# Patient Record
Sex: Male | Born: 1970 | Race: White | Hispanic: No | Smoking: Former smoker
Health system: Southern US, Community
[De-identification: ages and names within clinical notes are randomized; demographics above are authoritative.]

## PROBLEM LIST (undated history)

## (undated) DIAGNOSIS — J45909 Unspecified asthma, uncomplicated: Secondary | ICD-10-CM

## (undated) DIAGNOSIS — F32A Depression, unspecified: Secondary | ICD-10-CM

## (undated) DIAGNOSIS — K219 Gastro-esophageal reflux disease without esophagitis: Secondary | ICD-10-CM

## (undated) DIAGNOSIS — M1990 Unspecified osteoarthritis, unspecified site: Secondary | ICD-10-CM

## (undated) DIAGNOSIS — F419 Anxiety disorder, unspecified: Secondary | ICD-10-CM

## (undated) DIAGNOSIS — G4733 Obstructive sleep apnea (adult) (pediatric): Secondary | ICD-10-CM

## (undated) DIAGNOSIS — F99 Mental disorder, not otherwise specified: Secondary | ICD-10-CM

## (undated) DIAGNOSIS — K649 Unspecified hemorrhoids: Secondary | ICD-10-CM

## (undated) DIAGNOSIS — G4700 Insomnia, unspecified: Secondary | ICD-10-CM

## (undated) DIAGNOSIS — M549 Dorsalgia, unspecified: Secondary | ICD-10-CM

## (undated) DIAGNOSIS — R011 Cardiac murmur, unspecified: Secondary | ICD-10-CM

## (undated) DIAGNOSIS — G709 Myoneural disorder, unspecified: Secondary | ICD-10-CM

## (undated) DIAGNOSIS — F329 Major depressive disorder, single episode, unspecified: Secondary | ICD-10-CM

## (undated) DIAGNOSIS — I10 Essential (primary) hypertension: Secondary | ICD-10-CM

## (undated) HISTORY — PX: SPINE SURGERY: SHX786

## (undated) HISTORY — DX: Cardiac murmur, unspecified: R01.1

## (undated) HISTORY — PX: HEMORRHOID SURGERY: SHX153

## (undated) HISTORY — PX: BACK SURGERY: SHX140

## (undated) HISTORY — DX: Unspecified asthma, uncomplicated: J45.909

## (undated) HISTORY — PX: TONSILLECTOMY: SUR1361

---

## 2004-10-18 ENCOUNTER — Encounter: Admission: RE | Admit: 2004-10-18 | Discharge: 2004-10-18 | Payer: Self-pay | Admitting: Neurosurgery

## 2006-09-19 ENCOUNTER — Emergency Department (HOSPITAL_COMMUNITY): Admission: EM | Admit: 2006-09-19 | Discharge: 2006-09-19 | Payer: Self-pay | Admitting: Emergency Medicine

## 2006-09-20 ENCOUNTER — Other Ambulatory Visit (HOSPITAL_COMMUNITY): Admission: RE | Admit: 2006-09-20 | Discharge: 2006-12-19 | Payer: Self-pay | Admitting: Psychiatry

## 2006-09-26 ENCOUNTER — Ambulatory Visit: Payer: Self-pay | Admitting: Psychiatry

## 2006-10-12 ENCOUNTER — Encounter: Admission: RE | Admit: 2006-10-12 | Discharge: 2006-10-12 | Payer: Self-pay | Admitting: Neurosurgery

## 2006-10-28 ENCOUNTER — Emergency Department: Payer: Self-pay | Admitting: Emergency Medicine

## 2006-12-28 ENCOUNTER — Ambulatory Visit: Admission: RE | Admit: 2006-12-28 | Discharge: 2006-12-28 | Payer: Self-pay | Admitting: Neurosurgery

## 2007-01-07 ENCOUNTER — Ambulatory Visit: Payer: Self-pay | Admitting: Cardiovascular Disease

## 2007-01-07 ENCOUNTER — Ambulatory Visit: Payer: Self-pay | Admitting: Internal Medicine

## 2007-02-05 ENCOUNTER — Inpatient Hospital Stay (HOSPITAL_COMMUNITY): Admission: RE | Admit: 2007-02-05 | Discharge: 2007-02-08 | Payer: Self-pay | Admitting: Neurosurgery

## 2007-08-11 ENCOUNTER — Emergency Department: Payer: Self-pay | Admitting: Emergency Medicine

## 2007-08-12 ENCOUNTER — Other Ambulatory Visit: Payer: Self-pay

## 2008-06-12 ENCOUNTER — Encounter: Admission: RE | Admit: 2008-06-12 | Discharge: 2008-06-12 | Payer: Self-pay | Admitting: Neurosurgery

## 2008-06-14 ENCOUNTER — Encounter: Admission: RE | Admit: 2008-06-14 | Discharge: 2008-06-14 | Payer: Self-pay | Admitting: Neurosurgery

## 2009-03-31 ENCOUNTER — Emergency Department (HOSPITAL_COMMUNITY): Admission: EM | Admit: 2009-03-31 | Discharge: 2009-04-01 | Payer: Self-pay | Admitting: Emergency Medicine

## 2009-04-01 ENCOUNTER — Ambulatory Visit: Payer: Self-pay | Admitting: Psychiatry

## 2009-04-01 ENCOUNTER — Inpatient Hospital Stay (HOSPITAL_COMMUNITY): Admission: RE | Admit: 2009-04-01 | Discharge: 2009-04-05 | Payer: Self-pay | Admitting: Psychiatry

## 2010-04-27 ENCOUNTER — Emergency Department (HOSPITAL_COMMUNITY): Admission: EM | Admit: 2010-04-27 | Discharge: 2010-04-30 | Payer: Self-pay | Admitting: Emergency Medicine

## 2010-10-30 ENCOUNTER — Encounter: Payer: Self-pay | Admitting: Neurosurgery

## 2010-10-31 ENCOUNTER — Encounter: Payer: Self-pay | Admitting: Neurosurgery

## 2010-12-24 LAB — CBC
Hemoglobin: 13.3 g/dL (ref 13.0–17.0)
MCH: 30.4 pg (ref 26.0–34.0)
MCHC: 34 g/dL (ref 30.0–36.0)
Platelets: 266 10*3/uL (ref 150–400)

## 2010-12-24 LAB — POCT CARDIAC MARKERS
CKMB, poc: 2 ng/mL (ref 1.0–8.0)
Myoglobin, poc: 66.7 ng/mL (ref 12–200)

## 2010-12-24 LAB — COMPREHENSIVE METABOLIC PANEL
ALT: 25 U/L (ref 0–53)
AST: 23 U/L (ref 0–37)
Albumin: 4.1 g/dL (ref 3.5–5.2)
CO2: 24 mEq/L (ref 19–32)
Calcium: 9.2 mg/dL (ref 8.4–10.5)
Creatinine, Ser: 1.16 mg/dL (ref 0.4–1.5)
GFR calc Af Amer: >60 mL/min (ref >60)
Sodium: 137 mEq/L (ref 135–145)
Total Protein: 7 g/dL (ref 6.0–8.3)

## 2010-12-24 LAB — RAPID URINE DRUG SCREEN, HOSP PERFORMED
Amphetamines: NOT DETECTED
Barbiturates: NOT DETECTED
Benzodiazepines: NOT DETECTED
Tetrahydrocannabinol: POSITIVE — AB

## 2010-12-24 LAB — DIFFERENTIAL
Eosinophils Absolute: 0.4 10*3/uL (ref 0.0–0.7)
Eosinophils Relative: 4 % (ref 0–5)
Lymphocytes Relative: 27 % (ref 12–46)
Lymphs Abs: 2.2 10*3/uL (ref 0.7–4.0)
Monocytes Absolute: 0.7 10*3/uL (ref 0.1–1.0)
Monocytes Relative: 9 % (ref 3–12)

## 2010-12-24 LAB — ETHANOL: Alcohol, Ethyl (B): 5 mg/dL (ref 0–10)

## 2011-01-16 LAB — DIFFERENTIAL
Lymphocytes Relative: 26 % (ref 12–46)
Monocytes Absolute: 0.7 10*3/uL (ref 0.1–1.0)
Monocytes Relative: 8 % (ref 3–12)
Neutro Abs: 5.4 10*3/uL (ref 1.7–7.7)

## 2011-01-16 LAB — RAPID URINE DRUG SCREEN, HOSP PERFORMED
Opiates: NOT DETECTED
Tetrahydrocannabinol: NOT DETECTED

## 2011-01-16 LAB — HEPATIC FUNCTION PANEL
ALT: 40 U/L (ref 0–53)
Alkaline Phosphatase: 48 U/L (ref 39–117)
Bilirubin, Direct: <0.1 mg/dL (ref 0.0–0.3)
Total Bilirubin: 0.2 mg/dL — ABNORMAL LOW (ref 0.3–1.2)

## 2011-01-16 LAB — URINE MICROSCOPIC-ADD ON

## 2011-01-16 LAB — POCT I-STAT, CHEM 8
BUN: 21 mg/dL (ref 6–23)
Calcium, Ion: 0.94 mmol/L — ABNORMAL LOW (ref 1.12–1.32)
Chloride: 104 mEq/L (ref 96–112)
Creatinine, Ser: 1.4 mg/dL (ref 0.4–1.5)
Glucose, Bld: 116 mg/dL — ABNORMAL HIGH (ref 70–99)

## 2011-01-16 LAB — URINALYSIS, ROUTINE W REFLEX MICROSCOPIC
Glucose, UA: NEGATIVE mg/dL
Hgb urine dipstick: NEGATIVE
Protein, ur: 100 mg/dL — AB

## 2011-01-16 LAB — CBC
HCT: 43.7 % (ref 39.0–52.0)
Hemoglobin: 15.2 g/dL (ref 13.0–17.0)
RBC: 4.93 MIL/uL (ref 4.22–5.81)

## 2011-01-16 LAB — ETHANOL: Alcohol, Ethyl (B): 75 mg/dL — ABNORMAL HIGH (ref 0–10)

## 2011-02-21 NOTE — H&P (Signed)
NAMEHUIE, GHUMAN              ACCOUNT NO.:  [id number]   MEDICAL RECORD NO.:  [id number]          PATIENT TYPE:  IPS   LOCATION:  0503                          FACILITY:  [facility]   PHYSICIAN:  Geoffery Lyons, M.D.      DATE OF BIRTH:  [birth year]   DATE OF ADMISSION:  04/01/2009  DATE OF DISCHARGE:                       PSYCHIATRIC ADMISSION ASSESSMENT   A 40 year old male voluntarily admitted on March 31, 2009.   HISTORY OF PRESENT ILLNESS:  The patient reports wanting to get help  from his alcohol and cocaine use.  He had some vague and passive  suicidal thoughts.  He is unable to recall his longest history of  sobriety.  He states he has tried to detox on his own, but it was  miserable.   PAST PSYCHIATRIC HISTORY:  First admission to [facility].  No  prior detox.  The patient is hoping to go to a long-term program.   SOCIAL HISTORY:  This is a 40 year old married male.  He is unemployed.  Apparently wife had asked him to leave due to his substance use and some  apparent financial problems.   FAMILY HISTORY:  Unknown at this time.   ALCOHOL/DRUG HISTORY:  Again, the patient has been drinking.  Drinking  approximately a pint to some liters of wine and using cocaine.   PRIMARY CARE Manda Holstad:  Dr. Anna Genre.   MEDICAL PROBLEMS:  1. Hypertension.  2. Chronic pain.   MEDICATIONS:  1. Lisinopril.  2. Prozac 20 mg daily.  3. Percocet for pain.   DRUG ALLERGIES:  NO KNOWN ALLERGIES.   PHYSICAL EXAMINATION:  GENERAL:  This is a middle-aged male fully  assessed at [facility] Emergency Department.  ER records state that  patient was agitated.  He did receive some Ativan.  Also noted that the  patient apparently was threatening and violent.  A friend had called the  police.  Was taken to the [facility] and then transported to the  emergency room.  VITAL SIGNS:  98.4, 109 heart rate, blood pressure 142/91.   LABORATORY DATA:  Alcohol level of 75.  CBC within normal  limits.  Urine  drug screen is positive for cocaine.  Sodium of 133, potassium of 3.2.   MENTAL STATUS EXAM:  The patient is in bed.  Poor eye contact.  He is  dressed in scrubs, irritable.  The patient is trying to sleep.  Speech  is clear.  The patient's mood is tired.  As stated, the patient is  irritable, wanting to sleep, but nonthreatening or hostile.  Thought  processes are coherent and goal directed.  No delusional statements.  Cognitive function intact.  Memory appears intact.  Judgment and insight  are fair.   AXIS I:  Polysubstance abuse, mood disorder not otherwise specified.  AXIS II:  Deferred.  AXIS III:  Hypertension, chronic pain.  AXIS IV:  Medical problems, economic issues, possible occupation  problems and  problems with primary support.  AXIS V:  Current is 45.   PLAN:  Our plan is to detox the patient with the Librium protocol.  Work  on  relapse prevention. Case manager will assess long-term rehab which  the patient is interested in.  We will discontinue his Percocet at this  time and offer Motrin for pain.  The patient will be in the red group.  Tentative length of stay at this time is 3-4 days.      Landry Corporal, N.P.      Geoffery Lyons, M.D.  Electronically Signed    JO/MEDQ  D:  04/01/2009  T:  04/01/2009  Job:  [id number]

## 2011-02-24 NOTE — Discharge Summary (Signed)
Bryan Huffman, Bryan Huffman              ACCOUNT NO.:  [id number]   MEDICAL RECORD NO.:  [id number]          PATIENT TYPE:  IPS   LOCATION:  0503                          FACILITY:  [facility]   PHYSICIAN:  Geoffery Lyons, M.D.      DATE OF BIRTH:  1971-05-05   DATE OF ADMISSION:  04/01/2009  DATE OF DISCHARGE:  04/05/2009                               DISCHARGE SUMMARY   CHIEF COMPLAINT/HISTORY OF PRESENT ILLNESS:  This was the first  admission to [facility] for this 40 year old male  voluntarily admitted.  He reported wanting to get help for his alcohol  and cocaine use.  He had some vague and passive thoughts of suicide,  unable to recall his longest history of sobriety.  He tried to detox  from home but he said he was miserable.   PAST PSYCHIATRIC HISTORY:  History of first time at [facility].  No prior detox.  Hoping to go to a long-term program.  He endorsed he  has been drinking a pint to some liters of wine and using cocaine.   PAST MEDICAL HISTORY:  Hypertension.   MEDICATIONS:  1. Chronic pain medication.  2. Lisinopril.  3. Prozac 20 mg per day.  4. Percocet for pain.   Physical exam failed to show any acute findings.   LABORATORY WORKUP:  SGOT 23, SGPT 40, total bilirubin 0.2.  Alcohol  level 75.  CBC within normal limits.  UDS positive for cocaine.  Sodium  133, potassium 3.2.   PHYSICAL EXAMINATION:  GENERAL:  Reveals alert, cooperative male, poor  eye contact, somewhat irritable, trying to sleep.  Speech is clear,  normal rate, tempo and production.  Mood tired.  Affect irritable,  wanting to sleep.  Thought processes logical, coherent and relevant.  __________  spontaneous.  Continued to endorse he wanted help.  No  suicidal or homicidal ideas, no delusions.  No hallucinations.  Cognition well-preserved.   DIAGNOSES:  AXIS I:  1. Alcohol dependence.  2. Cocaine dependence.  3. Mood disorder, not otherwise specified.Marland Kitchen  AXIS II: No diagnosis.  AXIS III:  1. Hypertension.  2. Chronic pain.  AXIS IV: Moderate.  AXIS V:  On admission 35, highest GAF in the last year 60.   He again was on Zestril 10 mg per day, Prozac 20 mg per day, and  Percocet 1-2 325 every hour as needed for pain.  He claimed he went out  on the beach with alcohol as well as crack cocaine.  During an argument  with his wife police were called.   PAST SURGICAL HISTORY:  Had two back surgeries.   SOCIAL HISTORY:  He is a Archivist.  He used to work in the [employer] and transferred to another division where he had to use his  back that has made things worse for him.  He endorsed that the wife was  leaving  as a result of his alcohol and cocaine use.  He wanted to go to  residential treatment facility.  On June 25 he was having a very hard  time with his  mood, with dealing with a lot of anxiety and depression  and getting __________with the shame and guilt for what he has done to  his family.  That is the lying, the deceiving, the manipulation.  He  endorsed that he does not know what happened to him.  He used to be so  happy and that he is reduced to this.  We used Seroquel and Neurontin  with the anxiety and the agitation.  Sleep was an issue as well as mild  hand tremor.  We continued to work on Pharmacologist, relapse prevention.  Reasons were made for him to be admitted to [facility] of [place],  [place].  He was motivated to this.  His mood improved.  His affect was  brighter.  He was committed to abstinence so on June 28 he was  discharged to go to [place], [place] to further work on his recovery.   DISCHARGE DIAGNOSES:  AXIS I:  1. Alcohol and cocaine dependence.  2. Mood disorder, not otherwise specified.  AXIS II: No diagnosis.  AXIS III:  1. Severe hypertension.  2. Chronic pain.  AXIS IV: Moderate.  AXIS V:  Upon discharge 50-55.   MEDICATIONS:  1. Discharged on Prozac 20 mg per day.  2. Seroquel 50 mg three times a day and 100  at bedtime.  3. Neurontin 300 mg four times a day.  4. Zestril 20 mg per day.   FOLLOWUP:  Follow up through [facility] in [place], M.D.  Electronically Signed     IL/MEDQ  D:  05/05/2009  T:  05/05/2009  Job:  [id number]

## 2011-02-24 NOTE — Letter (Signed)
January 07, 2007    Payton Doughty, M.D.  [address], [address]  [place], [place] [address]   RE:  Bryan Huffman, Bryan Huffman  MRN:  [id number]  /  DOB:  1971/06/18   Dear Loraine Leriche:   Today, I saw Mr. 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  The patient  denies any history of palpitations or heart racing and has never had  syncope.  He does have chronic lower back pain and note your plans for  pending surgery.   I have discussed the issues of back surgery with the patient in detail.  Because he is asymptomatic with regard to his accessory pathway and WPW  syndrome, I have not recommended anything more than beta-blocker  therapy.  He will be given a prescription today for Lopressor 25 mg  twice daily which he is instructed to take for approximately 1 week from  today before undergoing back surgery.  Once this has been carried out  and, if need be, 5 days alone would be enough, then it would be okay to  proceed with lower back surgery with the reinitiation of his beta-  blocker therapy once he is able to take p.o.  There is a small chance  that he might develop atrial fibrillation and a rapid ventricular  response from his WPW syndrome and  if he did, cardiology consultation, specifically electrophysiology  consultation would be warranted.  My expectation, however, is that he  will do well particularly provided that he is maintained on his beta-  blocker therapy.  Do not hesitate to contact me should you have any  additional questions regarding Mr. Angerer perioperative evaluation or  treatment with this WPW syndrome.    Sincerely,      Doylene Canning. Ladona Ridgel, MD  Electronically Signed    GWT/MedQ  DD: 01/07/2007  DT:  01/08/2007  Job #: [id number]

## 2011-02-24 NOTE — H&P (Signed)
Bryan Huffman, Bryan Huffman              ACCOUNT NO.:  [id number]   MEDICAL RECORD NO.:  [id number]          PATIENT TYPE:  INP   LOCATION:  2899                         FACILITY:  [facility]   PHYSICIAN:  Payton Doughty, M.D.      DATE OF BIRTH:  08/21/1971   DATE OF ADMISSION:  02/05/2007  DATE OF DISCHARGE:                              HISTORY & PHYSICAL   ADMITTING DIAGNOSIS:  Lumbar spondylosis L3-4.   HISTORY OF PRESENT ILLNESS:  A 40 year old right-handed white gentleman  who in 1998 underwent L4-5 fusion by Dr. Renae Fickle for spondylosis with a  slip.  He did well since then.  Starting in September, experiencing low  back pain radiating into his right hip and buttock, worse on the right,  getting into the left.  Bowel and bladder are not noted to be a  difficulty.  He has been dealing with this for a couple of years.  Repeat MR shows worsening stenosis at L3-4, the level above his fusion.  He is now admitted here for extension of his fusion up from 4-5 to 3-4.   PAST MEDICAL HISTORY:  Remarkable for hypertension.   MEDICATIONS:  He takes Vicodin on a p.r.n. basis, as well as lisinopril.   ALLERGIES:  BEE STINGS.   PAST SURGICAL HISTORY:  His only operations are in his back.   SOCIAL HISTORY:  He smokes a pack of cigarettes per day.  Drinks alcohol  on a limited social basis.  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   FAMILY HISTORY:  Mom and dad are both in their mid 65s in good health.   REVIEW OF SYSTEMS:  Remarkable for nasal congestion, hypertension, back  pain, and leg pain.  HEENT EXAM:  Within normal limits.  He has  reasonable range of motion in his neck.  CHEST:  Clear.  CARDIAC EXAM:  Regular rate and rhythm.  ABDOMEN:  Nontender.  No hepatosplenomegaly.  EXTREMITIES:  Without clubbing or cyanosis.  GU EXAM:  Deferred.  Peripheral pulses are good.  NEUROLOGICALLY:  He is awake, alert, and  oriented.  His cranial nerves are intact.  Motor exam shows 5/5 strength  throughout to upper and lower extremities.  No current sensory deficit.  Sensory described as an S1 distribution with mild L5 component.  Reflexes are absent at the knees, one at the left ankle, absent at the  right ankle.  Straight-leg raise is negative.  CT demonstrates good  fusion in 4-5.  On his MR, is shows tight stenosis at 3-4 with  significant degenerative disease at facets.   CLINICAL IMPRESSION:  Significant lumbar spondylosis and transitional  syndrome at L3-4.   PLAN:  Plan is for extension of fusion up to L3-4.  Will place cages at  that level as well.  The risks and benefits of this approach have been  discussed with him and he wishes to proceed.           ______________________________  Payton Doughty, M.D.     MWR/MEDQ  D:  02/05/2007  T:  02/05/2007  Job:  [id number]

## 2011-02-24 NOTE — Discharge Summary (Signed)
NAMEMAXAMUS, COLAO              ACCOUNT NO.:  [id number]   MEDICAL RECORD NO.:  [id number]          PATIENT TYPE:  INP   LOCATION:  3023                         FACILITY:  [facility]   PHYSICIAN:  Payton Doughty, M.D.      DATE OF BIRTH:  November 01, 1970   DATE OF ADMISSION:  02/05/2007  DATE OF DISCHARGE:  02/08/2007                               DISCHARGE SUMMARY   ADMISSION DIAGNOSIS:  Spondylosis L3-4.   DISCHARGE DIAGNOSIS:  Spondylosis L3-4.   PROCEDURE:  L3-4, L4-5 extension fusion from L4-5 up to L3-4.   COMPLICATIONS:  None.   HISTORY OF PRESENT ILLNESS:  A 40 year old gentleman whose history and  physical was reviewed in the chart.  He has had prior fusion 4-5, has  progressive disease at 3-4.  Medical history is otherwise benign.   PHYSICAL EXAMINATION:  GENERAL:  Intact.  NEUROLOGIC:  Intact with L4 radiculopathy.   HOSPITAL COURSE:  He was admitted after ascertaining abnormal laboratory  values and underwent a fusion at 3-4 and extension of his fusion up from  4-5.  Postoperatively he has done well.  Foley was removed the first  day.  PCA was stopped the second day.  It is now the third postoperative  day.  He is being discharged home in the care of his family with valium  for spasm, Percocet for pain, and 5 days of ciprofloxacin.   FOLLOWUP:  His followup will be with [facility] in  [facility] office in 1 week.           ______________________________  Payton Doughty, M.D.     MWR/MEDQ  D:  02/08/2007  T:  02/09/2007  Job:  [id number]

## 2011-02-24 NOTE — Letter (Signed)
January 02, 2007    Payton Doughty, M.D.  [address]  [address]  [place], [place] [address]   RE:  MACKLIN, JACQUIN  MRN:  [id number]  /  DOB:  15-Mar-1971   Dear Dr. Channing Mutters:   It was my pleasure to see Nivan Melendrez at the [facility]  [facility] on January 02, 2007.  He is a 40 year old male who was referred for  evaluation of an abnormal EKG.   Mr. Dilauro has no history of cardiac problems.  He was undergoing  routine preoperative evaluation for upcoming lumbar back surgery, and  was found to have an EKG pattern suggestive of Wolff-Parkinson-White  syndrome.  He does not recall ever having an EKG performed in the past.  He has never been told of this diagnosis.  He has never been told of any  heart problems.  He has been active gentleman until his back problems,  and has never had any chest pain, dyspnea, lightheadedness, syncope,  palpitations, orthopnea, PND, edema, or other cardiac problems.  Other  than back pain with some radiation to the legs, he has no symptomatic  complaints at present.   PAST MEDICAL HISTORY:  Pertinent for the following:  1. Essential hypertension.  2. Chronic low back pain.  3. Lumbar fusion in 1998.   No other chronic medical problems.   CURRENT MEDICATIONS:  Include:  1. Lisinopril 10 mg daily.  2. Percocet 10/650 mg as needed.  3. Over-the-counter sleep medication as needed.  4. Multivitamin daily.   ALLERGIES:  NO KNOWN DRUG ALLERGIES.   SOCIAL HISTORY:  The patient is married.  He has 1 child.  He works as a  Investment banker, corporate.  He smokes 1 pack of cigarettes over the last 10  years.  He has used illicit drugs in the past, but not presently.  He  drinks a few alcoholic drinks every week.  He does not exercise  regularly.   FAMILY HISTORY:  There is no family history of coronary artery disease  or sudden death in the family.   REVIEW OF SYSTEMS:  Complete 12-point review of systems was performed.  All systems were negative except as  detailed above.   PHYSICAL EXAMINATION:  The patient is alert and oriented.  He is in no  acute distress.  Weight is 155 pounds.  Blood pressure is 130/79 in the right arm, 144/85  in the left arm.  Heart rate is 73.  Respiratory rate 16.  HEENT:  Normal.  NECK:  Normal carotid upstrokes without bruits.  Jugular venous pressure  is normal.  There is no thyromegaly or thyroid nodules.  LUNGS:  Clear to auscultation bilaterally.  HEART:  The apex is discrete and nondisplaced.  Heart is regular rate  and rhythm without murmurs or gallops.  ABDOMEN:  Soft, non-tender.  No organomegaly.  No abdominal bruits.  BACK:  There is no flank tenderness.  EXTREMITIES:  No clubbing, cyanosis, or edema.  Peripheral pulses are 2+  and equal throughout.  SKIN:  Warm and dry without rash.  NEUROLOGIC:  Cranial nerves 2 through 12 are intact.  Strength is 5/5  and equal in the arms and legs bilaterally.   EKG dated December 28, 2006 demonstrates normal sinus rhythm with typical  features of ventricular preexcitation with a delta wave and short PR  interval.  EKG performed in our office shows normal sinus rhythm with  classic Wolff-Parkinson-White pattern.  There is an R wave in V1  along  with delta waves and the short PR interval.   ASSESSMENT:  Mr. Jacob is a 40 year old gentleman for upcoming  repeat lumbar fusion surgery for chronic back problems.  He clearly  meets criteria for Wolff-Parkinson-White syndrome based on his classic  ECG.  He has never had any symptomatic arrhythmias or cardiac problems  for that matter.  I think his risk of undergoing general anesthesia for  his upcoming surgery is very low.  A question of whether he should have  a formal electrophysiology evaluation since ablation procedures can  provide definitive cure of WPW as well as reduce the risk of future  arrhythmias.  I will review this with either Dr. Ladona Ridgel or Dr. Graciela Husbands  prior his final clearance for surgery.  I will be  in contact with you as  soon as I am able to do this, but should be able to clear him within the  week.   Dr. Channing Mutters, thanks again for allowing me to see Mr. Roskos.  I will be  in touch with you in the next few days to give you any final  recommendations.  Mr. Mangrum' Lisinopril was refilled, as he has lost  his primary care physician.  We will give him a primary care referral as  well.    Sincerely,      Veverly Fells. Excell Seltzer, MD  Electronically Signed    MDC/MedQ  DD: 01/02/2007  DT: 01/02/2007  Job #: [id number]

## 2011-02-24 NOTE — Assessment & Plan Note (Signed)
[facility]                         ELECTROPHYSIOLOGY OFFICE NOTE   JAYSHAWN, COLSTON                     MRN:          [id number]  DATE:01/07/2007                            DOB:          [birth year]    Mr. Byers was referred today by Dr. Trey Sailors secondary to WPW  syndrome.   HISTORY OF PRESENT ILLNESS:  Patient is a 40 year old man who has had  fairly good health from a cardiac perspective.  He smokes cigarettes and  drinks alcoholic beverages, although not in excess.  The patient has no  significant palpitations and denies any history of rapid heart racing.  He does have problems with his lumbar and sacral spine and was scheduled  for an operation last week when he was subsequently found to have an  abnormal EKG with clear cut WPW syndrome.  As noted before, however, the  patient has had no symptoms denying syncope or other problems.  He  subsequently was initially seen by Dr. Calton Dach.  He is now referred  for additional evaluation.   His additional past medical history is notable for prior lumbar fusion  in 1998.   SOCIAL HISTORY:  The patient smokes one pack of cigarettes per day.  He  drinks 1-2 alcoholic beverages per day.  Works as a Archivist.   FAMILY HISTORY:  Notable for both parents being alive and well.  His  father does have some coronary disease.   Medications include Percocet, lisinopril, and over-the-counter sleep  aids.   Review of systems is notable for problems with chronic back pain for  many years.  He also complains of depression and anxiety and has  problems with gastroesophageal reflux disease and seasonal allergic  rhinitis.  He denies cough or hemoptysis.  The rest of his review of  systems was negative, except as noted above.   PHYSICAL EXAMINATION:  GENERAL:  He is a pleasant, well-appearing 11-  year-old man in no acute distress.  VITAL SIGNS:  Blood pressure today was 140/80.  Pulse was 73  and  regular.  Respirations were 18.  The weight was 155 pounds.  HEENT:  Normocephalic and atraumatic.  Pupils are equal and round.  Oropharynx was moist.  Sclerae are anicteric.  NECK:  No jugular venous distention.  There was no thyromegaly.  Trachea  is midline.  The carotids are 2+ and symmetric.  LUNGS:  Clear bilaterally to auscultation.  There are no wheezes, rales  or rhonchi.  CARDIOVASCULAR:  Regular rate and rhythm with a normal S1 and S2.  EXTREMITIES:  No edema.  ABDOMEN:  Soft, nontender, nondistended.  There is no organomegaly.  JOINTS:  Normal.  NEUROLOGIC:  Alert and oriented x3 with cranial nerves intact.  The  strength is 5/5 and symmetric.   EKG demonstrates sinus rhythm with left lateral accessory pathway and  WPW syndrome.   IMPRESSION:  1. Asymptomatic Wolff-Parkinson-White syndrome.  2. Borderline hypertension.   DISCUSSION:  The risk of the surgery is low for Mr. Mickeal Needy.  Because  he is asymptomatic from his WPW syndrome, I have recommended a period of  watchful waiting.  If he were to develop supraventricular tachycardia or  atrial fibrillation, then he would be a very good candidate for catheter  ablation therapy.     Doylene Canning. Ladona Ridgel, MD  Electronically Signed    GWT/MedQ  DD: 01/07/2007  DT: 01/08/2007  Job #: [id number]   cc:   Payton Doughty, M.D.

## 2011-02-24 NOTE — Op Note (Signed)
[name], Bryan Huffman              ACCOUNT NO.:  [id number]   MEDICAL RECORD NO.:  [id number]          PATIENT TYPE:  INP   LOCATION:  3023                         FACILITY:  [facility]   PHYSICIAN:  Payton Doughty, M.D.      DATE OF BIRTH:  [birth year]   DATE OF PROCEDURE:  02/05/2007  DATE OF DISCHARGE:                               OPERATIVE REPORT   PREOPERATIVE DIAGNOSIS:  Spondylosis L3-4.   POSTOPERATIVE DIAGNOSIS:  Spondylosis L3-4.   OPERATIVE PROCEDURE:  L3-4 laminectomy, diskectomy, posterior lumbar  antibody fusion with Ray threaded fusion cage, segmental pedicle screw  fixation from L3-L5 and posterolateral arthrodesis L3-4.   ANESTHESIA:  General endotracheal.   PREP:  Sterile Betadine prep and scrub with alcohol wipe.   COMPLICATIONS:  None.   NURSE ASSISTANT:  [place].   DOCTOR ASSISTANT:  Hewitt Shorts, M.D.   BODY OF TEXT:  This is a 40 year old gentleman with severe lumbar  spondylosis L3-4 which is a level above an old fusion at L4-5.  Taken  operating room, smoothly anesthetized, intubated, placed prone on the  operating table following shave, prep, draped in usual sterile fashion.  The skin was incised from mid L2 to the bottom of L4.  The lamina and  transverse process of L3 was exposed as well as the old hardware at L4-  5.  It was uncovered and removed.  It was replaced with 90 D pedicle  screws.  The lamina, pars interarticularis and inferior facet of L3 and  superior facet of L4 removed using a high-speed drill and L3 roots  decompressed.  The right-sided root was quite large and was not feasible  to be mobilized.  The left-sided root was more with normal.  On the left  side, a Ray threaded fusion cage was placed 12 x 21 mm packed with bone  graft harvested from the facet joint.  The L3 pedicle was then tapped  and pedicle screws placed.  The intraoperative x-ray showed good  placement screws.  It was connected to the rods.  The transverse  processes were decorticated with a high-speed drill and packed with bone  graft harvested from facet joints as well as BMP in the extender matrix.  Intraoperative film showed good placement of all hardware.  Successive  layers of 0-0 Vicryl, 2-0 Vicryl and 3-0 nylon were used to close.  Betadine Telfa dressing was applied and made occlusive with OpSite.  The  patient returned to recovery room in good condition.           ______________________________  Payton Doughty, M.D.     MWR/MEDQ  D:  02/05/2007  T:  02/06/2007  Job:  [id number]

## 2012-10-10 ENCOUNTER — Ambulatory Visit: Payer: Self-pay | Admitting: Nurse Practitioner

## 2013-05-18 ENCOUNTER — Ambulatory Visit: Payer: Self-pay | Admitting: Nurse Practitioner

## 2013-06-26 ENCOUNTER — Ambulatory Visit: Payer: Self-pay | Admitting: Nurse Practitioner

## 2013-07-25 ENCOUNTER — Encounter (HOSPITAL_COMMUNITY): Payer: Self-pay | Admitting: Emergency Medicine

## 2013-07-25 ENCOUNTER — Encounter (HOSPITAL_COMMUNITY): Payer: Self-pay | Admitting: *Deleted

## 2013-07-25 ENCOUNTER — Emergency Department (HOSPITAL_COMMUNITY)
Admission: EM | Admit: 2013-07-25 | Discharge: 2013-07-25 | Disposition: A | Discharge status: Other Acute Inpatient Hospital | Payer: Medicaid Other | Attending: Emergency Medicine | Admitting: Emergency Medicine

## 2013-07-25 ENCOUNTER — Inpatient Hospital Stay (HOSPITAL_COMMUNITY)
Admission: AD | Admit: 2013-07-25 | Discharge: 2013-07-30 | DRG: 897 | Disposition: A | Discharge status: Home / Self Care | Payer: Medicaid Other | Source: Intra-hospital | Attending: Psychiatry | Admitting: Psychiatry

## 2013-07-25 DIAGNOSIS — T43502A Poisoning by unspecified antipsychotics and neuroleptics, intentional self-harm, initial encounter: Secondary | ICD-10-CM | POA: Insufficient documentation

## 2013-07-25 DIAGNOSIS — I959 Hypotension, unspecified: Secondary | ICD-10-CM

## 2013-07-25 DIAGNOSIS — T50902A Poisoning by unspecified drugs, medicaments and biological substances, intentional self-harm, initial encounter: Secondary | ICD-10-CM

## 2013-07-25 DIAGNOSIS — Z79899 Other long term (current) drug therapy: Secondary | ICD-10-CM

## 2013-07-25 DIAGNOSIS — I10 Essential (primary) hypertension: Secondary | ICD-10-CM | POA: Diagnosis present

## 2013-07-25 DIAGNOSIS — R45851 Suicidal ideations: Secondary | ICD-10-CM | POA: Insufficient documentation

## 2013-07-25 DIAGNOSIS — F1020 Alcohol dependence, uncomplicated: Principal | ICD-10-CM | POA: Diagnosis present

## 2013-07-25 DIAGNOSIS — IMO0002 Reserved for concepts with insufficient information to code with codable children: Secondary | ICD-10-CM | POA: Insufficient documentation

## 2013-07-25 DIAGNOSIS — M549 Dorsalgia, unspecified: Secondary | ICD-10-CM | POA: Diagnosis present

## 2013-07-25 DIAGNOSIS — F329 Major depressive disorder, single episode, unspecified: Secondary | ICD-10-CM

## 2013-07-25 DIAGNOSIS — F17200 Nicotine dependence, unspecified, uncomplicated: Secondary | ICD-10-CM | POA: Insufficient documentation

## 2013-07-25 DIAGNOSIS — R5381 Other malaise: Secondary | ICD-10-CM | POA: Insufficient documentation

## 2013-07-25 DIAGNOSIS — Z791 Long term (current) use of non-steroidal anti-inflammatories (NSAID): Secondary | ICD-10-CM | POA: Insufficient documentation

## 2013-07-25 DIAGNOSIS — T50992A Poisoning by other drugs, medicaments and biological substances, intentional self-harm, initial encounter: Secondary | ICD-10-CM | POA: Insufficient documentation

## 2013-07-25 DIAGNOSIS — T426X2A Poisoning by other antiepileptic and sedative-hypnotic drugs, intentional self-harm, initial encounter: Secondary | ICD-10-CM | POA: Insufficient documentation

## 2013-07-25 DIAGNOSIS — T1491XA Suicide attempt, initial encounter: Secondary | ICD-10-CM

## 2013-07-25 DIAGNOSIS — F332 Major depressive disorder, recurrent severe without psychotic features: Secondary | ICD-10-CM | POA: Diagnosis present

## 2013-07-25 DIAGNOSIS — G4700 Insomnia, unspecified: Secondary | ICD-10-CM | POA: Insufficient documentation

## 2013-07-25 DIAGNOSIS — T465X1A Poisoning by other antihypertensive drugs, accidental (unintentional), initial encounter: Secondary | ICD-10-CM | POA: Insufficient documentation

## 2013-07-25 DIAGNOSIS — T4272XA Poisoning by unspecified antiepileptic and sedative-hypnotic drugs, intentional self-harm, initial encounter: Secondary | ICD-10-CM | POA: Insufficient documentation

## 2013-07-25 DIAGNOSIS — R000 Tachycardia, unspecified: Secondary | ICD-10-CM | POA: Insufficient documentation

## 2013-07-25 DIAGNOSIS — T43294A Poisoning by other antidepressants, undetermined, initial encounter: Secondary | ICD-10-CM | POA: Insufficient documentation

## 2013-07-25 DIAGNOSIS — T426X1A Poisoning by other antiepileptic and sedative-hypnotic drugs, accidental (unintentional), initial encounter: Secondary | ICD-10-CM | POA: Insufficient documentation

## 2013-07-25 HISTORY — DX: Major depressive disorder, single episode, unspecified: F32.9

## 2013-07-25 HISTORY — DX: Mental disorder, not otherwise specified: F99

## 2013-07-25 HISTORY — DX: Essential (primary) hypertension: I10

## 2013-07-25 HISTORY — DX: Insomnia, unspecified: G47.00

## 2013-07-25 HISTORY — DX: Dorsalgia, unspecified: M54.9

## 2013-07-25 HISTORY — DX: Depression, unspecified: F32.A

## 2013-07-25 LAB — COMPREHENSIVE METABOLIC PANEL
ALT: 52 U/L (ref 0–53)
AST: 61 U/L — ABNORMAL HIGH (ref 0–37)
Albumin: 3.4 g/dL — ABNORMAL LOW (ref 3.5–5.2)
Alkaline Phosphatase: 54 U/L (ref 39–117)
BUN: 29 mg/dL — ABNORMAL HIGH (ref 6–23)
CO2: 20 mEq/L (ref 19–32)
Calcium: 8.6 mg/dL (ref 8.4–10.5)
Chloride: 99 mEq/L (ref 96–112)
Creatinine, Ser: 1.22 mg/dL (ref 0.50–1.35)
GFR calc Af Amer: 82 mL/min — ABNORMAL LOW (ref >90)
GFR calc non Af Amer: 71 mL/min — ABNORMAL LOW (ref >90)
Glucose, Bld: 152 mg/dL — ABNORMAL HIGH (ref 70–99)
Potassium: 3.3 mEq/L — ABNORMAL LOW (ref 3.5–5.1)
Sodium: 134 mEq/L — ABNORMAL LOW (ref 135–145)
Total Bilirubin: 0.1 mg/dL — ABNORMAL LOW (ref 0.3–1.2)
Total Protein: 6.4 g/dL (ref 6.0–8.3)

## 2013-07-25 LAB — CBC WITH DIFFERENTIAL/PLATELET
Basophils Absolute: 0 10*3/uL (ref 0.0–0.1)
Basophils Relative: 0 % (ref 0–1)
Eosinophils Absolute: 0 10*3/uL (ref 0.0–0.7)
Eosinophils Relative: 0 % (ref 0–5)
HCT: 37.7 % — ABNORMAL LOW (ref 39.0–52.0)
Hemoglobin: 12.7 g/dL — ABNORMAL LOW (ref 13.0–17.0)
Lymphocytes Relative: 9 % — ABNORMAL LOW (ref 12–46)
Lymphs Abs: 1.4 10*3/uL (ref 0.7–4.0)
MCH: 30.4 pg (ref 26.0–34.0)
MCHC: 33.7 g/dL (ref 30.0–36.0)
MCV: 90.2 fL (ref 78.0–100.0)
Monocytes Absolute: 0.6 10*3/uL (ref 0.1–1.0)
Monocytes Relative: 4 % (ref 3–12)
Neutro Abs: 13.9 10*3/uL — ABNORMAL HIGH (ref 1.7–7.7)
Neutrophils Relative %: 87 % — ABNORMAL HIGH (ref 43–77)
Platelets: 245 10*3/uL (ref 150–400)
RBC: 4.18 MIL/uL — ABNORMAL LOW (ref 4.22–5.81)
RDW: 14.1 % (ref 11.5–15.5)
WBC: 15.9 10*3/uL — ABNORMAL HIGH (ref 4.0–10.5)

## 2013-07-25 LAB — URINALYSIS, ROUTINE W REFLEX MICROSCOPIC
Bilirubin Urine: NEGATIVE
Glucose, UA: 250 mg/dL — AB
Hgb urine dipstick: NEGATIVE
Ketones, ur: NEGATIVE mg/dL
Leukocytes, UA: NEGATIVE
Nitrite: NEGATIVE
Protein, ur: NEGATIVE mg/dL
Specific Gravity, Urine: 1.021 (ref 1.005–1.030)
Urobilinogen, UA: 0.2 mg/dL (ref 0.0–1.0)
pH: 5 (ref 5.0–8.0)

## 2013-07-25 LAB — MAGNESIUM: Magnesium: 2 mg/dL (ref 1.5–2.5)

## 2013-07-25 LAB — RAPID URINE DRUG SCREEN, HOSP PERFORMED
Amphetamines: NOT DETECTED
Barbiturates: NOT DETECTED
Benzodiazepines: NOT DETECTED
Cocaine: NOT DETECTED
Opiates: POSITIVE — AB
Tetrahydrocannabinol: NOT DETECTED

## 2013-07-25 LAB — ETHANOL: Alcohol, Ethyl (B): <11 mg/dL (ref 0–11)

## 2013-07-25 LAB — SALICYLATE LEVEL: Salicylate Lvl: <2 mg/dL — ABNORMAL LOW (ref 2.8–20.0)

## 2013-07-25 LAB — ACETAMINOPHEN LEVEL: Acetaminophen (Tylenol), Serum: <15 ug/mL (ref 10–30)

## 2013-07-25 MED ORDER — NAPROXEN 250 MG PO TABS
250.0000 mg | ORAL_TABLET | Freq: Two times a day (BID) | ORAL | Status: DC
  Administered 2013-07-26 – 2013-07-27 (×3): 250 mg via ORAL
  Filled 2013-07-25 (×6): qty 1

## 2013-07-25 MED ORDER — PANTOPRAZOLE SODIUM 40 MG PO TBEC
40.0000 mg | DELAYED_RELEASE_TABLET | Freq: Every day | ORAL | Status: DC
  Administered 2013-07-26 – 2013-07-30 (×5): 40 mg via ORAL
  Filled 2013-07-25 (×8): qty 1

## 2013-07-25 MED ORDER — ADULT MULTIVITAMIN W/MINERALS CH
1.0000 | ORAL_TABLET | Freq: Every day | ORAL | Status: DC
  Administered 2013-07-27 – 2013-07-30 (×4): 1 via ORAL
  Filled 2013-07-25 (×9): qty 1

## 2013-07-25 MED ORDER — CHLORDIAZEPOXIDE HCL 25 MG PO CAPS
25.0000 mg | ORAL_CAPSULE | Freq: Every day | ORAL | Status: AC
  Administered 2013-07-29: 25 mg via ORAL
  Filled 2013-07-25: qty 1

## 2013-07-25 MED ORDER — METHOCARBAMOL 500 MG PO TABS
500.0000 mg | ORAL_TABLET | Freq: Three times a day (TID) | ORAL | Status: DC | PRN
  Administered 2013-07-25 – 2013-07-29 (×6): 500 mg via ORAL
  Filled 2013-07-25 (×6): qty 1

## 2013-07-25 MED ORDER — HYDROCHLOROTHIAZIDE 25 MG PO TABS: 12.5000 mg | ORAL_TABLET | Freq: Every day | ORAL | Status: DC

## 2013-07-25 MED ORDER — HYDROCHLOROTHIAZIDE 12.5 MG PO CAPS
12.5000 mg | ORAL_CAPSULE | Freq: Every day | ORAL | Status: DC
  Administered 2013-07-26 – 2013-07-30 (×5): 12.5 mg via ORAL
  Filled 2013-07-25 (×7): qty 1

## 2013-07-25 MED ORDER — CHLORDIAZEPOXIDE HCL 25 MG PO CAPS
25.0000 mg | ORAL_CAPSULE | Freq: Three times a day (TID) | ORAL | Status: AC
  Administered 2013-07-27 (×3): 25 mg via ORAL
  Filled 2013-07-25 (×3): qty 1

## 2013-07-25 MED ORDER — VITAMIN B-1 100 MG PO TABS
100.0000 mg | ORAL_TABLET | Freq: Every day | ORAL | Status: DC
  Administered 2013-07-27 – 2013-07-30 (×4): 100 mg via ORAL
  Filled 2013-07-25 (×7): qty 1

## 2013-07-25 MED ORDER — ACETAMINOPHEN 325 MG PO TABS
650.0000 mg | ORAL_TABLET | Freq: Four times a day (QID) | ORAL | Status: DC | PRN
  Administered 2013-07-26: 650 mg via ORAL

## 2013-07-25 MED ORDER — SODIUM CHLORIDE 0.9 % IV BOLUS (SEPSIS)
1000.0000 mL | Freq: Once | INTRAVENOUS | Status: AC
  Administered 2013-07-25: 1000 mL via INTRAVENOUS

## 2013-07-25 MED ORDER — POTASSIUM CHLORIDE CRYS ER 20 MEQ PO TBCR
20.0000 meq | EXTENDED_RELEASE_TABLET | Freq: Once | ORAL | Status: AC
  Administered 2013-07-25: 20 meq via ORAL
  Filled 2013-07-25: qty 1

## 2013-07-25 MED ORDER — NAPROXEN 500 MG PO TABS: 500.0000 mg | ORAL_TABLET | Freq: Two times a day (BID) | ORAL | Status: DC | PRN

## 2013-07-25 MED ORDER — DICYCLOMINE HCL 20 MG PO TABS: 20.0000 mg | ORAL_TABLET | Freq: Four times a day (QID) | ORAL | Status: DC | PRN

## 2013-07-25 MED ORDER — LOPERAMIDE HCL 2 MG PO CAPS: 2.0000 mg | ORAL_CAPSULE | ORAL | Status: AC | PRN

## 2013-07-25 MED ORDER — ALUM & MAG HYDROXIDE-SIMETH 200-200-20 MG/5ML PO SUSP: 30.0000 mL | ORAL | Status: DC | PRN

## 2013-07-25 MED ORDER — CLONIDINE HCL 0.1 MG PO TABS
0.1000 mg | ORAL_TABLET | Freq: Four times a day (QID) | ORAL | Status: AC
  Administered 2013-07-25 – 2013-07-27 (×7): 0.1 mg via ORAL
  Filled 2013-07-25 (×11): qty 1

## 2013-07-25 MED ORDER — MAGNESIUM HYDROXIDE 400 MG/5ML PO SUSP: 30.0000 mL | Freq: Every day | ORAL | Status: DC | PRN

## 2013-07-25 MED ORDER — CLONIDINE HCL 0.1 MG PO TABS
0.1000 mg | ORAL_TABLET | ORAL | Status: AC
  Administered 2013-07-28 – 2013-07-29 (×4): 0.1 mg via ORAL
  Filled 2013-07-25 (×4): qty 1

## 2013-07-25 MED ORDER — NAPROXEN 250 MG PO TABS: 250.0000 mg | ORAL_TABLET | Freq: Two times a day (BID) | ORAL | Status: DC

## 2013-07-25 MED ORDER — TRAZODONE HCL 50 MG PO TABS
50.0000 mg | ORAL_TABLET | Freq: Every evening | ORAL | Status: DC | PRN
  Administered 2013-07-25 – 2013-07-30 (×7): 50 mg via ORAL
  Filled 2013-07-25 (×4): qty 1
  Filled 2013-07-25: qty 28
  Filled 2013-07-25 (×3): qty 1

## 2013-07-25 MED ORDER — CHLORDIAZEPOXIDE HCL 25 MG PO CAPS: 25.0000 mg | ORAL_CAPSULE | Freq: Four times a day (QID) | ORAL | Status: AC | PRN

## 2013-07-25 MED ORDER — HYDROXYZINE HCL 25 MG PO TABS
25.0000 mg | ORAL_TABLET | Freq: Four times a day (QID) | ORAL | Status: AC | PRN
  Administered 2013-07-27 – 2013-07-28 (×2): 25 mg via ORAL
  Filled 2013-07-25 (×3): qty 1

## 2013-07-25 MED ORDER — IBUPROFEN 800 MG PO TABS
800.0000 mg | ORAL_TABLET | Freq: Once | ORAL | Status: AC
  Administered 2013-07-25: 800 mg via ORAL
  Filled 2013-07-25: qty 1

## 2013-07-25 MED ORDER — ONDANSETRON 4 MG PO TBDP: 4.0000 mg | ORAL_TABLET | Freq: Four times a day (QID) | ORAL | Status: AC | PRN

## 2013-07-25 MED ORDER — CHLORDIAZEPOXIDE HCL 25 MG PO CAPS
25.0000 mg | ORAL_CAPSULE | ORAL | Status: AC
  Administered 2013-07-28 (×2): 25 mg via ORAL
  Filled 2013-07-25 (×2): qty 1

## 2013-07-25 MED ORDER — PANTOPRAZOLE SODIUM 40 MG PO TBEC: 40.0000 mg | DELAYED_RELEASE_TABLET | Freq: Every day | ORAL | Status: DC

## 2013-07-25 MED ORDER — NAPROXEN SODIUM 220 MG PO TABS: 220.0000 mg | ORAL_TABLET | Freq: Two times a day (BID) | ORAL | Status: DC

## 2013-07-25 MED ORDER — ONDANSETRON 4 MG PO TBDP: 4.0000 mg | ORAL_TABLET | Freq: Four times a day (QID) | ORAL | Status: DC | PRN

## 2013-07-25 MED ORDER — CLONIDINE HCL 0.1 MG PO TABS
0.1000 mg | ORAL_TABLET | Freq: Every day | ORAL | Status: DC
  Administered 2013-07-30: 0.1 mg via ORAL
  Filled 2013-07-25 (×2): qty 1

## 2013-07-25 MED ORDER — HYDROXYZINE HCL 25 MG PO TABS: 25.0000 mg | ORAL_TABLET | Freq: Four times a day (QID) | ORAL | Status: DC | PRN

## 2013-07-25 MED ORDER — LISINOPRIL 20 MG PO TABS: 30.0000 mg | ORAL_TABLET | Freq: Every day | ORAL | Status: DC

## 2013-07-25 MED ORDER — LOPERAMIDE HCL 2 MG PO CAPS: 2.0000 mg | ORAL_CAPSULE | ORAL | Status: DC | PRN

## 2013-07-25 MED ORDER — CHLORDIAZEPOXIDE HCL 25 MG PO CAPS
25.0000 mg | ORAL_CAPSULE | Freq: Four times a day (QID) | ORAL | Status: AC
  Administered 2013-07-25 – 2013-07-26 (×4): 25 mg via ORAL
  Filled 2013-07-25 (×4): qty 1

## 2013-07-25 NOTE — ED Notes (Signed)
Report given to Annette, RN 

## 2013-07-25 NOTE — ED Notes (Signed)
Patient has arrived on pod c.  

## 2013-07-25 NOTE — ED Provider Notes (Signed)
CSN: [id number]     Arrival date & time 07/25/13  0865 History   First MD Initiated Contact with Patient 07/25/13 [phone]     Chief Complaint  Patient presents with  . Drug Overdose  . Suicidal   (Consider location/radiation/quality/duration/timing/severity/associated sxs/prior Treatment) HPI  42 year old male brought in by EMS after an intentional drug overdose. Patient increasingly depressed. 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 Also continued relationship issues with ex-wife whom he has a child with. Endorses suicidal ideation. Patient is unsure of the exact timing, but sometimes from before midnight he took 30 tablets of 30 mg lisinopril, 30 tablets of 50 mg trazodone and 25 Vicodin 5/325. He drank 16 oz of vodka with this.. Denies any other additional ingestion. Parents directly questioned him in about possible overdose last night because was not acting like his normal self, but he denied it. Eventually called EMS earlier this morning after he fell in his bedroom and admitted to parents that he did take these. He is currently complain of feeling generally weak. Initially was hypotensive 80/40s but answering questions and following commands. Patient did receive approximately 500 cc normal saline prior to arrival as well as 1.5 mg of Narcan and 1 mg of glucagon.   Past Medical History  Diagnosis Date  . Hypertension   . Back pain   . Insomnia    Past Surgical History  Procedure Laterality Date  . Tonsillectomy    . Back surgery  1998,2008    L3 L4 L5 fusion   No family history on file. History  Substance Use Topics  . Smoking status: Current Every Day Smoker  . Smokeless tobacco: Not on file  . Alcohol Use: Yes    Review of Systems  All systems reviewed and negative, other than as noted in HPI.  Allergies  Review of patient's allergies indicates no known allergies.  Home Medications   Current Outpatient Rx  Name   Route  Sig  Dispense  Refill  . hydrochlorothiazide (HYDRODIURIL) 12.5 MG tablet   Oral   Take 12.5 mg by mouth daily.         Marland Kitchen HYDROcodone-acetaminophen (NORCO/VICODIN) 5-325 MG per tablet   Oral   Take 1 tablet by mouth every 6 (six) hours as needed for pain.         Marland Kitchen ibuprofen (ADVIL,MOTRIN) 200 MG tablet   Oral   Take 200 mg by mouth every 6 (six) hours as needed for pain.         Marland Kitchen lisinopril (PRINIVIL,ZESTRIL) 30 MG tablet   Oral   Take 30 mg by mouth daily.         . naproxen sodium (ANAPROX) 220 MG tablet   Oral   Take 220 mg by mouth 2 (two) times daily with a meal.         . omeprazole (PRILOSEC) 20 MG capsule   Oral   Take 20 mg by mouth daily.         . PredniSONE (STERAPRED 12 DAY PO)   Oral   Take 1-6 tablets by mouth See admin instructions. Prednisone 12 day taper pack         . traZODone (DESYREL) 50 MG tablet   Oral   Take 50 mg by mouth at bedtime.          BP 108/63  Pulse 93  Temp(Src) 97.9 F (36.6 C) (Oral)  Resp 16  Wt 190 lb (86.183 kg)  SpO2 93% Physical Exam  Nursing note and vitals reviewed. Constitutional: He is oriented to person, place, and time. He appears well-developed and well-nourished. No distress.  HENT:  Head: Normocephalic and atraumatic.  Eyes: Conjunctivae are normal. Right eye exhibits no discharge. Left eye exhibits no discharge.  Neck: Neck supple.  Cardiovascular: Regular rhythm and normal heart sounds.  Exam reveals no gallop and no friction rub.   No murmur heard. Mild tachycardia  Pulmonary/Chest: Effort normal and breath sounds normal. No respiratory distress.  Abdominal: Soft. He exhibits no distension. There is no tenderness.  Musculoskeletal: He exhibits no edema and no tenderness.  Neurological: He is oriented to person, place, and time. No cranial nerve deficit. He exhibits normal muscle tone. Coordination normal.  GCS 14. Patient is mildly drowsy but awakens to voice.  Skin: Skin is warm  and dry.  Psychiatric: His behavior is normal. Thought content normal.  Affect is flat. Speech is clear. Content is appropriate. Does not appear to be responding to internal stimuli.    ED Course  Procedures (including critical care time)  CRITICAL CARE Performed by: Raeford Razor  Total critical care time: 35 minutes  Critical care time was exclusive of separately billable procedures and treating other patients. Critical care was necessary to treat or prevent imminent or life-threatening deterioration. Critical care was time spent personally by me on the following activities: development of treatment plan with patient and/or surrogate as well as nursing, discussions with consultants, evaluation of patient's response to treatment, examination of patient, obtaining history from patient or surrogate, ordering and performing treatments and interventions, ordering and review of laboratory studies, ordering and review of radiographic studies, pulse oximetry and re-evaluation of patient's condition.  Labs Review Labs Reviewed  CBC WITH DIFFERENTIAL - Abnormal; Notable for the following:    WBC 15.9 (*)    RBC 4.18 (*)    Hemoglobin 12.7 (*)    HCT 37.7 (*)    Neutrophils Relative % 87 (*)    Neutro Abs 13.9 (*)    Lymphocytes Relative 9 (*)    All other components within normal limits  COMPREHENSIVE METABOLIC PANEL - Abnormal; Notable for the following:    Sodium 134 (*)    Potassium 3.3 (*)    Glucose, Bld 152 (*)    BUN 29 (*)    Albumin 3.4 (*)    AST 61 (*)    Total Bilirubin 0.1 (*)    GFR calc non Af Amer 71 (*)    GFR calc Af Amer 82 (*)    All other components within normal limits  SALICYLATE LEVEL - Abnormal; Notable for the following:    Salicylate Lvl <2.0 (*)    All other components within normal limits  ACETAMINOPHEN LEVEL  MAGNESIUM  ETHANOL  URINE RAPID DRUG SCREEN (HOSP PERFORMED)  URINALYSIS, ROUTINE W REFLEX MICROSCOPIC   Imaging Review No results  found.  EKG Interpretation     Ventricular Rate:  71 PR Interval:  129 QRS Duration: 86 QT Interval:  446 QTC Calculation: 485 R Axis:   36 Text Interpretation:  Sinus rhythm Borderline T wave abnormalities Borderline prolonged QT interval            MDM   1. Intentional drug overdose, initial encounter   2. Suicide attempt   3. Hypotension     42 year old male with intentional drug overdose with intent for self-harm. Hypotension which has responded to IV fluids. Ingestion sometime before midnight yesterday. We'll continue to observe  the emergency room at this time but, if remains hemodynamically stable and workup is unremarkable will clear for further psych  evaluation.     Raeford Razor, MD 07/25/13 1640

## 2013-07-25 NOTE — BH Assessment (Signed)
[facility] Assessment Progress Note Update:  Called pt's nurse, Drinda Butts, to inform her that pt's tele assessment appt scheduled for 1445.  This clinician already spoke with EDP Kohut regarding this consult.

## 2013-07-25 NOTE — ED Notes (Signed)
SPOKE WITH KRISTEN AT [facility]. SHE WILL CALL BACK TO SET UP A TIME FOR EVAL.

## 2013-07-25 NOTE — BH Assessment (Signed)
Tele Assessment Note   Bryan Huffman is an 42 y.o. male that was assessed via tele assessment after being brought in by EMS after an intentional drug overdose. Patient increasingly depressed by report due to having no job, living with parents, and having relationship issues. 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 Pt also has continued relationship issues with ex-wife whom he has a child with. Pt continues to endorse SI.  Pt denies prior attempts.  Pt stated, "to [facility] with it all."  Patient is unsure of the exact timing, but sometimes from before midnight he took 30 tablets of 30 mg Lisinopril, 30 tablets of 50 mg Trazodone and 25 Vicodin 5/325. He drank 16 oz of vodka with this as well by report.   Pt stated he has struggled with alcohol "for a long time, " and has been in several facilites in the past, both inpatient and outpatient, for SA.  Pt stated he has been drinking 3-4 Strawberitas 3-4 x/week, but has not drank in 2 days until the 2 pints of liquor last night.  Pt also admits to marijuana use occasionally.  Parents directly questioned him in about possible overdose last night because was not acting like his normal self, but he denied it by report. Parents eventually called EMS earlier this morning after he fell in his bedroom and admitted to parents that he did take these.  Pt denies HI or psychosis.  Pt was calm, cooperative, with depressed affect.  Consulted with Fransisca Kaufmann, NP, who agreed pt appropriate for inpatient treatment and was accepted to [facility] to bed 303-1.  Updated pt's nurse, Harriett Sine, as well as EDP Kohut.  Updated ED staff and TTS staff.   Axis I: 296.33 Major Depressive Disorder, Recurrent, Severe Without Psychotic Features, 305.00 Alcohol Abuse Axis II: Deferred Axis III:  Past Medical History  Diagnosis Date  . Hypertension   . Back pain   . Insomnia    Axis IV: economic problems, housing problems, occupational  problems, other psychosocial or environmental problems, problems related to social environment, problems with access to health care services and problems with primary support group Axis V: 21-30 behavior considerably influenced by delusions or hallucinations OR serious impairment in judgment, communication OR inability to function in almost all areas  Past Medical History:  Past Medical History  Diagnosis Date  . Hypertension   . Back pain   . Insomnia     Past Surgical History  Procedure Laterality Date  . Tonsillectomy    . Back surgery  7846,[id number]    L3 L4 L5 fusion    Family History: No family history on file.  Social History:  reports that he has been smoking.  He does not have any smokeless tobacco history on file. He reports that he drinks alcohol. He reports that he uses illicit drugs (Marijuana).  Additional Social History:  Alcohol / Drug Use Pain Medications: see MAR Prescriptions: see MAR Over the Counter: see MAR History of alcohol / drug use?: Yes Longest period of sobriety (when/how long): unknown Negative Consequences of Use: Personal relationships Withdrawal Symptoms:  (pt currently denies) Substance #1 Name of Substance 1: ETOH 1 - Age of First Use: teens 1 - Amount (size/oz): 2-3 strawberitas 1 - Frequency: 3-4 x/week 1 - Duration: ongoing 1 - Last Use / Amount: last night - 2 pints Vodka Substance #2 Name of Substance 2: Marijuana 2 - Age of First Use: 9 2 - Amount (size/oz):  varies 2 - Frequency: "not that much now" 2 - Duration: ongoing 2 - Last Use / Amount: Monday - 2 puffs of a joint  CIWA: CIWA-Ar BP: 120/67 mmHg Pulse Rate: 85 Nausea and Vomiting: no nausea and no vomiting Tactile Disturbances: none Tremor: no tremor Auditory Disturbances: not present Paroxysmal Sweats: no sweat visible Visual Disturbances: not present Anxiety: no anxiety, at ease Headache, Fullness in Head: none present Agitation: normal activity Orientation and  Clouding of Sensorium: oriented and can do serial additions CIWA-Ar Total: 0 COWS:    Allergies: No Known Allergies  Home Medications:  (Not in a hospital admission)  OB/GYN Status:  No LMP for male patient.  General Assessment Data Location of Assessment: [facility] ED Is this a Tele or Face-to-Face Assessment?: Tele Assessment Is this an Initial Assessment or a Re-assessment for this encounter?: Initial Assessment Living Arrangements: Parent Can pt return to current living arrangement?: Yes Admission Status: Voluntary Is patient capable of signing voluntary admission?: Yes Transfer from: Acute Hospital Referral Source: Self/Family/Friend  Medical Screening Exam [facility] Walk-in ONLY) Medical Exam completed: No Reason for MSE not completed: Other: (pt med cleared at [facility])  [facility] Crisis Care Plan Living Arrangements: Parent Name of Psychiatrist: none Name of Therapist: none  Education Status Is patient currently in school?: No  Risk to self Suicidal Ideation: Yes-Currently Present Suicidal Intent: Yes-Currently Present Is patient at risk for suicide?: Yes Suicidal Plan?: Yes-Currently Present Specify Current Suicidal Plan: pt overdosed on his prescribed drugs and alcohol Access to Means: Yes Specify Access to Suicidal Means: Had access to meds and alcohol What has been your use of drugs/alcohol within the last 12 months?: pt admits to alcohol use and marijuana use Previous Attempts/Gestures: No How many times?: 0 Other Self Harm Risks: pt denies Triggers for Past Attempts: None known Intentional Self Injurious Behavior: Damaging Comment - Self Injurious Behavior: ongoing SA Family Suicide History: No Recent stressful life event(s): Conflict (Comment);Job Loss;Financial Problems;Recent negative physical changes;Turmoil (Comment) (no job, lives with parents, conflict with girlfirned, financ) Persecutory voices/beliefs?: No Depression: Yes Depression Symptoms:  Despondent;Insomnia;Tearfulness;Fatigue;Loss of interest in usual pleasures;Feeling worthless/self pity Substance abuse history and/or treatment for substance abuse?: Yes Suicide prevention information given to non-admitted patients: Not applicable  Risk to Others Homicidal Ideation: No Thoughts of Harm to Others: No Current Homicidal Intent: No Current Homicidal Plan: No Access to Homicidal Means: No Identified Victim: pt denies History of harm to others?: No Assessment of Violence: None Noted Violent Behavior Description: na - pt calm, cooperative Does patient have access to weapons?: No Criminal Charges Pending?: No Does patient have a court date: No  Psychosis Hallucinations: None noted Delusions: None noted  Mental Status Report Appear/Hygiene: Disheveled Eye Contact: Fair Motor Activity: Freedom of movement;Unremarkable     ADLScreening [facility] Assessment [facility]) Patient's cognitive ability adequate to safely complete daily activities?: Yes Patient able to express need for assistance with ADLs?: Yes Independently performs ADLs?: Yes (appropriate for developmental age)  Prior Inpatient Therapy Prior Inpatient Therapy: Yes Prior Therapy Dates: Pt cannot recall dates Prior Therapy Facilty/Provider(s): [facility], [facility], [facility] Reason for Treatment: Detox/Rehab  Prior Outpatient Therapy Prior Outpatient Therapy: Yes Prior Therapy Dates: Pt cannot recall dates Prior Therapy Facilty/Provider(s): Unknown providers Reason for Treatment: SA  ADL Screening (condition at time of admission) Patient's cognitive ability adequate to safely complete daily activities?: Yes Is the patient deaf or have difficulty hearing?: No Does the patient have difficulty seeing, even when wearing glasses/contacts?: No Does the patient have difficulty  concentrating, remembering, or making decisions?: No Patient able to express need for assistance with ADLs?: Yes Does the patient have difficulty  dressing or bathing?: No Independently performs ADLs?: Yes (appropriate for developmental age) Does the patient have difficulty walking or climbing stairs?: Yes  Home Assistive Devices/Equipment Home Assistive Devices/Equipment: None    Abuse/Neglect Assessment (Assessment to be complete while patient is alone) Physical Abuse: Denies Verbal Abuse: Denies Sexual Abuse: Denies Exploitation of patient/patient's resources: Denies Self-Neglect: Denies Values / Beliefs Cultural Requests During Hospitalization: None Spiritual Requests During Hospitalization: None Consults Spiritual Care Consult Needed: No Social Work Consult Needed: No Merchant navy officer (For Healthcare) Advance Directive: Patient does not have advance directive;Patient would not like information    Additional Information 1:1 In Past 12 Months?: No CIRT Risk: No Elopement Risk: No Does patient have medical clearance?: Yes     Disposition:  Disposition Initial Assessment Completed for this Encounter: Yes Disposition of Patient: Inpatient treatment program Type of inpatient treatment program: Adult (Pt accepted [facility])  Caryl Comes 07/25/2013 4:59 PM

## 2013-07-25 NOTE — Progress Notes (Signed)
41 year old male pt admitted on voluntary basis. Pt reports taking some of his medications and washing them down with some vodka in a suicide attempt the other night. Pt reports that he fell asleep a couple hours, woke up, stumbled and fell down and told his parents what he had done, who in turn called 911. Emergency personnel responded and pt was admitted to [facility] before coming to [facility]. Pt, on admission, reports that he has been feeling increasingly depressed recently, has been fighting with his ex-wife who he says is also his girlfriend. Pt also reports chronic pain issues, inability to find a job and financial stressors. Pt states that he is here to help him with his depression. Pt is ambulating with assistance of wheelchair upon arrival. Pt is able to contract for safety on the unit, pt was oriented and safety maintained.

## 2013-07-25 NOTE — Tx Team (Signed)
Initial Interdisciplinary Treatment Plan  PATIENT STRENGTHS: (choose at least two) Ability for insight Average or above average intelligence Capable of independent living  PATIENT STRESSORS: Marital or family conflict Substance abuse   PROBLEM LIST: Problem List/Patient Goals Date to be addressed Date deferred Reason deferred Estimated date of resolution  Depression 07/25/13     Substance Abuse 07/25/13                                                DISCHARGE CRITERIA:  Ability to meet basic life and health needs Improved stabilization in mood, thinking, and/or behavior Verbal commitment to aftercare and medication compliance Withdrawal symptoms are absent or subacute and managed without 24-hour nursing intervention  PRELIMINARY DISCHARGE PLAN: Attend aftercare/continuing care group Return to previous living arrangement  PATIENT/FAMIILY INVOLVEMENT: This treatment plan has been presented to and reviewed with the patient, Bryan Huffman, and/or family member, .  The patient and family have been given the opportunity to ask questions and make suggestions.  [name], [place] 07/25/2013, 6:06 PM

## 2013-07-25 NOTE — ED Notes (Signed)
Pt currently involved in telepsych consultation.

## 2013-07-25 NOTE — ED Notes (Signed)
Received pt from home with c/o medication overdose, ETOH and suicidal. Pt lives with his parents. Pt reports went to get out of bed and fell. Parents heard pt fall and called EMS. Pt took 25 vicodin, 30 Lisinopril, 30 trazadone and drank 2 pints of vodka. Pt does not recall when he did all this. Pt AAOx3. Pt given 1.5mg  of Narcan, 2 mg of glucagon and 500 ML bolus of NSS by EMS.

## 2013-07-26 ENCOUNTER — Encounter (HOSPITAL_COMMUNITY): Payer: Self-pay | Admitting: Psychiatry

## 2013-07-26 DIAGNOSIS — F329 Major depressive disorder, single episode, unspecified: Secondary | ICD-10-CM | POA: Diagnosis present

## 2013-07-26 DIAGNOSIS — F1020 Alcohol dependence, uncomplicated: Secondary | ICD-10-CM | POA: Diagnosis present

## 2013-07-26 DIAGNOSIS — M549 Dorsalgia, unspecified: Secondary | ICD-10-CM | POA: Diagnosis present

## 2013-07-26 DIAGNOSIS — F39 Unspecified mood [affective] disorder: Secondary | ICD-10-CM

## 2013-07-26 DIAGNOSIS — F332 Major depressive disorder, recurrent severe without psychotic features: Secondary | ICD-10-CM | POA: Diagnosis present

## 2013-07-26 MED ORDER — DULOXETINE HCL 30 MG PO CPEP
30.0000 mg | ORAL_CAPSULE | Freq: Two times a day (BID) | ORAL | Status: DC
  Administered 2013-07-26 – 2013-07-30 (×8): 30 mg via ORAL
  Filled 2013-07-26 (×9): qty 1
  Filled 2013-07-26 (×2): qty 28
  Filled 2013-07-26 (×3): qty 1

## 2013-07-26 MED ORDER — GABAPENTIN 300 MG PO CAPS
300.0000 mg | ORAL_CAPSULE | Freq: Three times a day (TID) | ORAL | Status: DC
  Administered 2013-07-26 – 2013-07-30 (×13): 300 mg via ORAL
  Filled 2013-07-26: qty 1
  Filled 2013-07-26: qty 42
  Filled 2013-07-26 (×8): qty 1
  Filled 2013-07-26: qty 42
  Filled 2013-07-26 (×4): qty 1
  Filled 2013-07-26: qty 42
  Filled 2013-07-26 (×3): qty 1

## 2013-07-26 MED ORDER — KETOROLAC TROMETHAMINE 30 MG/ML IJ SOLN
30.0000 mg | Freq: Once | INTRAMUSCULAR | Status: AC
  Administered 2013-07-26: 30 mg via INTRAVENOUS
  Filled 2013-07-26 (×2): qty 1

## 2013-07-26 MED ORDER — NAPROXEN 500 MG PO TABS
500.0000 mg | ORAL_TABLET | Freq: Once | ORAL | Status: AC
  Administered 2013-07-26: 500 mg via ORAL
  Filled 2013-07-26 (×2): qty 1

## 2013-07-26 MED ORDER — LIDOCAINE 5 % EX PTCH
1.0000 | MEDICATED_PATCH | Freq: Every day | CUTANEOUS | Status: DC
Start: 2013-07-26 — End: 2013-07-30
  Administered 2013-07-26 – 2013-07-30 (×4): 1 via TRANSDERMAL
  Filled 2013-07-26 (×7): qty 1

## 2013-07-26 NOTE — Progress Notes (Signed)
Adult Psychoeducational Group Note  Date:  07/26/2013 Time:  3:22 PM  Group Topic/Focus:  Healthy Communication:   The focus of this group is to discuss communication, barriers to communication, as well as healthy ways to communicate with others.  Participation Level:  Did Not Attend   Additional Comments:  Pt experiencing severe back pain and instructed to stay in bed by nurse.  Tora Perches N 07/26/2013, 3:22 PM

## 2013-07-26 NOTE — [facility] Group Notes (Signed)
[facility] Group Notes: (Clinical Social Work)   07/26/2013      Type of Therapy:  Group Therapy   Participation Level:  Did Not Attend    Ambrose Mantle, LCSW 07/26/2013, 12:46 PM

## 2013-07-26 NOTE — [facility] Group Notes (Signed)
[facility] Group Notes:  (Nursing/MHT/Case Management/Adjunct)  Date:  07/26/2013  Time:  1:30 PM  Type of Therapy:  Psychoeducational Skills  Participation Level:  Did Not Attend   Buford Dresser 07/26/2013, 1:30 PM
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
## 2013-07-26 NOTE — [facility] Suicide Risk Assessment (Signed)
Suicide Risk Assessment  Admission Assessment     Nursing information obtained from:    Demographic factors:    Current Mental Status:    Loss Factors:    Historical Factors:    Risk Reduction Factors:     CLINICAL FACTORS:   Depression:   Comorbid alcohol abuse/dependence Alcohol/Substance Abuse/Dependencies Chronic Pain Medical Diagnoses and Treatments/Surgeries  COGNITIVE FEATURES THAT CONTRIBUTE TO RISK:  Closed-mindedness Polarized thinking Thought constriction (tunnel vision)    SUICIDE RISK:   Moderate:  Frequent suicidal ideation with limited intensity, and duration, some specificity in terms of plans, no associated intent, good self-control, limited dysphoria/symptomatology, some risk factors present, and identifiable protective factors, including available and accessible social support.  PLAN OF CARE: Supportive approach/coping skills/relapse prevention                               Detox as needed, address the pain the depression  I certify that inpatient services furnished can reasonably be expected to improve the patient's condition.  Lovie Zarling A 07/26/2013, 4:04 PM

## 2013-07-26 NOTE — Progress Notes (Signed)
Patient ID: Bryan Huffman, male   DOB: 03/27/1971, 42 y.o.   MRN: [id number]  D: Pt has been very flat and depressed today on the unit, he has been in the bed all day and has refused to get out of the bed. Pt reported that he was in severe pain and could not walk. Pt was seen by Dr. Dub Mikes, and medications were ordered for pain and instructions were given to give patient his medications in his room. Pt reported that his depression was 10, and his hopelessness was a 10 as well. Pt reported that he was having SI on and off, and that he could contract for safety but as long as he was in pain he would be suicidal. A: 15 min checks continued for pt safety. R: Pt safety maintained.

## 2013-07-26 NOTE — Progress Notes (Signed)
D.  Pt. Has a flat affect, depressed mood.  Pt. Irritable and has been in bed  this evening and did not attend any groups due to pain he  reports in his left hip and radiating to his left knee.  Uses a wheel chair to assist with mobility Pt. Has been eating and drinking fluids. A.  Pain medications given as ordered.  Dr. Dub Mikes aware.  Emotional support given. R.  Pt. Receptive and remains safe.

## 2013-07-27 DIAGNOSIS — F411 Generalized anxiety disorder: Secondary | ICD-10-CM

## 2013-07-27 MED ORDER — PREDNISONE (PAK) 5 MG PO TABS: 5.0000 mg | ORAL_TABLET | ORAL | Status: DC

## 2013-07-27 MED ORDER — PREDNISONE 5 MG PO TABS: 5.0000 mg | ORAL_TABLET | Freq: Three times a day (TID) | ORAL | Status: DC

## 2013-07-27 MED ORDER — PREDNISONE 20 MG PO TABS
20.0000 mg | ORAL_TABLET | Freq: Every day | ORAL | Status: AC
  Administered 2013-07-30: 20 mg via ORAL
  Filled 2013-07-27 (×2): qty 1

## 2013-07-27 MED ORDER — PREDNISONE 5 MG PO TABS
5.0000 mg | ORAL_TABLET | Freq: Once | ORAL | Status: DC
  Filled 2013-07-27: qty 1

## 2013-07-27 MED ORDER — PREDNISONE 10 MG PO TABS
10.0000 mg | ORAL_TABLET | Freq: Once | ORAL | Status: AC
  Administered 2013-07-27: 10 mg via ORAL
  Filled 2013-07-27: qty 1

## 2013-07-27 MED ORDER — PREDNISONE (PAK) 5 MG PO TABS
10.0000 mg | ORAL_TABLET | Freq: Every morning | ORAL | Status: DC
  Administered 2013-07-27: 10 mg via ORAL
  Filled 2013-07-27: qty 21

## 2013-07-27 MED ORDER — PREDNISONE 10 MG PO TABS
10.0000 mg | ORAL_TABLET | Freq: Every day | ORAL | Status: DC
  Filled 2013-07-27: qty 1

## 2013-07-27 MED ORDER — PREDNISONE 20 MG PO TABS
40.0000 mg | ORAL_TABLET | Freq: Every day | ORAL | Status: AC
  Administered 2013-07-28: 40 mg via ORAL
  Filled 2013-07-27 (×2): qty 2

## 2013-07-27 MED ORDER — PREDNISONE 5 MG PO TABS
5.0000 mg | ORAL_TABLET | Freq: Once | ORAL | Status: AC
  Administered 2013-07-27: 5 mg via ORAL
  Filled 2013-07-27: qty 1

## 2013-07-27 MED ORDER — NAPROXEN 500 MG PO TABS
500.0000 mg | ORAL_TABLET | Freq: Two times a day (BID) | ORAL | Status: DC
  Administered 2013-07-27: 250 mg via ORAL
  Administered 2013-07-28: 500 mg via ORAL
  Filled 2013-07-27 (×6): qty 1

## 2013-07-27 MED ORDER — PREDNISONE 5 MG PO TABS
25.0000 mg | ORAL_TABLET | Freq: Once | ORAL | Status: AC
  Administered 2013-07-27: 22:00:00 25 mg via ORAL
  Filled 2013-07-27: qty 1

## 2013-07-27 MED ORDER — PREDNISONE 5 MG PO TABS: 5.0000 mg | ORAL_TABLET | Freq: Four times a day (QID) | ORAL | Status: DC

## 2013-07-27 MED ORDER — PREDNISONE 5 MG PO TABS: 10.0000 mg | ORAL_TABLET | Freq: Every evening | ORAL | Status: DC

## 2013-07-27 MED ORDER — PREDNISONE 20 MG PO TABS
30.0000 mg | ORAL_TABLET | Freq: Every day | ORAL | Status: AC
  Administered 2013-07-29: 08:00:00 30 mg via ORAL
  Filled 2013-07-27: qty 1

## 2013-07-27 NOTE — Progress Notes (Signed)
Patient did not attend the evening speaker AA meeting. Pt remained in bed during group.   

## 2013-07-27 NOTE — Progress Notes (Addendum)
The focus of this group is to educate the patient on the purpose and policies of crisis stabilization and provide a format to answer questions about their admission.  The group details unit policies and expectations of patients while admitted.  Patient did not attend 0900 nurse orientation education group because he was in bed. 

## 2013-07-27 NOTE — Progress Notes (Signed)
D- Patient continues to c/o leg and back pain.  States he is unable to sit for a long period of time.  No group attendance and is not going to cafeteria for meals.  A- Emotional support offered.  Medication education done.  Continue 15' checks for safety.  Continue POC and evaluation of treatment goals.  R- Safety maintained.

## 2013-07-27 NOTE — Progress Notes (Signed)
Patient did attend the evening speaker AA meeting.  

## 2013-07-27 NOTE — Progress Notes (Addendum)
Spoke with pt's parents around 1945 concerning pt's pain and issues with inability to ambulate.  Pt states that he has a history of sciatic damage and he has had 2 rods and 6 screws placed his back.  Pt was requesting to go to ED to have MRI performed.  Spoke with Shelda Jakes, PA, concerning pt's issues.  She states that she spoke with pt earlier and performed a neurological assessment that was WNL.  She states that she advised pt that we would treat him with steroids and place ice packs on his back for relief.  Pt started on 5 day prednisone taper.  I called parents to inform them of our treatment plan.

## 2013-07-27 NOTE — Progress Notes (Signed)
[facility] MD Progress Note  07/27/2013 9:08 PM Bryan Huffman  MRN:  [id number] Subjective:  Bryan Huffman continues to be somatically focused. He was examined by Shelda Jakes PA and she recommended a medication regime to address the pain. He was started on a prednisone taper. Meanwhile he continues to be detox. If these approach can help with the pain he will be able to concentrate on what he is in the unit for. Diagnosis:   DSM5: Schizophrenia Disorders:   Obsessive-Compulsive Disorders:   Trauma-Stressor Disorders:   Substance/Addictive Disorders:  Alcohol Related Disorder - Severe (303.90) Depressive Disorders:  Major Depressive Disorder - Severe (296.23)  Axis I: Anxiety Disorder NOS and Mood Disorder NOS  ADL's:  Impaired  Sleep: Fair  Appetite:  Fair  Suicidal Ideation:  Plan:  denies Intent:  denies Means:  denies Homicidal Ideation:  Plan:  denies Intent:  denies Means:  denies AEB (as evidenced by):  Psychiatric Specialty Exam: Review of Systems  Constitutional: Negative.   HENT: Negative.   Eyes: Negative.   Respiratory: Negative.   Cardiovascular: Negative.   Gastrointestinal: Negative.   Genitourinary: Negative.   Musculoskeletal: Positive for back pain.       Leg pain  Skin: Negative.   Neurological: Negative.   Endo/Heme/Allergies: Negative.   Psychiatric/Behavioral: Positive for depression and substance abuse. The patient is nervous/anxious.     Blood pressure 133/85, pulse 80, temperature 98.1 F (36.7 C), temperature source Oral, resp. rate 19, height 5' 6.5" (1.689 m), weight 90.719 kg (200 lb).Body mass index is 31.8 kg/(m^2).  General Appearance: Disheveled  Eye Solicitor::  Fair  Speech:  Clear and Coherent and Slow  Volume:  Decreased  Mood:  Anxious, Depressed and in pain  Affect:  anxious, worried  Thought Process:  Coherent and Goal Directed  Orientation:  Full (Time, Place, and Person)  Thought Content:  symtpoms, somatically focused  Suicidal  Thoughts:  No  Homicidal Thoughts:  No  Memory:  Immediate;   Fair Recent;   Fair Remote;   Fair  Judgement:  Fair  Insight:  Present and Shallow  Psychomotor Activity:  Restlessness  Concentration:  Fair  Recall:  Fair  Akathisia:  No  Handed:    AIMS (if indicated):     Assets:  Desire for Improvement  Sleep:  Number of Hours: 6.5   Current Medications: Current Facility-Administered Medications  Medication Dose Route Frequency Provider Last Rate Last Dose  . acetaminophen (TYLENOL) tablet 650 mg  650 mg Oral Q6H PRN Fransisca Kaufmann, NP   650 mg at 07/26/13 1733  . alum & mag hydroxide-simeth (MAALOX/MYLANTA) 200-200-20 MG/5ML suspension 30 mL  30 mL Oral Q4H PRN Fransisca Kaufmann, NP      . chlordiazePOXIDE (LIBRIUM) capsule 25 mg  25 mg Oral Q6H PRN Fransisca Kaufmann, NP      . Melene Muller ON 07/28/2013] chlordiazePOXIDE (LIBRIUM) capsule 25 mg  25 mg Oral BH-qamhs Fransisca Kaufmann, NP       Followed by  . [START ON 07/29/2013] chlordiazePOXIDE (LIBRIUM) capsule 25 mg  25 mg Oral Daily Fransisca Kaufmann, NP      . cloNIDine (CATAPRES) tablet 0.1 mg  0.1 mg Oral QID Fransisca Kaufmann, NP   0.1 mg at 07/27/13 1206   Followed by  . [START ON 07/28/2013] cloNIDine (CATAPRES) tablet 0.1 mg  0.1 mg Oral BH-qamhs Fransisca Kaufmann, NP       Followed by  . [START ON 07/30/2013] cloNIDine (CATAPRES) tablet 0.1 mg  0.1 mg Oral  QAC breakfast Fransisca Kaufmann, NP      . dicyclomine (BENTYL) tablet 20 mg  20 mg Oral Q6H PRN Fransisca Kaufmann, NP      . DULoxetine (CYMBALTA) DR capsule 30 mg  30 mg Oral BID Rachael Fee, MD   30 mg at 07/27/13 0855  . gabapentin (NEURONTIN) capsule 300 mg  300 mg Oral TID Rachael Fee, MD   300 mg at 07/27/13 1627  . hydrochlorothiazide (MICROZIDE) capsule 12.5 mg  12.5 mg Oral Daily Rachael Fee, MD   12.5 mg at 07/27/13 0856  . hydrOXYzine (ATARAX/VISTARIL) tablet 25 mg  25 mg Oral Q6H PRN Fransisca Kaufmann, NP      . lidocaine (LIDODERM) 5 % 1 patch  1 patch Transdermal Daily Rachael Fee, MD   1 patch at  07/26/13 1207  . loperamide (IMODIUM) capsule 2-4 mg  2-4 mg Oral PRN Fransisca Kaufmann, NP      . loperamide (IMODIUM) capsule 2-4 mg  2-4 mg Oral PRN Fransisca Kaufmann, NP      . magnesium hydroxide (MILK OF MAGNESIA) suspension 30 mL  30 mL Oral Daily PRN Fransisca Kaufmann, NP      . methocarbamol (ROBAXIN) tablet 500 mg  500 mg Oral Q8H PRN Fransisca Kaufmann, NP   500 mg at 07/26/13 2222  . multivitamin with minerals tablet 1 tablet  1 tablet Oral Daily Fransisca Kaufmann, NP   1 tablet at 07/27/13 0856  . naproxen (NAPROSYN) tablet 500 mg  500 mg Oral BID WC Verne Spurr, PA-C   250 mg at 07/27/13 1627  . ondansetron (ZOFRAN-ODT) disintegrating tablet 4 mg  4 mg Oral Q6H PRN Fransisca Kaufmann, NP      . ondansetron (ZOFRAN-ODT) disintegrating tablet 4 mg  4 mg Oral Q6H PRN Fransisca Kaufmann, NP      . pantoprazole (PROTONIX) EC tablet 40 mg  40 mg Oral Daily Fransisca Kaufmann, NP   40 mg at 07/27/13 0857  . predniSONE (DELTASONE) tablet 25 mg  25 mg Oral Once [employer], PA-C       Followed by  . [START ON 07/28/2013] predniSONE (DELTASONE) tablet 40 mg  40 mg Oral Q breakfast Verne Spurr, PA-C       Followed by  . [START ON 07/29/2013] predniSONE (DELTASONE) tablet 30 mg  30 mg Oral Q breakfast Verne Spurr, PA-C       Followed by  . [START ON 07/30/2013] predniSONE (DELTASONE) tablet 20 mg  20 mg Oral Q breakfast Verne Spurr, PA-C       Followed by  . [START ON 07/31/2013] predniSONE (DELTASONE) tablet 10 mg  10 mg Oral Q breakfast Verne Spurr, PA-C      . thiamine (VITAMIN B-1) tablet 100 mg  100 mg Oral Daily Fransisca Kaufmann, NP   100 mg at 07/27/13 0857  . traZODone (DESYREL) tablet 50 mg  50 mg Oral QHS PRN,MR X 1 Fransisca Kaufmann, NP   50 mg at 07/26/13 2221    Lab Results: No results found for this or any previous visit (from the past 48 hour(s)).  Physical Findings: AIMS: Facial and Oral Movements Muscles of Facial Expression: None, normal Lips and Perioral Area: None, normal Jaw: None, normal Tongue: None,  normal,Extremity Movements Upper (arms, wrists, hands, fingers): None, normal Lower (legs, knees, ankles, toes): None, normal, Trunk Movements Neck, shoulders, hips: None, normal, Overall Severity Severity of abnormal movements (highest score from questions above): None, normal Incapacitation due to abnormal movements:  None, normal Patient's awareness of abnormal movements (rate only patient's report): No Awareness, Dental Status Current problems with teeth and/or dentures?: No Does patient usually wear dentures?: No  CIWA:  CIWA-Ar Total: 5 COWS:     Treatment Plan Summary: Daily contact with patient to assess and evaluate symptoms and progress in treatment Medication management  Plan: Supportive approach/coping skills/relape prevention           Continue detox           Reassess co morbidities          Follow [facility]'s recommendations  Medical Decision Making Problem Points:  Review of psycho-social stressors (1) Data Points:  Review of medication regiment & side effects (2) Review of new medications or change in dosage (2)  I certify that inpatient services furnished can reasonably be expected to improve the patient's condition.   Yashas Camilli A 07/27/2013, 9:08 PM

## 2013-07-27 NOTE — Progress Notes (Signed)
Patient observed in bed.  Attributes frustration to pain and home issues related to  job search and problems with his ex-wife whom he dates on and off. Denies S/I, H/I. Patient denies AH/VH. Parents were at bedside. Patient and parents expressed concern that he was receiving so many medications and because they felt he should have an MRI.  Patient attended evening group session. Patient stated "I feel better now that I have gotten out of that room and interacted with some people". Patient rates pain 6, anxiety/agitation 8. Patient stated that he spoke to his ex-wife and that she is "mad at me" and that "I tried to take the easy way out". The patient stated that he is not currently an alcoholic but has a history of alcoholism. States that he had started drinking more to help with his pain and he started having thoughts that lead him to mix meds and chase them with vodka because he was so frustrated with his job search, pain, and ex-wife not wanting him until he was working.  Support and encouragement offered. Patient was given ice pack and updated on the pain management plan.  Medications administered as ordered along with Trazodone, Vistaril, and Robaxin.  Q15 minute checks observed for safety.   Patient contracts for safety. Patient safety maintained on the unit.  Carrolyn Leigh, RN BSN

## 2013-07-27 NOTE — [facility] Group Notes (Signed)
[facility] Group Notes: (Clinical Social Work)   07/27/2013      Type of Therapy:  Group Therapy   Participation Level:  Did Not Attend    Ambrose Mantle, LCSW 07/27/2013, 12:30 PM

## 2013-07-28 DIAGNOSIS — F332 Major depressive disorder, recurrent severe without psychotic features: Secondary | ICD-10-CM

## 2013-07-28 DIAGNOSIS — F1910 Other psychoactive substance abuse, uncomplicated: Secondary | ICD-10-CM

## 2013-07-28 DIAGNOSIS — F1010 Alcohol abuse, uncomplicated: Secondary | ICD-10-CM

## 2013-07-28 MED ORDER — NABUMETONE 500 MG PO TABS
750.0000 mg | ORAL_TABLET | Freq: Two times a day (BID) | ORAL | Status: DC
  Administered 2013-07-28 – 2013-07-30 (×4): 750 mg via ORAL
  Filled 2013-07-28 (×6): qty 2
  Filled 2013-07-28 (×2): qty 1
  Filled 2013-07-28 (×2): qty 2

## 2013-07-28 NOTE — Progress Notes (Signed)
Pt reports he was having a fairly good day until this evening when he had a couple of negative phone conversations with his ex.  He has been irritable for most of the evening.  Obtained an order for the cane that pt's family brought in this evening.  Pt denies SI/HI/AV.  Pt says he is not having any withdrawal symptoms, but is having back/hip pain.  He says if he could just get the pain under control, it would decrease his depression significantly.  He has been in the dayroom watching TV and did attend evening AA group tonight.  Pt makes his needs known to staff.  Support and encouragement offered.  Safety maintained with q15 minute checks.

## 2013-07-28 NOTE — [facility] Suicide Risk Assessment (Addendum)
[facility] INPATIENT:  Family/Significant Other Suicide Prevention Education  Suicide Prevention Education:  Contact Attempts: Hydrologist (pt's [phone]) has been identified by the patient as the family member/significant other with whom the patient will be residing, and identified as the person(s) who will aid the patient in the event of a mental health crisis.  With written consent from the patient, two attempts were made to provide suicide prevention education, prior to and/or following the patient's discharge.  We were unsuccessful in providing suicide prevention education.  A suicide education pamphlet was given to the patient to share with family/significant other.  Date and time of first attempt: 11:30AM  Date and time of second attempt: 2:40PM (voicemail left)     07/29/13 10:30AM--Pt's father called CSW. SPE completed with [facility].  07/29/2013 10:36 AM   Smart, Herbert Seta 07/28/2013, 2:44 PM

## 2013-07-28 NOTE — Progress Notes (Signed)
Patient ID: Bryan Huffman, male   DOB: [birth year], 42 y.o.   MRN: [id number]  D: Patient pleasant on approach today. Focused on pain issues mostly this am. Reports that he really didn't need to detox. Depression "2" and hopelessness "3". Denies any SI. Asking about a toradol injection that he reported that Dr. Dub Mikes was going to order. A: Staff will monitor on q 15 minute checks, follow treatment plan, and give meds as ordered. R: Cooperative on the unit.

## 2013-07-28 NOTE — Tx Team (Signed)
Interdisciplinary Treatment Plan Update (Adult)  Date: 07/28/2013   Time Reviewed: 10:34 AM  Progress in Treatment:  Attending groups: Yes  Participating in groups:  Yes   Taking medication as prescribed: Yes  Tolerating medication: Yes  Family/Significant othe contact made: No  Patient understands diagnosis: Yes, AEB seeking treatment for SI, depression, ETOH detox, and severe pain.  Discussing patient identified problems/goals with staff: Yes  Medical problems stabilized or resolved: Yes  Denies suicidal/homicidal ideation: Yes during group, self report.  Patient has not harmed self or Others: Yes  New problem(s) identified: pt currently in wheelchair due to extreme back pain and weakness in legs.  Discharge Plan or Barriers: Pt refusing referral for therapy. Pt likely to follow up at [facility] for med management and is requesting appt with back/spine specialist. NP notified. Celso Amy NP).  Additional comments: 42 Y/o male who admited he tried to kill himself. 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 States things were going better for him and he had started dating his ex wife. He had also had back surgery twice. He has been dealing with back pain. His ex wife he says got really upset as he has not bee able to find a job and was insulting to him. He felt very overwhelmed, depressed hopeless and drank and took an OD of Lisinopril, Trazodone, Vicodin. He had been drinking after a prolonged period of sobriety. Pt currently in wheelchair due to severe back pain and weak legs/imbalance.  Reason for Continuation of Hospitalization:  Librium taper/Clonidine taper-withdrawals Mood stabilization Medication management Estimated length of stay: 2-3 days  For review of initial/current patient goals, please see plan of care.  Attendees:  Patient:    Family:    Physician: Celso Amy, PA  07/28/2013 10:33 AM   Nursing: Meryl Dare RN  07/28/2013 10:33 AM   Clinical Social Worker Shigeko Manard Smart, LCSWA  07/28/2013 10:33 AM   Other: Darden Dates Nurse CM  07/28/2013 10:34 AM   Other:    Other:    Other:    Scribe for Treatment Team:  Trula Slade LCSWA 07/28/2013 10:34 AM

## 2013-07-28 NOTE — Progress Notes (Signed)
[facility] MD Progress Note  07/28/2013 3:08 PM Bryan Huffman  MRN:  [id number] Subjective:  Patient complained of left hip pain and leg pain due to disc issues, medications adjusted.  Sleep was "good" and appetite "great" , upset about crushing his medications and overdosing, depression has improved but passive suicidal ideations, denies homicidal ideations. Diagnosis:   DSM5:  Substance/Addictive Disorders:  Alcohol Related Disorder - Severe (303.90) Depressive Disorders:  Major Depressive Disorder - Severe (296.23)  Axis I: Alcohol Abuse, Anxiety Disorder NOS, Major Depression, Recurrent severe, Substance Abuse and Substance Induced Mood Disorder Axis II: Deferred Axis III:  Past Medical History  Diagnosis Date  . Hypertension   . Back pain   . Insomnia   . Mental disorder   . Depression    Axis IV: other psychosocial or environmental problems, problems related to social environment and problems with primary support group Axis V: 41-50 serious symptoms  ADL's:  Intact  Sleep: Good  Appetite:  Good  Suicidal Ideation:  Plan:  yes Intent:  none Means:  none Homicidal Ideation:  Denies  Psychiatric Specialty Exam: Review of Systems  Constitutional: Negative.   HENT: Negative.   Eyes: Negative.   Respiratory: Negative.   Cardiovascular: Negative.   Gastrointestinal: Negative.   Genitourinary: Negative.   Musculoskeletal: Negative.   Skin: Negative.   Neurological: Negative.   Endo/Heme/Allergies: Negative.   Psychiatric/Behavioral: Positive for depression and substance abuse. The patient is nervous/anxious.     Blood pressure 150/103, pulse 82, temperature 98.1 F (36.7 C), temperature source Oral, resp. rate 24, height 5' 6.5" (1.689 m), weight 90.719 kg (200 lb).Body mass index is 31.8 kg/(m^2).  General Appearance: Disheveled  Eye Solicitor::  Fair  Speech:  Normal Rate  Volume:  Decreased  Mood:  Anxious and Depressed  Affect:  Congruent  Thought Process:   Coherent  Orientation:  Full (Time, Place, and Person)  Thought Content:  WDL  Suicidal Thoughts:  Yes.  without intent/plan  Homicidal Thoughts:  No  Memory:  Immediate;   Fair Recent;   Fair Remote;   Fair  Judgement:  Poor  Insight:  Lacking  Psychomotor Activity:  Decreased  Concentration:  Fair  Recall:  Fair  Akathisia:  No  Handed:  Right  AIMS (if indicated):     Assets:  Resilience  Sleep:  Number of Hours: 4.5   Current Medications: Current Facility-Administered Medications  Medication Dose Route Frequency Provider Last Rate Last Dose  . acetaminophen (TYLENOL) tablet 650 mg  650 mg Oral Q6H PRN Fransisca Kaufmann, NP   650 mg at 07/26/13 1733  . alum & mag hydroxide-simeth (MAALOX/MYLANTA) 200-200-20 MG/5ML suspension 30 mL  30 mL Oral Q4H PRN Fransisca Kaufmann, NP      . chlordiazePOXIDE (LIBRIUM) capsule 25 mg  25 mg Oral Q6H PRN Fransisca Kaufmann, NP      . chlordiazePOXIDE (LIBRIUM) capsule 25 mg  25 mg Oral BH-qamhs Fransisca Kaufmann, NP   25 mg at 07/28/13 4098   Followed by  . [START ON 07/29/2013] chlordiazePOXIDE (LIBRIUM) capsule 25 mg  25 mg Oral Daily Fransisca Kaufmann, NP      . cloNIDine (CATAPRES) tablet 0.1 mg  0.1 mg Oral BH-qamhs Fransisca Kaufmann, NP   0.1 mg at 07/28/13 1191   Followed by  . [START ON 07/30/2013] cloNIDine (CATAPRES) tablet 0.1 mg  0.1 mg Oral QAC breakfast Fransisca Kaufmann, NP      . dicyclomine (BENTYL) tablet 20 mg  20 mg Oral Q6H  PRN Fransisca Kaufmann, NP      . DULoxetine (CYMBALTA) DR capsule 30 mg  30 mg Oral BID Rachael Fee, MD   30 mg at 07/28/13 0981  . gabapentin (NEURONTIN) capsule 300 mg  300 mg Oral TID Rachael Fee, MD   300 mg at 07/28/13 1153  . hydrochlorothiazide (MICROZIDE) capsule 12.5 mg  12.5 mg Oral Daily Rachael Fee, MD   12.5 mg at 07/28/13 1914  . hydrOXYzine (ATARAX/VISTARIL) tablet 25 mg  25 mg Oral Q6H PRN Fransisca Kaufmann, NP   25 mg at 07/27/13 2217  . lidocaine (LIDODERM) 5 % 1 patch  1 patch Transdermal Daily Rachael Fee, MD   1 patch at  07/28/13 [phone]  . loperamide (IMODIUM) capsule 2-4 mg  2-4 mg Oral PRN Fransisca Kaufmann, NP      . loperamide (IMODIUM) capsule 2-4 mg  2-4 mg Oral PRN Fransisca Kaufmann, NP      . magnesium hydroxide (MILK OF MAGNESIA) suspension 30 mL  30 mL Oral Daily PRN Fransisca Kaufmann, NP      . methocarbamol (ROBAXIN) tablet 500 mg  500 mg Oral Q8H PRN Fransisca Kaufmann, NP   500 mg at 07/27/13 2217  . multivitamin with minerals tablet 1 tablet  1 tablet Oral Daily Fransisca Kaufmann, NP   1 tablet at 07/28/13 5621  . nabumetone (RELAFEN) tablet 750 mg  750 mg Oral BID Nanine Means, NP      . ondansetron (ZOFRAN-ODT) disintegrating tablet 4 mg  4 mg Oral Q6H PRN Fransisca Kaufmann, NP      . ondansetron (ZOFRAN-ODT) disintegrating tablet 4 mg  4 mg Oral Q6H PRN Fransisca Kaufmann, NP      . pantoprazole (PROTONIX) EC tablet 40 mg  40 mg Oral Daily Fransisca Kaufmann, NP   40 mg at 07/28/13 3086  . [START ON 07/29/2013] predniSONE (DELTASONE) tablet 30 mg  30 mg Oral Q breakfast Verne Spurr, PA-C       Followed by  . [START ON 07/30/2013] predniSONE (DELTASONE) tablet 20 mg  20 mg Oral Q breakfast Verne Spurr, PA-C       Followed by  . [START ON 07/31/2013] predniSONE (DELTASONE) tablet 10 mg  10 mg Oral Q breakfast Verne Spurr, PA-C      . thiamine (VITAMIN B-1) tablet 100 mg  100 mg Oral Daily Fransisca Kaufmann, NP   100 mg at 07/28/13 5784  . traZODone (DESYREL) tablet 50 mg  50 mg Oral QHS PRN,MR X 1 Fransisca Kaufmann, NP   50 mg at 07/27/13 2217    Lab Results: No results found for this or any previous visit (from the past 48 hour(s)).  Physical Findings: AIMS: Facial and Oral Movements Muscles of Facial Expression: None, normal Lips and Perioral Area: None, normal Jaw: None, normal Tongue: None, normal,Extremity Movements Upper (arms, wrists, hands, fingers): None, normal Lower (legs, knees, ankles, toes): None, normal, Trunk Movements Neck, shoulders, hips: None, normal, Overall Severity Severity of abnormal movements (highest score from  questions above): None, normal Incapacitation due to abnormal movements: None, normal Patient's awareness of abnormal movements (rate only patient's report): No Awareness, Dental Status Current problems with teeth and/or dentures?: No Does patient usually wear dentures?: No  CIWA:  CIWA-Ar Total: 3 COWS:     Treatment Plan Summary: Daily contact with patient to assess and evaluate symptoms and progress in treatment Medication management  Plan:  Review of chart, vital signs, medications, and notes. 1-Individual and group therapy  2-Medication management for depression, substance abuse, and anxiety:  Medications reviewed with the patient and naprosyn discontinued and Relafen ordered for left hip to left leg  3-Coping skills for depression, anxiety, and substance abuse 4-Continue crisis stabilization and management 5-Address health issues--monitoring vital signs, stable 6-Treatment plan in progress to prevent relapse of depression, substance abuse, and anxiety  Medical Decision Making Problem Points:  Established problem, stable/improving (1) and Review of psycho-social stressors (1) Data Points:  Review of medication regiment & side effects (2)  I certify that inpatient services furnished can reasonably be expected to improve the patient's condition.   Nanine Means, PMH-NP 07/28/2013, 3:08 PM Agree with assessment and plan Madie Reno A. Dub Mikes, M.D.

## 2013-07-28 NOTE — [facility] Group Notes (Signed)
[facility] LCSW Group Therapy  07/28/2013 3:25 PM  Type of Therapy:  Group Therapy  Participation Level:  Active  Participation Quality:  Attentive  Affect:  Appropriate  Cognitive:  Alert and Oriented  Insight:  Engaged  Engagement in Therapy:  Engaged  Modes of Intervention:  Discussion, Education, Exploration, Socialization and Support  Summary of Progress/Problems: Today's Topic: Overcoming Obstacles. Pt identified obstacles faced currently and processed barriers involved in overcoming these obstacles. Pt identified steps necessary for overcoming these obstacles and explored motivation (internal and external) for facing these difficulties head on. Pt further identified one area of concern in their lives and chose a skill of focus pulled from their "toolbox." Bryan Huffman was engaged and attentive throughout today's therapy group. Bryan Huffman stated that his tumultous relationship with his wife and his chronic back pain/inability to find work are his primary obstacles. Bryan Huffman shows progress in the group setting and improving insight AEB his ability to actively participate in the group setting and identify obstacles as well as concrete and measurable goals that he plans to attain after leaving [facility]. "get help with my back problems, go to couples' therapy, actively job hunt/temp agencies, and stay on top of taking my medications."    Smart, Stanislav Gervase 07/28/2013, 3:25 PM

## 2013-07-28 NOTE — [facility] Group Notes (Signed)
[facility] LCSW Aftercare Discharge Planning Group Note   07/28/2013 9:36 AM  Participation Quality:  Appropriate   Mood/Affect:  Appropriate  Depression Rating:  3  Anxiety Rating:  8  Thoughts of Suicide:  No Will you contract for safety?   NA  Current AVH:  No  Plan for Discharge/Comments:  Pt states that he is planning to return home after d/c to live with his parents in [place] [facility] side). Pt reports that he had high back pain and feels that if this were controlled, he would not have issues with depression/SI. Pt reports no withdrawal symptoms and does not want referral to therapist. Pt may require referral to [facility] for med management and is requesting appt to be made with back specialist.   Transportation Means: parents   Supports: parents and exwife.   Smart, [employer]

## 2013-07-29 DIAGNOSIS — F1994 Other psychoactive substance use, unspecified with psychoactive substance-induced mood disorder: Secondary | ICD-10-CM

## 2013-07-29 MED ORDER — KETOROLAC TROMETHAMINE 30 MG/ML IJ SOLN
15.0000 mg | Freq: Four times a day (QID) | INTRAMUSCULAR | Status: DC | PRN
Start: 2013-07-29 — End: 2013-07-30
  Administered 2013-07-29 – 2013-07-30 (×3): 15 mg via INTRAVENOUS
  Filled 2013-07-29 (×3): qty 1

## 2013-07-29 MED ORDER — KETOROLAC TROMETHAMINE 15 MG/ML IJ SOLN
15.0000 mg | Freq: Four times a day (QID) | INTRAMUSCULAR | Status: DC | PRN
  Filled 2013-07-29: qty 1

## 2013-07-29 NOTE — Progress Notes (Signed)
[facility] MD Progress Note  07/29/2013 11:47 AM Bryan Huffman  MRN:  [id number] Subjective:  Bryan Huffman's back pain is better. He is still upset with his ex wife as he is manipulating thing so he cant see his kids. He states that it boils down to him not having a job and not bien able to provide for her as she wants. He is going to stay with his parents. His main concern is not being able to have a follow up in place for his back. States that the pain when it hits is incapacitating. He has not been able to have things in place outside of here. States that if did not have the situation with his back going on, and have a job, he did not need to drink Diagnosis:   DSM5: Schizophrenia Disorders:   Obsessive-Compulsive Disorders:   Trauma-Stressor Disorders:   Substance/Addictive Disorders:  Alcohol Related Disorder - Severe (303.90) Depressive Disorders:  Major Depressive Disorder - Moderate (296.22)  Axis I: Substance Induced Mood Disorder  ADL's:  Intact  Sleep: Fair  Appetite:  Fair  Suicidal Ideation:  Plan:  denies Intent:  denies Means:  denies Homicidal Ideation:  Plan:  denies Intent:  denies Means:  denies AEB (as evidenced by):  Psychiatric Specialty Exam: Review of Systems  Constitutional: Negative.   HENT: Negative.   Eyes: Negative.   Respiratory: Negative.   Cardiovascular: Negative.   Gastrointestinal: Negative.   Genitourinary: Negative.   Musculoskeletal: Positive for back pain.  Skin: Negative.   Neurological: Negative.   Endo/Heme/Allergies: Negative.   Psychiatric/Behavioral: Positive for depression and substance abuse.    Blood pressure 145/88, pulse 80, temperature 98 F (36.7 C), temperature source Oral, resp. rate 18, height 5' 6.5" (1.689 m), weight 90.719 kg (200 lb).Body mass index is 31.8 kg/(m^2).  General Appearance: Fairly Groomed  Patent attorney::  Fair  Speech:  Clear and Coherent  Volume:  Normal  Mood:  Worried  Affect:  Appropriate  Thought  Process:  Coherent, Disorganized and Goal Directed  Orientation:  Full (Time, Place, and Person)  Thought Content:  somatically focused, worried, concerned  Suicidal Thoughts:  No  Homicidal Thoughts:  No  Memory:  Immediate;   Fair Recent;   Fair Remote;   Fair  Judgement:  Fair  Insight:  Present  Psychomotor Activity:  Restlessness  Concentration:  Fair  Recall:  Fair  Akathisia:  No  Handed:    AIMS (if indicated):     Assets:  Desire for Improvement  Sleep:  Number of Hours: 5.25   Current Medications: Current Facility-Administered Medications  Medication Dose Route Frequency Provider Last Rate Last Dose  . acetaminophen (TYLENOL) tablet 650 mg  650 mg Oral Q6H PRN Fransisca Kaufmann, NP   650 mg at 07/26/13 1733  . alum & mag hydroxide-simeth (MAALOX/MYLANTA) 200-200-20 MG/5ML suspension 30 mL  30 mL Oral Q4H PRN Fransisca Kaufmann, NP      . cloNIDine (CATAPRES) tablet 0.1 mg  0.1 mg Oral BH-qamhs Fransisca Kaufmann, NP   0.1 mg at 07/29/13 0751   Followed by  . [START ON 07/30/2013] cloNIDine (CATAPRES) tablet 0.1 mg  0.1 mg Oral QAC breakfast Fransisca Kaufmann, NP      . dicyclomine (BENTYL) tablet 20 mg  20 mg Oral Q6H PRN Fransisca Kaufmann, NP      . DULoxetine (CYMBALTA) DR capsule 30 mg  30 mg Oral BID Rachael Fee, MD   30 mg at 07/29/13 0753  . gabapentin (NEURONTIN)  capsule 300 mg  300 mg Oral TID Rachael Fee, MD   300 mg at 07/29/13 1125  . hydrochlorothiazide (MICROZIDE) capsule 12.5 mg  12.5 mg Oral Daily Rachael Fee, MD   12.5 mg at 07/29/13 0753  . lidocaine (LIDODERM) 5 % 1 patch  1 patch Transdermal Daily Rachael Fee, MD   1 patch at 07/29/13 [phone]  . loperamide (IMODIUM) capsule 2-4 mg  2-4 mg Oral PRN Fransisca Kaufmann, NP      . magnesium hydroxide (MILK OF MAGNESIA) suspension 30 mL  30 mL Oral Daily PRN Fransisca Kaufmann, NP      . methocarbamol (ROBAXIN) tablet 500 mg  500 mg Oral Q8H PRN Fransisca Kaufmann, NP   500 mg at 07/29/13 0756  . multivitamin with minerals tablet 1 tablet  1 tablet Oral  Daily Fransisca Kaufmann, NP   1 tablet at 07/29/13 0754  . nabumetone (RELAFEN) tablet 750 mg  750 mg Oral BID Nanine Means, NP   750 mg at 07/29/13 0751  . ondansetron (ZOFRAN-ODT) disintegrating tablet 4 mg  4 mg Oral Q6H PRN Fransisca Kaufmann, NP      . pantoprazole (PROTONIX) EC tablet 40 mg  40 mg Oral Daily Fransisca Kaufmann, NP   40 mg at 07/29/13 0754  . [START ON 07/30/2013] predniSONE (DELTASONE) tablet 20 mg  20 mg Oral Q breakfast Verne Spurr, PA-C       Followed by  . [START ON 07/31/2013] predniSONE (DELTASONE) tablet 10 mg  10 mg Oral Q breakfast Verne Spurr, PA-C      . thiamine (VITAMIN B-1) tablet 100 mg  100 mg Oral Daily Fransisca Kaufmann, NP   100 mg at 07/29/13 0753  . traZODone (DESYREL) tablet 50 mg  50 mg Oral QHS PRN,MR X 1 Fransisca Kaufmann, NP   50 mg at 07/28/13 2250    Lab Results: No results found for this or any previous visit (from the past 48 hour(s)).  Physical Findings: AIMS: Facial and Oral Movements Muscles of Facial Expression: None, normal Lips and Perioral Area: None, normal Jaw: None, normal Tongue: None, normal,Extremity Movements Upper (arms, wrists, hands, fingers): None, normal Lower (legs, knees, ankles, toes): None, normal, Trunk Movements Neck, shoulders, hips: None, normal, Overall Severity Severity of abnormal movements (highest score from questions above): None, normal Incapacitation due to abnormal movements: None, normal Patient's awareness of abnormal movements (rate only patient's report): No Awareness, Dental Status Current problems with teeth and/or dentures?: No Does patient usually wear dentures?: No  CIWA:  CIWA-Ar Total: 2 COWS:     Treatment Plan Summary: Daily contact with patient to assess and evaluate symptoms and progress in treatment Medication management  Plan: Supportive approach/coping skills/relapse prevention           Complete detox           Optimize pain management           Optimize treatment with psychotropics               Medical Decision Making Problem Points:  Review of psycho-social stressors (1) Data Points:  Review of medication regiment & side effects (2)  I certify that inpatient services furnished can reasonably be expected to improve the patient's condition.   Mackenzie Lia A 07/29/2013, 11:47 AM

## 2013-07-29 NOTE — Progress Notes (Signed)
Pt reports he has not had much improvement since yesterday.  He says he is still having significant pain in his back which radiates to his R hip.  He says this causes his anxiety to be elevated.  Pt denies SI/HI/AV.  He says he is not having withdrawal symptoms.  He says if he could get his pain level under control, he would not have a reason to drink.  He is frustrated that his ex is giving him a hard time about not having a job.  He also says he knows that overdosing on his medications was "a stupid thing to do".  Pt will return to his parent's home at discharge.  Pt makes his needs known to staff.  Order obtained for Toradol IM 15 mg q6h prn for moderate to extreme pain.  Pt given IM injection at this time.  Support and encouragement offered.   Safety maintained with q15 minute checks.

## 2013-07-29 NOTE — B[facility] Group Notes (Signed)
The focus of this group is to educate the patient on the purpose and policies of crisis stabilization and provide a format to answer questions about their admission.  The group details unit policies and expectations of patients while admitted. Pt participated in group and was calm and cooperative.

## 2013-07-29 NOTE — BHH Group Notes (Signed)
Adult Psychoeducational Group Note  Date:  07/29/2013 Time:  9:00 PM  Group Topic/Focus:  Wrap-Up Group:   The focus of this group is to help patients review their daily goal of treatment and discuss progress on daily workbooks.  Participation Level:  Active  Participation Quality:  Appropriate  Affect:  Appropriate  Cognitive:  Appropriate  Insight: Appropriate  Engagement in Group:  Engaged  Modes of Intervention:  Discussion  Additional Comments:  Numair stated his day was very painful because of his leg; other than that he had a good day, feels better and getting mind cleared up.  His parents pastor came to visit him and he talked to his son.  Caroll Rancher A 07/29/2013, 9:00 PM

## 2013-07-29 NOTE — BHH Group Notes (Signed)
B[facility] LCSW Group Therapy  07/29/2013 1:25 PM  Type of Therapy:  Group Therapy  Participation Level:  Active  Participation Quality:  Attentive  Affect:  Appropriate  Cognitive:  Alert and Oriented  Insight:  Engaged  Engagement in Therapy:  Engaged  Modes of Intervention:  Discussion, Education, Exploration, Socialization and Support  Summary of Progress/Problems: MHA Speaker came to talk about his personal journey with substance abuse and addiction. The pt processed ways by which to relate to the speaker. MHA speaker provided handouts and educational information pertaining to groups and services offered by the [facility]. Thorsten was engaged and attentive throughout today's group. He actively listened as speaker shared his personal experiences with MI and SA. Dermot presented with pleasant mood with anxious affect. Jafeth appeared fidgety and stated that he was experiencing moderate-severe back pain this afternoon. Styles shows progress in the group setting AEB his ability to follow along as speaker described various groups offered by [facility].    Smart, Derionna Salvador 07/29/2013, 1:25 PM

## 2013-07-29 NOTE — Progress Notes (Signed)
D: Patient denies SI/HI and A/V hallucinations; patient reports sleep is well; reports appetite is good; reports energy level is low ; reports ability to pay attention is good; rates depression as 2/10; rates hopelessness 3/10; rates anxiety as 2/10; patient has complaints about pain in his back radiating to his leg  A: Monitored q 15 minutes; patient encouraged to attend groups; patient educated about medications; patient given medications per physician orders; patient encouraged to express feelings and/or concerns  R: Patient is appropriate, flat, and sad.; patient's interaction with staff and peers is assertive about what he needs; patient was able to set goal to talk with staff 1:1 when having feelings of SI; patient is taking medications as prescribed and tolerating medications; patient is attending all groups and engaging

## 2013-07-29 NOTE — Progress Notes (Signed)
Recreation Therapy Notes  Date: 10.21.2014 Time: 3:00pm Location: [address] Day[address]  Group Topic: Communication, Team Building, Problem Solving  Goal Area(s) Addresses:  Patient will effectively work with peer towards shared goal.  Patient will identify skill used to make activity successful.  Patient will identify how skills used during activity can be used to reach post d/c goals.   Behavioral Response: Did not attend.  Marykay Lex Tuck Dulworth, LRT/CTRS  Kailiana Granquist L 07/29/2013 5:08 PM

## 2013-07-29 NOTE — Progress Notes (Signed)
Recreation Therapy Notes  Date: 10.21.2014 Time: 2:30pm Location: [address] Day[address]  Group Topic: Software engineer Activities (AAA)  Behavioral Response: Did not attend.   Marykay Lex Christean Silvestri, LRT/CTRS  Jearl Klinefelter 07/29/2013 4:28 PM

## 2013-07-30 MED ORDER — LIDOCAINE 5 % EX PTCH: 1.0000 | MEDICATED_PATCH | Freq: Every day | CUTANEOUS | Status: DC

## 2013-07-30 MED ORDER — TRAZODONE HCL 50 MG PO TABS: 50.0000 mg | ORAL_TABLET | Freq: Every evening | ORAL | Status: DC | PRN

## 2013-07-30 MED ORDER — PREDNISONE 10 MG PO TABS: 10.0000 mg | ORAL_TABLET | Freq: Every day | ORAL | Status: DC

## 2013-07-30 MED ORDER — GABAPENTIN 300 MG PO CAPS: 300.0000 mg | ORAL_CAPSULE | Freq: Three times a day (TID) | ORAL | Status: DC

## 2013-07-30 MED ORDER — HYDROCHLOROTHIAZIDE 12.5 MG PO TABS: 12.5000 mg | ORAL_TABLET | Freq: Every day | ORAL | Status: DC

## 2013-07-30 MED ORDER — OMEPRAZOLE 20 MG PO CPDR: 20.0000 mg | DELAYED_RELEASE_CAPSULE | Freq: Every day | ORAL | Status: DC

## 2013-07-30 MED ORDER — DULOXETINE HCL 30 MG PO CPEP: 30.0000 mg | ORAL_CAPSULE | Freq: Two times a day (BID) | ORAL | Status: DC

## 2013-07-30 MED ORDER — NABUMETONE 750 MG PO TABS: 750.0000 mg | ORAL_TABLET | Freq: Two times a day (BID) | ORAL | Status: DC

## 2013-07-30 NOTE — Progress Notes (Signed)
Patient ID: Bryan Huffman, male   DOB: [birth year], 42 y.o.   MRN: [id number] Pt. D/c per MD order. Gave pt. Samples and prescription after explaining what everything was. Gave pt. D/c paper and went over follow up appointments for [facility] and my chart.Medical follow up attempted, but unable to gain appointment at time of d/c, pt. Stated that he would continue to follow up regarding appointment,. No questions or concerns at time of d/c. Denies SI/HI/AVH. Pt. ambulatory and shows no signs of distress upon d/c.

## 2013-07-30 NOTE — Progress Notes (Signed)
Spoke with patient to follow up on his request to be set up with a neurosurgeon for complaint of left leg pain and numbness. The patient requested writer call [facility] to set up an appointment reporting that he has never been there but his brother has. Spoke with receptionist at [facility] who reported that this patient had been terminated from their practice in 2011. Informed patient of this information and patient stated "This is what I have been dealing with. I will have to go back to my MD and work this out myself." Dr. Dub Mikes made aware of this interaction.   Agree with assessment and plan Madie Reno A. Dub Mikes, M.D.

## 2013-07-30 NOTE — Progress Notes (Signed)
D:Pt. States he did not sleep well last night. Pt. Complains of lower back pain and some numbness in left knee. Pt. Stated he needs a referral to a specialist for his back pain. Pt. Denies SI/HI/AVH. Pt. Rates depression as a 2 out of 10 and hopelesness at a 1. Pt. Plans for going home are to stop drinking and eat right. Pt's son is set to pick him up at d/c.  A: Scheduled and PRN medication given. Support and encouragement offered. Q 15 min. Safety checks. Social worker notified about specialist request R: Pt. Remains safe on the unit.

## 2013-07-30 NOTE — BHH Group Notes (Signed)
[facility] LCSW Aftercare Discharge Planning Group Note   07/30/2013 9:27 AM  Participation Quality:  Appropriate   Mood/Affect:  Appropriate  Depression Rating:  2  Anxiety Rating:  5  Thoughts of Suicide:  No Will you contract for safety?   NA  Current AVH:  No  Plan for Discharge/Comments:  Pt reports that his pain is a 9/10 this morning and "no medications or shots are helping." Pt reports that he has been sleeping poorly but is ready for d/c today. Pt will follow up with [facility] for med management and therapy and is requesting medical appt be made by NP/PA for assessment for his back problems.  Transportation Means: father   Supports: parents   Counselling psychologist, [place]

## 2013-07-30 NOTE — BHH Suicide Risk Assessment (Signed)
Suicide Risk Assessment  Discharge Assessment     Demographic Factors:  Male and Caucasian  Mental Status Per Nursing Assessment::   On Admission:     Current Mental Status by Physician: In full contact with reality. There are no suicidal ideas, plans or intent. His mood is euthymic. His affect is appropriate. He has gotten some relief from his back pain. Looking forward to follow up with the neurosurgeon. He is planning not to go back to drinking. He is going to stay with his parents.   Loss Factors: Loss of significant relationship and Decline in physical health  Historical Factors: NA  Risk Reduction Factors:   Sense of responsibility to family, Living with another person, especially Huffman relative and Positive social support  Continued Clinical Symptoms:  Depression:   Comorbid alcohol abuse/dependence Alcohol/Substance Abuse/Dependencies  Cognitive Features That Contribute To Risk:  Polarized thinking Thought constriction (tunnel vision)    Suicide Risk:  Minimal: No identifiable suicidal ideation.  Patients presenting with no risk factors but with morbid ruminations; may be classified as minimal risk based on the severity of the depressive symptoms  Discharge Diagnoses:   AXIS I:  Major Depression, recurrent, Alcohol Dependence AXIS II:  Deferred AXIS III:   Past Medical History  Diagnosis Date  . Hypertension   . Back pain   . Insomnia   . Mental disorder   . Depression    AXIS IV:  other psychosocial or environmental problems and problems with access to health care services AXIS V:  51-60 moderate symptoms  Plan Of Care/Follow-up recommendations:  Activity:  as tolerated Diet:  regular Follow up outpatient basis Is patient on multiple antipsychotic therapies at discharge:  No   Has Patient had three or more failed trials of antipsychotic monotherapy by history:  No  Recommended Plan for Multiple Antipsychotic Therapies: NA  Bryan Huffman 07/30/2013,  12:17 PM

## 2013-07-30 NOTE — Progress Notes (Signed)
[facility] Adult Case Management Discharge Plan :  Will you be returning to the same living situation after discharge: Yes,  home with parents At discharge, do you have transportation home?:Yes,  father Do you have the ability to pay for your medications:Yes,  [facility]   Release of information consent forms completed and in the chart;  Patient's signature needed at discharge.  Patient to Follow up at: Follow-up Information   Follow up with [facility]. (Walk in between 8am-9am for hospital followup/medication management, and assessment for therapy services. )    Contact information:   [address], [place] [address] Phone; [phone] Fax: [phone]     PT also provided with info about [facility] but did not want referral made by CSW at this time.   Patient denies SI/HI:   Yes,  during group/self report    Safety Planning and Suicide Prevention discussed:  Yes,  SPE completed with pt's father. SPI pamphlet provided to pt and he was encouraged to share with his support system, ask questions, and talk about concerns.   Smart, Hodge Stachnik 07/30/2013, 10:52 AM

## 2013-08-04 NOTE — Discharge Summary (Signed)
Physician Discharge Summary Note  Patient:  Bryan Huffman is an 42 y.o., male MRN:  [id number] DOB:  1971/07/29 Patient phone:  [phone] (home)  Patient address:   [address] [place] [address],   Date of Admission:  07/25/2013 Date of Discharge: 08/01/2013  Reason for Admission:  42 Y/O male who was admitted after he overdosed on alcohol and a combination of lisinopril, Vicodin, Trazodone. States that he had been abstinent from crack since he was last admitted years ago. He has continued drinking. He had gone back to school and gotten a certificate to fix motorcycles. He had also had two back surgeries. He states things were going better for him and he started dating his ex wife. He had not been able to find a job. Stated that his wife got very upset with him due to the same and was insulting to him and kicked him out. He got increasingly more depressed, he overdosed.  Discharge Diagnoses: Principal Problem:   Alcohol dependence Active Problems:   Back pain   MDD (major depressive disorder)  Review of Systems  Constitutional: Negative.   HENT: Negative.   Eyes: Negative.   Respiratory: Negative.   Cardiovascular: Negative.   Gastrointestinal: Negative.   Genitourinary: Negative.   Musculoskeletal: Positive for back pain.       Leg pain  Skin: Negative.   Neurological: Negative.   Endo/Heme/Allergies: Negative.   Psychiatric/Behavioral: Positive for depression and substance abuse. The patient is nervous/anxious.     DSM5:  Schizophrenia Disorders:  None Obsessive-Compulsive Disorders:  None Trauma-Stressor Disorders:  None Substance/Addictive Disorders:  Alcohol Related Disorder - Severe (303.90) Depressive Disorders:  Major Depressive Disorder - Severe (296.23)  Axis Diagnosis:   AXIS I:  Substance Induced Mood Disorder AXIS II:  Deferred AXIS III:   Past Medical History  Diagnosis Date  . Hypertension   . Back pain   . Insomnia   . Mental  disorder   . Depression    AXIS IV:  housing problems, occupational problems and problems with primary support group AXIS V:  51-60 moderate symptoms  Level of Care:  OP  Hospital Course:   He was admitted and started in individual and group psychotherapy. Initially he was somatically focused complaining of pain suggesting radiculopathy. He could not get out of bed. He was given Neurontin, Cymbalta, Ibuprofen and a lidocaine patch with some but not much improvement. He was assessed by Shelda Jakes, PA who recommended a taper of prednisone. This approach was effective. He was able to get out of bed and participate of the unit activities. He was detox with Librium. He was still having some conflictive interactions with his ex wife around visitation with his kids. He was able to work on his coping skills, stress management. He put together a relapse prevention plan. He expressed readiness to be discharged. As he was in full contact with reality and expressed no suicidal ideas plans or intent, and he was fully detox, and committed to abstinence he was discharged to his parents house.   Consults:  None  Significant Diagnostic Studies:  As per the chart  Discharge Vitals:   Blood pressure 145/92, pulse 86, temperature 96.8 F (36 C), temperature source Oral, resp. rate 22, height 5' 6.5" (1.689 m), weight 90.719 kg (200 lb). Body mass index is 31.8 kg/(m^2). Lab Results:   No results found for this or any previous visit (from the past 72 hour(s)).  Physical Findings: AIMS: Facial and Oral Movements  Muscles of Facial Expression: None, normal Lips and Perioral Area: None, normal Jaw: None, normal Tongue: None, normal,Extremity Movements Upper (arms, wrists, hands, fingers): None, normal Lower (legs, knees, ankles, toes): None, normal, Trunk Movements Neck, shoulders, hips: None, normal, Overall Severity Severity of abnormal movements (highest score from questions above): None,  normal Incapacitation due to abnormal movements: None, normal Patient's awareness of abnormal movements (rate only patient's report): No Awareness, Dental Status Current problems with teeth and/or dentures?: No Does patient usually wear dentures?: No  CIWA:  CIWA-Ar Total: 1 COWS:     Psychiatric Specialty Exam: See Psychiatric Specialty Exam and Suicide Risk Assessment completed by Attending Physician prior to discharge.  Discharge destination:  Home  Is patient on multiple antipsychotic therapies at discharge:  No   Has Patient had three or more failed trials of antipsychotic monotherapy by history:  No  Recommended Plan for Multiple Antipsychotic Therapies: NA     Medication List    STOP taking these medications       HYDROcodone-acetaminophen 5-325 MG per tablet  Commonly known as:  NORCO/VICODIN     lisinopril 30 MG tablet  Commonly known as:  PRINIVIL,ZESTRIL     naproxen sodium 220 MG tablet  Commonly known as:  ANAPROX     STERAPRED 12 DAY PO  Replaced by:  predniSONE 10 MG tablet      TAKE these medications     Indication   DULoxetine 30 MG capsule  Commonly known as:  CYMBALTA  Take 1 capsule (30 mg total) by mouth 2 (two) times daily.   Indication:  Major Depressive Disorder, Musculoskeletal Pain     gabapentin 300 MG capsule  Commonly known as:  NEURONTIN  Take 1 capsule (300 mg total) by mouth 3 (three) times daily.   Indication:  Agitation, Alcohol Withdrawal Syndrome, Neurogenic Pain     hydrochlorothiazide 12.5 MG tablet  Commonly known as:  HYDRODIURIL  Take 1 tablet (12.5 mg total) by mouth daily.   Indication:  Elevated Blood Pressure     ibuprofen 200 MG tablet  Commonly known as:  ADVIL,MOTRIN  Take 200 mg by mouth every 6 (six) hours as needed for pain.      lidocaine 5 %  Commonly known as:  LIDODERM  Place 1 patch onto the skin daily. Remove & Discard patch within 12 hours or as directed by MD      nabumetone 750 MG tablet   Commonly known as:  RELAFEN  Take 1 tablet (750 mg total) by mouth 2 (two) times daily.   Indication:  Joint Damage causing Pain and Loss of Function     omeprazole 20 MG capsule  Commonly known as:  PRILOSEC  Take 1 capsule (20 mg total) by mouth daily.   Indication:  Gastroesophageal Reflux Disease with Current Symptoms     predniSONE 10 MG tablet  Commonly known as:  DELTASONE  Take 1 tablet (10 mg total) by mouth daily with breakfast.   Indication:  Sciatica     traZODone 50 MG tablet  Commonly known as:  DESYREL  Take 1 tablet (50 mg total) by mouth at bedtime as needed and may repeat dose one time if needed for sleep.   Indication:  Trouble Sleeping           Follow-up Information   Follow up with [facility]. (Walk in between 8am-9am for hospital followup/medication management, and assessment for therapy services. )    Contact information:   [address] [place], [place] [address] Phone; [phone] Fax: [phone]      Follow-up recommendations:  Activity:  as tolerated Diet:  regular  Comments:  Follow up with your PCP so they can refer you to a neurosurgeon  Total Discharge Time:  Greater than 30 minutes.  Signed: Janesha Brissette A 08/04/2013, 9:14 PM

## 2013-08-04 NOTE — Progress Notes (Signed)
Patient Discharge Instructions:  After Visit Summary (AVS):   Faxed to:  08/04/13 Psychiatric Admission Assessment Note:   Faxed to:  08/04/13 Suicide Risk Assessment - Discharge Assessment:   Faxed to:  08/04/13 Faxed/Sent to the Next Level Care provider:  08/04/13 Faxed to [facility] @ [phone]  Jerelene Redden, 08/04/2013, 3:38 PM

## 2013-08-13 ENCOUNTER — Other Ambulatory Visit: Payer: Self-pay | Admitting: Family Medicine

## 2013-08-13 DIAGNOSIS — M519 Unspecified thoracic, thoracolumbar and lumbosacral intervertebral disc disorder: Secondary | ICD-10-CM

## 2013-08-18 ENCOUNTER — Ambulatory Visit
Admission: RE | Admit: 2013-08-18 | Discharge: 2013-08-18 | Disposition: A | Discharge status: Home / Self Care | Payer: Medicaid Other | Source: Ambulatory Visit | Attending: Family Medicine | Admitting: Family Medicine

## 2013-08-18 DIAGNOSIS — M519 Unspecified thoracic, thoracolumbar and lumbosacral intervertebral disc disorder: Secondary | ICD-10-CM

## 2013-08-18 MED ORDER — GADOBENATE DIMEGLUMINE 529 MG/ML IV SOLN
19.0000 mL | Freq: Once | INTRAVENOUS | Status: AC | PRN
  Administered 2013-08-18: 19 mL via INTRAVENOUS

## 2013-08-20 ENCOUNTER — Emergency Department (HOSPITAL_COMMUNITY)
Admission: EM | Admit: 2013-08-20 | Discharge: 2013-08-20 | Disposition: A | Discharge status: Home / Self Care | Payer: Medicaid Other | Attending: Emergency Medicine | Admitting: Emergency Medicine

## 2013-08-20 ENCOUNTER — Emergency Department (HOSPITAL_COMMUNITY): Payer: Medicaid Other

## 2013-08-20 DIAGNOSIS — IMO0002 Reserved for concepts with insufficient information to code with codable children: Secondary | ICD-10-CM | POA: Insufficient documentation

## 2013-08-20 DIAGNOSIS — Z79899 Other long term (current) drug therapy: Secondary | ICD-10-CM | POA: Insufficient documentation

## 2013-08-20 DIAGNOSIS — M545 Low back pain, unspecified: Secondary | ICD-10-CM | POA: Insufficient documentation

## 2013-08-20 DIAGNOSIS — F17200 Nicotine dependence, unspecified, uncomplicated: Secondary | ICD-10-CM | POA: Insufficient documentation

## 2013-08-20 DIAGNOSIS — F329 Major depressive disorder, single episode, unspecified: Secondary | ICD-10-CM | POA: Insufficient documentation

## 2013-08-20 DIAGNOSIS — I10 Essential (primary) hypertension: Secondary | ICD-10-CM | POA: Insufficient documentation

## 2013-08-20 DIAGNOSIS — M549 Dorsalgia, unspecified: Secondary | ICD-10-CM

## 2013-08-20 DIAGNOSIS — F3289 Other specified depressive episodes: Secondary | ICD-10-CM | POA: Insufficient documentation

## 2013-08-20 MED ORDER — OXYCODONE-ACETAMINOPHEN 5-325 MG PO TABS: 1.0000 | ORAL_TABLET | ORAL | Status: DC | PRN

## 2013-08-20 MED ORDER — HYDROMORPHONE HCL PF 1 MG/ML IJ SOLN
1.0000 mg | Freq: Once | INTRAMUSCULAR | Status: AC
  Administered 2013-08-20: 1 mg via INTRAVENOUS
  Filled 2013-08-20: qty 1

## 2013-08-20 MED ORDER — DIAZEPAM 5 MG PO TABS: 5.0000 mg | ORAL_TABLET | Freq: Two times a day (BID) | ORAL | Status: DC

## 2013-08-20 MED ORDER — KETOROLAC TROMETHAMINE 30 MG/ML IJ SOLN
30.0000 mg | Freq: Once | INTRAMUSCULAR | Status: AC
  Administered 2013-08-20: 30 mg via INTRAVENOUS
  Filled 2013-08-20: qty 1

## 2013-08-20 NOTE — ED Provider Notes (Signed)
CSN: [id number]     Arrival date & time 08/20/13  1201 History   First MD Initiated Contact with Patient 08/20/13 1204     Chief Complaint  Patient presents with  . Back Pain   (Consider location/radiation/quality/duration/timing/severity/associated sxs/prior Treatment) Patient is a 42 y.o. male presenting with back pain. The history is provided by the patient.  Back Pain Location:  Lumbar spine Quality:  Aching Radiates to:  L knee Pain severity:  Severe Pain is:  Same all the time Onset quality:  Gradual Timing:  Constant Progression:  Worsening Chronicity:  New Context: not emotional stress, not falling, not recent injury and not twisting   Relieved by:  Nothing Worsened by:  Nothing tried Ineffective treatments:  None tried Associated symptoms: no fever     Past Medical History  Diagnosis Date  . Hypertension   . Back pain   . Insomnia   . Mental disorder   . Depression    Past Surgical History  Procedure Laterality Date  . Tonsillectomy    . Back surgery  1998,2008    L3 L4 L5 fusion   No family history on file. History  Substance Use Topics  . Smoking status: Current Every Day Smoker -- 1.00 packs/day    Types: Cigarettes  . Smokeless tobacco: Not on file  . Alcohol Use: Yes    Review of Systems  Constitutional: Negative for fever.  Respiratory: Negative for cough and shortness of breath.   Musculoskeletal: Positive for back pain.  All other systems reviewed and are negative.    Allergies  Review of patient's allergies indicates no known allergies.  Home Medications   Current Outpatient Rx  Name  Route  Sig  Dispense  Refill  . DULoxetine (CYMBALTA) 30 MG capsule   Oral   Take 1 capsule (30 mg total) by mouth 2 (two) times daily.   60 capsule   0   . gabapentin (NEURONTIN) 300 MG capsule   Oral   Take 1 capsule (300 mg total) by mouth 3 (three) times daily.   90 capsule   0   . hydrochlorothiazide (HYDRODIURIL) 12.5 MG tablet    Oral   Take 1 tablet (12.5 mg total) by mouth daily.         Marland Kitchen ibuprofen (ADVIL,MOTRIN) 200 MG tablet   Oral   Take 200 mg by mouth every 6 (six) hours as needed for pain.         Marland Kitchen lidocaine (LIDODERM) 5 %   Transdermal   Place 1 patch onto the skin daily. Remove & Discard patch within 12 hours or as directed by MD   30 patch   0   . nabumetone (RELAFEN) 750 MG tablet   Oral   Take 1 tablet (750 mg total) by mouth 2 (two) times daily.   60 tablet   0   . omeprazole (PRILOSEC) 20 MG capsule   Oral   Take 1 capsule (20 mg total) by mouth daily.         . predniSONE (DELTASONE) 10 MG tablet   Oral   Take 1 tablet (10 mg total) by mouth daily with breakfast.   1 tablet   0   . traZODone (DESYREL) 50 MG tablet   Oral   Take 1 tablet (50 mg total) by mouth at bedtime as needed and may repeat dose one time if needed for sleep.   60 tablet   0    BP 159/87  Pulse 76  Temp(Src) 97.9 F (36.6 C) (Oral)  Resp 30  SpO2 100% Physical Exam  Nursing note and vitals reviewed. Constitutional: He is oriented to person, place, and time. He appears well-developed and well-nourished. No distress.  HENT:  Head: Normocephalic and atraumatic.  Mouth/Throat: No oropharyngeal exudate.  Eyes: EOM are normal. Pupils are equal, round, and reactive to light.  Neck: Normal range of motion. Neck supple.  Cardiovascular: Normal rate and regular rhythm.  Exam reveals no friction rub.   No murmur heard. Pulmonary/Chest: Effort normal and breath sounds normal. No respiratory distress. He has no wheezes. He has no rales.  Abdominal: Soft. He exhibits no distension. There is no tenderness. There is no rebound.  Musculoskeletal: Normal range of motion. He exhibits no edema.  Neurological: He is alert and oriented to person, place, and time. No cranial nerve deficit. He exhibits normal muscle tone. Coordination normal.  Skin: No rash noted. He is not diaphoretic.    ED Course  Procedures  (including critical care time) Labs Review Labs Reviewed - No data to display Imaging Review Mr Lumbar Spine W Wo Contrast  08/19/2013   CLINICAL DATA:  Chronic low back pain with increasing left leg pain for 6 months.  EXAM: MRI LUMBAR SPINE WITHOUT AND WITH CONTRAST  TECHNIQUE: Multiplanar and multiecho pulse sequences of the lumbar spine were obtained without and with intravenous contrast.  CONTRAST:  19mL MULTIHANCE GADOBENATE DIMEGLUMINE 529 MG/ML IV SOLN  COMPARISON:  05/25/2009.  FINDINGS: The vertebral bodies of the lumbar spine are normal in size. There is minimal grade 1 anterolisthesis of L3 on L4. There is normal bone marrow signal demonstrated throughout the vertebra. There is posterior spinal fusion from L3 through L5 with bilateral pedicle screws at each level without hardware failure or complication. There are no areas of abnormal enhancement.  The spinal cord is normal in signal and contour. The cord terminates normally at T12-L1. . The nerve roots of the cauda equina and the filum terminale are normal.  The visualized portions of the SI joints are unremarkable.  The imaged intra-abdominal contents are unremarkable.  T12-L1: No significant disc bulge. No evidence of neural foraminal stenosis. No central canal stenosis.  L1-L2: No significant disc bulge. No evidence of neural foraminal stenosis. No central canal stenosis.  L2-L3: Mild broad-based disc bulge with a superimposed moderate left paracentral disc protrusion. Measuring approximately 12 x 8.6 mm with mild inferior migration of disc material and mass effect upon the left intraspinal L3 nerve root. There is mild-moderate bilateral foraminal stenosis.  L3-L4 Mild broad-based disc bulge. Evaluation of the neural foramina is somewhat limited secondary to patient motion and susceptibility artifact resulting from the orthopedic hardware. There is mild bilateral foraminal stenosis. No evidence of neural foraminal stenosis. There is bilateral  facet arthropathy. There is mild spinal stenosis and deformity of the right paracentral posterior thecal sac. Prior L3-L4 laminectomy.  L4-L5: Mild broad-based disc bulge. No significant foraminal stenosis. No evidence of neural foraminal stenosis. No central canal stenosis. Prior L4-L5 laminectomy.  L5-S1 Mild broad-based disc bulge. No evidence of neural foraminal stenosis. No central canal stenosis.  IMPRESSION: 1. At L2-L3 there is a mild broad-based disc bulge with a superimposed moderate left paracentral disc protrusion. Measuring approximately 12 x 8.6 mm with mild inferior migration of disc material and mass effect upon the left intraspinal L3 nerve root. There is mild-moderate bilateral foraminal stenosis.  2.  Lumbar spine spondylosis as described above.   Electronically Signed   By:  Elige Ko   On: 08/19/2013 08:41    EKG Interpretation   None       MDM   1. Back pain    73M presents with back pain. Sent from Dr. Fredirick [facility] for back pain. Recent MRI showed L2-L3 broad based disc bluge with moderate L paracentral disc protrusion. Associated mas effect on L3. Hx of L3-L4 and L4-L5 fusion. Patient ambulatory here, NVI. Given demerol, valium, phenergan by his PCP. NSR consulted, likely will not go to the OR today. NSR states he can f/u in clinic. Dr. Newell Coral asked ro CT Lumbar spine without contrast for operative planning. Given percocet and valium and discharge home.     Dagmar Hait, MD 08/20/13 [phone]

## 2013-08-20 NOTE — ED Notes (Addendum)
Pt reports to the ED for eval of lower back pain. Pt was seen at his PCPs office and had an MRI and was referred here for a neurosurgery consult. Pt had 50 of Demerol, 250 mcgs of Fentanyl, 25 of Phenergan, 5 mg of Valium. Pt has hx of several back surgeries. Pt reports the pain starts into his lower back and radiates into his knee. Pt also reports numbness to the left knee. Pt denies any bowel or bladder incontinence, paralysis, or tingling. Pt ambulatory with some difficulty r/t pain. Pt A&O x4. Skin warm and dry at this time. Resp e/u. Upon EMS arrival pt was diaphoretic and flushed. Pt still flushed at this time. Pt ambulated to restroom with minimal difficulty.

## 2013-10-07 ENCOUNTER — Encounter (HOSPITAL_COMMUNITY): Payer: Self-pay

## 2013-10-08 NOTE — H&P (Signed)
History of Present Illness  The patient is a 42 year old male who presents with back pain. symptoms including low back pain (to the left and down the left leg for about 1.5 months) following a specific injury ("have had a couple of fall from a ladder due tothe leg giving away"). and Symptoms include pain, decreased range of motion, numbness (anterior left knee) and weakness, while symptoms do not include incontinence of urine (but has trouble getting started). The patient describes the pain as sharp, burning and throbbing. The patient describes the severity of their symptoms as severe. The patient feels as if the symptoms are worsening. Symptoms are exacerbated by standing, sitting and bending. Current treatment includes opioid analgesics (Percocet 7.5/325 1 qid), muscle relaxants (Valium 5mg  , received 20 tabs at [facility] ED for back pain last week) and activity modification. Prior to being seen today the patient was previously evaluated by a colleague (Dr Aida Puffer at [facility] and has had an MRI and CT scan of his back at [facility]). Past treatment has included back surgery (Back surgery in 1998 by Dr Dutch Quint and again in 2008 by Dr Trey Sailors, but has subsequently been discharged from that practice).     Subjective Transcription  He presents today with longstanding severe back, buttock and left leg pain. He has had two previous spinal fusions L3-4, L4-5, the first by Dr. Dutch Quint, the second by Dr. Channing Mutters. The patient continued to have severe disabling pain and then almost a year ago he started having increasing left leg pain. The patient did attempt to see both Dr. Channing Mutters and Dr. Dutch Quint but he has indicated he was terminated from their group. The patient presents to me at the request of Dr. Clarene Duke who I have had an opportunity to speak with concerning his care.    His past medical history also includes polysubstance abuse, hypertension, chronic pain and he has not been in formal  pain management. He had an MRI and CT scan. The MRI was done 08/18/13, lumbar MRI shows a left disc herniation at 2-3 causing mass effect on the traversing L3 nerve root. He has hardware artifact at L3-4 and L4-5, no significant evidence of foraminal or lateral recess or central stenosis. Hardware is in good position and mild degenerative changes at L5-S1. CT scan done on 08/20/13 shows all the hardware is in good position. He has a solid arthrodesis from L3 to L5. No intervertebral cage at L4-5. Again noted at L2-3 the moderate to large size left disc protrusion extending caudally.     Allergies No Known Drug Allergies. 08/29/2013    Family History Cerebrovascular Accident. grandmother mothers side Diabetes Mellitus. father Drug / Alcohol Addiction. grandfather fathers side Heart Disease. father and grandfather mothers side Hypertension. father, grandmother mothers side, grandfather mothers side, grandmother fathers side and grandfather fathers side Kidney disease. father    Social History Alcohol use. current drinker; drinks beer, wine and hard liquor; only occasionally per week Children. 1 Current work status. unemployed Drug/Alcohol Rehab (Currently). no Drug/Alcohol Rehab (Previously). yes Exercise. Exercises never; does running / walking Illicit drug use. yes Living situation. live with parents Marital status. divorced Number of flights of stairs before winded. less than 1 Pain Contract. no Tobacco / smoke exposure. no Tobacco use. current every day smoker; smoke(d) 3/4 pack(s) per day    Medication History Oxycodone-Acetaminophen (5-325MG  Tablet, Oral) Active. (5 qd//Rx'd by Dr Aida Puffer) Diazepam (5MG  Tablet, Oral) Active. (prn//Rx by [facility] ED) Lisinopril (  30MG  Tablet, Oral) Active. (qd) Omeprazole (20MG  Tablet DR, Oral) Active. (qd) OLANZapine (2.5MG  Tablet, Oral) Active. (qd) Relafen (500MG  Tablet, Oral) Active. (prn) Naproxen (500MG   Tablet, Oral) Active. (prn) Medications Reconciled.    Past Surgical History Spinal Fusion. lower back Spinal Surgery Tonsillectomy    Other Problems Anxiety Disorder Chronic Pain Depression Gastroesophageal Reflux Disease High blood pressure Sleep Apnea    Review of Systems General:Not Present- Chills, Fever, Night Sweats, Appetite Loss, Fatigue, Feeling sick, Weight Gain and Weight Loss. Skin:Not Present- Itching, Rash, Skin Color Changes, Ulcer, Psoriasis and Change in Hair or Nails. HEENT:Not Present- Sensitivity to light, Hearing problems, Nose Bleed and Ringing in the Ears. Neck:Not Present- Swollen Glands and Neck Mass. Respiratory:Present- Snoring. Not Present- Chronic Cough, Bloody sputum and Dyspnea. Cardiovascular:Present- Leg Cramps. Not Present- Shortness of Breath, Chest Pain, Swelling of Extremities and Palpitations. Gastrointestinal:Present- Heartburn. Not Present- Bloody Stool, Abdominal Pain, Vomiting, Nausea and Incontinence of Stool. Male Genitourinary:Not Present- Blood in Urine, Frequency, Incontinence and Nocturia. Musculoskeletal:Present- Muscle Weakness, Muscle Pain, Joint Stiffness, Joint Pain and Back Pain. Not Present- Joint Swelling. Neurological:Present- Numbness, Burning and Tremor. Not Present- Tingling, Headaches and Dizziness. Psychiatric:Present- Anxiety and Depression. Not Present- Memory Loss. Endocrine:Not Present- Cold Intolerance, Heat Intolerance, Excessive hunger and Excessive Thirst. Hematology:Not Present- Abnormal Bleeding, Anemia, Blood Clots and Easy Bruising.    Vitals 08/29/2013 10:16 AM Weight: 197 lb Height: 64.5 in Body Surface Area: 2.02 m Body Mass Index: 33.29 kg/m Pulse: 89 (Regular) BP: 150/92 (Sitting, Left Arm, Standard)     Objective Transcription  He is a pleasant gentleman in obvious distress. He is alert and oriented times three. He has pain when he arises out of a  seated position. He can ambulate with no significant gait abnormality. He has 5/5 strength in the lower extremity. No gross motor deficits. He has significant sensory dysesthesias and numbness in the L3 distribution. Negative Babinski test. No clonus. He denies a history of incontinence of bowel and bladder. Abdomen is soft, nontender. No shortness of breath or chest pain.     Assessment & Plan  At this point in time I have had a long conversation with the patient and his father both of whom are present for the dictation. Given his psychiatric history and the issues with medications I myself am not comfortable with prescribing narcotic medications. I do think that the disc herniation more than likely will require surgical intervention. My hope would be to do a straight forward simple lumbar decompression and avoid the need to extend his fusion up to the L2-3 level. However, before we entertain surgery he would either need referral to a pain management specialist so that postoperatively his pain control can be managed by the pain specialist or if Dr. Clarene Duke is comfortable having Dr. Clarene Duke manage his postoperative pain medications. Once we have that set up then we can discuss proceeding either with injection therapy or with surgery. In discussing with the patient he has indicated he has had injections in the past and he is really not looking forward to further injections. Given the size of that disc fragment and the duration of his symptoms it is not unreasonable to proceed with a lumbar decompression. Therefore once we have his pain management set up I will see him back and we will plan on a surgical procedure.

## 2013-10-10 ENCOUNTER — Encounter (HOSPITAL_COMMUNITY)
Admission: RE | Admit: 2013-10-10 | Discharge: 2013-10-10 | Disposition: A | Discharge status: Home / Self Care | Payer: Medicaid Other | Source: Ambulatory Visit | Attending: Anesthesiology | Admitting: Anesthesiology

## 2013-10-10 ENCOUNTER — Encounter (HOSPITAL_COMMUNITY)
Admission: RE | Admit: 2013-10-10 | Discharge: 2013-10-10 | Disposition: A | Discharge status: Home / Self Care | Payer: Medicaid Other | Source: Ambulatory Visit | Attending: Orthopedic Surgery | Admitting: Orthopedic Surgery

## 2013-10-10 ENCOUNTER — Encounter (HOSPITAL_COMMUNITY): Payer: Self-pay

## 2013-10-10 DIAGNOSIS — Z01812 Encounter for preprocedural laboratory examination: Secondary | ICD-10-CM | POA: Insufficient documentation

## 2013-10-10 DIAGNOSIS — Z01818 Encounter for other preprocedural examination: Secondary | ICD-10-CM | POA: Insufficient documentation

## 2013-10-10 HISTORY — DX: Unspecified osteoarthritis, unspecified site: M19.90

## 2013-10-10 HISTORY — DX: Gastro-esophageal reflux disease without esophagitis: K21.9

## 2013-10-10 HISTORY — DX: Unspecified hemorrhoids: K64.9

## 2013-10-10 LAB — CBC
HCT: 42.8 % (ref 39.0–52.0)
Hemoglobin: 14.5 g/dL (ref 13.0–17.0)
MCH: 30.4 pg (ref 26.0–34.0)
MCHC: 33.9 g/dL (ref 30.0–36.0)
MCV: 89.7 fL (ref 78.0–100.0)
Platelets: 286 10*3/uL (ref 150–400)
RBC: 4.77 MIL/uL (ref 4.22–5.81)
RDW: 13.5 % (ref 11.5–15.5)
WBC: 10.1 10*3/uL (ref 4.0–10.5)

## 2013-10-10 LAB — COMPREHENSIVE METABOLIC PANEL
ALBUMIN: 4.2 g/dL (ref 3.5–5.2)
ALK PHOS: 68 U/L (ref 39–117)
ALT: 46 U/L (ref 0–53)
AST: 25 U/L (ref 0–37)
BUN: 18 mg/dL (ref 6–23)
CHLORIDE: 97 meq/L (ref 96–112)
CO2: 25 mEq/L (ref 19–32)
Calcium: 9.6 mg/dL (ref 8.4–10.5)
Creatinine, Ser: 0.66 mg/dL (ref 0.50–1.35)
GFR calc Af Amer: >90 mL/min (ref >90)
GFR calc non Af Amer: >90 mL/min (ref >90)
Glucose, Bld: 96 mg/dL (ref 70–99)
POTASSIUM: 4.8 meq/L (ref 3.7–5.3)
SODIUM: 137 meq/L (ref 137–147)
Total Bilirubin: <0.2 mg/dL — ABNORMAL LOW (ref 0.3–1.2)
Total Protein: 7.9 g/dL (ref 6.0–8.3)

## 2013-10-10 LAB — SURGICAL PCR SCREEN
MRSA, PCR: NEGATIVE
Staphylococcus aureus: POSITIVE — AB

## 2013-10-10 NOTE — Pre-Procedure Instructions (Addendum)
Bryan GambleJody A Huffman  10/10/2013   Your procedure is scheduled on: Wednesday, January 7.  [facility] Entrance "A" at 6:30AM.  Call this number if you have problems the morning of surgery: [phone]   Remember:   Do not eat food or drink liquids after midnight, Tuesday, January 6.   Take these medicines the morning of surgery with A SIP OF WATER:  omeprazole (PRILOSEC), gabapentin (NEURONTIN), Cymbalta, Metoprolol.   Take if needed:oxyCODONE-acetaminophen (PERCOCET).  Stop taking NAPROSYN,nabumetone (RELAFEN)    Do not wear jewelry, make-up or nail polish.  Do not wear lotions, powders, or perfumes. You may wear deodorant.   Men may shave face and neck.  Do not bring valuables to the hospital.  [facility] is not responsible  for any belongings or valuables.               Contacts, dentures or bridgework may not be worn into surgery.  Leave suitcase in the car. After surgery it may be brought to your room.  For patients admitted to the hospital, discharge time is determined by your treatment team.               Patients discharged the day of surgery will not be allowed to drive home.  Name and phone number of your driver: -   Special Instructions: Shower using CHG 2 nights before surgery and the night before surgery.  If you shower the day of surgery use CHG.  Use special wash - you have one bottle of CHG for all showers.  You should use approximately 1/3 of the bottle for each shower.   Please read over the following fact sheets that you were given: Pain Booklet, Coughing and Deep Breathing and Surgical Site Infection Prevention

## 2013-10-10 NOTE — Progress Notes (Signed)
10/10/13 0929  OBSTRUCTIVE SLEEP APNEA  Have you ever been diagnosed with sleep apnea through a sleep study? No  Do you snore loudly (loud enough to be heard through closed doors)?  1  Do you often feel tired, fatigued, or sleepy during the daytime? 1  Has anyone observed you stop breathing during your sleep? 1  Do you have, or are you being treated for high blood pressure? 1  BMI more than 35 kg/m2? 0  Age over 45 years old? 0  Neck circumference greater than 40 cm/18 inches? 0 (18)  Gender: 1  Obstructive Sleep Apnea Score 5  Score 4 or greater  Results sent to PCP

## 2013-10-14 MED ORDER — CEFAZOLIN SODIUM-DEXTROSE 2-3 GM-% IV SOLR
2.0000 g | INTRAVENOUS | Status: AC
  Administered 2013-10-15: 2 g via INTRAVENOUS
  Filled 2013-10-14: qty 50

## 2013-10-15 ENCOUNTER — Ambulatory Visit (HOSPITAL_COMMUNITY): Payer: Medicaid Other | Admitting: Anesthesiology

## 2013-10-15 ENCOUNTER — Encounter (HOSPITAL_COMMUNITY): Payer: Medicaid Other | Admitting: Anesthesiology

## 2013-10-15 ENCOUNTER — Ambulatory Visit (HOSPITAL_COMMUNITY): Payer: Medicaid Other

## 2013-10-15 ENCOUNTER — Encounter (HOSPITAL_COMMUNITY): Payer: Self-pay | Admitting: *Deleted

## 2013-10-15 ENCOUNTER — Ambulatory Visit (HOSPITAL_COMMUNITY)
Admission: RE | Admit: 2013-10-15 | Discharge: 2013-10-16 | Disposition: A | Discharge status: Home / Self Care | Payer: Medicaid Other | Source: Ambulatory Visit | Attending: Orthopedic Surgery | Admitting: Orthopedic Surgery

## 2013-10-15 ENCOUNTER — Encounter (HOSPITAL_COMMUNITY)
Admission: RE | Disposition: A | Discharge status: Home / Self Care | Payer: Self-pay | Source: Ambulatory Visit | Attending: Orthopedic Surgery

## 2013-10-15 DIAGNOSIS — J449 Chronic obstructive pulmonary disease, unspecified: Secondary | ICD-10-CM | POA: Insufficient documentation

## 2013-10-15 DIAGNOSIS — Z9889 Other specified postprocedural states: Secondary | ICD-10-CM

## 2013-10-15 DIAGNOSIS — K219 Gastro-esophageal reflux disease without esophagitis: Secondary | ICD-10-CM | POA: Insufficient documentation

## 2013-10-15 DIAGNOSIS — M5126 Other intervertebral disc displacement, lumbar region: Secondary | ICD-10-CM | POA: Insufficient documentation

## 2013-10-15 DIAGNOSIS — F3289 Other specified depressive episodes: Secondary | ICD-10-CM | POA: Insufficient documentation

## 2013-10-15 DIAGNOSIS — F17200 Nicotine dependence, unspecified, uncomplicated: Secondary | ICD-10-CM | POA: Insufficient documentation

## 2013-10-15 DIAGNOSIS — F329 Major depressive disorder, single episode, unspecified: Secondary | ICD-10-CM | POA: Insufficient documentation

## 2013-10-15 DIAGNOSIS — M5136 Other intervertebral disc degeneration, lumbar region: Secondary | ICD-10-CM | POA: Diagnosis present

## 2013-10-15 DIAGNOSIS — Z6833 Body mass index (BMI) 33.0-33.9, adult: Secondary | ICD-10-CM | POA: Insufficient documentation

## 2013-10-15 DIAGNOSIS — J4489 Other specified chronic obstructive pulmonary disease: Secondary | ICD-10-CM | POA: Insufficient documentation

## 2013-10-15 DIAGNOSIS — G8929 Other chronic pain: Secondary | ICD-10-CM | POA: Insufficient documentation

## 2013-10-15 DIAGNOSIS — E669 Obesity, unspecified: Secondary | ICD-10-CM | POA: Insufficient documentation

## 2013-10-15 DIAGNOSIS — Z981 Arthrodesis status: Secondary | ICD-10-CM | POA: Insufficient documentation

## 2013-10-15 DIAGNOSIS — I10 Essential (primary) hypertension: Secondary | ICD-10-CM | POA: Insufficient documentation

## 2013-10-15 DIAGNOSIS — G4730 Sleep apnea, unspecified: Secondary | ICD-10-CM | POA: Insufficient documentation

## 2013-10-15 DIAGNOSIS — F411 Generalized anxiety disorder: Secondary | ICD-10-CM | POA: Insufficient documentation

## 2013-10-15 HISTORY — PX: LUMBAR SPINE SURGERY: SHX701

## 2013-10-15 HISTORY — PX: LUMBAR LAMINECTOMY/DECOMPRESSION MICRODISCECTOMY: SHX5026

## 2013-10-15 SURGERY — LUMBAR LAMINECTOMY/DECOMPRESSION MICRODISCECTOMY
Anesthesia: General | Site: Back | Laterality: Left

## 2013-10-15 MED ORDER — HYDROMORPHONE HCL PF 1 MG/ML IJ SOLN
INTRAMUSCULAR | Status: AC
  Filled 2013-10-15: qty 1

## 2013-10-15 MED ORDER — LIDOCAINE HCL (CARDIAC) 20 MG/ML IV SOLN
INTRAVENOUS | Status: DC | PRN
  Administered 2013-10-15: 100 mg via INTRAVENOUS

## 2013-10-15 MED ORDER — DEXAMETHASONE SODIUM PHOSPHATE 4 MG/ML IJ SOLN: 4.0000 mg | Freq: Once | INTRAMUSCULAR | Status: DC

## 2013-10-15 MED ORDER — DULOXETINE HCL 30 MG PO CPEP
30.0000 mg | ORAL_CAPSULE | Freq: Every day | ORAL | Status: DC
  Administered 2013-10-16: 30 mg via ORAL
  Filled 2013-10-15: qty 1

## 2013-10-15 MED ORDER — LACTATED RINGERS IV SOLN
INTRAVENOUS | Status: DC | PRN
  Administered 2013-10-15 (×2): via INTRAVENOUS

## 2013-10-15 MED ORDER — OXYCODONE HCL 5 MG PO TABS
10.0000 mg | ORAL_TABLET | ORAL | Status: DC | PRN
  Administered 2013-10-15 – 2013-10-16 (×6): 10 mg via ORAL
  Filled 2013-10-15 (×6): qty 2

## 2013-10-15 MED ORDER — ZOLPIDEM TARTRATE 5 MG PO TABS: 5.0000 mg | ORAL_TABLET | Freq: Every evening | ORAL | Status: DC | PRN

## 2013-10-15 MED ORDER — SODIUM CHLORIDE 0.9 % IJ SOLN: 3.0000 mL | Freq: Two times a day (BID) | INTRAMUSCULAR | Status: DC

## 2013-10-15 MED ORDER — LACTATED RINGERS IV SOLN
INTRAVENOUS | Status: DC
Start: 2013-10-15 — End: 2013-10-16
  Administered 2013-10-15 (×2): via INTRAVENOUS

## 2013-10-15 MED ORDER — OLANZAPINE 10 MG PO TABS
10.0000 mg | ORAL_TABLET | Freq: Every day | ORAL | Status: DC
  Administered 2013-10-15: 10 mg via ORAL
  Filled 2013-10-15 (×2): qty 1

## 2013-10-15 MED ORDER — PROPOFOL 10 MG/ML IV BOLUS
INTRAVENOUS | Status: DC | PRN
  Administered 2013-10-15: 200 mg via INTRAVENOUS

## 2013-10-15 MED ORDER — HEMOSTATIC AGENTS (NO CHARGE) OPTIME
TOPICAL | Status: DC | PRN
  Administered 2013-10-15: 1 via TOPICAL

## 2013-10-15 MED ORDER — MIDAZOLAM HCL 5 MG/5ML IJ SOLN
INTRAMUSCULAR | Status: DC | PRN
  Administered 2013-10-15: 2 mg via INTRAVENOUS

## 2013-10-15 MED ORDER — DIAZEPAM 5 MG/ML IJ SOLN
INTRAMUSCULAR | Status: AC
  Filled 2013-10-15: qty 2

## 2013-10-15 MED ORDER — LISINOPRIL 10 MG PO TABS
10.0000 mg | ORAL_TABLET | Freq: Every day | ORAL | Status: DC
  Administered 2013-10-15 – 2013-10-16 (×2): 10 mg via ORAL
  Filled 2013-10-15 (×2): qty 1

## 2013-10-15 MED ORDER — SODIUM CHLORIDE 0.9 % IJ SOLN: 3.0000 mL | INTRAMUSCULAR | Status: DC | PRN

## 2013-10-15 MED ORDER — FENTANYL CITRATE 0.05 MG/ML IJ SOLN
INTRAMUSCULAR | Status: DC | PRN
  Administered 2013-10-15: 50 ug via INTRAVENOUS
  Administered 2013-10-15: 100 ug via INTRAVENOUS
  Administered 2013-10-15 (×3): 50 ug via INTRAVENOUS

## 2013-10-15 MED ORDER — HYDROCHLOROTHIAZIDE 12.5 MG PO CAPS
12.5000 mg | ORAL_CAPSULE | Freq: Every day | ORAL | Status: DC
  Administered 2013-10-15 – 2013-10-16 (×2): 12.5 mg via ORAL
  Filled 2013-10-15 (×2): qty 1

## 2013-10-15 MED ORDER — MORPHINE SULFATE 2 MG/ML IJ SOLN
1.0000 mg | INTRAMUSCULAR | Status: DC | PRN
  Administered 2013-10-15: 4 mg via INTRAVENOUS
  Administered 2013-10-15 – 2013-10-16 (×5): 2 mg via INTRAVENOUS
  Administered 2013-10-16: 4 mg via INTRAVENOUS
  Filled 2013-10-15: qty 2
  Filled 2013-10-15 (×5): qty 1
  Filled 2013-10-15: qty 2

## 2013-10-15 MED ORDER — HYDROMORPHONE HCL PF 1 MG/ML IJ SOLN
0.2500 mg | INTRAMUSCULAR | Status: DC | PRN
  Administered 2013-10-15 (×4): 0.5 mg via INTRAVENOUS

## 2013-10-15 MED ORDER — OXYCODONE HCL 5 MG PO TABS: 5.0000 mg | ORAL_TABLET | Freq: Once | ORAL | Status: DC | PRN

## 2013-10-15 MED ORDER — ACETAMINOPHEN 10 MG/ML IV SOLN
1000.0000 mg | Freq: Four times a day (QID) | INTRAVENOUS | Status: AC
  Administered 2013-10-15 – 2013-10-16 (×4): 1000 mg via INTRAVENOUS
  Filled 2013-10-15 (×4): qty 100

## 2013-10-15 MED ORDER — DEXAMETHASONE SODIUM PHOSPHATE 4 MG/ML IJ SOLN
4.0000 mg | Freq: Four times a day (QID) | INTRAMUSCULAR | Status: DC
  Administered 2013-10-15: 4 mg via INTRAVENOUS
  Filled 2013-10-15 (×7): qty 1

## 2013-10-15 MED ORDER — SODIUM CHLORIDE 0.9 % IV SOLN: 250.0000 mL | INTRAVENOUS | Status: DC

## 2013-10-15 MED ORDER — ONDANSETRON HCL 4 MG/2ML IJ SOLN: 4.0000 mg | INTRAMUSCULAR | Status: DC | PRN

## 2013-10-15 MED ORDER — FENTANYL CITRATE 0.05 MG/ML IJ SOLN: 50.0000 ug | Freq: Once | INTRAMUSCULAR | Status: DC

## 2013-10-15 MED ORDER — THROMBIN 20000 UNITS EX SOLR
OROMUCOSAL | Status: DC | PRN
  Administered 2013-10-15: 09:00:00 via TOPICAL

## 2013-10-15 MED ORDER — ROCURONIUM BROMIDE 100 MG/10ML IV SOLN
INTRAVENOUS | Status: DC | PRN
  Administered 2013-10-15: 10 mg via INTRAVENOUS
  Administered 2013-10-15: 20 mg via INTRAVENOUS
  Administered 2013-10-15: 50 mg via INTRAVENOUS
  Administered 2013-10-15: 10 mg via INTRAVENOUS

## 2013-10-15 MED ORDER — DEXAMETHASONE 4 MG PO TABS
4.0000 mg | ORAL_TABLET | Freq: Four times a day (QID) | ORAL | Status: DC
  Administered 2013-10-15 – 2013-10-16 (×3): 4 mg via ORAL
  Filled 2013-10-15 (×7): qty 1

## 2013-10-15 MED ORDER — BUPIVACAINE-EPINEPHRINE (PF) 0.25% -1:200000 IJ SOLN
INTRAMUSCULAR | Status: AC
  Filled 2013-10-15: qty 30

## 2013-10-15 MED ORDER — ACETAMINOPHEN 10 MG/ML IV SOLN
1000.0000 mg | Freq: Four times a day (QID) | INTRAVENOUS | Status: DC
  Administered 2013-10-15: 1000 mg via INTRAVENOUS
  Filled 2013-10-15: qty 100

## 2013-10-15 MED ORDER — THROMBIN 20000 UNITS EX SOLR
CUTANEOUS | Status: AC
  Filled 2013-10-15: qty 20000

## 2013-10-15 MED ORDER — METHOCARBAMOL 100 MG/ML IJ SOLN
500.0000 mg | Freq: Four times a day (QID) | INTRAVENOUS | Status: DC | PRN
  Filled 2013-10-15: qty 5

## 2013-10-15 MED ORDER — MENTHOL 3 MG MT LOZG: 1.0000 | LOZENGE | OROMUCOSAL | Status: DC | PRN

## 2013-10-15 MED ORDER — PROMETHAZINE HCL 25 MG/ML IJ SOLN: 6.2500 mg | INTRAMUSCULAR | Status: DC | PRN

## 2013-10-15 MED ORDER — 0.9 % SODIUM CHLORIDE (POUR BTL) OPTIME
TOPICAL | Status: DC | PRN
  Administered 2013-10-15: 1000 mL

## 2013-10-15 MED ORDER — DEXAMETHASONE SODIUM PHOSPHATE 4 MG/ML IJ SOLN
INTRAMUSCULAR | Status: AC
  Filled 2013-10-15: qty 1

## 2013-10-15 MED ORDER — BUPIVACAINE-EPINEPHRINE 0.25% -1:200000 IJ SOLN
INTRAMUSCULAR | Status: DC | PRN
  Administered 2013-10-15: 10 mL

## 2013-10-15 MED ORDER — METHOCARBAMOL 500 MG PO TABS
500.0000 mg | ORAL_TABLET | Freq: Four times a day (QID) | ORAL | Status: DC | PRN
  Administered 2013-10-15 – 2013-10-16 (×2): 500 mg via ORAL
  Filled 2013-10-15 (×2): qty 1

## 2013-10-15 MED ORDER — OXYCODONE HCL 5 MG/5ML PO SOLN: 5.0000 mg | Freq: Once | ORAL | Status: DC | PRN

## 2013-10-15 MED ORDER — DIAZEPAM 5 MG/ML IJ SOLN
5.0000 mg | Freq: Once | INTRAMUSCULAR | Status: AC
  Administered 2013-10-15: 3 mg via INTRAVENOUS
  Administered 2013-10-15: 2 mg via INTRAVENOUS

## 2013-10-15 MED ORDER — GLYCOPYRROLATE 0.2 MG/ML IJ SOLN
INTRAMUSCULAR | Status: DC | PRN
  Administered 2013-10-15: 0.6 mg via INTRAVENOUS

## 2013-10-15 MED ORDER — CEFAZOLIN SODIUM 1-5 GM-% IV SOLN
1.0000 g | Freq: Three times a day (TID) | INTRAVENOUS | Status: AC
  Administered 2013-10-15 (×2): 1 g via INTRAVENOUS
  Filled 2013-10-15 (×2): qty 50

## 2013-10-15 MED ORDER — GABAPENTIN 300 MG PO CAPS
300.0000 mg | ORAL_CAPSULE | Freq: Three times a day (TID) | ORAL | Status: DC | PRN
  Filled 2013-10-15: qty 1

## 2013-10-15 MED ORDER — MIDAZOLAM HCL 2 MG/2ML IJ SOLN: 1.0000 mg | INTRAMUSCULAR | Status: DC | PRN

## 2013-10-15 MED ORDER — PHENYLEPHRINE HCL 10 MG/ML IJ SOLN
INTRAMUSCULAR | Status: DC | PRN
  Administered 2013-10-15 (×2): 80 ug via INTRAVENOUS

## 2013-10-15 MED ORDER — PHENOL 1.4 % MT LIQD: 1.0000 | OROMUCOSAL | Status: DC | PRN

## 2013-10-15 MED ORDER — AMITRIPTYLINE HCL 75 MG PO TABS
150.0000 mg | ORAL_TABLET | Freq: Every day | ORAL | Status: DC
  Administered 2013-10-15: 150 mg via ORAL
  Filled 2013-10-15 (×3): qty 2

## 2013-10-15 MED ORDER — METOPROLOL SUCCINATE ER 100 MG PO TB24
100.0000 mg | ORAL_TABLET | Freq: Every day | ORAL | Status: DC
  Administered 2013-10-16: 100 mg via ORAL
  Filled 2013-10-15: qty 1

## 2013-10-15 MED ORDER — ONDANSETRON HCL 4 MG/2ML IJ SOLN
INTRAMUSCULAR | Status: DC | PRN
  Administered 2013-10-15: 4 mg via INTRAVENOUS

## 2013-10-15 MED ORDER — NEOSTIGMINE METHYLSULFATE 1 MG/ML IJ SOLN
INTRAMUSCULAR | Status: DC | PRN
  Administered 2013-10-15: 4 mg via INTRAVENOUS

## 2013-10-15 MED ORDER — DEXAMETHASONE SODIUM PHOSPHATE 10 MG/ML IJ SOLN
INTRAMUSCULAR | Status: AC
  Administered 2013-10-15: 10 mg via INTRAVENOUS
  Filled 2013-10-15: qty 1

## 2013-10-15 SURGICAL SUPPLY — 59 items
BUR EGG ELITE 4.0 (BURR) IMPLANT
BUR EGG ELITE 4.0MM (BURR)
BUR MATCHSTICK NEURO 3.0 LAGG (BURR) ×1 IMPLANT
CANISTER SUCTION 2500CC (MISCELLANEOUS) ×3 IMPLANT
CLOSURE STERI-STRIP 1/2X4 (GAUZE/BANDAGES/DRESSINGS) ×1
CLOTH BEACON ORANGE TIMEOUT ST (SAFETY) ×1 IMPLANT
CLSR STERI-STRIP ANTIMIC 1/2X4 (GAUZE/BANDAGES/DRESSINGS) ×2 IMPLANT
CORDS BIPOLAR (ELECTRODE) ×3 IMPLANT
COVER SURGICAL LIGHT HANDLE (MISCELLANEOUS) ×3 IMPLANT
DRAIN CHANNEL 15F RND FF W/TCR (WOUND CARE) ×3 IMPLANT
DRAPE POUCH INSTRU U-SHP 10X18 (DRAPES) ×3 IMPLANT
DRAPE SURG 17X23 STRL (DRAPES) ×3 IMPLANT
DRAPE U-SHAPE 47X51 STRL (DRAPES) ×3 IMPLANT
DRSG MEPILEX BORDER 4X8 (GAUZE/BANDAGES/DRESSINGS) ×3 IMPLANT
DURAPREP 26ML APPLICATOR (WOUND CARE) ×3 IMPLANT
ELECT BLADE 4.0 EZ CLEAN MEGAD (MISCELLANEOUS) ×3
ELECT CAUTERY BLADE 6.4 (BLADE) ×1 IMPLANT
ELECT REM PT RETURN 9FT ADLT (ELECTROSURGICAL) ×3
ELECTRODE BLDE 4.0 EZ CLN MEGD (MISCELLANEOUS) IMPLANT
ELECTRODE REM PT RTRN 9FT ADLT (ELECTROSURGICAL) ×1 IMPLANT
EVACUATOR 1/8 PVC DRAIN (DRAIN) IMPLANT
EVACUATOR SILICONE 100CC (DRAIN) ×3 IMPLANT
GLOVE BIOGEL PI IND STRL 8 (GLOVE) ×1 IMPLANT
GLOVE BIOGEL PI IND STRL 8.5 (GLOVE) ×1 IMPLANT
GLOVE BIOGEL PI INDICATOR 8 (GLOVE) ×2
GLOVE BIOGEL PI INDICATOR 8.5 (GLOVE) ×2
GLOVE ECLIPSE 8.5 STRL (GLOVE) ×3 IMPLANT
GLOVE ORTHO TXT STRL SZ7.5 (GLOVE) ×3 IMPLANT
GOWN PREVENTION PLUS XXLARGE (GOWN DISPOSABLE) ×3 IMPLANT
GOWN STRL REIN 2XL XLG LVL4 (GOWN DISPOSABLE) ×3 IMPLANT
GOWN STRL REIN XL XLG (GOWN DISPOSABLE) ×6 IMPLANT
KIT BASIN OR (CUSTOM PROCEDURE TRAY) ×3 IMPLANT
KIT ROOM TURNOVER OR (KITS) ×3 IMPLANT
NDL SPNL 18GX3.5 QUINCKE PK (NEEDLE) ×2 IMPLANT
NEEDLE 22X1 1/2 (OR ONLY) (NEEDLE) ×3 IMPLANT
NEEDLE SPNL 18GX3.5 QUINCKE PK (NEEDLE) ×6 IMPLANT
NS IRRIG 1000ML POUR BTL (IV SOLUTION) ×3 IMPLANT
PACK LAMINECTOMY ORTHO (CUSTOM PROCEDURE TRAY) ×3 IMPLANT
PACK UNIVERSAL I (CUSTOM PROCEDURE TRAY) ×3 IMPLANT
PAD ARMBOARD 7.5X6 YLW CONV (MISCELLANEOUS) ×6 IMPLANT
PATTIES SURGICAL .5 X.5 (GAUZE/BANDAGES/DRESSINGS) IMPLANT
PATTIES SURGICAL .5 X1 (DISPOSABLE) ×3 IMPLANT
SPONGE SURGIFOAM ABS GEL 100 (HEMOSTASIS) ×3 IMPLANT
SURGIFLO TRUKIT (HEMOSTASIS) ×2 IMPLANT
SUT MON AB 3-0 SH 27 (SUTURE) ×3
SUT MON AB 3-0 SH27 (SUTURE) ×1 IMPLANT
SUT VIC AB 0 CT1 27 (SUTURE) ×3
SUT VIC AB 0 CT1 27XBRD ANBCTR (SUTURE) ×1 IMPLANT
SUT VIC AB 1 CT1 27 (SUTURE) ×3
SUT VIC AB 1 CT1 27XBRD ANBCTR (SUTURE) ×1 IMPLANT
SUT VIC AB 1 CTX 36 (SUTURE) ×6
SUT VIC AB 1 CTX36XBRD ANBCTR (SUTURE) ×2 IMPLANT
SUT VIC AB 2-0 CT1 18 (SUTURE) ×3 IMPLANT
SYR BULB IRRIGATION 50ML (SYRINGE) ×3 IMPLANT
SYR CONTROL 10ML LL (SYRINGE) ×3 IMPLANT
TOWEL OR 17X24 6PK STRL BLUE (TOWEL DISPOSABLE) ×3 IMPLANT
TOWEL OR 17X26 10 PK STRL BLUE (TOWEL DISPOSABLE) ×3 IMPLANT
WATER STERILE IRR 1000ML POUR (IV SOLUTION) ×3 IMPLANT
YANKAUER SUCT BULB TIP NO VENT (SUCTIONS) ×3 IMPLANT

## 2013-10-15 NOTE — Anesthesia Preprocedure Evaluation (Addendum)
Anesthesia Evaluation  Patient identified by MRN, date of birth, ID band Patient awake    Reviewed: Allergy & Precautions, H&P , NPO status , Patient's Chart, lab work & pertinent test results  Airway Mallampati: III TM Distance: >3 FB Neck ROM: Full    Dental  (+) Teeth Intact   Pulmonary COPDCurrent Smoker,  + rhonchi         Cardiovascular hypertension, Rhythm:Regular Rate:Normal     Neuro/Psych Depression    GI/Hepatic GERD-  Medicated,  Endo/Other    Renal/GU      Musculoskeletal   Abdominal (+) + obese,   Peds  Hematology   Anesthesia Other Findings   Reproductive/Obstetrics                          Anesthesia Physical Anesthesia Plan  ASA: II  Anesthesia Plan: General   Post-op Pain Management:    Induction: Intravenous  Airway Management Planned: Oral ETT  Additional Equipment:   Intra-op Plan:   Post-operative Plan: Extubation in OR  Informed Consent: I have reviewed the patients History and Physical, chart, labs and discussed the procedure including the risks, benefits and alternatives for the proposed anesthesia with the patient or authorized representative who has indicated his/her understanding and acceptance.     Plan Discussed with: CRNA and Surgeon  Anesthesia Plan Comments:         Anesthesia Quick Evaluation

## 2013-10-15 NOTE — H&P (Signed)
No change in clinical exam H+P reviewed  

## 2013-10-15 NOTE — Brief Op Note (Signed)
10/15/2013  10:42 AM  PATIENT:  Bryan Huffman  43 y.o. male  PRE-OPERATIVE DIAGNOSIS:  L2-3 DISC HERNIATION  POST-OPERATIVE DIAGNOSIS:  L2-3 disc herniation  PROCEDURE:  Procedure(s): LUMBAR 2-3 LEFT DISCECTOMY (Left)  SURGEON:  Surgeon(s) and Role:    * Venita Lickahari Bridger Pizzi, MD - Primary  PHYSICIAN ASSISTANT:   ASSISTANTS: Zonia KiefJames Owens    ANESTHESIA:   general  EBL:  Total I/O In: 1000 [I.V.:1000] Out: -   BLOOD ADMINISTERED:none  DRAINS: none   LOCAL MEDICATIONS USED:  MARCAINE     SPECIMEN:  No Specimen  DISPOSITION OF SPECIMEN:  N/A  COUNTS:  YES  TOURNIQUET:  * No tourniquets in log *  DICTATION: .Other Dictation: Dictation Number [id number]  PLAN OF CARE: Admit for overnight observation  PATIENT DISPOSITION:  PACU - hemodynamically stable.

## 2013-10-15 NOTE — Anesthesia Procedure Notes (Signed)
Procedure Name: Intubation Date/Time: 10/15/2013 8:29 AM Performed by: Sharlene DoryWALKER, Raziya Aveni E Pre-anesthesia Checklist: Patient identified, Emergency Drugs available, Suction available, Patient being monitored and Timeout performed Patient Re-evaluated:Patient Re-evaluated prior to inductionOxygen Delivery Method: Circle system utilized Preoxygenation: Pre-oxygenation with 100% oxygen Intubation Type: IV induction Ventilation: Mask ventilation without difficulty Laryngoscope Size: Mac and 3 Grade View: Grade II Tube type: Oral Tube size: 7.5 mm Number of attempts: 1 Airway Equipment and Method: Stylet Placement Confirmation: ETT inserted through vocal cords under direct vision,  positive ETCO2 and breath sounds checked- equal and bilateral Secured at: 22 cm Tube secured with: Tape Dental Injury: Teeth and Oropharynx as per pre-operative assessment

## 2013-10-15 NOTE — Anesthesia Postprocedure Evaluation (Signed)
  Anesthesia Post-op Note  Patient: Bryan Huffman  Procedure(s) Performed: Procedure(s): LUMBAR 2-3 LEFT DISCECTOMY (Left)  Patient Location: PACU  Anesthesia Type:General  Level of Consciousness: awake and alert   Airway and Oxygen Therapy: Patient Spontanous Breathing  Post-op Pain: moderate  Post-op Assessment: Post-op Vital signs reviewed, Patient's Cardiovascular Status Stable, Respiratory Function Stable, Patent Airway, No signs of Nausea or vomiting and Pain level controlled  Post-op Vital Signs: Reviewed and stable  Complications: No apparent anesthesia complications

## 2013-10-15 NOTE — Progress Notes (Signed)
Orthopedic Tech Progress Note Patient Details:  Bryan SpiresJody A Huffman [birth year] [id number] Brace completed by bio-tech Patient ID: Bryan Huffman, male   DOB: [birth year], 43 y.o.   MRN: [id number]   Bryan Huffman, Bryan Huffman 10/15/2013, 4:45 PM

## 2013-10-15 NOTE — Transfer of Care (Signed)
Immediate Anesthesia Transfer of Care Note  Patient: Bryan Huffman  Procedure(s) Performed: Procedure(s): LUMBAR 2-3 LEFT DISCECTOMY (Left)  Patient Location: PACU  Anesthesia Type:General  Level of Consciousness: sedated  Airway & Oxygen Therapy: Patient Spontanous Breathing and Patient connected to face mask oxygen  Post-op Assessment: Report given to PACU RN, Post -op Vital signs reviewed and stable and Patient moving all extremities X 4  Post vital signs: Reviewed and stable  Complications: No apparent anesthesia complications

## 2013-10-15 NOTE — Preoperative (Signed)
Beta Blockers   Reason not to administer Beta Blockers:Not Applicable 

## 2013-10-16 ENCOUNTER — Encounter (HOSPITAL_COMMUNITY): Payer: Self-pay | Admitting: General Practice

## 2013-10-16 MED ORDER — ALUM & MAG HYDROXIDE-SIMETH 200-200-20 MG/5ML PO SUSP
30.0000 mL | ORAL | Status: DC | PRN
  Filled 2013-10-16: qty 30

## 2013-10-16 MED ORDER — DOCUSATE SODIUM 100 MG PO CAPS: 100.0000 mg | ORAL_CAPSULE | Freq: Two times a day (BID) | ORAL | Status: DC

## 2013-10-16 MED ORDER — METHOCARBAMOL 500 MG PO TABS: 500.0000 mg | ORAL_TABLET | Freq: Four times a day (QID) | ORAL | Status: DC | PRN

## 2013-10-16 MED ORDER — OXYCODONE-ACETAMINOPHEN 7.5-325 MG PO TABS: 1.0000 | ORAL_TABLET | ORAL | Status: DC | PRN

## 2013-10-16 MED ORDER — ALUM & MAG HYDROXIDE-SIMETH 200-200-20 MG/5ML PO SUSP
30.0000 mL | Freq: Four times a day (QID) | ORAL | Status: DC | PRN
  Administered 2013-10-16 (×2): 30 mL via ORAL

## 2013-10-16 MED ORDER — POLYETHYLENE GLYCOL 3350 17 G PO PACK: 17.0000 g | PACK | Freq: Every day | ORAL | Status: DC

## 2013-10-16 MED ORDER — ONDANSETRON 4 MG PO TBDP: 4.0000 mg | ORAL_TABLET | Freq: Three times a day (TID) | ORAL | Status: DC | PRN

## 2013-10-16 MED ORDER — PANTOPRAZOLE SODIUM 40 MG PO TBEC
40.0000 mg | DELAYED_RELEASE_TABLET | Freq: Every day | ORAL | Status: DC
  Administered 2013-10-16: 40 mg via ORAL
  Filled 2013-10-16: qty 1

## 2013-10-16 NOTE — Discharge Summary (Signed)
  ABBREVIATED DISCHARGE SUMMARY      DATE OF HOSPITALIZATION:  16 Oct 2013  REASON FOR HOSPITALIZATION:  43 yo wm w/ hx of L2-3 HNP, low back pain and left LE radiculopathy.  Failed conservative treatment.      SIGNIFICANT FINDINGS:  L2-3 HNP  OPERATION: L2-3 microdiscectomy  FINAL DIAGNOSIS:  same  SECONDARY DIAGNOSIS: none  CONSULTANTS:  none  DISCHARGE CONDITION:  STABLE  DISCHARGED TO:  HOME

## 2013-10-16 NOTE — Progress Notes (Signed)
Subjective: Back doing very well.  No more left leg pain.  Does c/o heartburn.  Wants maalox.     Objective: Vital signs in last 24 hours: Temp:  [97.6 F (36.4 C)-98.5 F (36.9 C)] 98.1 F (36.7 C) (01/08 0553) Pulse Rate:  [72-101] 98 (01/08 0553) Resp:  [13-22] 18 (01/08 0553) BP: (115-140)/(65-99) 139/79 mmHg (01/08 0553) SpO2:  [93 %-100 %] 99 % (01/08 0553)  Intake/Output from previous day: 01/07 0701 - 01/08 0700 In: 2951.3 [P.O.:510; I.V.:2341.3; IV Piggyback:100] Out: 2055 [Urine:2030; Blood:25] Intake/Output this shift: Total I/O In: 120 [P.O.:120] Out: -   No results found for this basename: HGB,  in the last 72 hours No results found for this basename: WBC, RBC, HCT, PLT,  in the last 72 hours No results found for this basename: NA, K, CL, CO2, BUN, CREATININE, GLUCOSE, CALCIUM,  in the last 72 hours No results found for this basename: LABPT, INR,  in the last 72 hours  Exam:  Dressing c/d/i.  Calves nt, nvi.    Assessment/Plan: Give maalox now for GERD, and also ordered protonix.  D/c home today after ambulates with PT.  F/u 2 weeks postop.  All questions answered.     Tino Ronan M 10/16/2013, 9:19 AM

## 2013-10-16 NOTE — Progress Notes (Signed)
Patient continues to void with out difficulty.

## 2013-10-16 NOTE — Progress Notes (Signed)
Patient reported feeling like he was not emptying bladder completely after voiding.  He has been up to bathroom multiple times since surgery.  Post void residual noted on bladder scan to be 680cc.  Patient c/o bladder pressure and discomfort.  Rectal tone and sensation intact.  On-call notified and order received for in and out cath.    Upon arrival to patient's room to perform I&O cath, patient in bathroom and voided 600cc clear, yellow urine.  He reports relief of bladder discomfort.   Will hold on catheterizing at this time and continue to monitor for urinary retention.  Patient was also having significant low back pain when sitting and ambulating earlier, but reports pain was improved during the last ambulation.  Denies leg pain, numbness or tingling.  Refusing to wear lumbar corsett brace during trips to the bathroom.  Back precautions reinforced.   PRN and scheduled pain medications given and effective.  Ice pack applied to low back.

## 2013-10-16 NOTE — Progress Notes (Signed)
Patient spontaneously voided 750cc clear, yellow urine without difficulty, reports relief and no discomfort or distention noted.

## 2013-10-16 NOTE — Op Note (Signed)
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  PROCEDURE:  L2 laminotomy for diskectomy.  COMPLICATIONS:  None.  CONDITION:  Stable.  HISTORY:  This is a very pleasant gentleman who has been having an acute severe back, buttock, and left leg pain.  He has had a previous L3 to L5 instrumented fusion, and has always had longstanding back pain. However, recently he started having horrific radicular left leg pain. Repeat MRI demonstrated an acute disk herniation at the L2-L3 level.  As a result of the failure of improvement with injection therapy, we elected to proceed with surgery.  All appropriate risks, benefits, and alternatives were discussed with the patient and consent was obtained.  OPERATIVE NOTE:  The patient was brought to the operating room, placed supine on the operating table.  After successful induction of general anesthesia and endotracheal intubation, TED and SCDs were applied.  The patient was turned prone onto the Wilson frame.  All bony prominences were well padded.  The back was prepped and draped in a standard fashion.  Time-out was taken confirming patient procedure and all other pertinent important data.  It should be noted that my assistant was Zonia Kief, my PA.  He was instrumental in assisting with neural retraction, visualization, and suction.  After time-out, we then took an x-ray with 2 needles into the back to confirm incision site.  Once this was done, the incision site was infiltrated with 0.25% Marcaine with epinephrine and a midline  incision was made centered over the L2-3 level.  Sharp dissection was carried out down to the deep fascia.  Deep fascia was sharply incised and then exposed the L2 spinous process.  I then exposed the superior L3 pedicle screw.  Once I had done this, I then placed a Penfield 4 underneath the L2 lamina.  I took a 2nd x-ray to confirm that this was in fact the proper position.  Once this was done, I then placed a Taylor retractor on the lateral side of the pedicle screw construct and then used a curette to release the remaining soft tissue from the L2 lamina.  I then used a 2 and 3 mm Kerrison to perform a generous laminotomy of L2.  I then identified the ligamentum flavum and used a Penfield 4 to develop a plane between the epidural fat and the ligamentum flavum.  I then used 2 and 3 mm Kerrison to remove the ligamentum flavum and exposed the underlying thecal sac.  I then worked my way into the lateral recess using the [place] 4 to dissect and then a 3 and 2 mm Kerrison to remove the ligamentum flavum and osteophyte.  At this point, once I was into the lateral recess, I could identify the L3 nerve root and the  L3 pedicle.  I then swept the thecal sac gently to the medial side and then identified the disk space.  I incised the anulus with a 15 blade scalpel and used the nerve root retractor to protect the thecal sac.  Once this was done, I was able to sweep underneath the annulus and removed several large fragments of disk material at the level of the disk itself.  I was able to get quite medial with the longer nerve hook.  Again this was all subannular.  I then used Epstein curette to remove the osteophytes that were formed as well.  I then went superiorly as based on the MRI there was some superior migration.  This was on the left-hand side. Inferiorly I began tracing myself down until I was just at the inferior aspect of the L3 pedicle.  There were other large fragments of  disk material in the lateral recess at this level that were removed.  At this point, I was quite pleased with the diskectomy.  There were several large fragments of disk that were removed, and I had adequate exposure based on the preoperative MRI to remove all the fragments of disk material.  At this point, using a [facility] elevator, I was able to freely trace underneath the L3 nerve root into the foramen medially underneath the thecal sac superiorly in the lateral recess and along the lateral recess at the disk space level confirming that there was no significant compression.  At this point, I was quite pleased with the decompression. I irrigated copiously with normal saline, then I obtained hemostasis using bipolar electrocautery.  I then placed a thrombin-soaked Gelfoam patty over the exposed thecal sac and then closed the deep fascia with interrupted #1 Vicryl sutures, superficial 2-0 Vicryl sutures, and 3-0 Monocryl for the skin.  Steri-Strips and dry dressing were applied.  The patient was ultimately extubated and transferred to PACU without incident.  At the end of the case, all needle and sponge counts were correct.  There was no adverse intraoperative events.     Alvy Bealahari D Wally Behan, MD     DDB/MEDQ  D:  10/15/2013  T:  10/15/2013  Job:  [id number]

## 2013-10-16 NOTE — Evaluation (Signed)
Physical Therapy Evaluation Patient Details Name: Bryan SpiresJody A Huffman MRN: [id number] DOB: 1971-03-18 Today's Date: 10/16/2013 Time: 4098-11911036-1057 PT Time Calculation (min): 21 min  PT Assessment / Plan / Recommendation History of Present Illness  Pt is a 43 y/o male admitted s/p L2-3 laminectomy/decompression and microdiscectomy.  Clinical Impression  This patient presents with acute pain and decreased functional independence following the above mentioned procedure. At the time of PT eval, pt was able to perform functional mobility with supervision or modified independence. IV pole was used for support during ambulation, however feel confident that pt would be able to ambulate with modified independence (decreased gait speed). This patient is appropriate for skilled PT interventions to address functional limitations, improve safety and independence with functional mobility, and return to PLOF.     PT Assessment  Patient needs continued PT services    Follow Up Recommendations  Outpatient PT (When appropriate according to post-op protocol)    Does the patient have the potential to tolerate intense rehabilitation      Barriers to Discharge        Equipment Recommendations  3in1 (PT) (Pt reports he does not need one)    Recommendations for Other Services     Frequency Min 5X/week    Precautions / Restrictions Precautions Precautions: Fall;Back Precaution Booklet Issued: Yes (comment) Required Braces or Orthoses: Spinal Brace Spinal Brace: Applied in sitting position Restrictions Weight Bearing Restrictions: No   Pertinent Vitals/Pain Pt reports his pain has been well controlled and does not rate on 0-10 scale.       Mobility  Bed Mobility Overal bed mobility: Modified Independent General bed mobility comments: Pt came to sitting on EOB without assist or use of bed rails. VC's after for log roll and to maintain back precautions.  Transfers Overall transfer level: Needs  assistance Equipment used: None Transfers: Sit to/from Stand Sit to Stand: Supervision General transfer comment: VC's to maintain back precautions Ambulation/Gait Ambulation/Gait assistance: Supervision Ambulation Distance (Feet): 125 Feet Assistive device: None (IV pole for support) Gait Pattern/deviations: Step-through pattern;Decreased stride length Gait velocity: Decreased Gait velocity interpretation: Below normal speed for age/gender General Gait Details: VC's to hold IV pole to the side with 1 hand to maintain back precautions.  Stairs:  (Pt declined practicing stairs)    Exercises     PT Diagnosis: Difficulty walking;Acute pain  PT Problem List: Decreased strength;Decreased range of motion;Decreased activity tolerance;Decreased balance;Decreased mobility;Decreased knowledge of use of DME;Decreased safety awareness;Decreased knowledge of precautions;Pain PT Treatment Interventions: DME instruction;Gait training;Stair training;Functional mobility training;Therapeutic activities;Therapeutic exercise;Neuromuscular re-education;Patient/family education     PT Goals(Current goals can be found in the care plan section) Acute Rehab PT Goals Patient Stated Goal: Home today PT Goal Formulation: With patient Time For Goal Achievement: 10/30/13 Potential to Achieve Goals: Good  Visit Information  Last PT Received On: 10/16/13 Assistance Needed: +1 History of Present Illness: Pt is a 43 y/o male admitted s/p L2-3 laminectomy/decompression and microdiscectomy.       Prior Functioning  Home Living Family/patient expects to be discharged to:: Private residence Living Arrangements: Parent Available Help at Discharge: Family;Available 24 hours/day Type of Home: House Home Access: Stairs to enter Entergy CorporationEntrance Stairs-Number of Steps: 3 Entrance Stairs-Rails: Left (Cabinet to hold on to) Home Layout: One level Home Equipment: Bryan HumphreysWalker - 2 wheels;Crutches Prior Function Level of  Independence: Independent with assistive device(s);Needs assistance ADL's / Homemaking Assistance Needed: Occasionally needs assist with shoes and socks Communication Communication: No difficulties Dominant Hand: Right  Cognition  Cognition Arousal/Alertness: Awake/alert Behavior During Therapy: WFL for tasks assessed/performed Overall Cognitive Status: Within Functional Limits for tasks assessed    Extremity/Trunk Assessment Upper Extremity Assessment Upper Extremity Assessment: Overall WFL for tasks assessed Lower Extremity Assessment Lower Extremity Assessment: Overall WFL for tasks assessed Cervical / Trunk Assessment Cervical / Trunk Assessment: Normal   Balance Balance Overall balance assessment: No apparent balance deficits (not formally assessed)  End of Session PT - End of Session Equipment Utilized During Treatment: Back brace Activity Tolerance: Patient tolerated treatment well Patient left: in chair;with call bell/phone within reach Nurse Communication: Mobility status  GP Functional Assessment Tool Used: Clinical judgement Functional Limitation: Mobility: Walking and moving around Mobility: Walking and Moving Around Current Status (L8756): At least 20 percent but less than 40 percent impaired, limited or restricted Mobility: Walking and Moving Around Goal Status [phone]): At least 20 percent but less than 40 percent impaired, limited or restricted   Bryan Huffman 10/16/2013, 12:03 PM  Bryan Huffman, PT, DPT [phone]

## 2013-10-16 NOTE — Care Management Note (Signed)
CARE MANAGEMENT NOTE 10/16/2013  Patient:  Delia HeadyMCMASTERS,Zell A   Account Number:  [id number]  Date Initiated:  10/16/2013  Documentation initiated by:  Vance PeperBRADY,Lopez Dentinger  Subjective/Objective Assessment:   43 yr old male s/p L2-3 Microdiskectomy     Action/Plan:   No HH needs identified.   Anticipated DC Date:  10/16/2013   Anticipated DC Plan:  HOME/SELF CARE      DC Planning Services  CM consult      Choice offered to / List presented to:             Status of service:  Completed, signed off Medicare Important Message given?   (If response is "NO", the following Medicare IM given date fields will be blank) Date Medicare IM given:   Date Additional Medicare IM given:    Discharge Disposition:  HOME/SELF CARE  Per UR Regulation:

## 2013-10-16 NOTE — Evaluation (Signed)
Occupational Therapy Evaluation and Discharge Patient Details Name: Bryan SpiresJody A Kamara MRN: [id number] DOB: 19-Jun-1971 Today's Date: 10/16/2013 Time: [id number] OT Time Calculation (min): 10 min  OT Assessment / Plan / Recommendation History of present illness L2 laminotomy for diskectomy   Clinical Impression   This 43 yo admitted with above presents to acute OT with all education completed. Pt aware of his back precautions. Acute OT will sign off.    OT Assessment  Patient does not need any further OT services    Follow Up Recommendations  No OT follow up       Equipment Recommendations  None recommended by OT          Precautions / Restrictions Precautions Precautions: Back Required Braces or Orthoses: Spinal Brace Spinal Brace: Applied in sitting position Restrictions Weight Bearing Restrictions: No   Pertinent Vitals/Pain Nurse in to give pain meds while I was in room    ADL  Equipment Used: Reacher;Sock aid ADL Comments: Pt does not feel he will have any issues with toliet or shower transfers. He says somone (parents) that will A with LBBD. I educated him on there being AE that would A him with LBB/D and he said that he was interested. I demonstrated and talked him through how to use the sock aid and reacher for LBD and he returned demonstrated donning of sock. Issued pt a reacher, sock aid, and long handled sponge       Acute Rehab OT Goals Patient Stated Goal: Home today  Visit Information  Last OT Received On: 10/16/13 Assistance Needed: +1 History of Present Illness: L2 laminotomy for diskectomy       Prior Functioning     Home Living Family/patient expects to be discharged to:: Private residence Living Arrangements: Parent Available Help at Discharge: Family;Available 24 hours/day Type of Home: House Home Access: Stairs to enter [employer] CorporationEntrance Stairs-Number of Steps: 3 Entrance Stairs-Rails: Left Home Layout: One level Home Equipment: Walker - 2  wheels;Crutches Prior Function Level of Independence: Independent with assistive device(s);Needs assistance ADL's / Homemaking Assistance Needed: Occasionally needs assist with shoes and socks Communication Communication: No difficulties Dominant Hand: Right         Vision/Perception Vision - History Patient Visual Report: No change from baseline   Cognition  Cognition Arousal/Alertness: Awake/alert Behavior During Therapy: WFL for tasks assessed/performed Overall Cognitive Status: Within Functional Limits for tasks assessed    Extremity/Trunk Assessment Upper Extremity Assessment Upper Extremity Assessment: Overall WFL for tasks assessed              End of Session OT - End of Session Equipment Utilized During Treatment:  (AE) Activity Tolerance: Patient tolerated treatment well Patient left: in chair;with call bell/phone within reach  GO Functional Assessment Tool Used: Clincal observation Functional Limitation: Self care Self Care Current Status (O1308(G8987): At least 1 percent but less than 20 percent impaired, limited or restricted Self Care Goal Status (M5784(G8988): At least 1 percent but less than 20 percent impaired, limited or restricted Self Care Discharge Status [phone](G8989): At least 1 percent but less than 20 percent impaired, limited or restricted   Evette GeorgesLeonard, Rayona Sardinha Eva [phone] 10/16/2013, 11:55 AM

## 2013-10-17 NOTE — Discharge Summary (Signed)
agree with above

## 2013-10-20 ENCOUNTER — Encounter (HOSPITAL_COMMUNITY): Payer: Self-pay | Admitting: Orthopedic Surgery

## 2013-11-15 ENCOUNTER — Emergency Department (HOSPITAL_COMMUNITY)
Admission: EM | Admit: 2013-11-15 | Discharge: 2013-11-16 | Disposition: A | Discharge status: Other Acute Inpatient Hospital | Payer: Medicaid Other | Attending: Psychiatry | Admitting: Psychiatry

## 2013-11-15 ENCOUNTER — Inpatient Hospital Stay (HOSPITAL_COMMUNITY): Admission: AD | Admit: 2013-11-15 | Payer: Self-pay | Source: Intra-hospital | Admitting: Psychiatry

## 2013-11-15 DIAGNOSIS — F1210 Cannabis abuse, uncomplicated: Secondary | ICD-10-CM | POA: Insufficient documentation

## 2013-11-15 DIAGNOSIS — F17200 Nicotine dependence, unspecified, uncomplicated: Secondary | ICD-10-CM | POA: Insufficient documentation

## 2013-11-15 DIAGNOSIS — I10 Essential (primary) hypertension: Secondary | ICD-10-CM | POA: Insufficient documentation

## 2013-11-15 DIAGNOSIS — F329 Major depressive disorder, single episode, unspecified: Secondary | ICD-10-CM | POA: Insufficient documentation

## 2013-11-15 DIAGNOSIS — Z79899 Other long term (current) drug therapy: Secondary | ICD-10-CM | POA: Insufficient documentation

## 2013-11-15 DIAGNOSIS — F1310 Sedative, hypnotic or anxiolytic abuse, uncomplicated: Secondary | ICD-10-CM | POA: Insufficient documentation

## 2013-11-15 DIAGNOSIS — F1110 Opioid abuse, uncomplicated: Secondary | ICD-10-CM | POA: Insufficient documentation

## 2013-11-15 DIAGNOSIS — F3289 Other specified depressive episodes: Secondary | ICD-10-CM | POA: Insufficient documentation

## 2013-11-15 DIAGNOSIS — R45851 Suicidal ideations: Secondary | ICD-10-CM | POA: Insufficient documentation

## 2013-11-15 DIAGNOSIS — K219 Gastro-esophageal reflux disease without esophagitis: Secondary | ICD-10-CM | POA: Insufficient documentation

## 2013-11-15 LAB — CBC WITH DIFFERENTIAL/PLATELET
Basophils Absolute: 0 10*3/uL (ref 0.0–0.1)
Basophils Relative: 1 % (ref 0–1)
Eosinophils Absolute: 0.1 10*3/uL (ref 0.0–0.7)
Eosinophils Relative: 1 % (ref 0–5)
HCT: 37.5 % — ABNORMAL LOW (ref 39.0–52.0)
Hemoglobin: 12.4 g/dL — ABNORMAL LOW (ref 13.0–17.0)
LYMPHS ABS: 1.8 10*3/uL (ref 0.7–4.0)
Lymphocytes Relative: 29 % (ref 12–46)
MCH: 29.6 pg (ref 26.0–34.0)
MCHC: 33.1 g/dL (ref 30.0–36.0)
MCV: 89.5 fL (ref 78.0–100.0)
Monocytes Absolute: 0.5 10*3/uL (ref 0.1–1.0)
Monocytes Relative: 8 % (ref 3–12)
NEUTROS ABS: 3.8 10*3/uL (ref 1.7–7.7)
Neutrophils Relative %: 61 % (ref 43–77)
Platelets: 354 10*3/uL (ref 150–400)
RBC: 4.19 MIL/uL — ABNORMAL LOW (ref 4.22–5.81)
RDW: 13.9 % (ref 11.5–15.5)
WBC: 6.2 10*3/uL (ref 4.0–10.5)

## 2013-11-15 LAB — COMPREHENSIVE METABOLIC PANEL
ALK PHOS: 73 U/L (ref 39–117)
ALT: 28 U/L (ref 0–53)
AST: 41 U/L — AB (ref 0–37)
Albumin: 3.8 g/dL (ref 3.5–5.2)
BUN: 6 mg/dL (ref 6–23)
CO2: 24 mEq/L (ref 19–32)
Calcium: 9.6 mg/dL (ref 8.4–10.5)
Chloride: 96 mEq/L (ref 96–112)
Creatinine, Ser: 0.78 mg/dL (ref 0.50–1.35)
GLUCOSE: 101 mg/dL — AB (ref 70–99)
POTASSIUM: 4 meq/L (ref 3.7–5.3)
Sodium: 138 mEq/L (ref 137–147)
Total Bilirubin: <0.2 mg/dL — ABNORMAL LOW (ref 0.3–1.2)
Total Protein: 7.6 g/dL (ref 6.0–8.3)

## 2013-11-15 LAB — SALICYLATE LEVEL: Salicylate Lvl: <2 mg/dL — ABNORMAL LOW (ref 2.8–20.0)

## 2013-11-15 LAB — ACETAMINOPHEN LEVEL: Acetaminophen (Tylenol), Serum: <15 ug/mL (ref 10–30)

## 2013-11-15 LAB — ETHANOL: ALCOHOL ETHYL (B): 115 mg/dL — AB (ref 0–11)

## 2013-11-15 MED ORDER — KETOROLAC TROMETHAMINE 60 MG/2ML IM SOLN
60.0000 mg | Freq: Once | INTRAMUSCULAR | Status: AC
  Administered 2013-11-15: 60 mg via INTRAMUSCULAR
  Filled 2013-11-15: qty 2

## 2013-11-15 NOTE — BH Assessment (Signed)
Assessment Note  Bryan SpiresJody A Liller is an 43 y.o. male who was brought into the ED by EMS after attempting to overdose on oxycodone and placing a gun to his head in an attempt to end his life. Pt reported that he has been under a lot of stress with his ex-wife and stated that "I don't want to live life without her and my son. Pt reported that he had a few drinks and has been fussing with his ex-wife today and "everything just came to a head today." Pt also reported that he recently had back surgery and has been taking more pain medication than prescribed for the past week.  Pt is alert and oriented x4. Pt denies any SI/HI or psychosis at present time; however pt attempted to overdose on oxycodone and place a gun to his head earlier today. It has been reported that his brother had to wrestle with him to get the gun away from him. Pt reported that he has been awake for the past 48 hours and his sleep cycle has been on and off for several years now. Pt reported that his appetite is good. Pt also reported a history of substance abuse. 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 Pt reported that he usually takes his medication as prescribed but over the past week he has been taking "a little more oxycodone than prescribed". Pt is divorced and has joint custody of his son. Pt is currently unemployed due to the multiple back surgeries. Pt stated that he lives at home with his parents and they have been very supportive of him. Pt denies any current legal involvement. Pt reported that he has been hospitalized in the past for an attempted overdose. Pt reported that he attempted to overdose on "pain medications, blood pressure and sleeping pills". Pt shared that his suicidal attempts stem from issues with his ex-wife.   Axis I: Major Depression, Recurrent severe Axis II: Deferred Axis III:  Past Medical History  Diagnosis Date  .  Hypertension   . Back pain   . Insomnia   . Mental disorder   . Depression   . GERD (gastroesophageal reflux disease)   . Hemorrhoid   . Arthritis    Axis IV: problems with primary support group Axis V: 1-10 persistent dangerousness to self and others present  Past Medical History:  Past Medical History  Diagnosis Date  . Hypertension   . Back pain   . Insomnia   . Mental disorder   . Depression   . GERD (gastroesophageal reflux disease)   . Hemorrhoid   . Arthritis     Past Surgical History  Procedure Laterality Date  . Tonsillectomy    . Back surgery  1998,2008    L3 L4 L5 fusion  . Lumbar spine surgery  10/15/2013    L 2 L3  DISECTOMY  . Lumbar laminectomy/decompression microdiscectomy Left 10/15/2013    Procedure: LUMBAR 2-3 LEFT DISCECTOMY;  Surgeon: Venita Lickahari Brooks, MD;  Location: [facility] OR;  Service: Orthopedics;  Laterality: Left;    Family History: No family history on file.  Social History:  reports that he has been smoking Cigarettes.  He has a 9 pack-year smoking history. He has never used smokeless tobacco. He reports that he drinks about 1.2 ounces of alcohol per week. He reports that he uses illicit drugs (Marijuana).  Additional Social History:  Alcohol / Drug Use Pain Medications: pt reported  that he has been taking more oxycodone than prescribed for the past week.  Prescriptions: pt reported that he has been taking more oxycodone than prescribed for the past week.  Over the Counter: denies abuse  History of alcohol / drug use?: Yes Negative Consequences of Use: Personal relationships Substance #1 Name of Substance 1: Alcohol 1 - Age of First Use: 19 1 - Amount (size/oz): unknown  1 - Frequency: 2-3 times weekly  1 - Duration: years  1 - Last Use / Amount: Glass of wine  Substance #2 Name of Substance 2: Marijuanna  2 - Age of First Use: 13 2 - Amount (size/oz): varies 2 - Frequency: varies 2 - Duration: years 2 - Last Use / Amount: "a few puffs".    CIWA: CIWA-Ar BP: 152/89 mmHg Pulse Rate: 100 COWS:    Allergies:  Allergies  Allergen Reactions  . Bee Venom Swelling    Home Medications:  (Not in a hospital admission)  OB/GYN Status:  No LMP for male patient.  General Assessment Data Location of Assessment: [facility] ED Is this a Tele or Face-to-Face Assessment?: Tele Assessment Is this an Initial Assessment or a Re-assessment for this encounter?: Initial Assessment Living Arrangements: Parent Can pt return to current living arrangement?: Yes Admission Status: Involuntary Transfer from: Acute Hospital Referral Source: Self/Family/Friend     [facility] Crisis Care Plan Living Arrangements: Parent  Education Status Is patient currently in school?: No  Risk to self Suicidal Ideation: Yes-Currently Present Suicidal Intent: Yes-Currently Present Is patient at risk for suicide?: Yes Suicidal Plan?: Yes-Currently Present Specify Current Suicidal Plan: Pt attempted to overdose on oxycodone and placed a gun to his head.  Access to Means: Yes Specify Access to Suicidal Means: Pt has access to a gun  What has been your use of drugs/alcohol within the last 12 months?: Pt reported smoking weed and drinking alcohol.  Previous Attempts/Gestures: Yes How many times?: 1 Other Self Harm Risks: no Triggers for Past Attempts: Family contact Intentional Self Injurious Behavior: None Family Suicide History: No Recent stressful life event(s): Conflict (Comment) (Problems with ex-wife. ) Persecutory voices/beliefs?: No Depression: Yes Depression Symptoms: Despondent;Insomnia;Tearfulness;Feeling worthless/self pity;Feeling angry/irritable Substance abuse history and/or treatment for substance abuse?: Yes  Risk to Others Homicidal Ideation: No Thoughts of Harm to Others: No Current Homicidal Intent: No Current Homicidal Plan: No Access to Homicidal Means: No Identified Victim: none History of harm to others?: No Assessment of Violence:  None Noted Violent Behavior Description: none reported.  Does patient have access to weapons?: Yes (Comment) (Pt has access to a gun ) Criminal Charges Pending?: No Does patient have a court date: No  Psychosis Hallucinations: None noted Delusions: None noted  Mental Status Report Appear/Hygiene: Disheveled Eye Contact: Poor Motor Activity: Freedom of movement Speech: Logical/coherent Level of Consciousness: Alert Mood: Depressed Affect: Appropriate to circumstance Anxiety Level: Minimal Thought Processes: Coherent;Relevant Judgement: Impaired Orientation: Person;Place;Time;Situation Obsessive Compulsive Thoughts/Behaviors: None  Cognitive Functioning Concentration: Normal Memory: Recent Intact;Remote Intact IQ: Average Insight: Fair Impulse Control: Fair Appetite: Good Weight Loss: 0 Weight Gain: 0 Sleep: Decreased Total Hours of Sleep: 0 (Pt reported being awake for the past 48 hours. ) Vegetative Symptoms: None  ADLScreening [facility]) Patient's cognitive ability adequate to safely complete daily activities?: Yes Patient able to express need for assistance with ADLs?: Yes Independently performs ADLs?: Yes (appropriate for developmental age)  Prior Inpatient Therapy Prior Inpatient Therapy: Yes Prior Therapy Dates: 2014 Prior Therapy Facilty/Provider(s): [facility] Reason for Treatment: "attempted overdose".  Prior Outpatient Therapy Prior Outpatient Therapy: No  ADL Screening (condition at time of admission) Patient's cognitive ability adequate to safely complete daily activities?: Yes Patient able to express need for assistance with ADLs?: Yes Independently performs ADLs?: Yes (appropriate for developmental age)       Abuse/Neglect Assessment (Assessment to be complete while patient is alone) Physical Abuse: Denies Verbal Abuse: Denies Sexual Abuse: Denies Exploitation of patient/patient's resources: Denies Self-Neglect: Denies           Additional Information 1:1 In Past 12 Months?: No CIRT Risk: No Elopement Risk: No Does patient have medical clearance?: Yes     Disposition: Consulted with Alberteen Sam, NP who agrees that pt meets criteria for inpatient treatment. Alberteen Sam, NP accepted pt to [facility] Room 506 bed 2. Notified Dr. Freida Busman of admission.  Disposition Initial Assessment Completed for this Encounter: Yes Disposition of Patient: Inpatient treatment program Type of inpatient treatment program: Adult  On Site Evaluation by:   Reviewed with Physician:    Lahoma Rocker 11/15/2013 10:50 PM

## 2013-11-15 NOTE — BH Assessment (Signed)
Received call for a tele-assessment. Spoke with Dr. Freida BusmanAllen, who stated that pt is suicidal and put a gun to his head but he did not pull the trigger. He also reported that pt took 10 oxycodone but his levels are fine. Tele-assessment will be initiated.

## 2013-11-15 NOTE — ED Provider Notes (Signed)
CSN: [id number]     Arrival date & time 11/15/13  1936 History   First MD Initiated Contact with Patient 11/15/13 1944     Chief Complaint  Patient presents with  . Drug Overdose  . Suicide Attempt   (Consider location/radiation/quality/duration/timing/severity/associated sxs/prior Treatment) Patient is a 43 y.o. male presenting with Overdose. The history is provided by the patient.  Drug Overdose   patient here complaining of suicidal ideations x3 days due 2 increased depression about his ex-wife and son. Took 10 oxycodone tablets during the daytime as well as he placed a gun to his head. Does have a prior history of suicide attempt. Denies any command hallucinations. No recent fever or chills. He does have a history of depression. Admits to drinking alcohol today as well as using marijuana. EMS was called and patient transported with the [facility]  Past Medical History  Diagnosis Date  . Hypertension   . Back pain   . Insomnia   . Mental disorder   . Depression   . GERD (gastroesophageal reflux disease)   . Hemorrhoid   . Arthritis    Past Surgical History  Procedure Laterality Date  . Tonsillectomy    . Back surgery  1998,2008    L3 L4 L5 fusion  . Lumbar spine surgery  10/15/2013    L 2 L3  DISECTOMY  . Lumbar laminectomy/decompression microdiscectomy Left 10/15/2013    Procedure: LUMBAR 2-3 LEFT DISCECTOMY;  Surgeon: Venita Lick, MD;  Location: [facility] OR;  Service: Orthopedics;  Laterality: Left;   No family history on file. History  Substance Use Topics  . Smoking status: Current Every Day Smoker -- 0.50 packs/day for 18 years    Types: Cigarettes  . Smokeless tobacco: Never Used  . Alcohol Use: 1.2 oz/week    2 Glasses of wine per week    Review of Systems  All other systems reviewed and are negative.    Allergies  Bee venom  Home Medications   Current Outpatient Rx  Name  Route  Sig  Dispense  Refill  . amitriptyline (ELAVIL) 150 MG tablet    Oral   Take 150 mg by mouth at bedtime.         . docusate sodium (COLACE) 100 MG capsule   Oral   Take 1 capsule (100 mg total) by mouth 2 (two) times daily.   50 capsule   0   . DULoxetine (CYMBALTA) 30 MG capsule   Oral   Take 30 mg by mouth daily.         Marland Kitchen gabapentin (NEURONTIN) 300 MG capsule   Oral   Take 300 mg by mouth 3 (three) times daily as needed.         . hydrochlorothiazide (MICROZIDE) 12.5 MG capsule   Oral   Take 12.5 mg by mouth daily.         Marland Kitchen lisinopril (PRINIVIL,ZESTRIL) 10 MG tablet   Oral   Take 10 mg by mouth daily.         . methocarbamol (ROBAXIN) 500 MG tablet   Oral   Take 1 tablet (500 mg total) by mouth every 6 (six) hours as needed for muscle spasms.   60 tablet   0   . metoprolol succinate (TOPROL-XL) 100 MG 24 hr tablet   Oral   Take 100 mg by mouth daily. Take with or immediately following a meal.         . OLANZapine (ZYPREXA) 10 MG tablet  Oral   Take 10 mg by mouth at bedtime.         Marland Kitchen. omeprazole (PRILOSEC) 20 MG capsule   Oral   Take 1 capsule (20 mg total) by mouth daily.         . ondansetron (ZOFRAN ODT) 4 MG disintegrating tablet   Oral   Take 1 tablet (4 mg total) by mouth every 8 (eight) hours as needed for nausea or vomiting.   50 tablet   0   . oxyCODONE-acetaminophen (PERCOCET) 7.5-325 MG per tablet   Oral   Take 1-2 tablets by mouth every 4 (four) hours as needed for pain.   60 tablet   0   . polyethylene glycol (MIRALAX / GLYCOLAX) packet   Oral   Take 17 g by mouth daily.   30 each   0   . traZODone (DESYREL) 50 MG tablet   Oral   Take 50-100 mg by mouth at bedtime as needed and may repeat dose one time if needed for sleep.          BP 152/89  Pulse 100  Temp(Src) 97.7 F (36.5 C) (Oral)  Resp 16  SpO2 97% Physical Exam  Nursing note and vitals reviewed. Constitutional: He is oriented to person, place, and time. He appears well-developed and well-nourished.  Non-toxic  appearance. No distress.  HENT:  Head: Normocephalic and atraumatic.  Eyes: Conjunctivae, EOM and lids are normal. Pupils are equal, round, and reactive to light.  Neck: Normal range of motion. Neck supple. No tracheal deviation present. No mass present.  Cardiovascular: Normal rate, regular rhythm and normal heart sounds.  Exam reveals no gallop.   No murmur heard. Pulmonary/Chest: Effort normal and breath sounds normal. No stridor. No respiratory distress. He has no decreased breath sounds. He has no wheezes. He has no rhonchi. He has no rales.  Abdominal: Soft. Normal appearance and bowel sounds are normal. He exhibits no distension. There is no tenderness. There is no rebound and no CVA tenderness.  Musculoskeletal: Normal range of motion. He exhibits no edema and no tenderness.  Neurological: He is alert and oriented to person, place, and time. He has normal strength. No cranial nerve deficit or sensory deficit. GCS eye subscore is 4. GCS verbal subscore is 5. GCS motor subscore is 6.  Skin: Skin is warm and dry. No abrasion and no rash noted.  Psychiatric: His speech is normal and behavior is normal. His affect is blunt. He expresses suicidal ideation. He expresses suicidal plans. He expresses no homicidal plans.    ED Course  Procedures (including critical care time) Labs Review Labs Reviewed  SALICYLATE LEVEL  ACETAMINOPHEN LEVEL  URINE RAPID DRUG SCREEN (HOSP PERFORMED)  ETHANOL  CBC WITH DIFFERENTIAL  COMPREHENSIVE METABOLIC PANEL   Imaging Review No results found.  EKG Interpretation   None       MDM  No diagnosis found. Patient has been seen by behavior health and will be awaiting placement    Toy BakerAnthony T Charlynn Salih, MD 11/18/13 [phone]

## 2013-11-15 NOTE — BH Assessment (Signed)
Consulted with Alberteen SamFran Hobson, NP who agrees that pt meets criteria for inpatient treatment. Alberteen SamFran Hobson, [facility] 2. Notified Dr. Freida BusmanAllen of admission.

## 2013-11-15 NOTE — ED Notes (Signed)
Bed: [id number] Expected date: 11/15/13 Expected time: 7:17 PM Means of arrival: Ambulance Comments: Overdose/si

## 2013-11-15 NOTE — ED Notes (Signed)
Pt states he has taken 10 oxycodone today, he is prescribe to take 4 pills. He had 3 back surgeries in the past. His last surgery was on October 15, 2013.   Has been depressed about a lot of things: ex-wife and son.

## 2013-11-15 NOTE — ED Notes (Signed)
Per EMS pt attempted to overdose on his oxycodone. He has been taking more than he should over the last few days. Pt also had a gun and was wanting to kill himself. His brother wrestled with him to get the gun out of his hands. Pt is escorted with deputy.

## 2013-11-16 ENCOUNTER — Encounter (HOSPITAL_COMMUNITY): Payer: Self-pay | Admitting: Intensive Care

## 2013-11-16 ENCOUNTER — Inpatient Hospital Stay (HOSPITAL_COMMUNITY)
Admission: AD | Admit: 2013-11-16 | Discharge: 2013-11-21 | DRG: 885 | Disposition: A | Discharge status: Home / Self Care | Payer: Medicaid Other | Source: Intra-hospital | Attending: Psychiatry | Admitting: Psychiatry

## 2013-11-16 DIAGNOSIS — F17200 Nicotine dependence, unspecified, uncomplicated: Secondary | ICD-10-CM | POA: Diagnosis present

## 2013-11-16 DIAGNOSIS — M129 Arthropathy, unspecified: Secondary | ICD-10-CM | POA: Diagnosis present

## 2013-11-16 DIAGNOSIS — M549 Dorsalgia, unspecified: Secondary | ICD-10-CM | POA: Diagnosis present

## 2013-11-16 DIAGNOSIS — F332 Major depressive disorder, recurrent severe without psychotic features: Principal | ICD-10-CM | POA: Diagnosis present

## 2013-11-16 DIAGNOSIS — F411 Generalized anxiety disorder: Secondary | ICD-10-CM | POA: Diagnosis present

## 2013-11-16 DIAGNOSIS — G4700 Insomnia, unspecified: Secondary | ICD-10-CM | POA: Diagnosis present

## 2013-11-16 DIAGNOSIS — F1210 Cannabis abuse, uncomplicated: Secondary | ICD-10-CM | POA: Diagnosis present

## 2013-11-16 DIAGNOSIS — G8929 Other chronic pain: Secondary | ICD-10-CM | POA: Diagnosis present

## 2013-11-16 DIAGNOSIS — I10 Essential (primary) hypertension: Secondary | ICD-10-CM | POA: Diagnosis present

## 2013-11-16 DIAGNOSIS — F1110 Opioid abuse, uncomplicated: Secondary | ICD-10-CM | POA: Diagnosis present

## 2013-11-16 DIAGNOSIS — K219 Gastro-esophageal reflux disease without esophagitis: Secondary | ICD-10-CM | POA: Diagnosis present

## 2013-11-16 DIAGNOSIS — R45851 Suicidal ideations: Secondary | ICD-10-CM

## 2013-11-16 LAB — RAPID URINE DRUG SCREEN, HOSP PERFORMED
Amphetamines: NOT DETECTED
BARBITURATES: NOT DETECTED
Benzodiazepines: POSITIVE — AB
Cocaine: NOT DETECTED
Opiates: POSITIVE — AB
TETRAHYDROCANNABINOL: POSITIVE — AB

## 2013-11-16 MED ORDER — AMITRIPTYLINE HCL 25 MG PO TABS: 150.0000 mg | ORAL_TABLET | Freq: Every day | ORAL | Status: DC

## 2013-11-16 MED ORDER — HYDROCHLOROTHIAZIDE 12.5 MG PO CAPS
12.5000 mg | ORAL_CAPSULE | Freq: Every day | ORAL | Status: DC
  Administered 2013-11-16 – 2013-11-21 (×6): 12.5 mg via ORAL
  Filled 2013-11-16 (×7): qty 1

## 2013-11-16 MED ORDER — OLANZAPINE 10 MG PO TABS
10.0000 mg | ORAL_TABLET | Freq: Every day | ORAL | Status: DC
Start: 2013-11-16 — End: 2013-11-16

## 2013-11-16 MED ORDER — METHOCARBAMOL 500 MG PO TABS
750.0000 mg | ORAL_TABLET | Freq: Four times a day (QID) | ORAL | Status: DC | PRN
  Administered 2013-11-16 – 2013-11-21 (×9): 750 mg via ORAL
  Filled 2013-11-16 (×2): qty 2
  Filled 2013-11-16: qty 1.5
  Filled 2013-11-16 (×5): qty 2

## 2013-11-16 MED ORDER — KETOROLAC TROMETHAMINE 10 MG PO TABS
10.0000 mg | ORAL_TABLET | Freq: Four times a day (QID) | ORAL | Status: DC | PRN
  Administered 2013-11-17: 10 mg via ORAL
  Filled 2013-11-16: qty 1

## 2013-11-16 MED ORDER — LISINOPRIL 5 MG PO TABS
ORAL_TABLET | ORAL | Status: AC
  Filled 2013-11-16: qty 2

## 2013-11-16 MED ORDER — GABAPENTIN 300 MG PO CAPS
300.0000 mg | ORAL_CAPSULE | Freq: Three times a day (TID) | ORAL | Status: DC
  Administered 2013-11-16 – 2013-11-18 (×5): 300 mg via ORAL
  Filled 2013-11-16 (×9): qty 1

## 2013-11-16 MED ORDER — LISINOPRIL 10 MG PO TABS
10.0000 mg | ORAL_TABLET | Freq: Every day | ORAL | Status: DC
  Filled 2013-11-16: qty 1

## 2013-11-16 MED ORDER — HYDROXYZINE HCL 25 MG PO TABS
25.0000 mg | ORAL_TABLET | Freq: Four times a day (QID) | ORAL | Status: DC | PRN
  Administered 2013-11-17 – 2013-11-20 (×3): 25 mg via ORAL
  Filled 2013-11-16 (×3): qty 1

## 2013-11-16 MED ORDER — METOPROLOL SUCCINATE ER 100 MG PO TB24
100.0000 mg | ORAL_TABLET | Freq: Every day | ORAL | Status: DC
  Administered 2013-11-16 – 2013-11-21 (×6): 100 mg via ORAL
  Filled 2013-11-16 (×7): qty 1

## 2013-11-16 MED ORDER — DOCUSATE SODIUM 100 MG PO CAPS
100.0000 mg | ORAL_CAPSULE | Freq: Two times a day (BID) | ORAL | Status: DC
  Administered 2013-11-17: 100 mg via ORAL
  Filled 2013-11-16 (×14): qty 1

## 2013-11-16 MED ORDER — NICOTINE 21 MG/24HR TD PT24
21.0000 mg | MEDICATED_PATCH | Freq: Every day | TRANSDERMAL | Status: DC
Start: 2013-11-16 — End: 2013-11-21
  Administered 2013-11-16 – 2013-11-21 (×6): 21 mg via TRANSDERMAL
  Filled 2013-11-16 (×8): qty 1

## 2013-11-16 MED ORDER — PANTOPRAZOLE SODIUM 40 MG PO TBEC
DELAYED_RELEASE_TABLET | ORAL | Status: AC
  Filled 2013-11-16: qty 1

## 2013-11-16 MED ORDER — DOCUSATE SODIUM 100 MG PO CAPS: 100.0000 mg | ORAL_CAPSULE | Freq: Two times a day (BID) | ORAL | Status: DC

## 2013-11-16 MED ORDER — MAGNESIUM HYDROXIDE 400 MG/5ML PO SUSP: 30.0000 mL | Freq: Every day | ORAL | Status: DC | PRN

## 2013-11-16 MED ORDER — METOPROLOL SUCCINATE ER 50 MG PO TB24
ORAL_TABLET | ORAL | Status: AC
  Filled 2013-11-16: qty 2

## 2013-11-16 MED ORDER — HYDROCHLOROTHIAZIDE 12.5 MG PO CAPS
12.5000 mg | ORAL_CAPSULE | Freq: Every day | ORAL | Status: DC
  Filled 2013-11-16: qty 1

## 2013-11-16 MED ORDER — OLANZAPINE 10 MG PO TABS
10.0000 mg | ORAL_TABLET | Freq: Every day | ORAL | Status: DC
  Administered 2013-11-16 – 2013-11-17 (×2): 10 mg via ORAL
  Filled 2013-11-16 (×3): qty 1

## 2013-11-16 MED ORDER — METOPROLOL SUCCINATE ER 100 MG PO TB24
100.0000 mg | ORAL_TABLET | Freq: Every day | ORAL | Status: DC
  Filled 2013-11-16: qty 1

## 2013-11-16 MED ORDER — IBUPROFEN 800 MG PO TABS
800.0000 mg | ORAL_TABLET | Freq: Once | ORAL | Status: AC
  Administered 2013-11-16: 800 mg via ORAL
  Filled 2013-11-16: qty 1

## 2013-11-16 MED ORDER — PANTOPRAZOLE SODIUM 40 MG PO TBEC: 40.0000 mg | DELAYED_RELEASE_TABLET | Freq: Every day | ORAL | Status: DC

## 2013-11-16 MED ORDER — OXYCODONE-ACETAMINOPHEN 5-325 MG PO TABS
1.0000 | ORAL_TABLET | Freq: Three times a day (TID) | ORAL | Status: DC | PRN
  Administered 2013-11-16 – 2013-11-17 (×2): 1 via ORAL
  Filled 2013-11-16 (×3): qty 1

## 2013-11-16 MED ORDER — PANTOPRAZOLE SODIUM 40 MG PO TBEC
40.0000 mg | DELAYED_RELEASE_TABLET | Freq: Every day | ORAL | Status: DC
  Administered 2013-11-16 – 2013-11-21 (×6): 40 mg via ORAL
  Filled 2013-11-16 (×7): qty 1

## 2013-11-16 MED ORDER — NICOTINE 21 MG/24HR TD PT24
MEDICATED_PATCH | TRANSDERMAL | Status: AC
  Filled 2013-11-16: qty 1

## 2013-11-16 MED ORDER — CLONIDINE HCL 0.1 MG PO TABS
0.1000 mg | ORAL_TABLET | Freq: Three times a day (TID) | ORAL | Status: DC
  Administered 2013-11-16 – 2013-11-21 (×14): 0.1 mg via ORAL
  Filled 2013-11-16 (×5): qty 1
  Filled 2013-11-16: qty 42
  Filled 2013-11-16 (×7): qty 1
  Filled 2013-11-16: qty 42
  Filled 2013-11-16 (×5): qty 1
  Filled 2013-11-16: qty 42
  Filled 2013-11-16: qty 1

## 2013-11-16 MED ORDER — DOCUSATE SODIUM 100 MG PO CAPS
ORAL_CAPSULE | ORAL | Status: AC
  Filled 2013-11-16: qty 1

## 2013-11-16 MED ORDER — FLUOXETINE HCL 20 MG PO CAPS
20.0000 mg | ORAL_CAPSULE | Freq: Every day | ORAL | Status: DC
  Administered 2013-11-16 – 2013-11-18 (×3): 20 mg via ORAL
  Filled 2013-11-16 (×5): qty 1

## 2013-11-16 MED ORDER — ALUM & MAG HYDROXIDE-SIMETH 200-200-20 MG/5ML PO SUSP: 30.0000 mL | ORAL | Status: DC | PRN

## 2013-11-16 MED ORDER — ACETAMINOPHEN 325 MG PO TABS: 650.0000 mg | ORAL_TABLET | Freq: Four times a day (QID) | ORAL | Status: DC | PRN

## 2013-11-16 MED ORDER — LISINOPRIL 10 MG PO TABS
10.0000 mg | ORAL_TABLET | Freq: Every day | ORAL | Status: DC
  Administered 2013-11-16 – 2013-11-21 (×6): 10 mg via ORAL
  Filled 2013-11-16 (×7): qty 1

## 2013-11-16 MED ORDER — GABAPENTIN 300 MG PO CAPS
300.0000 mg | ORAL_CAPSULE | Freq: Three times a day (TID) | ORAL | Status: DC
  Filled 2013-11-16 (×3): qty 1

## 2013-11-16 MED ORDER — HYDROCHLOROTHIAZIDE 12.5 MG PO CAPS
ORAL_CAPSULE | ORAL | Status: AC
  Filled 2013-11-16: qty 1

## 2013-11-16 MED ORDER — AMITRIPTYLINE HCL 50 MG PO TABS
150.0000 mg | ORAL_TABLET | Freq: Every day | ORAL | Status: DC
  Administered 2013-11-16 – 2013-11-17 (×2): 150 mg via ORAL
  Filled 2013-11-16 (×3): qty 3

## 2013-11-16 NOTE — ED Notes (Signed)
Patient is resting comfortably. 

## 2013-11-16 NOTE — Progress Notes (Signed)
Adult Psychoeducational Group Note  Date:  11/16/2013 Time:  3:15PM  Group Topic/Focus:  Healthy Support Systems  Participation Level:  Did Not Attend  Barth KirksSmith, Bryan Huffman 11/16/2013, 7:26 PM

## 2013-11-16 NOTE — Progress Notes (Signed)
AC asked TTS to review IVC paper work.  MD completed Exam Form.  [facility]  [facility] and [facility] know [facility] had [facility].  Spoke with nurse and informed her of process.

## 2013-11-16 NOTE — H&P (Signed)
Psychiatric Admission Assessment Adult  Patient Identification:  Bryan Huffman Date of Evaluation:  11/16/2013 Chief Complaint:  MAJOR DEPRESSIVE DISORDER History of Present Illness::  Bryan Huffman is an 43 y.o. male who was brought into the ED by EMS after attempting to overdose on oxycodone and placing a gun to his head in an attempt to end his life. Pt reported that he has been under a lot of stress with his ex-wife and stated that "I don't want to live life without her and my son. Pt reported that he had a few drinks and has been fussing with his ex-wife today and "everything just came to a head today." Pt also reported that he recently had back surgery and has been taking more pain medication than prescribed for the past week.  Pt is alert and oriented x4. Pt denies any SI/HI or psychosis at present time; however pt attempted to overdose on oxycodone and place a gun to his head earlier today. It has been reported that his brother had to wrestle with him to get the gun away from him. Pt reported that he has been awake for the past 48 hours and his sleep cycle has been on and off for several years now. Pt reported that his appetite is good. Pt also reported a history of substance abuse. 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 Pt reported that he usually takes his medication as prescribed but over the past week he has been taking "a little more oxycodone than prescribed". Pt is divorced and has joint custody of his son. Pt is currently unemployed due to the multiple back surgeries. Pt stated that he lives at home with his parents and they have been very supportive of him. Pt denies any current legal involvement. Pt reported that he has been hospitalized in the past for an attempted overdose. Pt reported that he attempted to overdose on "pain medications, blood pressure and sleeping pills". Pt shared that his suicidal  attempts stem from issues with his ex-wife.   During admission assessment, pt rates anxiety at 10/10 and depression at 10/10. Pt denies SI, HI, and AVH, does contract for safety at this time. Pt reports only intermittent drinking but a significant amount of abuse of oxycodone, stating that he took "about 4 times the prescribed amount in a short time". Pt reports that he was "just at a low point and there was no event before I wanted to die, I just got to that point over a long period of time". Pt is requesting pain medication for a recent back surgery (verified, 4wks ago). Addressed. Pt was informed that he would only be on low dose infrequent narcotic pain medications given his past troubles with these medications.   Elements:  Location:  Inpatient, [facility]. Quality:  Worsening. Severity:  Severe. Timing:  Constant. Duration:  Chronic. Associated Signs/Synptoms: Depression Symptoms:  depressed mood, anhedonia, insomnia, psychomotor retardation, fatigue, feelings of worthlessness/guilt, difficulty concentrating, hopelessness, suicidal thoughts with specific plan, suicidal attempt, anxiety, loss of energy/fatigue, disturbed sleep, weight gain, (Hypo) Manic Symptoms:  Denies Anxiety Symptoms:  Excessive Worry, Psychotic Symptoms:  Denies PTSD Symptoms: NA Total Time spent with patient: Greater than 30 minutes  Psychiatric Specialty Exam: Physical Exam  Review of Systems  Constitutional: Positive for diaphoresis.  HENT: Negative.   Eyes: Negative.   Respiratory: Positive for shortness of breath (pt has active sinus infection).   Cardiovascular: Negative.  Gastrointestinal: Negative.   Genitourinary: Negative.   Musculoskeletal: Negative.   Skin: Positive for itching.  Neurological: Negative.   Endo/Heme/Allergies: Negative.   Psychiatric/Behavioral: Positive for depression (10/10) and substance abuse. Negative for suicidal ideas and hallucinations. The patient is  nervous/anxious (10/10) and has insomnia.     Blood pressure 161/97, pulse 110, temperature 97.5 F (36.4 C), temperature source Oral, resp. rate 20, height 5\' 6"  (1.676 m), weight 103.874 kg (229 lb), SpO2 99.00%.Body mass index is 36.98 kg/(m^2).  General Appearance: Disheveled  Eye Contact::  Good  Speech:  Clear and Coherent  Volume:  Normal  Mood:  Anxious and Depressed  Affect:  Depressed  Thought Process:  Coherent  Orientation:  Full (Time, Place, and Person)  Thought Content:  WDL  Suicidal Thoughts:  No  Homicidal Thoughts:  No  Memory:  Immediate;   Good Recent;   Good Remote;   Good  Judgement:  Fair  Insight:  Fair  Psychomotor Activity:  Decreased  Concentration:  Good  Recall:  Good  Fund of Knowledge:Good  Language: Good  Akathisia:  NA  Handed:  Right  AIMS (if indicated):     Assets:  Communication Skills Desire for Improvement Resilience  Sleep:       Musculoskeletal: Strength & Muscle Tone: within normal limits Gait & Station: normal Patient leans: N/A  Past Psychiatric History: Diagnosis:MDD, suicidal ideation, opiate abuse-moderate  Hospitalizations: 3x at [facility]  Outpatient Care: Denies  Substance Abuse Care: Denies  Self-Mutilation:Denies  Suicidal Attempts: this time  Violent Behaviors:Denies   Past Medical History:   Past Medical History  Diagnosis Date  . Hypertension   . Back pain   . Insomnia   . Mental disorder   . Depression   . GERD (gastroesophageal reflux disease)   . Hemorrhoid   . Arthritis    None. Allergies:   Allergies  Allergen Reactions  . Bee Venom Swelling   PTA Medications: Prescriptions prior to admission  Medication Sig Dispense Refill  . amitriptyline (ELAVIL) 50 MG tablet Take 150 mg by mouth at bedtime.      . docusate sodium (COLACE) 100 MG capsule Take 1 capsule (100 mg total) by mouth 2 (two) times daily.  50 capsule  0  . gabapentin (NEURONTIN) 300 MG capsule Take 300 mg by mouth 3 (three) times  daily.       . hydrochlorothiazide (MICROZIDE) 12.5 MG capsule Take 12.5 mg by mouth daily.      [facility] levofloxacin (LEVAQUIN) 500 MG tablet Take 500 mg by mouth daily.      Marland Kitchen lisinopril (PRINIVIL,ZESTRIL) 10 MG tablet Take 10 mg by mouth daily.      . metoprolol succinate (TOPROL-XL) 100 MG 24 hr tablet Take 100 mg by mouth daily. Take with or immediately following a meal.      . OLANZapine (ZYPREXA) 10 MG tablet Take 10 mg by mouth at bedtime.      Marland Kitchen omeprazole (PRILOSEC) 20 MG capsule Take 1 capsule (20 mg total) by mouth daily.      Marland Kitchen oxyCODONE-acetaminophen (PERCOCET) 7.5-325 MG per tablet Take 1-2 tablets by mouth every 4 (four) hours as needed for pain.  60 tablet  0  . polyethylene glycol (MIRALAX / GLYCOLAX) packet Take 17 g by mouth daily.  30 each  0    Previous Psychotropic Medications:  Medication/Dose  SEE MAR               Substance Abuse History in the last  12 months:  yes  Consequences of Substance Abuse: Medical Consequences:  Withdrawal symptoms  Social History:  reports that he has been smoking Cigarettes.  He has a 9 pack-year smoking history. He has never used smokeless tobacco. He reports that he drinks about 1.2 ounces of alcohol per week. He reports that he uses illicit drugs (Marijuana). Additional Social History:                      Current Place of Residence:   Place of Birth:   Family Members: Marital Status:   Children:  Sons:  Daughters: Relationships: Education:   Educational Problems/Performance: Religious Beliefs/Practices: History of Abuse (Emotional/Phsycial/Sexual) Ship broker History:   Legal History: Hobbies/Interests:  Family History:  History reviewed. No pertinent family history.  Results for orders placed during the hospital encounter of 11/15/13 (from the past 72 hour(s))  SALICYLATE LEVEL     Status: Abnormal   Collection Time    11/15/13  8:00 PM      Result Value Range   Salicylate Lvl  <5.1 (*) 2.8 - 20.0 mg/dL  ACETAMINOPHEN LEVEL     Status: None   Collection Time    11/15/13  8:00 PM      Result Value Range   Acetaminophen (Tylenol), Serum <15.0  10 - 30 ug/mL   Comment:            THERAPEUTIC CONCENTRATIONS VARY     SIGNIFICANTLY. A RANGE OF 10-30     ug/mL MAY BE AN EFFECTIVE     CONCENTRATION FOR MANY PATIENTS.     HOWEVER, SOME ARE BEST TREATED     AT CONCENTRATIONS OUTSIDE THIS     RANGE.     ACETAMINOPHEN CONCENTRATIONS     >150 ug/mL AT 4 HOURS AFTER     INGESTION AND >50 ug/mL AT 12     HOURS AFTER INGESTION ARE     OFTEN ASSOCIATED WITH TOXIC     REACTIONS.  ETHANOL     Status: Abnormal   Collection Time    11/15/13  8:00 PM      Result Value Range   Alcohol, Ethyl (B) 115 (*) 0 - 11 mg/dL   Comment:            LOWEST DETECTABLE LIMIT FOR     SERUM ALCOHOL IS 11 mg/dL     FOR MEDICAL PURPOSES ONLY  CBC WITH DIFFERENTIAL     Status: Abnormal   Collection Time    11/15/13  8:00 PM      Result Value Range   WBC 6.2  4.0 - 10.5 K/uL   RBC 4.19 (*) 4.22 - 5.81 MIL/uL   Hemoglobin 12.4 (*) 13.0 - 17.0 g/dL   HCT 37.5 (*) 39.0 - 52.0 %   MCV 89.5  78.0 - 100.0 fL   MCH 29.6  26.0 - 34.0 pg   MCHC 33.1  30.0 - 36.0 g/dL   RDW 13.9  11.5 - 15.5 %   Platelets 354  150 - 400 K/uL   Neutrophils Relative % 61  43 - 77 %   Neutro Abs 3.8  1.7 - 7.7 K/uL   Lymphocytes Relative 29  12 - 46 %   Lymphs Abs 1.8  0.7 - 4.0 K/uL   Monocytes Relative 8  3 - 12 %   Monocytes Absolute 0.5  0.1 - 1.0 K/uL   Eosinophils Relative 1  0 - 5 %   Eosinophils Absolute  0.1  0.0 - 0.7 K/uL   Basophils Relative 1  0 - 1 %   Basophils Absolute 0.0  0.0 - 0.1 K/uL  COMPREHENSIVE METABOLIC PANEL     Status: Abnormal   Collection Time    11/15/13  8:00 PM      Result Value Range   Sodium 138  137 - 147 mEq/L   Potassium 4.0  3.7 - 5.3 mEq/L   Chloride 96  96 - 112 mEq/L   CO2 24  19 - 32 mEq/L   Glucose, Bld 101 (*) 70 - 99 mg/dL   BUN 6  6 - 23 mg/dL    Creatinine, Ser 0.78  0.50 - 1.35 mg/dL   Calcium 9.6  8.4 - 10.5 mg/dL   Total Protein 7.6  6.0 - 8.3 g/dL   Albumin 3.8  3.5 - 5.2 g/dL   AST 41 (*) 0 - 37 U/L   Comment: SLIGHT HEMOLYSIS     HEMOLYSIS AT THIS LEVEL MAY AFFECT RESULT   ALT 28  0 - 53 U/L   Alkaline Phosphatase 73  39 - 117 U/L   Total Bilirubin <0.2 (*) 0.3 - 1.2 mg/dL   GFR calc non Af Amer >90  >90 mL/min   GFR calc Af Amer >90  >90 mL/min   Comment: (NOTE)     The eGFR has been calculated using the CKD EPI equation.     This calculation has not been validated in all clinical situations.     eGFR's persistently <90 mL/min signify possible Chronic Kidney     Disease.  URINE RAPID DRUG SCREEN (HOSP PERFORMED)     Status: Abnormal   Collection Time    11/16/13 12:36 AM      Result Value Range   Opiates POSITIVE (*) NONE DETECTED   Cocaine NONE DETECTED  NONE DETECTED   Benzodiazepines POSITIVE (*) NONE DETECTED   Amphetamines NONE DETECTED  NONE DETECTED   Tetrahydrocannabinol POSITIVE (*) NONE DETECTED   Barbiturates NONE DETECTED  NONE DETECTED   Comment:            DRUG SCREEN FOR MEDICAL PURPOSES     ONLY.  IF CONFIRMATION IS NEEDED     FOR ANY PURPOSE, NOTIFY LAB     WITHIN 5 DAYS.                LOWEST DETECTABLE LIMITS     FOR URINE DRUG SCREEN     Drug Class       Cutoff (ng/mL)     Amphetamine      1000     Barbiturate      200     Benzodiazepine   032     Tricyclics       122     Opiates          300     Cocaine          300     THC              50   Psychological Evaluations:  Assessment:   DSM5: Substance/Addictive Disorders:  Opioid Disorder - Moderate (304.00) Depressive Disorders:  Major Depressive Disorder - Severe (296.23)  AXIS I:  Major Depression, Recurrent severe and Substance Abuse AXIS II:  Deferred AXIS III:   Past Medical History  Diagnosis Date  . Hypertension   . Back pain   . Insomnia   . Mental disorder   . Depression   .  GERD (gastroesophageal reflux disease)    . Hemorrhoid   . Arthritis    AXIS IV:  occupational problems, other psychosocial or environmental problems and problems related to social environment AXIS V:  41-50 serious symptoms  Treatment Plan/Recommendations:   Review of chart, vital signs, medications, and notes.  1-Individual and group therapy  2-Medication management for depression and anxiety: Medications reviewed with the patient and she stated no untoward effects. -Resume Levaquin from home meds to complete 10 day course for sinus infection persisting beyond 7 days -Add Toradol $RemoveBefo'10mg'BIsXfKwoEkg$  PO Q6H PRN moderate pain -Add Percocet 5-325 PO Q8H PRN severe pain -Continue Robaxin for back spasm (verified spinal surgery 4wks ago) -Add Trazodone $RemoveBefore'50mg'RyLJmtQGVxVJZ$  QHS PRN for insomnia -Add Clonidine 0.$RemoveBeforeDE'1mg'EGOzKpuFIcSSPQI$  tid for oxycodone withdrawal symptoms (itching, diaphoresis, anxiety) -Add Prozac $RemoveBef'20mg'amzFaMAuUA$  daily for depression -Add Vistaril $RemoveBefor'25mg'gASYLOYCJVVo$  PO Q6H PRN for anxiety -Allow use of TLSO back brace inpatient for stability and safety  3-Coping skills for depression, anxiety  4-Continue crisis stabilization and management  5-Address health issues--monitoring vital signs, stable  6-Treatment plan in progress to prevent relapse of depression and anxiety  Treatment Plan Summary: Daily contact with patient to assess and evaluate symptoms and progress in treatment Medication management Current Medications:  Current Facility-Administered Medications  Medication Dose Route Frequency Provider Last Rate Last Dose  . acetaminophen (TYLENOL) tablet 650 mg  650 mg Oral Q6H PRN Lurena Nida, NP      . alum & mag hydroxide-simeth (MAALOX/MYLANTA) 200-200-20 MG/5ML suspension 30 mL  30 mL Oral Q4H PRN Lurena Nida, NP      . amitriptyline (ELAVIL) tablet 150 mg  150 mg Oral QHS Lurena Nida, NP      . docusate sodium (COLACE) capsule 100 mg  100 mg Oral BID Lurena Nida, NP      . gabapentin (NEURONTIN) capsule 300 mg  300 mg Oral TID Lurena Nida, NP      .  hydrochlorothiazide (MICROZIDE) capsule 12.5 mg  12.5 mg Oral Daily Lurena Nida, NP   12.5 mg at 11/16/13 0959  . lisinopril (PRINIVIL,ZESTRIL) tablet 10 mg  10 mg Oral Daily Lurena Nida, NP   10 mg at 11/16/13 0102  . magnesium hydroxide (MILK OF MAGNESIA) suspension 30 mL  30 mL Oral Daily PRN Lurena Nida, NP      . methocarbamol (ROBAXIN) tablet 750 mg  750 mg Oral Q6H PRN Benjamine Mola, FNP      . metoprolol succinate (TOPROL-XL) 24 hr tablet 100 mg  100 mg Oral Daily Lurena Nida, NP   100 mg at 11/16/13 0959  . OLANZapine (ZYPREXA) tablet 10 mg  10 mg Oral QHS Lurena Nida, NP      . pantoprazole (PROTONIX) EC tablet 40 mg  40 mg Oral Daily Lurena Nida, NP   40 mg at 11/16/13 7253     Observation Level/Precautions:  15 minute checks  Laboratory:  Labs resulted, reviewed, and stable at this time.   Psychotherapy:  Group therapy, individual therapy, psychoeducation  Medications:  See MAR above  Consultations: None    Discharge Concerns: None    Estimated LOS: 5-7 days  Other:  N/A   I certify that inpatient services furnished can reasonably be expected to improve the patient's condition.   Benjamine Mola, [place] 2/8/201512:10 PM  I have personally seen the patient and agreed with the findings and involved in the treatment plan. Berniece Andreas, MD

## 2013-11-16 NOTE — Progress Notes (Signed)
Pt has just arrived.

## 2013-11-16 NOTE — ED Notes (Signed)
[facility]. Officer/dispatch called to inform that another officer will p/u and transport. Pt asleep,rest in bed.

## 2013-11-16 NOTE — [facility] Suicide Risk Assessment (Signed)
   Nursing information obtained from:    Demographic factors:    Current Mental Status:    Loss Factors:    Historical Factors:    Risk Reduction Factors:    Total Time spent with patient: 45 minutes  CLINICAL FACTORS:   Depression:   Anhedonia Hopelessness Impulsivity Insomnia Chronic Pain More than one psychiatric diagnosis Previous Psychiatric Diagnoses and Treatments Medical Diagnoses and Treatments/Surgeries  Psychiatric Specialty Exam: Physical Exam  ROS  Blood pressure 161/97, pulse 110, temperature 97.5 F (36.4 C), temperature source Oral, resp. rate 20, height 5\' 6"  (1.676 m), weight 229 lb (103.874 kg), SpO2 99.00%.Body mass index is 36.98 kg/(m^2).  General Appearance: Disheveled  Eye SolicitorContact::  Fair  Speech:  Clear and Coherent  Volume:  Normal  Mood:  Anxious, Depressed and Dysphoric  Affect:  Appropriate, Constricted and Depressed  Thought Process:  Coherent  Orientation:  Full (Time, Place, and Person)  Thought Content:  Rumination  Suicidal Thoughts:  Yes.  with intent/plan  Homicidal Thoughts:  No  Memory:  Immediate;   Fair Recent;   Fair Remote;   Fair  Judgement:  Impaired  Insight:  Lacking  Psychomotor Activity:  Psychomotor Retardation and Restlessness  Concentration:  Fair  Recall:  [employer]Fair  Fund of Knowledge:Fair  Language: Fair  Akathisia:  No  Handed:  Right  AIMS (if indicated):     Assets:  Housing  Sleep:      Musculoskeletal: Strength & Muscle Tone: within normal limits Gait & Station: normal Patient leans: N/A  COGNITIVE FEATURES THAT CONTRIBUTE TO RISK:  Closed-mindedness Loss of executive function Polarized thinking    SUICIDE RISK:   Moderate:  Frequent suicidal ideation with limited intensity, and duration, some specificity in terms of plans, no associated intent, good self-control, limited dysphoria/symptomatology, some risk factors present, and identifiable protective factors, including available and accessible social  support.  PLAN OF CARE:  I certify that inpatient services furnished can reasonably be expected to improve the patient's condition.  Ransom Nickson T. 11/16/2013, 1:59 PM

## 2013-11-16 NOTE — Tx Team (Signed)
Initial Interdisciplinary Treatment Plan  PATIENT STRENGTHS: (choose at least two) Average or above average intelligence Capable of independent living Communication skills Supportive family/friends  PATIENT STRESSORS: Financial difficulties Health problems Marital or family conflict Medication change or noncompliance Occupational concerns Substance abuse   PROBLEM LIST: Problem List/Patient Goals Date to be addressed Date deferred Reason deferred Estimated date of resolution  Depression 11/16/13                                                      DISCHARGE CRITERIA:  Ability to meet basic life and health needs Adequate post-discharge living arrangements Improved stabilization in mood, thinking, and/or behavior Medical problems require only outpatient monitoring  PRELIMINARY DISCHARGE PLAN: Attend aftercare/continuing care group Outpatient therapy  PATIENT/FAMIILY INVOLVEMENT: This treatment plan has been presented to and reviewed with the patient, Bryan Huffman.  Nestor RampDUNCAN, Krystine [facility] 11/16/2013, 9:33 AM

## 2013-11-16 NOTE — [facility] Group Notes (Signed)
[facility] Group Notes:  (Clinical Social Work)  11/16/2013   1:15-2:15PM  Summary of Progress/Problems:  The main focus of today's process group was to   identify the patient's current support system and decide on other supports that can be put in place.  The picture on workbook was used to discuss why additional supports are needed.  An emphasis was placed on using counselor, doctor, therapy groups, 12-step groups, and problem-specific support groups to expand supports.   There was also an extensive discussion about what constitutes a healthy support versus an unhealthy support.  The patient listened without comment.  Type of Therapy:  Process Group  Participation Level:  Active  Participation Quality:  Attentive  Affect:  Blunted and Depressived  Cognitive:   Oriented  Insight:  Limited  Engagement in Therapy:  Limited  Modes of Intervention:  Education,  Support and ConAgra FoodsProcessing  Bryan Berroa Grossman-Orr, LCSW 11/16/2013, 4:00pm

## 2013-11-16 NOTE — ED Notes (Addendum)
[place]. Pt awaiting transport since 0153.

## 2013-11-17 DIAGNOSIS — F1110 Opioid abuse, uncomplicated: Secondary | ICD-10-CM

## 2013-11-17 MED ORDER — OXYCODONE-ACETAMINOPHEN 5-325 MG PO TABS
1.0000 | ORAL_TABLET | Freq: Four times a day (QID) | ORAL | Status: DC | PRN
  Administered 2013-11-17 – 2013-11-21 (×13): 1 via ORAL
  Filled 2013-11-17 (×12): qty 1

## 2013-11-17 MED ORDER — TRAZODONE HCL 100 MG PO TABS
100.0000 mg | ORAL_TABLET | Freq: Every evening | ORAL | Status: DC | PRN
  Administered 2013-11-17 – 2013-11-19 (×3): 100 mg via ORAL
  Filled 2013-11-17 (×3): qty 1

## 2013-11-17 NOTE — Progress Notes (Signed)
[facility] MD Progress Note  11/17/2013 6:38 PM Bryan Huffman  MRN:  [id number] Subjective:   HPI: Bryan Huffman is an 43 y.o. male who was brought into the ED by EMS after attempting to overdose on oxycodone and placing a gun to his head in an attempt to end his life. Pt reported that he has been under a lot of stress with his ex-wife and stated that "I don't want to live life without her and my son. Pt reported that he had a few drinks and has been fussing with his ex-wife today and "everything just came to a head today." Pt also reported that he recently had back surgery and has been taking more pain medication than prescribed for the past week. During admission assessment, pt rates anxiety at 10/10 and depression at 10/10. Pt denies SI, HI, and AVH, does contract for safety at this time. Pt reports only intermittent drinking but a significant amount of abuse of oxycodone, stating that he took "about 4 times the prescribed amount in a short time". Pt reports that he was "just at a low point and there was no event before I wanted to die, I just got to that point over a long period of time". Pt is requesting pain medication for a recent back surgery (verified, 4wks ago). Addressed. Pt was informed that he would only be on low dose infrequent narcotic pain medications given his past troubles with these medications.   Assessment: During today's assessment, pt rates anxiety at 7/10 and depression at 8/10. Pt states that he has improved a great deal and that he has been participating in the groups. Pt is tearful during the assessment, ruminating about his ex wife stating that he hoped he would be back with her, but he knows it is not possible.  Pt denies SI, HI, and AVH, contracts for safety.  Pt is in agreement with treatment plan and medication regimen at this time and denies other physical and psychological concerns.    Diagnosis:   DSM5: Substance/Addictive Disorders:  Opioid Disorder - Moderate  (304.00) Depressive Disorders:  Major Depressive Disorder - Severe (296.23) Total Time spent with patient: 30 minutes  Axis I: Major Depression, Recurrent severe and Substance Abuse Axis II: Deferred Axis III:  Past Medical History  Diagnosis Date  . Hypertension   . Back pain   . Insomnia   . Mental disorder   . Depression   . GERD (gastroesophageal reflux disease)   . Hemorrhoid   . Arthritis    Axis IV: other psychosocial or environmental problems and problems related to social environment Axis V: 41-50 serious symptoms  ADL's:  Intact  Sleep: Poor  Appetite:  Good  Suicidal Ideation:  Denies Homicidal Ideation:  Denies AEB (as evidenced by):  Psychiatric Specialty Exam: Physical Exam  Review of Systems  Constitutional: Negative.   HENT: Negative.   Eyes: Negative.   Respiratory: Negative.   Cardiovascular: Negative.   Gastrointestinal: Negative.   Genitourinary: Negative.   Musculoskeletal: Negative.   Skin: Negative.   Neurological: Negative.   Endo/Heme/Allergies: Negative.   Psychiatric/Behavioral: Negative.     Blood pressure 104/72, pulse 69, temperature 97.9 F (36.6 C), temperature source Oral, resp. rate 18, height $RemoveBe'5\' 6"'ZBGBZMMGL$  (1.676 m), weight 103.874 kg (229 lb), SpO2 99.00%.Body mass index is 36.98 kg/(m^2).  General Appearance: Casual  Eye Contact::  Good  Speech:  Clear and Coherent  Volume:  Normal  Mood:  Anxious and Depressed  Affect:  Depressed and  Tearful  Thought Process:  Circumstantial and Coherent  Orientation:  Full (Time, Place, and Person)  Thought Content:  Rumination  Suicidal Thoughts:  No  Homicidal Thoughts:  No  Memory:  Immediate;   Fair Recent;   Fair Remote;   Fair  Judgement:  Fair  Insight:  Fair  Psychomotor Activity:  Normal  Concentration:  Good  Recall:  Good  Fund of Knowledge:Good  Language: Good  Akathisia:  NA  Handed:  Right  AIMS (if indicated):     Assets:  Communication Skills Desire for  Improvement Resilience  Sleep:  Number of Hours: 3.5   Musculoskeletal: Strength & Muscle Tone: within normal limits Gait & Station: normal Patient leans: N/A  Current Medications: Current Facility-Administered Medications  Medication Dose Route Frequency Provider Last Rate Last Dose  . acetaminophen (TYLENOL) tablet 650 mg  650 mg Oral Q6H PRN Lurena Nida, NP      . alum & mag hydroxide-simeth (MAALOX/MYLANTA) 200-200-20 MG/5ML suspension 30 mL  30 mL Oral Q4H PRN Lurena Nida, NP      . amitriptyline (ELAVIL) tablet 150 mg  150 mg Oral QHS Lurena Nida, NP   150 mg at 11/16/13 2218  . cloNIDine (CATAPRES) tablet 0.1 mg  0.1 mg Oral TID Benjamine Mola, FNP   0.1 mg at 11/17/13 1722  . docusate sodium (COLACE) capsule 100 mg  100 mg Oral BID Lurena Nida, NP   100 mg at 11/17/13 0846  . FLUoxetine (PROZAC) capsule 20 mg  20 mg Oral Daily Benjamine Mola, FNP   20 mg at 11/17/13 0847  . gabapentin (NEURONTIN) capsule 300 mg  300 mg Oral TID Lurena Nida, NP   300 mg at 11/17/13 1723  . hydrochlorothiazide (MICROZIDE) capsule 12.5 mg  12.5 mg Oral Daily Lurena Nida, NP   12.5 mg at 11/17/13 0848  . hydrOXYzine (ATARAX/VISTARIL) tablet 25 mg  25 mg Oral Q6H PRN Benjamine Mola, FNP   25 mg at 11/17/13 0205  . lisinopril (PRINIVIL,ZESTRIL) tablet 10 mg  10 mg Oral Daily Lurena Nida, NP   10 mg at 11/17/13 0849  . magnesium hydroxide (MILK OF MAGNESIA) suspension 30 mL  30 mL Oral Daily PRN Lurena Nida, NP      . methocarbamol (ROBAXIN) tablet 750 mg  750 mg Oral Q6H PRN Benjamine Mola, FNP   750 mg at 11/16/13 2217  . metoprolol succinate (TOPROL-XL) 24 hr tablet 100 mg  100 mg Oral Daily Lurena Nida, NP   100 mg at 11/17/13 0849  . nicotine (NICODERM CQ - dosed in mg/24 hours) patch 21 mg  21 mg Transdermal Q0600 Durward Parcel, MD   21 mg at 11/17/13 0855  . OLANZapine (ZYPREXA) tablet 10 mg  10 mg Oral QHS Lurena Nida, NP   10 mg at 11/16/13 2218  .  oxyCODONE-acetaminophen (PERCOCET/ROXICET) 5-325 MG per tablet 1 tablet  1 tablet Oral Q6H PRN Benjamine Mola, FNP   1 tablet at 11/17/13 1736  . pantoprazole (PROTONIX) EC tablet 40 mg  40 mg Oral Daily Lurena Nida, NP   40 mg at 11/17/13 0850  . traZODone (DESYREL) tablet 100 mg  100 mg Oral QHS PRN Benjamine Mola, FNP        Lab Results:  Results for orders placed during the hospital encounter of 11/15/13 (from the past 48 hour(s))  SALICYLATE LEVEL     Status:  Abnormal   Collection Time    11/15/13  8:00 PM      Result Value Range   Salicylate Lvl <1.4 (*) 2.8 - 20.0 mg/dL  ACETAMINOPHEN LEVEL     Status: None   Collection Time    11/15/13  8:00 PM      Result Value Range   Acetaminophen (Tylenol), Serum <15.0  10 - 30 ug/mL   Comment:            THERAPEUTIC CONCENTRATIONS VARY     SIGNIFICANTLY. A RANGE OF 10-30     ug/mL MAY BE AN EFFECTIVE     CONCENTRATION FOR MANY PATIENTS.     HOWEVER, SOME ARE BEST TREATED     AT CONCENTRATIONS OUTSIDE THIS     RANGE.     ACETAMINOPHEN CONCENTRATIONS     >150 ug/mL AT 4 HOURS AFTER     INGESTION AND >50 ug/mL AT 12     HOURS AFTER INGESTION ARE     OFTEN ASSOCIATED WITH TOXIC     REACTIONS.  ETHANOL     Status: Abnormal   Collection Time    11/15/13  8:00 PM      Result Value Range   Alcohol, Ethyl (B) 115 (*) 0 - 11 mg/dL   Comment:            LOWEST DETECTABLE LIMIT FOR     SERUM ALCOHOL IS 11 mg/dL     FOR MEDICAL PURPOSES ONLY  CBC WITH DIFFERENTIAL     Status: Abnormal   Collection Time    11/15/13  8:00 PM      Result Value Range   WBC 6.2  4.0 - 10.5 K/uL   RBC 4.19 (*) 4.22 - 5.81 MIL/uL   Hemoglobin 12.4 (*) 13.0 - 17.0 g/dL   HCT 37.5 (*) 39.0 - 52.0 %   MCV 89.5  78.0 - 100.0 fL   MCH 29.6  26.0 - 34.0 pg   MCHC 33.1  30.0 - 36.0 g/dL   RDW 13.9  11.5 - 15.5 %   Platelets 354  150 - 400 K/uL   Neutrophils Relative % 61  43 - 77 %   Neutro Abs 3.8  1.7 - 7.7 K/uL   Lymphocytes Relative 29  12 - 46 %    Lymphs Abs 1.8  0.7 - 4.0 K/uL   Monocytes Relative 8  3 - 12 %   Monocytes Absolute 0.5  0.1 - 1.0 K/uL   Eosinophils Relative 1  0 - 5 %   Eosinophils Absolute 0.1  0.0 - 0.7 K/uL   Basophils Relative 1  0 - 1 %   Basophils Absolute 0.0  0.0 - 0.1 K/uL  COMPREHENSIVE METABOLIC PANEL     Status: Abnormal   Collection Time    11/15/13  8:00 PM      Result Value Range   Sodium 138  137 - 147 mEq/L   Potassium 4.0  3.7 - 5.3 mEq/L   Chloride 96  96 - 112 mEq/L   CO2 24  19 - 32 mEq/L   Glucose, Bld 101 (*) 70 - 99 mg/dL   BUN 6  6 - 23 mg/dL   Creatinine, Ser 0.78  0.50 - 1.35 mg/dL   Calcium 9.6  8.4 - 10.5 mg/dL   Total Protein 7.6  6.0 - 8.3 g/dL   Albumin 3.8  3.5 - 5.2 g/dL   AST 41 (*) 0 - 37 U/L   Comment:  SLIGHT HEMOLYSIS     HEMOLYSIS AT THIS LEVEL MAY AFFECT RESULT   ALT 28  0 - 53 U/L   Alkaline Phosphatase 73  39 - 117 U/L   Total Bilirubin <0.2 (*) 0.3 - 1.2 mg/dL   GFR calc non Af Amer >90  >90 mL/min   GFR calc Af Amer >90  >90 mL/min   Comment: (NOTE)     The eGFR has been calculated using the CKD EPI equation.     This calculation has not been validated in all clinical situations.     eGFR's persistently <90 mL/min signify possible Chronic Kidney     Disease.  URINE RAPID DRUG SCREEN (HOSP PERFORMED)     Status: Abnormal   Collection Time    11/16/13 12:36 AM      Result Value Range   Opiates POSITIVE (*) NONE DETECTED   Cocaine NONE DETECTED  NONE DETECTED   Benzodiazepines POSITIVE (*) NONE DETECTED   Amphetamines NONE DETECTED  NONE DETECTED   Tetrahydrocannabinol POSITIVE (*) NONE DETECTED   Barbiturates NONE DETECTED  NONE DETECTED   Comment:            DRUG SCREEN FOR MEDICAL PURPOSES     ONLY.  IF CONFIRMATION IS NEEDED     FOR ANY PURPOSE, NOTIFY LAB     WITHIN 5 DAYS.                LOWEST DETECTABLE LIMITS     FOR URINE DRUG SCREEN     Drug Class       Cutoff (ng/mL)     Amphetamine      1000     Barbiturate      200     Benzodiazepine    664     Tricyclics       403     Opiates          300     Cocaine          300     THC              50    Physical Findings: AIMS: Facial and Oral Movements Muscles of Facial Expression: None, normal Lips and Perioral Area: None, normal Jaw: None, normal Tongue: None, normal,Extremity Movements Upper (arms, wrists, hands, fingers): None, normal Lower (legs, knees, ankles, toes): None, normal, Trunk Movements Neck, shoulders, hips: None, normal, Overall Severity Severity of abnormal movements (highest score from questions above): None, normal Incapacitation due to abnormal movements: None, normal Patient's awareness of abnormal movements (rate only patient's report): No Awareness, Dental Status Current problems with teeth and/or dentures?: No Does patient usually wear dentures?: No  CIWA:  CIWA-Ar Total: 2 COWS:  COWS Total Score: 3  Treatment Plan Summary: Daily contact with patient to assess and evaluate symptoms and progress in treatment Medication management  Plan: Review of chart, vital signs, medications, and notes.  1-Individual and group therapy  2-Medication management for depression and anxiety: Medications reviewed with the patient and she stated no untoward effects. -Increase Trazodone from $RemoveBefore'50mg'csHSxsElLZrNg$  to $R'100mg'Dt$  for persistent insomnia -Modify Percocet 5-325 from q8h PRN to q6h PRN for pain 3-Coping skills for depression, anxiety  4-Continue crisis stabilization and management  5-Address health issues--monitoring vital signs, stable  6-Treatment plan in progress to prevent relapse of depression and anxiety  Medical Decision Making Problem Points:  Established problem, stable/improving (1), Review of last therapy session (1) and Review of psycho-social stressors (1) Data Points:  Review or order clinical lab tests (1) Review or order medicine tests (1) Review of medication regiment & side effects (2) Review of new medications or change in dosage (2)  I certify that  inpatient services furnished can reasonably be expected to improve the patient's condition.   Benjamine Mola, FNP-BC 11/17/2013, 6:38 PM Agree with assessment and plan Geralyn Flash A. Sabra Heck, M.D.

## 2013-11-17 NOTE — Progress Notes (Signed)
Writer has observed patient up in the dayroom watching tv and interacting appropriately with peers. Writer spoke with patient 1:1 and he is remorseful of the situation that brought him here. He admits that his drinking has had a lot to do with his current family issues. Writer encouraged patient to attend groups offered throughout the day and possibly attend AA meetings on [address]. He currently denies si/hi/a/v hallucinations. Safety maintained on unit with 15 min checks.

## 2013-11-17 NOTE — [facility] Group Notes (Signed)
[facility]   11/17/2013 10:00 AM  Participation Quality:  Did not attend group.  Bryan Huffman, Bryan Huffman

## 2013-11-17 NOTE — [facility] Group Notes (Addendum)
[facility] LCSW Group Therapy          Overcoming Obstacles       1:15 -2:30        11/17/2013   3:32 PM     Type of Therapy:  Group Therapy  Participation Level:  Appropriate  Participation Quality:  Appropriate  Affect:  Appropriate, Alert  Cognitive:  Attentive Appropriate  Insight: Developing/Improving Engaged  Engagement in Therapy: Developing/Imprvoing Engaged  Modes of Intervention:  Discussion Exploration  Education Rapport BuildingProblem-Solving Support  Summary of Progress/Problems:  The main focus of today's group was overcoming obstacles.  He shared he obstacle he has to overcome is moving backwards instead of forwards.  He talked about medical concerns (back problems) that are effecting his ability to find and maintain work.  He advised he is considering seeking disability income.  Patient able to identify appropriate coping skills.   Wynn BankerHodnett, Kristion Holifield Hairston 11/17/2013   3:32 PM

## 2013-11-17 NOTE — Progress Notes (Signed)
Adult Psychoeducational Group Note  Date:  11/17/2013 Time:  10:27 PM  Group Topic/Focus:  Goals Group:   The focus of this group is to help patients establish daily goals to achieve during treatment and discuss how the patient can incorporate goal setting into their daily lives to aide in recovery.  Participation Level:  Active  Participation Quality:  Appropriate  Affect:  Appropriate  Cognitive:  Appropriate  Insight: Appropriate  Engagement in Group:  Engaged  Modes of Intervention:  Discussion  Additional Comments:  Pt stated that the day was ok but hopes things get better.  Terie PurserParker, Izaac Reisig R 11/17/2013, 10:27 PM

## 2013-11-17 NOTE — Progress Notes (Signed)
D:  Patient's self inventory sheet, patient has poor sleep, improving appetite, low energy level, improving attention span.  Rated depression and anxiety 6, hopeless 3.  Denied withdrawals.  Has back pain, worst pain 7, zero pain goal.  Plans to return home after discharge.  No problems with meds after discharge, has medicaid. A:  Medications administered per MD orders.  Emotional support and encouragement given patient. R:  Denied SI and HI.   Denied A/V hallucinations.  Will continue to monitor patient for safety with 15 minute checks.  Safety maintained.  Patient slept this morning, declined groups.  Took shower during lunch time.  Wearing back brace this afternoon.

## 2013-11-17 NOTE — Progress Notes (Signed)
Recreation Therapy Notes  Date: 02.09.2015 Time: 2:45pm Location: [address] Dayroom   Group Topic: Wellness  Goal Area(s) Addresses:  Patient will identify dimension of wellness they most struggle with.  Patient will identify at least 3 ways to invest in that type of wellness.  Patient will identify benefit of identifying areas of improvement.   Behavioral Response: Appropriate  Intervention: Art  Activity: Patients were provided with a worksheet outlining 6 dimensions of wellness. Using this worksheet patients were asked to identify the area they most need to invest in. Using art supplies Conservation officer, historic buildings(construction paper, markers, crayons, magazine clippings, scissors, and glue) patients were asked to design a poster around the three things they are going to do to invest in their wellness.   Education: Wellness, Building control surveyorDischarge Planning.   Education Outcome: Acknowledges understanding   Clinical Observations/Feedback: Patient attended group session, actively listened to group discussion, but chose not to participate in group activity.    Marykay Lexenise L Alexea Blase, LRT/CTRS  Jearl KlinefelterBlanchfield, Bora Bost L 11/17/2013 5:26 PM

## 2013-11-17 NOTE — Tx Team (Signed)
Interdisciplinary Treatment Plan Update   Date Reviewed:  11/17/2013  Time Reviewed:  8:33 AM  Progress in Treatment:   Attending groups: Yes Participating in groups: Yes Taking medication as prescribed: Yes  Tolerating medication: Yes Family/Significant other contact made:  No, but will ask patient for consent for collateral contact Patient understands diagnosis: Yes  Discussing patient identified problems/goals with staff: Yes Medical problems stabilized or resolved: Yes Denies suicidal/homicidal ideation: Yes Patient has not harmed self or others: Yes  For review of initial/current patient goals, please see plan of care.  Estimated Length of Stay:  3-5 days  Reasons for Continued Hospitalization:  Anxiety Depression Medication stabilization   New Problems/Goals identified:    Discharge Plan or Barriers:   Home with outpatient follow up to be determined  Additional Comments:  Bryan Huffman is an 43 y.o. male who was brought into the ED by EMS after attempting to overdose on oxycodone and placing a gun to his head in an attempt to end his life. Pt reported that he has been under a lot of stress with his ex-wife and stated that "I don't want to live life without her and my son. Pt reported that he had a few drinks and has been fussing with his ex-wife today and "everything just came to a head today." Pt also reported that he recently had back surgery and has been taking more pain medication than prescribed for the past week.  Pt is alert and oriented x4. Pt denies any SI/HI or psychosis at present time; however pt attempted to overdose on oxycodone and place a gun to his head earlier today. It has been reported that his brother had to wrestle with him to get the gun away from him. Pt reported that he has been awake for the past 48 hours and his sleep cycle has been on and off for several years now. Pt reported that his appetite is good. Pt also reported a history of substance abuse.  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 Pt reported that he usually takes his medication as prescribed but over the past week he has been taking "a little more oxycodone than prescribed". Pt is divorced and has joint custody of his son. Pt is currently unemployed due to the multiple back surgeries.    Attendees:  Patient:  11/17/2013 8:33 AM   Signature: 11/17/2013 8:33 AM  Signature:   11/17/2013 8:33 AM  Signature:  Claudette Headonrad Withrow, NP 11/17/2013 8:33 AM  Signature:Beverly Terrilee CroakKnight, RN 11/17/2013 8:33 AM  Signature:  Neill Loftarol Davis RN 11/17/2013 8:33 AM  Signature:  Juline PatchQuylle Hanson Medeiros, LCSW 11/17/2013 8:33 AM  Signature:  Reyes Ivanhelsea Horton, LCSW 11/17/2013 8:33 AM  Signature:  Tomasita Morrowelora Sutton, Care Coordinator [facility] 11/17/2013 8:33 AM  Signature:  Aloha GellKrista Dopson, RN 11/17/2013 8:33 AM  Signature: 11/17/2013  8:33 AM  Signature:   Onnie BoerJennifer Clark, RN [facility] 11/17/2013  8:33 AM  Signature:   11/17/2013  8:33 AM    Scribe for Treatment Team:   Juline PatchQuylle Dakiya Puopolo,  11/17/2013 8:33 AM

## 2013-11-18 DIAGNOSIS — G4700 Insomnia, unspecified: Secondary | ICD-10-CM

## 2013-11-18 MED ORDER — FLUOXETINE HCL 20 MG PO CAPS
40.0000 mg | ORAL_CAPSULE | Freq: Every day | ORAL | Status: DC
  Administered 2013-11-19 – 2013-11-21 (×3): 40 mg via ORAL
  Filled 2013-11-18 (×2): qty 2
  Filled 2013-11-18: qty 28
  Filled 2013-11-18 (×2): qty 2

## 2013-11-18 MED ORDER — GABAPENTIN 400 MG PO CAPS
400.0000 mg | ORAL_CAPSULE | Freq: Three times a day (TID) | ORAL | Status: DC
  Administered 2013-11-18 – 2013-11-19 (×4): 400 mg via ORAL
  Filled 2013-11-18 (×9): qty 1

## 2013-11-18 MED ORDER — AMITRIPTYLINE HCL 75 MG PO TABS
75.0000 mg | ORAL_TABLET | Freq: Every day | ORAL | Status: DC
  Administered 2013-11-18: 75 mg via ORAL
  Filled 2013-11-18 (×3): qty 1

## 2013-11-18 NOTE — Progress Notes (Addendum)
D:  Patient's self inventory sheet, patient has fair sleep, good appetite, low energy level, good attention span.  Rated depression and hopeless 5, anxiety 4.  Denied withdrawals.  Denied SI.  Pain lower back, surgery, worst pain 8, zero pain goal.  After discharge, plans to stop drinking/drugs.  Does have discharge plans.  No problems taking medications after discharge. A:  Medications administered per MD orders.  Emotional support and encouragement given patient. R:  Denied SI and HI.  Contracts for safety.  Denied A/V hallucinations.  Will continue to monitor patient for safety with 15 minute checks.  Safety maintained. Patient stated neurotin 400 mg is not touching his pain.  Patient feels neurotin makes him feel groggy.

## 2013-11-18 NOTE — [facility] Suicide Risk Assessment (Signed)
[facility] INPATIENT:  Family/Significant Other Suicide Prevention Education  Suicide Prevention Education:  Education Completed; [address] Mc[place], Father, [phone] has been identified by the patient as the family member/significant other with whom the patient will be residing, and identified as the person(s) who will aid the patient in the event of a mental health crisis (suicidal ideations/suicide attempt).  With written consent from the patient, the family member/significant other has been provided the following suicide prevention education, prior to the and/or following the discharge of the patient.  Father advised he has received SPE during previous admissions.  The suicide prevention education provided includes the following:  Suicide risk factors  Suicide prevention and interventions  National Suicide Hotline telephone number  [facility] assessment telephone number  [facility] Assistance 911  [facility] and/or [facility]idential Mobile Crisis [facility] telephone number  Request made of family/significant other to:  Remove weapons (e.g., guns, rifles, knives), all items previously/currently identified as safety concern.  Father advised patient does not have access to guns.  Remove drugs/medications (over-the-counter, prescriptions, illicit drugs), all items previously/currently identified as a safety concern.  The family member/significant other verbalizes understanding of the suicide prevention education information provided.  The family member/significant other agrees to remove the items of safety concern listed above.  Wynn BankerHodnett, Lillian Tigges Hairston 11/18/2013, 8:58 AM

## 2013-11-18 NOTE — Progress Notes (Signed)
The focus of this group is to educate the patient on the purpose and policies of crisis stabilization and provide a format to answer questions about their admission.  The group details unit policies and expectations of patients while admitted.  Patient attended 0900 nurse education orientation group this morning.  Patient actively participated, appropriate affect, alert, appropriate insight and engagement.  Today patient will work on 3 goals for discharge.  

## 2013-11-18 NOTE — [facility] Group Notes (Signed)
[facility] LCSW Group Therapy      Feelings About Diagnosis 1:15 - 2:30 PM         11/18/2013  3:30 PM    Type of Therapy:  Group Therapy  Participation Level:  Minimal  Participation Quality:  Appropriate  Affect:  Drowsy  Cognitive: Appropriate  Insight:  Developing  Engagement in Therapy:  Developing  Modes of Intervention:  Discussion, Education, Exploration, Problem-Solving, Rapport Building, Support  Summary of Progress/Problems:  Patient was very drowsy and unable to stay awake to participate in discussion.    Bryan Huffman, Bryan Huffman 11/18/2013  3:30 PM

## 2013-11-18 NOTE — Progress Notes (Signed)
Recreation Therapy Notes  Animal-Assisted Activity/Therapy (AAA/T) Program Checklist/Progress Notes Patient Eligibility Criteria Checklist & Daily Group note for Rec Tx Intervention  Date: 02.10.2015 Time: 2:45pm Location: [address] Dayroom   AAA/T Program Assumption of Risk Form signed by Patient/ or Parent Legal Guardian yes  Patient is free of allergies or sever asthma yes  Patient reports no fear of animals yes  Patient reports no history of cruelty to animals yes   Patient understands his/her participation is voluntary yes  Patient washes hands before animal contact yes  Patient washes hands after animal contact yes  Behavioral Response: Appropriate   Education: Hand Washing, Appropriate Animal Interaction   Education Outcome: Acknowledges understanding   Clinical Observations/Feedback: Patient interacted appropriately with therapeutic dog team.   Makenzye Troutman L Alesa Echevarria, LRT/CTRS  Lysle Yero L 11/18/2013 4:37 PM 

## 2013-11-18 NOTE — Progress Notes (Signed)
Pt has been observed in the dayroom most of the evening watching TV and talking with peers.  Pt reports he is frustrated with his situation and the chronic back pain he suffers with.  He denies SI/HI/AV at this time.  Pt plans to return home at discharge and says he will file for disability as he cannot hold a job with the pain he has constantly.  He appears sad/depressed.  Pt makes his needs known to staff.  Support and encouragement offered.  Safety maintained with q15 minute checks.

## 2013-11-18 NOTE — Progress Notes (Signed)
Adult Psychoeducational Group Note  Date:  11/18/2013 Time: 2000  Group Topic/Focus:  Wrap-Up Group:   The focus of this group is to help patients review their daily goal of treatment and discuss progress on daily workbooks.  Participation Level:  Minimal  Participation Quality:  Appropriate  Affect:  Appropriate  Cognitive:  Appropriate  Insight: Appropriate  Engagement in Group:  Limited  Modes of Intervention:  Discussion, Education and Support  Additional Comments:    Humberto SealsWhitaker, Gerik Coberly Monique 11/18/2013, 11:29 PM

## 2013-11-18 NOTE — Progress Notes (Signed)
Patient ID: Bryan Huffman, male   DOB: Mar 08, 1971, 43 y.o.   MRN: [id number] [facility] MD Progress Note  11/18/2013 10:29 AM Bryan Huffman  MRN:  [id number] Subjective:   HPI: Bryan Huffman was admitted for major depressive disorder, alcohol dependence, anxiety disorder with suicidal ideation, after attempting to overdose on oxycodone and placing a gun to his head in an attempt to end his life. He has been under a lot of stress with his ex-wife and stated that "I don't want to live life without her and my son. He recently had back surgery and has been taking more pain medication than prescribed for the past week. He is requesting pain medication for a recent back surgery (verified, 4wks ago).    Assessment: Patient complaining of feeling tired, anxiety at 7/10 and depression at 4/10 which is low compared with yesterday he is stated 8/10. He has been participating in the groups and medication management. Patient has been ruminating about his ex wife stating that he hoped he would be back with her.  He is in agreement with treatment plan and medication regimen at this time and denies other physical and psychological concerns.    Diagnosis:   DSM5: Substance/Addictive Disorders:  Opioid Disorder - Moderate (304.00) Depressive Disorders:  Major Depressive Disorder - Severe (296.23) Total Time spent with patient: 30 minutes  Axis I: Major Depression, Recurrent severe and Substance Abuse Axis II: Deferred Axis III:  Past Medical History  Diagnosis Date  . Hypertension   . Back pain   . Insomnia   . Mental disorder   . Depression   . GERD (gastroesophageal reflux disease)   . Hemorrhoid   . Arthritis    Axis IV: other psychosocial or environmental problems and problems related to social environment Axis V: 41-50 serious symptoms  ADL's:  Intact  Sleep: Poor  Appetite:  Good  Suicidal Ideation:  Denies Homicidal Ideation:  Denies AEB (as evidenced by):  Psychiatric Specialty  Exam: Physical Exam  Review of Systems  Constitutional: Negative.   HENT: Negative.   Eyes: Negative.   Respiratory: Negative.   Cardiovascular: Negative.   Gastrointestinal: Negative.   Genitourinary: Negative.   Musculoskeletal: Negative.   Skin: Negative.   Neurological: Negative.   Endo/Heme/Allergies: Negative.   Psychiatric/Behavioral: Negative.     Blood pressure 136/89, pulse 89, temperature 97.2 F (36.2 C), temperature source Oral, resp. rate 20, height 5\' 6"  (1.676 m), weight 103.874 kg (229 lb), SpO2 99.00%.Body mass index is 36.98 kg/(m^2).  General Appearance: Casual  Eye Contact::  Good  Speech:  Clear and Coherent  Volume:  Normal  Mood:  Anxious and Depressed  Affect:  Depressed and Tearful  Thought Process:  Circumstantial and Coherent  Orientation:  Full (Time, Place, and Person)  Thought Content:  Rumination  Suicidal Thoughts:  No  Homicidal Thoughts:  No  Memory:  Immediate;   Fair Recent;   Fair Remote;   Fair  Judgement:  Fair  Insight:  Fair  Psychomotor Activity:  Normal  Concentration:  Good  Recall:  Good  Fund of Knowledge:Good  Language: Good  Akathisia:  NA  Handed:  Right  AIMS (if indicated):     Assets:  Communication Skills Desire for Improvement Resilience  Sleep:  Number of Hours: 3.5   Musculoskeletal: Strength & Muscle Tone: within normal limits Gait & Station: normal Patient leans: N/A  Current Medications: Current Facility-Administered Medications  Medication Dose Route Frequency Provider Last Rate Last Dose  .  acetaminophen (TYLENOL) tablet 650 mg  650 mg Oral Q6H PRN Kristeen MansFran E Hobson, NP      . alum & mag hydroxide-simeth (MAALOX/MYLANTA) 200-200-20 MG/5ML suspension 30 mL  30 mL Oral Q4H PRN Kristeen MansFran E Hobson, NP      . amitriptyline (ELAVIL) tablet 150 mg  150 mg Oral QHS Kristeen MansFran E Hobson, NP   150 mg at 11/17/13 2259  . cloNIDine (CATAPRES) tablet 0.1 mg  0.1 mg Oral TID Beau FannyJohn C Withrow, FNP   0.1 mg at 11/18/13 0840  .  docusate sodium (COLACE) capsule 100 mg  100 mg Oral BID Kristeen MansFran E Hobson, NP   100 mg at 11/17/13 0846  . FLUoxetine (PROZAC) capsule 20 mg  20 mg Oral Daily Beau FannyJohn C Withrow, FNP   20 mg at 11/18/13 0841  . gabapentin (NEURONTIN) capsule 300 mg  300 mg Oral TID Kristeen MansFran E Hobson, NP   300 mg at 11/18/13 0841  . hydrochlorothiazide (MICROZIDE) capsule 12.5 mg  12.5 mg Oral Daily Kristeen MansFran E Hobson, NP   12.5 mg at 11/18/13 0841  . hydrOXYzine (ATARAX/VISTARIL) tablet 25 mg  25 mg Oral Q6H PRN Beau FannyJohn C Withrow, FNP   25 mg at 11/18/13 1013  . lisinopril (PRINIVIL,ZESTRIL) tablet 10 mg  10 mg Oral Daily Kristeen MansFran E Hobson, NP   10 mg at 11/18/13 [id number]  . magnesium hydroxide (MILK OF MAGNESIA) suspension 30 mL  30 mL Oral Daily PRN Kristeen MansFran E Hobson, NP      . methocarbamol (ROBAXIN) tablet 750 mg  750 mg Oral Q6H PRN Beau FannyJohn C Withrow, FNP   750 mg at 11/18/13 1013  . metoprolol succinate (TOPROL-XL) 24 hr tablet 100 mg  100 mg Oral Daily Kristeen MansFran E Hobson, NP   100 mg at 11/18/13 [id number]  . nicotine (NICODERM CQ - dosed in mg/24 hours) patch 21 mg  21 mg Transdermal Q0600 Nehemiah SettleJanardhaha R Aynsley Fleet, MD   21 mg at 11/18/13 [id number]  . OLANZapine (ZYPREXA) tablet 10 mg  10 mg Oral QHS Kristeen MansFran E Hobson, NP   10 mg at 11/17/13 2300  . oxyCODONE-acetaminophen (PERCOCET/ROXICET) 5-325 MG per tablet 1 tablet  1 tablet Oral Q6H PRN Beau FannyJohn C Withrow, FNP   1 tablet at 11/18/13 [phone]  . pantoprazole (PROTONIX) EC tablet 40 mg  40 mg Oral Daily Kristeen MansFran E Hobson, NP   40 mg at 11/18/13 [id number]  . traZODone (DESYREL) tablet 100 mg  100 mg Oral QHS PRN Beau FannyJohn C Withrow, FNP   100 mg at 11/17/13 2300    Lab Results:  No results found for this or any previous visit (from the past 48 hour(s)).  Physical Findings: AIMS: Facial and Oral Movements Muscles of Facial Expression: None, normal Lips and Perioral Area: None, normal Jaw: None, normal Tongue: None, normal,Extremity Movements Upper (arms, wrists, hands, fingers): None, normal Lower (legs, knees, ankles,  toes): None, normal, Trunk Movements Neck, shoulders, hips: None, normal, Overall Severity Severity of abnormal movements (highest score from questions above): None, normal Incapacitation due to abnormal movements: None, normal Patient's awareness of abnormal movements (rate only patient's report): No Awareness, Dental Status Current problems with teeth and/or dentures?: No Does patient usually wear dentures?: No  CIWA:  CIWA-Ar Total: 2 COWS:  COWS Total Score: 3  Treatment Plan Summary: Daily contact with patient to assess and evaluate symptoms and progress in treatment Medication management  Plan: Review of chart, vital signs, medications, and notes.  1-Individual and group therapy  2-Medication management for depression and  anxiety: Medications reviewed with the patient and she stated no untoward effects. Increase Neurontin 400 mg TID for chronic back pain Increase  Fluoxetine 40 mg PO QD for depression Discontinue Zyprexa and taper of Elavil to 75 mg Qhs because of increased tiredness Continue Trazodone 100mg  Qhs for persistent insomnia -Modify Percocet 5-325 from q8h PRN to q6h PRN for pain 3-Coping skills for depression, anxiety  4-Continue crisis stabilization and management  5-Address health issues--monitoring vital signs, stable  6-Treatment plan in progress to prevent relapse of depression and anxiety 7. Disposition plans in progress and wishes to be discharged soon.  Medical Decision Making Problem Points:  Established problem, stable/improving (1), Review of last therapy session (1) and Review of psycho-social stressors (1) Data Points:  Review or order clinical lab tests (1) Review or order medicine tests (1) Review of medication regiment & side effects (2) Review of new medications or change in dosage (2)  I certify that inpatient services furnished can reasonably be expected to improve the patient's condition.   Bryan Huffman,JANARDHAHA R. 11/18/2013, 10:29 AM

## 2013-11-19 MED ORDER — IBUPROFEN 600 MG PO TABS
600.0000 mg | ORAL_TABLET | Freq: Four times a day (QID) | ORAL | Status: DC | PRN
  Administered 2013-11-20 – 2013-11-21 (×3): 600 mg via ORAL
  Filled 2013-11-19 (×3): qty 1

## 2013-11-19 MED ORDER — GABAPENTIN 300 MG PO CAPS
300.0000 mg | ORAL_CAPSULE | Freq: Three times a day (TID) | ORAL | Status: DC
  Administered 2013-11-19 – 2013-11-21 (×6): 300 mg via ORAL
  Filled 2013-11-19 (×5): qty 1
  Filled 2013-11-19 (×2): qty 42
  Filled 2013-11-19 (×2): qty 1
  Filled 2013-11-19: qty 42
  Filled 2013-11-19: qty 1

## 2013-11-19 NOTE — Progress Notes (Signed)
Patient was at the medication window right at the begining of the shift; "My pain pill is past due". His mood and affect depressed and irritable. He denied SI/HI and denied hallucinations. He rated his pain at 8 on the scale of 1-10 with 10 the worst. He requested for Oxycodone and Robaxin. A: Wriiter encouraged patient to take one then and the second one with his HS medications. Encouraged and supported patient. Administered the oxycodone. R: Patient receptive to encouragement and support. Received his medications without difficulty. Q 15 minute check continues as ordered to maintain safety.

## 2013-11-19 NOTE — Progress Notes (Signed)
Patient ID: Bryan Huffman, male   DOB: May 28, 1971, 43 y.o.   MRN: [id number] Patient ID: Bryan Huffman, male   DOB: May 28, 1971, 43 y.o.   MRN: [id number] [facility] MD Progress Note  11/19/2013 1:15 PM Bryan Huffman  MRN:  [id number] Subjective:   FIE:PPIRHPI:Bryan Huffman was admitted for major depressive disorder, alcohol dependence, anxiety disorder with suicidal ideation, after attempting to overdose on oxycodone and placing a gun to his head in an attempt to end his life. He has been under a lot of stress with his ex-wife and stated that "I don't want to live life without her and my son. He recently had back surgery and has been taking more pain medication than prescribed for the past week. He is requesting pain medication for a recent back surgery (verified, 4wks ago).    Assessment: Patient complaining of feeling tired, and sleepy. Patient reported she has been doing good so far as to and requested to make medication adjustment to reduce sedation. Patient rates anxiety at 2/10 and depression at 3/10 and pain threshold 7/10. Patient has been participating in the groups and medication management. Patient has been ruminating about his ex wife stating that he hoped he would be back with her.     Diagnosis:   DSM5: Substance/Addictive Disorders:  Opioid Disorder - Moderate (304.00) Depressive Disorders:  Major Depressive Disorder - Severe (296.23) Total Time spent with patient: 30 minutes  Axis I: Major Depression, Recurrent severe and Substance Abuse Axis II: Deferred Axis III:  Past Medical History  Diagnosis Date  . Hypertension   . Back pain   . Insomnia   . Mental disorder   . Depression   . GERD (gastroesophageal reflux disease)   . Hemorrhoid   . Arthritis    Axis IV: other psychosocial or environmental problems and problems related to social environment Axis V: 41-50 serious symptoms  ADL's:  Intact  Sleep: Poor  Appetite:  Good  Suicidal Ideation:  Denies Homicidal  Ideation:  Denies AEB (as evidenced by):  Psychiatric Specialty Exam: Physical Exam  Review of Systems  Constitutional: Negative.   HENT: Negative.   Eyes: Negative.   Respiratory: Negative.   Cardiovascular: Negative.   Gastrointestinal: Negative.   Genitourinary: Negative.   Musculoskeletal: Negative.   Skin: Negative.   Neurological: Negative.   Endo/Heme/Allergies: Negative.   Psychiatric/Behavioral: Negative.     Blood pressure 129/86, pulse 77, temperature 98 F (36.7 C), temperature source Oral, resp. rate 20, height 5\' 6"  (1.676 m), weight 103.874 kg (229 lb), SpO2 99.00%.Body mass index is 36.98 kg/(m^2).  General Appearance: Casual  Eye Contact::  Good  Speech:  Clear and Coherent  Volume:  Normal  Mood:  Anxious and Depressed  Affect:  Depressed and Tearful  Thought Process:  Circumstantial and Coherent  Orientation:  Full (Time, Place, and Person)  Thought Content:  Rumination  Suicidal Thoughts:  No  Homicidal Thoughts:  No  Memory:  Immediate;   Fair Recent;   Fair Remote;   Fair  Judgement:  Fair  Insight:  Fair  Psychomotor Activity:  Normal  Concentration:  Good  Recall:  Good  Fund of Knowledge:Good  Language: Good  Akathisia:  NA  Handed:  Right  AIMS (if indicated):     Assets:  Communication Skills Desire for Improvement Resilience  Sleep:  Number of Hours: 6.75   Musculoskeletal: Strength & Muscle Tone: within normal limits Gait & Station: normal Patient leans: N/A  Current Medications: Current  Facility-Administered Medications  Medication Dose Route Frequency Provider Last Rate Last Dose  . acetaminophen (TYLENOL) tablet 650 mg  650 mg Oral Q6H PRN Kristeen Mans, NP      . alum & mag hydroxide-simeth (MAALOX/MYLANTA) 200-200-20 MG/5ML suspension 30 mL  30 mL Oral Q4H PRN Kristeen Mans, NP      . cloNIDine (CATAPRES) tablet 0.1 mg  0.1 mg Oral TID Beau Fanny, FNP   0.1 mg at 11/19/13 1215  . docusate sodium (COLACE) capsule 100  mg  100 mg Oral BID Kristeen Mans, NP   100 mg at 11/17/13 0846  . FLUoxetine (PROZAC) capsule 40 mg  40 mg Oral Daily Nehemiah Settle, MD   40 mg at 11/19/13 3016  . gabapentin (NEURONTIN) capsule 300 mg  300 mg Oral TID Nehemiah Settle, MD      . hydrochlorothiazide (MICROZIDE) capsule 12.5 mg  12.5 mg Oral Daily Kristeen Mans, NP   12.5 mg at 11/19/13 0827  . hydrOXYzine (ATARAX/VISTARIL) tablet 25 mg  25 mg Oral Q6H PRN Beau Fanny, FNP   25 mg at 11/18/13 1013  . lisinopril (PRINIVIL,ZESTRIL) tablet 10 mg  10 mg Oral Daily Kristeen Mans, NP   10 mg at 11/19/13 0109  . magnesium hydroxide (MILK OF MAGNESIA) suspension 30 mL  30 mL Oral Daily PRN Kristeen Mans, NP      . methocarbamol (ROBAXIN) tablet 750 mg  750 mg Oral Q6H PRN Beau Fanny, FNP   750 mg at 11/19/13 1309  . metoprolol succinate (TOPROL-XL) 24 hr tablet 100 mg  100 mg Oral Daily Kristeen Mans, NP   100 mg at 11/19/13 0827  . nicotine (NICODERM CQ - dosed in mg/24 hours) patch 21 mg  21 mg Transdermal Q0600 Nehemiah Settle, MD   21 mg at 11/19/13 1152  . oxyCODONE-acetaminophen (PERCOCET/ROXICET) 5-325 MG per tablet 1 tablet  1 tablet Oral Q6H PRN Beau Fanny, FNP   1 tablet at 11/19/13 1311  . pantoprazole (PROTONIX) EC tablet 40 mg  40 mg Oral Daily Kristeen Mans, NP   40 mg at 11/19/13 0827  . traZODone (DESYREL) tablet 100 mg  100 mg Oral QHS PRN Beau Fanny, FNP   100 mg at 11/18/13 2215    Lab Results:  No results found for this or any previous visit (from the past 48 hour(s)).  Physical Findings: AIMS: Facial and Oral Movements Muscles of Facial Expression: None, normal Lips and Perioral Area: None, normal Jaw: None, normal Tongue: None, normal,Extremity Movements Upper (arms, wrists, hands, fingers): None, normal Lower (legs, knees, ankles, toes): None, normal, Trunk Movements Neck, shoulders, hips: None, normal, Overall Severity Severity of abnormal movements (highest  score from questions above): None, normal Incapacitation due to abnormal movements: None, normal Patient's awareness of abnormal movements (rate only patient's report): No Awareness, Dental Status Current problems with teeth and/or dentures?: No Does patient usually wear dentures?: No  CIWA:  CIWA-Ar Total: 2 COWS:  COWS Total Score: 2  Treatment Plan Summary: Daily contact with patient to assess and evaluate symptoms and progress in treatment Medication management  Plan: Review of chart, vital signs, medications, and notes.  1-Individual and group therapy  2-Medication management for depression and anxiety: Medications reviewed with the patient and she stated no untoward effects. Change Neurontin 300 mg TID for chronic back pain Continue  Fluoxetine 40 mg PO QD for depression Discontinue Elavil because of increased  tiredness Continue Trazodone 100mg  Qhs for persistent insomnia -Modify Percocet 5-325 from q8h PRN to q6h PRN for pain 3-Coping skills for depression, anxiety  4-Continue crisis stabilization and management  5-Address health issues--monitoring vital signs, stable  6-Treatment plan in progress to prevent relapse of depression and anxiety 7. Disposition plans in progress and wishes to be discharged soon.  Medical Decision Making Problem Points:  Established problem, stable/improving (1), Review of last therapy session (1) and Review of psycho-social stressors (1) Data Points:  Review or order clinical lab tests (1) Review or order medicine tests (1) Review of medication regiment & side effects (2) Review of new medications or change in dosage (2)  I certify that inpatient services furnished can reasonably be expected to improve the patient's condition.   Dorian Renfro,JANARDHAHA R. 11/19/2013, 1:15 PM

## 2013-11-19 NOTE — Progress Notes (Signed)
Adult Psychoeducational Group Note  Date:  11/19/2013 Time:  10:00am Group Topic/Focus:  Personal Choices and Values:   The focus of this group is to help patients assess and explore the importance of values in their lives, how their values affect their decisions, how they express their values and what opposes their expression.  Participation Level:  Did Not Attend  Participation Quality:    Affect:  Cognitive:    Insight:   Engagement in Group:   Modes of Intervention:    Additional Comments: Pt did not attend  Pryor CuriaGarner, Grayling Schranz D 11/19/2013, 2:09 PM

## 2013-11-19 NOTE — [facility] Group Notes (Signed)
[facility] LCSW Group Therapy  Emotional Regulation 1:15 - 2: 30 PM        11/19/2013  3:43 PM   Type of Therapy:  Group Therapy  Participation Level:  Appropriate  Participation Quality:  Appropriate  Affect:  Appropriate  Cognitive:  Attentive Appropriate  Insight:  Developing/Improving Engaged  Engagement in Therapy:  Developing/Improving Engaged  Modes of Intervention:  Discussion Exploration Problem-Solving Supportive  Summary of Progress/Problems:  Group topic was emotional regulations.  Patient participated in the discussion and was able to identify an emotion that needed to regulated.  Patient shared he deals with a lot of guilt.  He stated he did not feel comfortable talking in the group about things he is guilty about. Patient was able to identify approprite coping skills.  Wynn BankerHodnett, Hallel Denherder Hairston 11/19/2013 3:43 PM

## 2013-11-19 NOTE — Progress Notes (Signed)
Adult Psychoeducational Group Note  Date:  11/19/2013 Time:  9:21 PM  Group Topic/Focus:  Wrap-Up Group:   The focus of this group is to help patients review their daily goal of treatment and discuss progress on daily workbooks.  Participation Level:  Active  Participation Quality:  Appropriate  Affect:  Appropriate  Cognitive:  Appropriate  Insight: Appropriate  Engagement in Group:  Engaged  Modes of Intervention:  Discussion  Additional Comments:  The patient expressed that today was his best day . The patient also said that he had the opportunity to talk his problem out.  Octavio Mannshigpen, Marlow Hendrie Lee 11/19/2013, 9:21 PM

## 2013-11-19 NOTE — Progress Notes (Signed)
D: Patient appropriate and cooperative with staff. Patient presents with flat, depressed affect and mood. He reported on the self inventory sheet that he's sleeping well, appetite and ability to pay attention are good and energy level is low. Patient rated depression and feelings of hopelessness "2". He did not attend morning groups; patient has been lying in bed the majority of the day thus far. Patient is adhering to current medication regimen.  A: Support and encouragement provided to patient. Scheduled medications administered per MD orders. Maintain Q15 minute checks for safety.   R: Patient receptive. Denies SI/HI and AVH. Patient remains safe.

## 2013-11-19 NOTE — [facility] Group Notes (Signed)
[facility]   11/19/2013 9:58 AM  Participation Quality:  Did not attend group.  Marnie Fazzino, Joesph JulyQuylle Hairston

## 2013-11-19 NOTE — Progress Notes (Signed)
Patient ID: Bryan SpiresJody A Debold, male   DOB: 1970-10-26, 43 y.o.   [facility] [id number] [facility]  11/19/2013 6:49 PM Bryan SpiresJody A Calzadilla  MRN:  [id number] Subjective:   HPI: Bryan SpiresJody A Bergren is an 43 y.o. male who was brought into the ED by EMS after attempting to overdose on oxycodone and placing a gun to his head in an attempt to end his life. Pt reported that he has been under a lot of stress with his ex-wife and stated that "I don't want to live life without her and my son. Pt reported that he had a few drinks and has been fussing with his ex-wife today and "everything just came to a head today." Pt also reported that he recently had back surgery and has been taking more pain medication than prescribed for the past week. During admission assessment, pt rates anxiety at 10/10 and depression at 10/10. Pt denies SI, HI, and AVH, does contract for safety at this time. Pt reports only intermittent drinking but a significant amount of abuse of oxycodone, stating that he took "about 4 times the prescribed amount in a short time". Pt reports that he was "just at a low point and there was no event before I wanted to die, I just got to that point over a long period of time". Pt is requesting pain medication for a recent back surgery (verified, 4wks ago). Addressed. Pt was informed that he would only be on low dose infrequent narcotic pain medications given his past troubles with these medications.   Assessment: During today's assessment, pt rates anxiety at 5/10 and depression at 5/10. Pt states that he has had some good visits with family stopping by and that this has helped him a lot with his depression and anxiety. Pt denies SI, HI, and AVH, contracts for safety.  Pt is in agreement with treatment plan and medication regimen at this time and denies other physical and psychological concerns.    Diagnosis:   DSM5: Substance/Addictive Disorders:  Opioid Disorder - Moderate (304.00) Depressive Disorders:   Major Depressive Disorder - Severe (296.23) Total Time spent with patient: 30 minutes  Axis I: Major Depression, Recurrent severe and Substance Abuse Axis II: Deferred Axis III:  Past Medical History  Diagnosis Date  . Hypertension   . Back pain   . Insomnia   . Mental disorder   . Depression   . GERD (gastroesophageal reflux disease)   . Hemorrhoid   . Arthritis    Axis IV: other psychosocial or environmental problems and problems related to social environment Axis V: 41-50 serious symptoms  ADL's:  Intact  Sleep: Poor  Appetite:  Good  Suicidal Ideation:  Denies Homicidal Ideation:  Denies AEB (as evidenced by):  Psychiatric Specialty Exam: Physical Exam  Review of Systems  Constitutional: Negative.   HENT: Negative.   Eyes: Negative.   Respiratory: Negative.   Cardiovascular: Negative.   Gastrointestinal: Negative.   Genitourinary: Negative.   Musculoskeletal: Negative.   Skin: Negative.   Neurological: Negative.   Endo/Heme/Allergies: Negative.   Psychiatric/Behavioral: Negative.     Blood pressure 129/84, pulse 77, temperature 98 F (36.7 C), temperature source Oral, resp. rate 20, height 5\' 6"  (1.676 m), weight 103.874 kg (229 lb), SpO2 99.00%.Body mass index is 36.98 kg/(m^2).  General Appearance: Casual  Eye Contact::  Good  Speech:  Clear and Coherent  Volume:  Normal  Mood:  Anxious and Depressed  Affect:  Depressed and Tearful  Thought Process:  Circumstantial and Coherent  Orientation:  Full (Time, Place, and Person)  Thought Content:  Rumination  Suicidal Thoughts:  No  Homicidal Thoughts:  No  Memory:  Immediate;   Fair Recent;   Fair Remote;   Fair  Judgement:  Fair  Insight:  Fair  Psychomotor Activity:  Normal  Concentration:  Good  Recall:  Good  Fund of Knowledge:Good  Language: Good  Akathisia:  NA  Handed:  Right  AIMS (if indicated):     Assets:  Communication Skills Desire for Improvement Resilience  Sleep:  Number of  Hours: 6.75   Musculoskeletal: Strength & Muscle Tone: within normal limits Gait & Station: normal Patient leans: N/A  Current Medications: Current Facility-Administered Medications  Medication Dose Route Frequency Provider Last Rate Last Dose  . acetaminophen (TYLENOL) tablet 650 mg  650 mg Oral Q6H PRN Kristeen Mans, NP      . alum & mag hydroxide-simeth (MAALOX/MYLANTA) 200-200-20 MG/5ML suspension 30 mL  30 mL Oral Q4H PRN Kristeen Mans, NP      . cloNIDine (CATAPRES) tablet 0.1 mg  0.1 mg Oral TID Beau Fanny, FNP   0.1 mg at 11/19/13 1724  . docusate sodium (COLACE) capsule 100 mg  100 mg Oral BID Kristeen Mans, NP   100 mg at 11/17/13 0846  . FLUoxetine (PROZAC) capsule 40 mg  40 mg Oral Daily Nehemiah Settle, MD   40 mg at 11/19/13 1610  . gabapentin (NEURONTIN) capsule 300 mg  300 mg Oral TID Nehemiah Settle, MD   300 mg at 11/19/13 1724  . hydrochlorothiazide (MICROZIDE) capsule 12.5 mg  12.5 mg Oral Daily Kristeen Mans, NP   12.5 mg at 11/19/13 0827  . hydrOXYzine (ATARAX/VISTARIL) tablet 25 mg  25 mg Oral Q6H PRN Beau Fanny, FNP   25 mg at 11/18/13 1013  . lisinopril (PRINIVIL,ZESTRIL) tablet 10 mg  10 mg Oral Daily Kristeen Mans, NP   10 mg at 11/19/13 9604  . magnesium hydroxide (MILK OF MAGNESIA) suspension 30 mL  30 mL Oral Daily PRN Kristeen Mans, NP      . methocarbamol (ROBAXIN) tablet 750 mg  750 mg Oral Q6H PRN Beau Fanny, FNP   750 mg at 11/19/13 1309  . metoprolol succinate (TOPROL-XL) 24 hr tablet 100 mg  100 mg Oral Daily Kristeen Mans, NP   100 mg at 11/19/13 0827  . nicotine (NICODERM CQ - dosed in mg/24 hours) patch 21 mg  21 mg Transdermal Q0600 Nehemiah Settle, MD   21 mg at 11/19/13 1152  . oxyCODONE-acetaminophen (PERCOCET/ROXICET) 5-325 MG per tablet 1 tablet  1 tablet Oral Q6H PRN Beau Fanny, FNP   1 tablet at 11/19/13 1311  . pantoprazole (PROTONIX) EC tablet 40 mg  40 mg Oral Daily Kristeen Mans, NP   40 mg at  11/19/13 0827  . traZODone (DESYREL) tablet 100 mg  100 mg Oral QHS PRN Beau Fanny, FNP   100 mg at 11/18/13 2215    Lab Results:  No results found for this or any previous visit (from the past 48 hour(s)).  Physical Findings: AIMS: Facial and Oral Movements Muscles of Facial Expression: None, normal Lips and Perioral Area: None, normal Jaw: None, normal Tongue: None, normal,Extremity Movements Upper (arms, wrists, hands, fingers): None, normal Lower (legs, knees, ankles, toes): None, normal, Trunk Movements Neck, shoulders, hips: None, normal, Overall Severity Severity of abnormal movements (highest score from  questions above): None, normal Incapacitation due to abnormal movements: None, normal Patient's awareness of abnormal movements (rate only patient's report): No Awareness, Dental Status Current problems with teeth and/or dentures?: No Does patient usually wear dentures?: No  CIWA:  CIWA-Ar Total: 2 COWS:  COWS Total Score: 2  Treatment Plan Summary: Daily contact with patient to assess and evaluate symptoms and progress in treatment Medication management  Plan: Review of chart, vital signs, medications, and notes.  1-Individual and group therapy  2-Medication management for depression and anxiety: Medications reviewed with the patient and she stated no untoward effects. -Continue Trazodone from 50mg  to 100mg  for persistent insomnia -Continue Percocet 5-325 from q8h PRN to q6h PRN for pain -Add Ibuprofen 600mg  PO q6h PRN for back pain and inflammation 3-Coping skills for depression, anxiety  4-Continue crisis stabilization and management  5-Address health issues--monitoring vital signs, stable  6-Treatment plan in progress to prevent relapse of depression and anxiety  Medical Decision Making Problem Points:  Established problem, stable/improving (1), Review of last therapy session (1) and Review of psycho-social stressors (1) Data Points:  Review or order clinical  lab tests (1) Review or order medicine tests (1) Review of medication regiment & side effects (2) Review of new medications or change in dosage (2)  I certify that inpatient services furnished can reasonably be expected to improve the patient's condition.   Beau Fanny, FNP-BC 11/19/2013, 6:49 PM  Reviewed the information documented and agree with the treatment plan.  Eilam Shrewsbury,JANARDHAHA R. 11/20/2013 10:00 AM

## 2013-11-19 NOTE — Progress Notes (Signed)
Pt reports he is still depressed/anxious.  He denies SI/HI/AV, but he is still feeling hopeless about his marriage.  He feels the meds are not enough to control his pain level.  He is still receiving his medications as ordered, and makes his needs known to staff.  Pt is able to joke a little with Clinical research associatewriter during assessment.  Pt plans to return home at discharge, but is unsure when his discharge will be.  Support and encouragement offered.  Safety maintained with q15 minute checks.

## 2013-11-20 MED ORDER — TRAZODONE HCL 150 MG PO TABS
150.0000 mg | ORAL_TABLET | Freq: Every evening | ORAL | Status: DC | PRN
  Administered 2013-11-20: 150 mg via ORAL
  Filled 2013-11-20: qty 1

## 2013-11-20 NOTE — Progress Notes (Addendum)
Patient continues to ask for pain medication, has been seen talking in hallway with peers, laughing.  Patient gets out of his chair after talking with peers and requests more pain medicaiton.  D:  Patient's self inventory sheet, patient has poor sleep, good appetite, low energy level, improving attention span.  Rated depression, hopeless and anxiety 2.  Denied withdrawals.  Denied SI.  Contracts for safety.   Continues to have back pain.  Worst pain #10, zero pain goal.  [facility]  Has medicaid for medications after discharge. A:  Medications administered per MD orders.  Emotional support and encouragement given patient. R:  Denied SI and HI.  Denied A/V hallucinations.  Will continue to monitor patient for safety with 15 minute checks. Safety maintained. Patient given several ice packs today for his back pain. 1720  Patient stated his pain level is now at #4.

## 2013-11-20 NOTE — Progress Notes (Addendum)
Recreation Therapy Notes  Date: 02.11.2015 Time: 2:45pm Location: [address] Day[address]   Group Topic: Anger Management  Goal Area(s) Addresses:  Patient will identify body's physical reaction to anger.  Patient will identify positive coping mechanisms to deal with anger.  Patient will select one coping mechanism of choice to use post d/c when experiencing anger.   Behavioral Response: Appropriate  Intervention: Art  Activity: Patients were divided into groups, one member was selected to be traced by LRT. Patients were asked to identify reactions to anger, using outline they were asked to place reactions on the corresponding section of the body. Patients were then asked to identify positive coping skills to use when experiencing anger, using the outline patients were asked to place the coping skills on the area of the body used to complete that coping skill.     Education: Anger Management, Discharge Planning, Coping Skills  Education Outcome: Acknowledges understanding   Clinical Observations/Feedback: Patient actively engaged in group session, working well with her team to identify reactions to anger, as well as coping skills. Patient made no contributions to group discussion, but appeared to actively listen as he maintained appropriate eye contact with speaker.     Marykay Lexenise L Hetal Proano, LRT/CTRS   Jearl KlinefelterBlanchfield, Eder Macek L 11/20/2013 9:37 AM

## 2013-11-20 NOTE — Progress Notes (Addendum)
Patient ID: Bryan Huffman, male   DOB: Feb 25, 1971, 43 y.o.   MRN: [id number] [facility] MD Progress Note  11/20/2013 7:01 PM Bryan Huffman  MRN:  [id number] Subjective:   HPI: Bryan Huffman is an 43 y.o. male who was brought into the ED by EMS after attempting to overdose on oxycodone and placing a gun to his head in an attempt to end his life. Pt reported that he has been under a lot of stress with his ex-wife and stated that "I don't want to live life without her and my son. Pt reported that he had a few drinks and has been fussing with his ex-wife today and "everything just came to a head today." Pt also reported that he recently had back surgery and has been taking more pain medication than prescribed for the past week. During admission assessment, pt rates anxiety at 10/10 and depression at 10/10. Pt denies SI, HI, and AVH, does contract for safety at this time. Pt reports only intermittent drinking but a significant amount of abuse of oxycodone, stating that he took "about 4 times the prescribed amount in a short time". Pt reports that he was "just at a low point and there was no event before I wanted to die, I just got to that point over a long period of time". Pt is requesting pain medication for a recent back surgery (verified, 4wks ago). Addressed. Pt was informed that he would only be on low dose infrequent narcotic pain medications given his past troubles with these medications.   Assessment: During today's assessment, pt rates anxiety at 1/10 and depression at 1/10. Pt states that his depression and anxiety have much improved. Pt may be minimizing symptoms, but his demeanor is pleasant, calm, cooperative, and he appears to be cheerful. Pt states that his back problems are also improving greatly and that his pain is being managed well even with non-narcotic medications. Pt is anticipating discharge tomorrow with outpatient followup. Affirms good support system.  Pt denies SI, HI, and AVH,  contracts for safety.  Pt is in agreement with treatment plan and medication regimen at this time and denies other physical and psychological concerns.    Diagnosis:   DSM5: Substance/Addictive Disorders:  Opioid Disorder - Moderate (304.00) Depressive Disorders:  Major Depressive Disorder - Severe (296.23) Total Time spent with patient: 30 minutes  Axis I: Major Depression, Recurrent severe and Substance Abuse Axis II: Deferred Axis III:  Past Medical History  Diagnosis Date  . Hypertension   . Back pain   . Insomnia   . Mental disorder   . Depression   . GERD (gastroesophageal reflux disease)   . Hemorrhoid   . Arthritis    Axis IV: other psychosocial or environmental problems and problems related to social environment Axis V: 41-50 serious symptoms  ADL's:  Intact  Sleep: Poor  Appetite:  Good  Suicidal Ideation:  Denies Homicidal Ideation:  Denies AEB (as evidenced by):  Psychiatric Specialty Exam: Physical Exam  Review of Systems  Constitutional: Negative.   HENT: Negative.   Eyes: Negative.   Respiratory: Negative.   Cardiovascular: Negative.   Gastrointestinal: Negative.   Genitourinary: Negative.   Musculoskeletal: Negative.   Skin: Negative.   Neurological: Negative.   Endo/Heme/Allergies: Negative.   Psychiatric/Behavioral: Negative.     Blood pressure 129/83, pulse 73, temperature 97.4 F (36.3 C), temperature source Oral, resp. rate 18, height 5\' 6"  (1.676 m), weight 103.874 kg (229 lb), SpO2 99.00%.Body mass index is  36.98 kg/(m^2).  General Appearance: Casual  Eye Contact::  Good  Speech:  Clear and Coherent  Volume:  Normal  Mood:  Euthymic  Affect:  Appropriate  Thought Process:  Circumstantial and Coherent  Orientation:  Full (Time, Place, and Person)  Thought Content:  WDL  Suicidal Thoughts:  No  Homicidal Thoughts:  No  Memory:  Immediate;   Fair Recent;   Fair Remote;   Fair  Judgement:  Fair  Insight:  Fair  Psychomotor  Activity:  Normal  Concentration:  Good  Recall:  Good  Fund of Knowledge:Good  Language: Good  Akathisia:  NA  Handed:  Right  AIMS (if indicated):     Assets:  Communication Skills Desire for Improvement Resilience  Sleep:  Number of Hours: 6.25   Musculoskeletal: Strength & Muscle Tone: within normal limits Gait & Station: normal Patient leans: N/A  Current Medications: Current Facility-Administered Medications  Medication Dose Route Frequency Provider Last Rate Last Dose  . acetaminophen (TYLENOL) tablet 650 mg  650 mg Oral Q6H PRN Kristeen MansFran E Hobson, NP      . alum & mag hydroxide-simeth (MAALOX/MYLANTA) 200-200-20 MG/5ML suspension 30 mL  30 mL Oral Q4H PRN Kristeen MansFran E Hobson, NP      . cloNIDine (CATAPRES) tablet 0.1 mg  0.1 mg Oral TID Beau FannyJohn C Withrow, FNP   0.1 mg at 11/20/13 1721  . docusate sodium (COLACE) capsule 100 mg  100 mg Oral BID Kristeen MansFran E Hobson, NP   100 mg at 11/17/13 0846  . FLUoxetine (PROZAC) capsule 40 mg  40 mg Oral Daily Nehemiah SettleJanardhaha R Hadiya Spoerl, MD   40 mg at 11/20/13 0759  . gabapentin (NEURONTIN) capsule 300 mg  300 mg Oral TID Nehemiah SettleJanardhaha R Jenissa Tyrell, MD   300 mg at 11/20/13 1722  . hydrochlorothiazide (MICROZIDE) capsule 12.5 mg  12.5 mg Oral Daily Kristeen MansFran E Hobson, NP   12.5 mg at 11/20/13 0800  . hydrOXYzine (ATARAX/VISTARIL) tablet 25 mg  25 mg Oral Q6H PRN Beau FannyJohn C Withrow, FNP   25 mg at 11/20/13 0810  . ibuprofen (ADVIL,MOTRIN) tablet 600 mg  600 mg Oral Q6H PRN Beau FannyJohn C Withrow, FNP   600 mg at 11/20/13 0809  . lisinopril (PRINIVIL,ZESTRIL) tablet 10 mg  10 mg Oral Daily Kristeen MansFran E Hobson, NP   10 mg at 11/20/13 0801  . magnesium hydroxide (MILK OF MAGNESIA) suspension 30 mL  30 mL Oral Daily PRN Kristeen MansFran E Hobson, NP      . methocarbamol (ROBAXIN) tablet 750 mg  750 mg Oral Q6H PRN Beau FannyJohn C Withrow, FNP   750 mg at 11/20/13 1314  . metoprolol succinate (TOPROL-XL) 24 hr tablet 100 mg  100 mg Oral Daily Kristeen MansFran E Hobson, NP   100 mg at 11/20/13 0801  . nicotine (NICODERM CQ  - dosed in mg/24 hours) patch 21 mg  21 mg Transdermal Q0600 Nehemiah SettleJanardhaha R Oriana Horiuchi, MD   21 mg at 11/20/13 1124  . oxyCODONE-acetaminophen (PERCOCET/ROXICET) 5-325 MG per tablet 1 tablet  1 tablet Oral Q6H PRN Beau FannyJohn C Withrow, FNP   1 tablet at 11/20/13 1313  . pantoprazole (PROTONIX) EC tablet 40 mg  40 mg Oral Daily Kristeen MansFran E Hobson, NP   40 mg at 11/20/13 0801  . traZODone (DESYREL) tablet 150 mg  150 mg Oral QHS PRN Beau FannyJohn C Withrow, FNP        Lab Results:  No results found for this or any previous visit (from the past 48 hour(s)).  Physical Findings:  AIMS: Facial and Oral Movements Muscles of Facial Expression: None, normal Lips and Perioral Area: None, normal Jaw: None, normal Tongue: None, normal,Extremity Movements Upper (arms, wrists, hands, fingers): None, normal Lower (legs, knees, ankles, toes): None, normal, Trunk Movements Neck, shoulders, hips: None, normal, Overall Severity Severity of abnormal movements (highest score from questions above): None, normal Incapacitation due to abnormal movements: None, normal Patient's awareness of abnormal movements (rate only patient's report): No Awareness, Dental Status Current problems with teeth and/or dentures?: No Does patient usually wear dentures?: No  CIWA:  CIWA-Ar Total: 2 COWS:  COWS Total Score: 2  Treatment Plan Summary: Daily contact with patient to assess and evaluate symptoms and progress in treatment Medication management  Plan: Review of chart, vital signs, medications, and notes.  1-Individual and group therapy  2-Medication management for depression and anxiety: Medications reviewed with the patient and she stated no untoward effects. -Continue Trazodone from 50mg  to 100mg  for persistent insomnia -Continue Percocet 5-325 from q8h PRN to q6h PRN for pain -Continue Ibuprofen 600mg  PO q6h PRN for back pain and inflammation -Increase Trazodone from 100mg  to 150mg  qhs for insomnia  3-Coping skills for depression,  anxiety  4-Continue crisis stabilization and management  5-Address health issues--monitoring vital signs, stable  6-Treatment plan in progress to prevent relapse of depression and anxiety  Medical Decision Making Problem Points:  Established problem, stable/improving (1), Review of last therapy session (1) and Review of psycho-social stressors (1) Data Points:  Review or order clinical lab tests (1) Review or order medicine tests (1) Review of medication regiment & side effects (2) Review of new medications or change in dosage (2)  I certify that inpatient services furnished can reasonably be expected to improve the patient's condition.   Beau Fanny, FNP-BC 11/20/2013, 7:01 PM  Reviewed the information documented and agree with the treatment plan.  Brian Kocourek,JANARDHAHA R. 11/21/2013 11:49 AM

## 2013-11-20 NOTE — Progress Notes (Signed)
Patient ID: Bryan SpiresJody A Williard, male   DOB: 10-19-1970, 43 y.o.   MRN: [id number]  [facility] 9 A.M.  The focus of this group is to educate the patient on the purpose and policies of crisis stabilization and provide a format to answer questions about their admission.  The group details unit policies and expectations of patients while admitted.  Patient did not attend group.

## 2013-11-20 NOTE — [facility] Group Notes (Signed)
[facility] LCSW Group Therapy  11/20/2013  1:15 PM   Type of Therapy:  Group Therapy  Participation Level:  Active  Participation Quality:  Attentive, Sharing and Supportive  Affect:  Depressed and Flat  Cognitive:  Alert and Oriented  Insight:  Developing/Improving and Engaged  Engagement in Therapy:  Developing/Improving, Engaged and Supportive  Modes of Intervention:  Activity, Clarification, Confrontation, Discussion, Education, Exploration, Limit-setting, Orientation, Problem-solving, Rapport Building, Reality Testing, Socialization and Support  Summary of Progress/Problems: Patient was attentive and engaged with speaker from [employer].  Patient was attentive to speaker while they shared their story of dealing with mental health and overcoming it.  Patient expressed interest in their programs and services and received information on their agency.  Patient processed ways they can relate to the speaker.     Herbie Lehrmann Horton, LCSW 11/20/2013 1:39 PM    

## 2013-11-20 NOTE — Progress Notes (Signed)
Recreation Therapy Notes  Animal-Assisted Activity/Therapy (AAA/T) Program Checklist/Progress Notes Patient Eligibility Criteria Checklist & Daily Group note for Rec Tx Intervention  Date: 02.12.2015 Time: 2:45pm Location: [address]    AAA/T Program Assumption of Risk Form signed by Patient/ or Parent Legal Guardian yes  Patient is free of allergies or sever asthma yes  Patient reports no fear of animals yes  Patient reports no history of cruelty to animals yes   Patient understands his/her participation is voluntary yes  Patient washes hands before animal contact yes  Patient washes hands after animal contact yes  Behavioral Response: Appropriate   Education: Hand Washing, Appropriate Animal Interaction   Education Outcome: Acknowledges understanding   Clinical Observations/Feedback: Patient interacted appropriately with group members, LRT and peers.   Marykay Lexenise L Liba Hulsey, LRT/CTRS   Klever Twyford L 11/20/2013 5:25 PM

## 2013-11-21 MED ORDER — TRAZODONE HCL 150 MG PO TABS: 150.0000 mg | ORAL_TABLET | Freq: Every evening | ORAL | Status: DC | PRN

## 2013-11-21 MED ORDER — GABAPENTIN 300 MG PO CAPS: 300.0000 mg | ORAL_CAPSULE | Freq: Three times a day (TID) | ORAL | Status: DC

## 2013-11-21 MED ORDER — METHOCARBAMOL 750 MG PO TABS: 750.0000 mg | ORAL_TABLET | Freq: Four times a day (QID) | ORAL | Status: DC | PRN

## 2013-11-21 MED ORDER — METOPROLOL SUCCINATE ER 100 MG PO TB24: 100.0000 mg | ORAL_TABLET | Freq: Every day | ORAL | Status: DC

## 2013-11-21 MED ORDER — FLUOXETINE HCL 40 MG PO CAPS: 40.0000 mg | ORAL_CAPSULE | Freq: Every day | ORAL | Status: DC

## 2013-11-21 MED ORDER — HYDROCHLOROTHIAZIDE 12.5 MG PO CAPS: 12.5000 mg | ORAL_CAPSULE | Freq: Every day | ORAL | Status: DC

## 2013-11-21 MED ORDER — CLONIDINE HCL 0.1 MG PO TABS: 0.1000 mg | ORAL_TABLET | Freq: Three times a day (TID) | ORAL | Status: DC

## 2013-11-21 MED ORDER — LISINOPRIL 10 MG PO TABS: 10.0000 mg | ORAL_TABLET | Freq: Every day | ORAL | Status: DC

## 2013-11-21 MED ORDER — HYDROXYZINE HCL 25 MG PO TABS: 25.0000 mg | ORAL_TABLET | Freq: Four times a day (QID) | ORAL | Status: DC | PRN

## 2013-11-21 NOTE — Progress Notes (Signed)
Patient requesting for medication round the clock. He reported that he would be going home to his parent tomorrow. He is interacting well with other patients and attending group. He denied SI/Hi and denied hallucinations. Q 15 minute check continues as ordered to maintain safety.

## 2013-11-21 NOTE — Progress Notes (Signed)
[facility] Discharge [facility] :  Will you be returning to the same living situation after discharge: Yes,  home At discharge, do you have transportation home?:Yes,  no barriers Do you have the ability to pay for your medications:Yes,  no barriers  Release of information consent forms completed and in the chart;  Patient's signature needed at discharge.  Patient to Follow up at: Follow-up Information   Follow up with [facility] On 11/24/2013. ([facility] Monday, November 24, 2013 or any other weekday between 8AM-12:00 or 1:00 - 3:00 PM for medication management and counseling)    Contact information:   [address] [place], [place]   [id number]  [phone]      Patient denies SI/HI:   Yes,  reports he is feeling much better    Safety Planning and Suicide Prevention discussed:  Yes,  see SI note.  Raye SorrowCoble, Jakeob Tullis N 11/21/2013, 10:18 AM

## 2013-11-21 NOTE — Progress Notes (Signed)
D) Pt being discharged to home accompanied by a family member.  Mood and affect are appropriate. Denies SI and HI.  A) All medications and after care program explained to Pt. All belongings returned and Pt given samples to be taken until his gets to his appointment. Given support, reassurance and praise. Encouragement given. R) Denies SI and HI, delusions and hallucinations.
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
## 2013-11-21 NOTE — [facility] Suicide Risk Assessment (Signed)
   Demographic Factors:  Male, Caucasian, Low socioeconomic status and Unemployed  Total Time spent with patient: 30 minutes  Psychiatric Specialty Exam: Physical Exam  Review of Systems  Musculoskeletal: Positive for back pain.  All other systems reviewed and are negative.    Blood pressure 129/83, pulse 73, temperature 97.4 F (36.3 C), temperature source Oral, resp. rate 18, height 5\' 6"  (1.676 m), weight 103.874 kg (229 lb), SpO2 99.00%.Body mass index is 36.98 kg/(m^2).  General Appearance: Casual and Fairly Groomed  Eye Contact::  Good  Speech:  Clear and Coherent  Volume:  Normal  Mood:  Depressed  Affect:  Appropriate and Congruent  Thought Process:  Coherent  Orientation:  Full (Time, Place, and Person)  Thought Content:  WDL  Suicidal Thoughts:  No  Homicidal Thoughts:  No  Memory:  Immediate;   Good  Judgement:  Intact  Insight:  Fair  Psychomotor Activity:  Normal  Concentration:  Good  Recall:  Good  Fund of Knowledge:Good  Language: Good  Akathisia:  NA  Handed:  Right  AIMS (if indicated):     Assets:  Communication Skills Desire for Improvement Financial Resources/Insurance Housing Intimacy Leisure Time Physical Health Resilience Social Support Transportation  Sleep:  Number of Hours: 6    Musculoskeletal: Strength & Muscle Tone: within normal limits Gait & Station: normal Patient leans: N/A   Mental Status Per Nursing Assessment::   On Admission:     Current Mental Status by Physician: NA  Loss Factors: Decrease in vocational status and Financial problems/change in socioeconomic status  Historical Factors: Prior suicide attempts, Family history of mental illness or substance abuse and Impulsivity  Risk Reduction Factors:   Sense of responsibility to family, Religious beliefs about death, Living with another person, especially a relative, Positive social support, Positive therapeutic relationship and Positive coping skills or  problem solving skills  Continued Clinical Symptoms:  Depression:   Recent sense of peace/wellbeing Chronic Pain Previous Psychiatric Diagnoses and Treatments Medical Diagnoses and Treatments/Surgeries  Cognitive Features That Contribute To Risk:  Polarized thinking    Suicide Risk:  Minimal: No identifiable suicidal ideation.  Patients presenting with no risk factors but with morbid ruminations; may be classified as minimal risk based on the severity of the depressive symptoms  Discharge Diagnoses:   AXIS I:  Major Depression, Recurrent severe AXIS II:  Deferred AXIS III:   Past Medical History  Diagnosis Date  . Hypertension   . Back pain   . Insomnia   . Mental disorder   . Depression   . GERD (gastroesophageal reflux disease)   . Hemorrhoid   . Arthritis    AXIS IV:  occupational problems, other psychosocial or environmental problems, problems related to social environment and problems with primary support group AXIS V:  61-70 mild symptoms  Plan Of Care/Follow-up recommendations:  Activity:  As tolerated Diet:  Regular  Is patient on multiple antipsychotic therapies at discharge:  No   Has Patient had three or more failed trials of antipsychotic monotherapy by history:  No  Recommended Plan for Multiple Antipsychotic Therapies: NA    Kyasia Steuck,JANARDHAHA R. 11/21/2013, 9:12 AM

## 2013-11-26 NOTE — Progress Notes (Signed)
Patient Discharge Instructions:  After Visit Summary (AVS):   Faxed to:  11/26/13 Discharge Summary Note:   Faxed to:  11/26/13 Psychiatric Admission Assessment Note:   Faxed to:  11/26/13 Suicide Risk Assessment - Discharge Assessment:   Faxed to:  11/26/13 Faxed/Sent to the Next Level Care provider:  11/26/13 [facility] @ [phone]  Jerelene ReddenSheena E [address], 11/26/2013, 4:00 PM

## 2013-12-11 ENCOUNTER — Ambulatory Visit: Payer: Medicaid Other | Attending: Orthopedic Surgery | Admitting: Physical Therapy

## 2013-12-11 ENCOUNTER — Ambulatory Visit: Payer: Medicaid Other | Admitting: Physical Therapy

## 2013-12-11 DIAGNOSIS — R5381 Other malaise: Secondary | ICD-10-CM | POA: Insufficient documentation

## 2013-12-11 DIAGNOSIS — IMO0001 Reserved for inherently not codable concepts without codable children: Secondary | ICD-10-CM | POA: Insufficient documentation

## 2013-12-11 DIAGNOSIS — M545 Low back pain, unspecified: Secondary | ICD-10-CM | POA: Insufficient documentation

## 2013-12-11 DIAGNOSIS — R293 Abnormal posture: Secondary | ICD-10-CM | POA: Insufficient documentation

## 2013-12-11 DIAGNOSIS — M6250 Muscle wasting and atrophy, not elsewhere classified, unspecified site: Secondary | ICD-10-CM | POA: Insufficient documentation

## 2013-12-11 DIAGNOSIS — M25559 Pain in unspecified hip: Secondary | ICD-10-CM | POA: Insufficient documentation

## 2013-12-30 ENCOUNTER — Other Ambulatory Visit: Payer: Self-pay | Admitting: Orthopedic Surgery

## 2013-12-30 ENCOUNTER — Encounter: Payer: Self-pay | Admitting: Rehabilitation

## 2013-12-30 DIAGNOSIS — Z9889 Other specified postprocedural states: Secondary | ICD-10-CM

## 2014-01-05 ENCOUNTER — Ambulatory Visit
Admission: RE | Admit: 2014-01-05 | Discharge: 2014-01-05 | Disposition: A | Discharge status: Home / Self Care | Payer: Medicaid Other | Source: Ambulatory Visit | Attending: Orthopedic Surgery | Admitting: Orthopedic Surgery

## 2014-01-05 DIAGNOSIS — Z9889 Other specified postprocedural states: Secondary | ICD-10-CM

## 2014-01-06 ENCOUNTER — Encounter: Payer: Self-pay | Admitting: Rehabilitation

## 2014-01-06 ENCOUNTER — Encounter (HOSPITAL_COMMUNITY): Payer: Self-pay | Admitting: Emergency Medicine

## 2014-01-06 ENCOUNTER — Emergency Department (HOSPITAL_COMMUNITY)
Admission: EM | Admit: 2014-01-06 | Discharge: 2014-01-06 | Disposition: A | Discharge status: Home / Self Care | Payer: Medicaid Other | Attending: Emergency Medicine | Admitting: Emergency Medicine

## 2014-01-06 DIAGNOSIS — I10 Essential (primary) hypertension: Secondary | ICD-10-CM | POA: Insufficient documentation

## 2014-01-06 DIAGNOSIS — G4700 Insomnia, unspecified: Secondary | ICD-10-CM | POA: Insufficient documentation

## 2014-01-06 DIAGNOSIS — K219 Gastro-esophageal reflux disease without esophagitis: Secondary | ICD-10-CM | POA: Insufficient documentation

## 2014-01-06 DIAGNOSIS — T07XXXA Unspecified multiple injuries, initial encounter: Secondary | ICD-10-CM | POA: Insufficient documentation

## 2014-01-06 DIAGNOSIS — M129 Arthropathy, unspecified: Secondary | ICD-10-CM | POA: Insufficient documentation

## 2014-01-06 DIAGNOSIS — F329 Major depressive disorder, single episode, unspecified: Secondary | ICD-10-CM | POA: Insufficient documentation

## 2014-01-06 DIAGNOSIS — Z79899 Other long term (current) drug therapy: Secondary | ICD-10-CM | POA: Insufficient documentation

## 2014-01-06 DIAGNOSIS — Z9889 Other specified postprocedural states: Secondary | ICD-10-CM | POA: Insufficient documentation

## 2014-01-06 DIAGNOSIS — F3289 Other specified depressive episodes: Secondary | ICD-10-CM | POA: Insufficient documentation

## 2014-01-06 DIAGNOSIS — F17200 Nicotine dependence, unspecified, uncomplicated: Secondary | ICD-10-CM | POA: Insufficient documentation

## 2014-01-06 MED ORDER — OXYCODONE-ACETAMINOPHEN 5-325 MG PO TABS: 2.0000 | ORAL_TABLET | ORAL | Status: DC | PRN

## 2014-01-06 MED ORDER — OXYCODONE-ACETAMINOPHEN 5-325 MG PO TABS
1.0000 | ORAL_TABLET | Freq: Once | ORAL | Status: AC
  Administered 2014-01-06: 1 via ORAL
  Filled 2014-01-06: qty 1

## 2014-01-06 NOTE — ED Notes (Signed)
Bed: WA09 Expected date:  Expected time:  Means of arrival:  Comments: EMS-back pain-possible screw loose
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
## 2014-01-06 NOTE — Discharge Instructions (Signed)
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
## 2014-01-13 ENCOUNTER — Encounter: Payer: Self-pay | Admitting: Rehabilitation

## 2014-01-21 ENCOUNTER — Encounter (HOSPITAL_COMMUNITY): Payer: Self-pay | Admitting: Pharmacy Technician

## 2014-01-26 ENCOUNTER — Other Ambulatory Visit (HOSPITAL_COMMUNITY): Payer: Self-pay | Admitting: *Deleted

## 2014-01-26 NOTE — Pre-Procedure Instructions (Signed)
Bryan GambleJody A Huffman  01/26/2014   Your procedure is scheduled on:  Thursday, January 29, 2014 at 1:00 PM.   [facility] at 11:00 AM.   Call this number if you have problems the morning of surgery: [phone]   Remember:   Do not eat food or drink liquids after midnight Wednesday, 01/28/14.   Take these medicines the morning of surgery with A SIP OF WATER: gabapentin (NEURONTIN), metoprolol succinate (TOPROL-XL), omeprazole (PRILOSEC), methocarbamol (ROBAXIN) - if needed.    Do not wear jewelry.  Do not wear lotions, powders, or cologne. You may wear deodorant.  Men may shave face and neck.  Do not bring valuables to the hospital.  [facility] not responsible                  for any belongings or valuables.               Contacts, dentures or bridgework may not be worn into surgery.  Leave suitcase in the car. After surgery it may be brought to your room.  For patients admitted to the hospital, discharge time is determined by your                treatment team.              Special Instructions: [place] - Preparing for Surgery  Before surgery, you can play an important role.  Because skin is not sterile, your skin needs to be as free of germs as possible.  You can reduce the number of germs on you skin by washing with CHG (chlorahexidine gluconate) soap before surgery.  CHG is an antiseptic cleaner which kills germs and bonds with the skin to continue killing germs even after washing.  Please DO NOT use if you have an allergy to CHG or antibacterial soaps.  If your skin becomes reddened/irritated stop using the CHG and inform your nurse when you arrive at Short Stay.  Do not shave (including legs and underarms) for at least 48 hours prior to the first CHG shower.  You may shave your face.  Please follow these instructions carefully:   1.  Shower with CHG Soap the night before surgery and the                                morning of  Surgery.  2.  If you choose to wash your hair, wash your hair first as usual with your       normal shampoo.  3.  After you shampoo, rinse your hair and body thoroughly to remove the                      Shampoo.  4.  Use CHG as you would any other liquid soap.  You can apply chg directly       to the skin and wash gently with scrungie or a clean washcloth.  5.  Apply the CHG Soap to your body ONLY FROM THE NECK DOWN.        Do not use on open wounds or open sores.  Avoid contact with your eyes, ears, mouth and genitals (private parts).  Wash genitals (private parts) with your normal soap.  6.  Wash thoroughly, paying special attention to the area where your surgery        will be performed.  7.  Thoroughly rinse your body with warm water from the neck down.  8.  DO NOT shower/wash with your normal soap after using and rinsing off       the CHG Soap.  9.  Pat yourself dry with a clean towel.            10.  Wear clean pajamas.            11.  Place clean sheets on your bed the night of your first shower and do not        sleep with pets.  Day of Surgery  Do not apply any lotions the morning of surgery.  Please wear clean clothes to the hospital/surgery center.      Please read over the following fact sheets that you were given: Pain Booklet, Coughing and Deep Breathing, Blood Transfusion Information, MRSA Information and Surgical Site Infection Prevention

## 2014-01-27 ENCOUNTER — Encounter (HOSPITAL_COMMUNITY)
Admission: RE | Admit: 2014-01-27 | Discharge: 2014-01-27 | Disposition: A | Discharge status: Home / Self Care | Payer: Medicaid Other | Source: Ambulatory Visit | Attending: Orthopedic Surgery | Admitting: Orthopedic Surgery

## 2014-01-27 ENCOUNTER — Encounter (HOSPITAL_COMMUNITY): Payer: Self-pay

## 2014-01-27 LAB — BASIC METABOLIC PANEL
BUN: 16 mg/dL (ref 6–23)
CO2: 21 meq/L (ref 19–32)
Calcium: 10.1 mg/dL (ref 8.4–10.5)
Chloride: 99 mEq/L (ref 96–112)
Creatinine, Ser: 0.73 mg/dL (ref 0.50–1.35)
GFR calc Af Amer: >90 mL/min (ref >90)
GFR calc non Af Amer: >90 mL/min (ref >90)
GLUCOSE: 99 mg/dL (ref 70–99)
Potassium: 5.1 mEq/L (ref 3.7–5.3)
SODIUM: 137 meq/L (ref 137–147)

## 2014-01-27 LAB — TYPE AND SCREEN
ABO/RH(D): A POS
Antibody Screen: NEGATIVE

## 2014-01-27 LAB — CBC
HCT: 43.4 % (ref 39.0–52.0)
HEMOGLOBIN: 14.7 g/dL (ref 13.0–17.0)
MCH: 30.2 pg (ref 26.0–34.0)
MCHC: 33.9 g/dL (ref 30.0–36.0)
MCV: 89.1 fL (ref 78.0–100.0)
Platelets: 335 10*3/uL (ref 150–400)
RBC: 4.87 MIL/uL (ref 4.22–5.81)
RDW: 14.4 % (ref 11.5–15.5)
WBC: 12 10*3/uL — ABNORMAL HIGH (ref 4.0–10.5)

## 2014-01-27 LAB — SURGICAL PCR SCREEN
MRSA, PCR: NEGATIVE
Staphylococcus aureus: NEGATIVE

## 2014-01-28 MED ORDER — CEFAZOLIN SODIUM-DEXTROSE 2-3 GM-% IV SOLR
2.0000 g | INTRAVENOUS | Status: AC
  Administered 2014-01-29: 2 g via INTRAVENOUS
  Filled 2014-01-28: qty 50

## 2014-01-28 MED ORDER — ACETAMINOPHEN 10 MG/ML IV SOLN
1000.0000 mg | Freq: Four times a day (QID) | INTRAVENOUS | Status: DC
  Filled 2014-01-28: qty 100

## 2014-01-28 MED ORDER — DEXAMETHASONE SODIUM PHOSPHATE 4 MG/ML IJ SOLN
4.0000 mg | Freq: Once | INTRAMUSCULAR | Status: AC
  Administered 2014-01-29: 10 mg via INTRAVENOUS
  Filled 2014-01-28: qty 1

## 2014-01-29 ENCOUNTER — Encounter (HOSPITAL_COMMUNITY): Payer: Self-pay | Admitting: *Deleted

## 2014-01-29 ENCOUNTER — Inpatient Hospital Stay (HOSPITAL_COMMUNITY): Payer: Medicaid Other | Admitting: Anesthesiology

## 2014-01-29 ENCOUNTER — Encounter (HOSPITAL_COMMUNITY)
Admission: RE | Disposition: A | Discharge status: Home / Self Care | Payer: Self-pay | Source: Ambulatory Visit | Attending: Orthopedic Surgery

## 2014-01-29 ENCOUNTER — Encounter (HOSPITAL_COMMUNITY): Payer: Medicaid Other | Admitting: Anesthesiology

## 2014-01-29 ENCOUNTER — Inpatient Hospital Stay (HOSPITAL_COMMUNITY): Payer: Medicaid Other

## 2014-01-29 ENCOUNTER — Inpatient Hospital Stay (HOSPITAL_COMMUNITY)
Admission: RE | Admit: 2014-01-29 | Discharge: 2014-01-30 | DRG: 460 | Disposition: A | Discharge status: Home / Self Care | Payer: Medicaid Other | Source: Ambulatory Visit | Attending: Orthopedic Surgery | Admitting: Orthopedic Surgery

## 2014-01-29 DIAGNOSIS — F17200 Nicotine dependence, unspecified, uncomplicated: Secondary | ICD-10-CM | POA: Diagnosis present

## 2014-01-29 DIAGNOSIS — M5137 Other intervertebral disc degeneration, lumbosacral region: Principal | ICD-10-CM | POA: Diagnosis present

## 2014-01-29 DIAGNOSIS — Z79899 Other long term (current) drug therapy: Secondary | ICD-10-CM

## 2014-01-29 DIAGNOSIS — Z01812 Encounter for preprocedural laboratory examination: Secondary | ICD-10-CM

## 2014-01-29 DIAGNOSIS — M51379 Other intervertebral disc degeneration, lumbosacral region without mention of lumbar back pain or lower extremity pain: Principal | ICD-10-CM | POA: Diagnosis present

## 2014-01-29 DIAGNOSIS — M961 Postlaminectomy syndrome, not elsewhere classified: Secondary | ICD-10-CM | POA: Diagnosis present

## 2014-01-29 DIAGNOSIS — M549 Dorsalgia, unspecified: Secondary | ICD-10-CM | POA: Diagnosis present

## 2014-01-29 DIAGNOSIS — Z981 Arthrodesis status: Secondary | ICD-10-CM

## 2014-01-29 HISTORY — PX: ANTERIOR LAT LUMBAR FUSION: SHX1168

## 2014-01-29 SURGERY — ANTERIOR LATERAL LUMBAR FUSION 1 LEVEL
Anesthesia: General | Site: Back

## 2014-01-29 MED ORDER — MIDAZOLAM HCL 5 MG/5ML IJ SOLN
INTRAMUSCULAR | Status: DC | PRN
  Administered 2014-01-29: 2 mg via INTRAVENOUS

## 2014-01-29 MED ORDER — ACETAMINOPHEN 10 MG/ML IV SOLN
1000.0000 mg | Freq: Once | INTRAVENOUS | Status: AC
  Administered 2014-01-29: 1000 mg via INTRAVENOUS

## 2014-01-29 MED ORDER — OXYCODONE HCL 5 MG PO TABS
5.0000 mg | ORAL_TABLET | Freq: Once | ORAL | Status: AC | PRN
  Administered 2014-01-29: 5 mg via ORAL

## 2014-01-29 MED ORDER — METHOCARBAMOL 100 MG/ML IJ SOLN
500.0000 mg | Freq: Four times a day (QID) | INTRAVENOUS | Status: DC | PRN
  Filled 2014-01-29: qty 5

## 2014-01-29 MED ORDER — OXYCODONE HCL 5 MG PO TABS
10.0000 mg | ORAL_TABLET | ORAL | Status: DC | PRN
  Administered 2014-01-29 – 2014-01-30 (×3): 10 mg via ORAL
  Filled 2014-01-29 (×4): qty 2

## 2014-01-29 MED ORDER — PROPOFOL 10 MG/ML IV BOLUS
INTRAVENOUS | Status: AC
  Filled 2014-01-29: qty 20

## 2014-01-29 MED ORDER — MIDAZOLAM HCL 2 MG/2ML IJ SOLN
INTRAMUSCULAR | Status: AC
  Filled 2014-01-29: qty 2

## 2014-01-29 MED ORDER — LACTATED RINGERS IV SOLN
INTRAVENOUS | Status: DC | PRN
  Administered 2014-01-29 (×2): via INTRAVENOUS

## 2014-01-29 MED ORDER — HYDROMORPHONE HCL PF 1 MG/ML IJ SOLN
INTRAMUSCULAR | Status: AC
  Filled 2014-01-29: qty 1

## 2014-01-29 MED ORDER — NALOXONE HCL 0.4 MG/ML IJ SOLN: 0.4000 mg | INTRAMUSCULAR | Status: DC | PRN

## 2014-01-29 MED ORDER — LIDOCAINE HCL (CARDIAC) 20 MG/ML IV SOLN
INTRAVENOUS | Status: DC | PRN
  Administered 2014-01-29: 40 mg via INTRAVENOUS

## 2014-01-29 MED ORDER — ARTIFICIAL TEARS OP OINT
TOPICAL_OINTMENT | OPHTHALMIC | Status: DC | PRN
  Administered 2014-01-29: 1 via OPHTHALMIC

## 2014-01-29 MED ORDER — LACTATED RINGERS IV SOLN
INTRAVENOUS | Status: DC
  Administered 2014-01-29: 12:00:00 via INTRAVENOUS

## 2014-01-29 MED ORDER — FENTANYL CITRATE 0.05 MG/ML IJ SOLN
INTRAMUSCULAR | Status: AC
  Filled 2014-01-29: qty 5

## 2014-01-29 MED ORDER — DEXAMETHASONE 4 MG PO TABS
4.0000 mg | ORAL_TABLET | Freq: Four times a day (QID) | ORAL | Status: DC
  Administered 2014-01-30 (×2): 4 mg via ORAL
  Filled 2014-01-29 (×7): qty 1

## 2014-01-29 MED ORDER — DEXAMETHASONE SODIUM PHOSPHATE 10 MG/ML IJ SOLN
INTRAMUSCULAR | Status: AC
  Filled 2014-01-29: qty 1

## 2014-01-29 MED ORDER — THROMBIN 20000 UNITS EX SOLR
CUTANEOUS | Status: DC | PRN
  Administered 2014-01-29: 15:00:00 via TOPICAL

## 2014-01-29 MED ORDER — METHOCARBAMOL 500 MG PO TABS
ORAL_TABLET | ORAL | Status: AC
  Filled 2014-01-29: qty 1

## 2014-01-29 MED ORDER — ONDANSETRON HCL 4 MG/2ML IJ SOLN: 4.0000 mg | Freq: Four times a day (QID) | INTRAMUSCULAR | Status: DC | PRN

## 2014-01-29 MED ORDER — SODIUM CHLORIDE 0.9 % IJ SOLN: 3.0000 mL | INTRAMUSCULAR | Status: DC | PRN

## 2014-01-29 MED ORDER — EPHEDRINE SULFATE 50 MG/ML IJ SOLN
INTRAMUSCULAR | Status: DC | PRN
  Administered 2014-01-29: 5 mg via INTRAVENOUS
  Administered 2014-01-29: 10 mg via INTRAVENOUS
  Administered 2014-01-29 (×2): 5 mg via INTRAVENOUS

## 2014-01-29 MED ORDER — PROPOFOL 10 MG/ML IV BOLUS
INTRAVENOUS | Status: DC | PRN
  Administered 2014-01-29 (×4): 20 mg via INTRAVENOUS
  Administered 2014-01-29: 200 mg via INTRAVENOUS
  Administered 2014-01-29: 20 mg via INTRAVENOUS

## 2014-01-29 MED ORDER — DEXAMETHASONE SODIUM PHOSPHATE 4 MG/ML IJ SOLN
4.0000 mg | Freq: Four times a day (QID) | INTRAMUSCULAR | Status: DC
  Administered 2014-01-29: 4 mg via INTRAVENOUS
  Filled 2014-01-29 (×7): qty 1

## 2014-01-29 MED ORDER — ONDANSETRON HCL 4 MG/2ML IJ SOLN
INTRAMUSCULAR | Status: DC | PRN
  Administered 2014-01-29: 4 mg via INTRAVENOUS

## 2014-01-29 MED ORDER — GABAPENTIN 300 MG PO CAPS
300.0000 mg | ORAL_CAPSULE | Freq: Three times a day (TID) | ORAL | Status: DC
  Administered 2014-01-29 – 2014-01-30 (×2): 300 mg via ORAL
  Filled 2014-01-29 (×4): qty 1

## 2014-01-29 MED ORDER — SODIUM CHLORIDE 0.9 % IV SOLN: 250.0000 mL | INTRAVENOUS | Status: DC

## 2014-01-29 MED ORDER — ONDANSETRON HCL 4 MG/2ML IJ SOLN: 4.0000 mg | INTRAMUSCULAR | Status: DC | PRN

## 2014-01-29 MED ORDER — METHOCARBAMOL 500 MG PO TABS
500.0000 mg | ORAL_TABLET | Freq: Four times a day (QID) | ORAL | Status: DC | PRN
  Administered 2014-01-29 – 2014-01-30 (×4): 500 mg via ORAL
  Filled 2014-01-29 (×3): qty 1

## 2014-01-29 MED ORDER — DIPHENHYDRAMINE HCL 12.5 MG/5ML PO ELIX
12.5000 mg | ORAL_SOLUTION | Freq: Four times a day (QID) | ORAL | Status: DC | PRN
  Administered 2014-01-30: 12.5 mg via ORAL
  Filled 2014-01-29: qty 10

## 2014-01-29 MED ORDER — OXYCODONE HCL 5 MG PO TABS
ORAL_TABLET | ORAL | Status: AC
Start: 2014-01-29 — End: 2014-01-30
  Filled 2014-01-29: qty 1

## 2014-01-29 MED ORDER — DIPHENHYDRAMINE HCL 50 MG/ML IJ SOLN
12.5000 mg | Freq: Four times a day (QID) | INTRAMUSCULAR | Status: DC | PRN
  Administered 2014-01-29 (×2): 12.5 mg via INTRAVENOUS
  Filled 2014-01-29: qty 1

## 2014-01-29 MED ORDER — SODIUM CHLORIDE 0.9 % IJ SOLN: 3.0000 mL | Freq: Two times a day (BID) | INTRAMUSCULAR | Status: DC

## 2014-01-29 MED ORDER — METOPROLOL SUCCINATE ER 100 MG PO TB24
100.0000 mg | ORAL_TABLET | Freq: Every day | ORAL | Status: DC
  Administered 2014-01-30: 100 mg via ORAL
  Filled 2014-01-29: qty 1

## 2014-01-29 MED ORDER — GLYCOPYRROLATE 0.2 MG/ML IJ SOLN
INTRAMUSCULAR | Status: DC | PRN
  Administered 2014-01-29: 0.2 mg via INTRAVENOUS

## 2014-01-29 MED ORDER — FENTANYL CITRATE 0.05 MG/ML IJ SOLN
INTRAMUSCULAR | Status: DC | PRN
  Administered 2014-01-29 (×2): 50 ug via INTRAVENOUS
  Administered 2014-01-29: 200 ug via INTRAVENOUS
  Administered 2014-01-29 (×2): 50 ug via INTRAVENOUS
  Administered 2014-01-29: 100 ug via INTRAVENOUS
  Administered 2014-01-29 (×5): 50 ug via INTRAVENOUS

## 2014-01-29 MED ORDER — 0.9 % SODIUM CHLORIDE (POUR BTL) OPTIME
TOPICAL | Status: DC | PRN
  Administered 2014-01-29: 1000 mL

## 2014-01-29 MED ORDER — DIPHENHYDRAMINE HCL 50 MG/ML IJ SOLN
INTRAMUSCULAR | Status: AC
  Filled 2014-01-29: qty 1

## 2014-01-29 MED ORDER — ONDANSETRON HCL 4 MG/2ML IJ SOLN
INTRAMUSCULAR | Status: AC
  Filled 2014-01-29: qty 2

## 2014-01-29 MED ORDER — LACTATED RINGERS IV SOLN
INTRAVENOUS | Status: DC
  Administered 2014-01-29: 85 mL/h via INTRAVENOUS

## 2014-01-29 MED ORDER — HYDROMORPHONE HCL PF 1 MG/ML IJ SOLN
0.2500 mg | INTRAMUSCULAR | Status: DC | PRN
  Administered 2014-01-29 (×2): 0.25 mg via INTRAVENOUS
  Administered 2014-01-29: 0.5 mg via INTRAVENOUS
  Administered 2014-01-29 (×2): 0.25 mg via INTRAVENOUS
  Administered 2014-01-29: 0.5 mg via INTRAVENOUS

## 2014-01-29 MED ORDER — PHENOL 1.4 % MT LIQD: 1.0000 | OROMUCOSAL | Status: DC | PRN

## 2014-01-29 MED ORDER — SUCCINYLCHOLINE CHLORIDE 20 MG/ML IJ SOLN
INTRAMUSCULAR | Status: DC | PRN
  Administered 2014-01-29: 120 mg via INTRAVENOUS

## 2014-01-29 MED ORDER — BUPIVACAINE-EPINEPHRINE (PF) 0.25% -1:200000 IJ SOLN
INTRAMUSCULAR | Status: AC
  Filled 2014-01-29: qty 30

## 2014-01-29 MED ORDER — CEFAZOLIN SODIUM 1-5 GM-% IV SOLN
1.0000 g | Freq: Three times a day (TID) | INTRAVENOUS | Status: AC
  Administered 2014-01-29 – 2014-01-30 (×2): 1 g via INTRAVENOUS
  Filled 2014-01-29 (×2): qty 50

## 2014-01-29 MED ORDER — MORPHINE SULFATE (PF) 1 MG/ML IV SOLN
INTRAVENOUS | Status: DC
  Administered 2014-01-29: 19:00:00 via INTRAVENOUS
  Administered 2014-01-29: 19.5 mg via INTRAVENOUS
  Administered 2014-01-29: via INTRAVENOUS
  Administered 2014-01-29: 29.26 mg via INTRAVENOUS
  Administered 2014-01-30: 26.06 mg via INTRAVENOUS
  Administered 2014-01-30: 04:00:00 via INTRAVENOUS
  Filled 2014-01-29 (×3): qty 25

## 2014-01-29 MED ORDER — THROMBIN 20000 UNITS EX SOLR
CUTANEOUS | Status: AC
  Filled 2014-01-29: qty 20000

## 2014-01-29 MED ORDER — LISINOPRIL 10 MG PO TABS
10.0000 mg | ORAL_TABLET | Freq: Every day | ORAL | Status: DC
  Administered 2014-01-30: 10 mg via ORAL
  Filled 2014-01-29: qty 1

## 2014-01-29 MED ORDER — MENTHOL 3 MG MT LOZG: 1.0000 | LOZENGE | OROMUCOSAL | Status: DC | PRN

## 2014-01-29 MED ORDER — MORPHINE SULFATE (PF) 1 MG/ML IV SOLN
INTRAVENOUS | Status: AC
  Administered 2014-01-29: 16:00:00
  Filled 2014-01-29: qty 25

## 2014-01-29 MED ORDER — BUPIVACAINE-EPINEPHRINE 0.25% -1:200000 IJ SOLN
INTRAMUSCULAR | Status: DC | PRN
  Administered 2014-01-29: 10 mL

## 2014-01-29 MED ORDER — SODIUM CHLORIDE 0.9 % IJ SOLN: 9.0000 mL | INTRAMUSCULAR | Status: DC | PRN

## 2014-01-29 MED ORDER — ACETAMINOPHEN 10 MG/ML IV SOLN
1000.0000 mg | Freq: Four times a day (QID) | INTRAVENOUS | Status: DC
  Administered 2014-01-29 – 2014-01-30 (×2): 1000 mg via INTRAVENOUS
  Filled 2014-01-29 (×4): qty 100

## 2014-01-29 MED ORDER — HEMOSTATIC AGENTS (NO CHARGE) OPTIME
TOPICAL | Status: DC | PRN
  Administered 2014-01-29: 1 via TOPICAL

## 2014-01-29 MED ORDER — HYDROCHLOROTHIAZIDE 12.5 MG PO CAPS
12.5000 mg | ORAL_CAPSULE | Freq: Every day | ORAL | Status: DC
  Administered 2014-01-30: 12.5 mg via ORAL
  Filled 2014-01-29: qty 1

## 2014-01-29 MED ORDER — LIDOCAINE HCL (CARDIAC) 20 MG/ML IV SOLN
INTRAVENOUS | Status: AC
  Filled 2014-01-29: qty 5

## 2014-01-29 MED ORDER — OXYCODONE HCL 5 MG/5ML PO SOLN: 5.0000 mg | Freq: Once | ORAL | Status: AC | PRN

## 2014-01-29 SURGICAL SUPPLY — 70 items
BLADE SURG 10 STRL SS (BLADE) ×5 IMPLANT
BLADE SURG CLIPPER 3M 9600 (MISCELLANEOUS) ×2 IMPLANT
BLADE SURG ROTATE 9660 (MISCELLANEOUS) IMPLANT
BONE MATRIX OSTEOCEL PRO MED (Bone Implant) ×2 IMPLANT
CLOSURE STERI-STRIP 1/2X4 (GAUZE/BANDAGES/DRESSINGS) ×2
CLOSURE WOUND 1/2 X4 (GAUZE/BANDAGES/DRESSINGS)
CLSR STERI-STRIP ANTIMIC 1/2X4 (GAUZE/BANDAGES/DRESSINGS) ×2 IMPLANT
CORDS BIPOLAR (ELECTRODE) ×3 IMPLANT
COVER SURGICAL LIGHT HANDLE (MISCELLANEOUS) ×3 IMPLANT
DRAPE C-ARM 42X72 X-RAY (DRAPES) ×3 IMPLANT
DRAPE C-ARMOR (DRAPES) ×2 IMPLANT
DRAPE INCISE IOBAN 66X45 STRL (DRAPES) ×3 IMPLANT
DRAPE ORTHO SPLIT 77X108 STRL (DRAPES) ×3
DRAPE POUCH INSTRU U-SHP 10X18 (DRAPES) ×3 IMPLANT
DRAPE SURG ORHT 6 SPLT 77X108 (DRAPES) ×1 IMPLANT
DRAPE U-SHAPE 47X51 STRL (DRAPES) ×6 IMPLANT
DRSG MEPILEX BORDER 4X4 (GAUZE/BANDAGES/DRESSINGS) ×2 IMPLANT
DRSG MEPILEX BORDER 4X8 (GAUZE/BANDAGES/DRESSINGS) ×3 IMPLANT
DURAPREP 26ML APPLICATOR (WOUND CARE) ×3 IMPLANT
ELECT BLADE 4.0 EZ CLEAN MEGAD (MISCELLANEOUS) ×3
ELECT REM PT RETURN 9FT ADLT (ELECTROSURGICAL) ×3
ELECTRODE BLDE 4.0 EZ CLN MEGD (MISCELLANEOUS) ×1 IMPLANT
ELECTRODE REM PT RTRN 9FT ADLT (ELECTROSURGICAL) ×1 IMPLANT
GAUZE SPONGE 4X4 16PLY XRAY LF (GAUZE/BANDAGES/DRESSINGS) ×3 IMPLANT
GLOVE BIOGEL PI IND STRL 8 (GLOVE) ×1 IMPLANT
GLOVE BIOGEL PI IND STRL 8.5 (GLOVE) ×1 IMPLANT
GLOVE BIOGEL PI INDICATOR 8 (GLOVE) ×2
GLOVE BIOGEL PI INDICATOR 8.5 (GLOVE) ×2
GLOVE ECLIPSE 8.5 STRL (GLOVE) ×3 IMPLANT
GLOVE ORTHO TXT STRL SZ7.5 (GLOVE) ×3 IMPLANT
GOWN STRL REUS W/ TWL XL LVL3 (GOWN DISPOSABLE) ×2 IMPLANT
GOWN STRL REUS W/TWL 2XL LVL3 (GOWN DISPOSABLE) ×6 IMPLANT
GOWN STRL REUS W/TWL XL LVL3 (GOWN DISPOSABLE) ×6
IMPLANT COROENT XL 10X18X50 (Orthopedic Implant) ×2 IMPLANT
KIT BASIN OR (CUSTOM PROCEDURE TRAY) ×3 IMPLANT
KIT DILATOR XLIF 5 (KITS) IMPLANT
KIT MAXCESS (KITS) ×2 IMPLANT
KIT NDL NVM5 EMG ELECT (KITS) IMPLANT
KIT NEEDLE NVM5 EMG ELECT (KITS) ×1 IMPLANT
KIT NEEDLE NVM5 EMG ELECTRODE (KITS) ×2
KIT ROOM TURNOVER OR (KITS) ×3 IMPLANT
KIT XLIF (KITS) ×2
NDL SPNL 18GX3.5 QUINCKE PK (NEEDLE) ×1 IMPLANT
NEEDLE 22X1 1/2 (OR ONLY) (NEEDLE) ×3 IMPLANT
NEEDLE SPNL 18GX3.5 QUINCKE PK (NEEDLE) ×3 IMPLANT
NS IRRIG 1000ML POUR BTL (IV SOLUTION) ×3 IMPLANT
PACK LAMINECTOMY ORTHO (CUSTOM PROCEDURE TRAY) ×3 IMPLANT
PACK UNIVERSAL I (CUSTOM PROCEDURE TRAY) ×3 IMPLANT
PAD ARMBOARD 7.5X6 YLW CONV (MISCELLANEOUS) ×6 IMPLANT
PLATE DECADE XLIP 2H SZ12 (Plate) ×2 IMPLANT
SCREW DECADE 5.5X50 (Screw) ×4 IMPLANT
SPONGE LAP 4X18 X RAY DECT (DISPOSABLE) ×3 IMPLANT
SPONGE SURGIFOAM ABS GEL 100 (HEMOSTASIS) ×3 IMPLANT
STAPLER VISISTAT 35W (STAPLE) ×3 IMPLANT
STRIP CLOSURE SKIN 1/2X4 (GAUZE/BANDAGES/DRESSINGS) IMPLANT
SURGIFLO TRUKIT (HEMOSTASIS) IMPLANT
SUT MON AB 3-0 SH 27 (SUTURE) ×6
SUT MON AB 3-0 SH27 (SUTURE) ×2 IMPLANT
SUT VIC AB 1 CT1 27 (SUTURE) ×3
SUT VIC AB 1 CT1 27XBRD ANBCTR (SUTURE) ×2 IMPLANT
SUT VIC AB 1 CTX 36 (SUTURE)
SUT VIC AB 1 CTX36XBRD ANBCTR (SUTURE) ×2 IMPLANT
SUT VIC AB 2-0 CT1 18 (SUTURE) ×4 IMPLANT
SYR BULB IRRIGATION 50ML (SYRINGE) ×3 IMPLANT
SYR CONTROL 10ML LL (SYRINGE) ×3 IMPLANT
TAPE CLOTH 4X10 WHT NS (GAUZE/BANDAGES/DRESSINGS) ×6 IMPLANT
TOWEL OR 17X24 6PK STRL BLUE (TOWEL DISPOSABLE) ×3 IMPLANT
TOWEL OR 17X26 10 PK STRL BLUE (TOWEL DISPOSABLE) ×3 IMPLANT
TRAY FOLEY CATH 14FRSI W/METER (CATHETERS) ×3 IMPLANT
WATER STERILE IRR 1000ML POUR (IV SOLUTION) ×3 IMPLANT

## 2014-01-29 NOTE — H&P (Signed)
History of Present Illness  The patient is a 43 year old male who comes in today for a preoperative History and Physical. The patient is scheduled for a XLIF L2-3 to be performed by Dr. Debria Garretahari D. Shon BatonBrooks, MD at [facility] on 01/29/2014 . Please see the hospital record for complete dictated history and physical.   Post opis described as the following: Patient is 13 weeks postop following their lumbar L2 laminotomy/diskectomy (on 10/15/13 and he is here to discuss his lumbar CT scan).Overall the patient feels that they are not doing well. Post operative pain has been severe (right leg and feels like a grinding in the back). The patient does report pain (feels something popping, has weakness in the right leg) and tenderness, while the patient does not report fever or chills The patient does indicate that these symptoms are worsening. The patient is not using a brace. The patient is currently not working. Note for "Post op": The patient is very concerned with the popping he feels in the back. He is also having difficulty with walking.  Augusto GambleJody is a 43 year old white male who has a history of L2-3 degenerative disk disease and back pain. He returns today stating he continues to have ongoing symptoms. He is willing to proceed with an L2-3 XLIF that is scheduled next week. The patient denies cardiac, pulmonary, GI or GU issues.    Allergies No Known Drug Allergies    Family History Heart Disease. father and grandfather mothers side Hypertension. father, grandmother mothers side, grandfather mothers side, grandmother fathers side and grandfather fathers side Kidney disease. Father. father Cerebrovascular Accident. grandmother mothers side Diabetes Mellitus. father Drug / Alcohol Addiction. grandfather fathers side    Social History Number of flights of stairs before winded. less than 1 Marital status. divorced Living situation. live with parents Tobacco use.  Current every day smoker. current every day smoker; smoke(d) 3/4 pack(s) per day Tobacco / smoke exposure. no Pain Contract. no Illicit drug use. yes Current work status. unemployed Children. 1 Alcohol use. current drinker; drinks beer, wine and hard liquor; only occasionally per week Exercise. Exercises never; does running / walking Drug/Alcohol Rehab (Previously). yes Drug/Alcohol Rehab (Currently). no    Medication History Mupirocin (2% Ointment, 1 (one) Ointment External appy to nostril bid, Taken starting 10/10/2013) Active. (rx sent to [facility] 10/10/13) Robaxin (500MG  Tablet, Oral) Active. Toprol XL (100MG  Tablet ER 24HR, Oral) Active. (qd) Hydrochlorothiazide (12.5MG  Capsule, Oral) Active. (qd) Gabapentin (300MG  Capsule, Oral) Active. Lisinopril (30MG  Tablet, Oral) Active. (qd) Omeprazole (20MG  Tablet DR, Oral) Active. (qd) Medications Reconciled.    Vitals 01/23/2014 8:22 AM Weight: 205 lb Height: 67 in Body Surface Area: 2.1 m Body Mass Index: 32.11 kg/m Temp.: 98.4 FPulse: 57 (Regular) BP: 129/89 (Sitting, Left Arm, Standard)     Objective Transcription  He is a pleasant white male. He is alert and oriented times three. He is in no acute distress. Head is normocephalic, atraumatic. PERRLA. EOMI Cervical spine is unremarkable. Lungs are CTA bilaterally. No wheezing is noted. Heart RRR, bradycardic. No murmurs. Abdomen is round and non-distended. NBS times four. Soft and tender. Gait is normal. Decreased lumbar flexion and extension. Neurologically intact. Skin warm and dry.  Plans Transcription  At this point in time the only option I would have for him would either be pain medical management, or we could consider doing a lateral interbody 2-3 fusion. I discussed the risks with the patient and his father. They were both present  for the dictation. The risks of that include infection, bleeding, nerve damage, death, stoke,  paralysis, failure to heal, need for further surgery, ongoing or worse pain, loss of bowel and bladder control, damage to the lumbar plexus with chronic debilitating hip pain or diminished ability to ambulate due to hip flexor weakness.Patient decided to proceed with surgery.

## 2014-01-29 NOTE — Progress Notes (Signed)
01/29/14 1130  OBSTRUCTIVE SLEEP APNEA  Have you ever been diagnosed with sleep apnea through a sleep study? No  Do you snore loudly (loud enough to be heard through closed doors)?  0  Do you often feel tired, fatigued, or sleepy during the daytime? 0  Has anyone observed you stop breathing during your sleep? 0  Do you have, or are you being treated for high blood pressure? 1  BMI more than 35 kg/m2? 1  Age over 45 years old? 0  Neck circumference greater than 40 cm/16 inches? 1  Gender: 1  Obstructive Sleep Apnea Score 4  Score 4 or greater  Results sent to PCP

## 2014-01-29 NOTE — Transfer of Care (Signed)
Immediate Anesthesia Transfer of Care Note  Patient: Bryan Huffman  Procedure(s) Performed: Procedure(s): X LIF L2-L3 WITH LATERAL PLATES -ANTERIOR LATERAL LUMBAR FUSION 1 LEVEL (N/A)  Patient Location: PACU  Anesthesia Type:General  Level of Consciousness: awake, alert , oriented and patient cooperative  Airway & Oxygen Therapy: Patient Spontanous Breathing and Patient connected to face mask oxygen  Post-op Assessment: Report given to PACU RN and Post -op Vital signs reviewed and stable  Post vital signs: Reviewed  Complications: No apparent anesthesia complications

## 2014-01-29 NOTE — Brief Op Note (Signed)
01/29/2014  2:42 PM  PATIENT:  Bryan Huffman  43 y.o. male  PRE-OPERATIVE DIAGNOSIS:  DDD AND FAILED BACK SYNDROME   POST-OPERATIVE DIAGNOSIS:  DDD AND FAILED BACK SYNDROME   PROCEDURE:  Procedure(s): X LIF L2-L3 WITH LATERAL PLATES -ANTERIOR LATERAL LUMBAR FUSION 1 LEVEL (N/A)  SURGEON:  Surgeon(s) and Role:    * Venita Lickahari Meleah Demeyer, MD - Primary  PHYSICIAN ASSISTANT:   ASSISTANTS: james owens   ANESTHESIA:   general  EBL:  Total I/O In: 1000 [I.V.:1000] Out: 75 [Urine:75]  BLOOD ADMINISTERED:none  DRAINS: none   LOCAL MEDICATIONS USED:  MARCAINE     SPECIMEN:  No Specimen  DISPOSITION OF SPECIMEN:  N/A  COUNTS:  YES  TOURNIQUET:  * No tourniquets in log *  DICTATION: .Other Dictation: Dictation Number [id number]  PLAN OF CARE: Admit to inpatient   PATIENT DISPOSITION:  PACU - hemodynamically stable.

## 2014-01-29 NOTE — Anesthesia Procedure Notes (Signed)
Procedure Name: Intubation Date/Time: 01/29/2014 12:34 PM Performed by: Lovie CholOCK, Neave Lenger K Pre-anesthesia Checklist: Patient identified, Emergency Drugs available, Suction available, Patient being monitored and Timeout performed Patient Re-evaluated:Patient Re-evaluated prior to inductionOxygen Delivery Method: Circle system utilized Preoxygenation: Pre-oxygenation with 100% oxygen Intubation Type: IV induction Ventilation: Mask ventilation without difficulty and Oral airway inserted - appropriate to patient size Laryngoscope Size: Miller and 2 Grade View: Grade II Tube type: Oral Tube size: 7.5 mm Number of attempts: 1 Airway Equipment and Method: Stylet Placement Confirmation: ETT inserted through vocal cords under direct vision,  positive ETCO2,  CO2 detector and breath sounds checked- equal and bilateral Secured at: 23 cm Tube secured with: Tape Dental Injury: Teeth and Oropharynx as per pre-operative assessment

## 2014-01-29 NOTE — Anesthesia Preprocedure Evaluation (Signed)
Anesthesia Evaluation  Patient identified by MRN, date of birth, ID band Patient awake    Reviewed: Allergy & Precautions, H&P , NPO status , Patient's Chart, lab work & pertinent test results, reviewed documented beta blocker date and time   Airway Mallampati: II TM Distance: >3 FB Neck ROM: Full    Dental no notable dental hx. (+) Teeth Intact, Dental Advisory Given   Pulmonary neg pulmonary ROS, Current Smoker,  breath sounds clear to auscultation  Pulmonary exam normal       Cardiovascular hypertension, On Medications Rhythm:Regular Rate:Normal     Neuro/Psych Depression negative neurological ROS     GI/Hepatic Neg liver ROS, GERD-  Medicated and Controlled,  Endo/Other  negative endocrine ROS  Renal/GU negative Renal ROS  negative genitourinary   Musculoskeletal   Abdominal   Peds  Hematology negative hematology ROS (+)   Anesthesia Other Findings   Reproductive/Obstetrics negative OB ROS                           Anesthesia Physical Anesthesia Plan  ASA: II  Anesthesia Plan: General   Post-op Pain Management:    Induction: Intravenous  Airway Management Planned: Oral ETT  Additional Equipment:   Intra-op Plan:   Post-operative Plan: Extubation in OR  Informed Consent: I have reviewed the patients History and Physical, chart, labs and discussed the procedure including the risks, benefits and alternatives for the proposed anesthesia with the patient or authorized representative who has indicated his/her understanding and acceptance.   Dental advisory given  Plan Discussed with: CRNA  Anesthesia Plan Comments:         Anesthesia Quick Evaluation

## 2014-01-30 ENCOUNTER — Inpatient Hospital Stay (HOSPITAL_COMMUNITY): Payer: Medicaid Other

## 2014-01-30 MED ORDER — DOCUSATE SODIUM 100 MG PO CAPS: 100.0000 mg | ORAL_CAPSULE | Freq: Two times a day (BID) | ORAL | Status: DC

## 2014-01-30 MED ORDER — OXYCODONE-ACETAMINOPHEN 10-325 MG PO TABS: 1.0000 | ORAL_TABLET | Freq: Four times a day (QID) | ORAL | Status: DC | PRN

## 2014-01-30 MED ORDER — ONDANSETRON 4 MG PO TBDP: 4.0000 mg | ORAL_TABLET | Freq: Three times a day (TID) | ORAL | Status: DC | PRN

## 2014-01-30 MED ORDER — METHOCARBAMOL 750 MG PO TABS: 750.0000 mg | ORAL_TABLET | Freq: Four times a day (QID) | ORAL | Status: DC | PRN

## 2014-01-30 MED ORDER — POLYETHYLENE GLYCOL 3350 17 G PO PACK: 17.0000 g | PACK | Freq: Every day | ORAL | Status: DC

## 2014-01-30 NOTE — Progress Notes (Signed)
Utilization review completed.  

## 2014-01-30 NOTE — Progress Notes (Signed)
Occupational Therapy Evaluation and Discharge Patient Details Name: Bryan Huffman MRN: [id number] DOB: 1971/09/20 Today's Date: 01/30/2014    History of Present Illness Pt is 43 yo Male s/p anterior lateral lumbar fusion 1 level for back pain. Pt with hx of multiple back surgeries.   Clinical Impression   PTA pt lived at home and occasionally needed assistance with socks and shoes, however mostly independent with ADLs and functional mobility. Educated pt on 3/3 back precautions and lifting restrictions with pt recall 3/3 correctly. Educated pt on energy conservation and joint protection techniques. No further acute OT needs. Pt was ambulating in room when OT arrived and has no apparent PT needs. Acute OT to sign off.     Follow Up Recommendations  No OT follow up;Supervision - Intermittent    Equipment Recommendations  None recommended by OT       Precautions / Restrictions Precautions Precautions: Back Precaution Booklet Issued: Yes (comment) Required Braces or Orthoses: Spinal Brace Spinal Brace: Lumbar corset Restrictions Weight Bearing Restrictions: No      Mobility Bed Mobility Overal bed mobility: Modified Independent             General bed mobility comments: Verbally educated pt on log roll technique  Transfers Overall transfer level: Modified independent Equipment used: None                  Balance Overall balance assessment: No apparent balance deficits (not formally assessed);Modified Independent                                          ADL Overall ADL's : Modified independent                                       General ADL Comments: Pt up and moving around room when OT arrived. Pt with no c/o pain and moving well. Able to don/doff brace independently.     Vision  Per pt report, no change from baseline.                          Pertinent Vitals/Pain No c/o pain.     Hand Dominance  Right   Extremity/Trunk Assessment Upper Extremity Assessment Upper Extremity Assessment: Overall WFL for tasks assessed   Lower Extremity Assessment Lower Extremity Assessment: Overall WFL for tasks assessed   Cervical / Trunk Assessment Cervical / Trunk Assessment: Normal   Communication Communication Communication: No difficulties   Cognition Arousal/Alertness: Awake/alert Behavior During Therapy: WFL for tasks assessed/performed Overall Cognitive Status: Within Functional Limits for tasks assessed                                Home Living Family/patient expects to be discharged to:: Private residence Living Arrangements: Parent Available Help at Discharge: Family;Available 24 hours/day Type of Home: House Home Access: Stairs to enter Enter[employer] CorporationEntrance Stairs-Number of Steps: 3   Home Layout: One level     Bathroom Shower/Tub: Producer, television/film/videoWalk-in shower   Bathroom Toilet: Standard     Home Equipment: Environmental consultantWalker - 2 wheels;Crutches          Prior Functioning/Environment Level of Independence: Independent with assistive device(s);Needs assistance    ADL's / Homemaking Assistance  Needed: Occasionally needs assist with shoes and socks                                  End of Session Equipment Utilized During Treatment: Back brace Nurse Communication: Other (comment) (pt ready for d/c from OT standpoint)  Activity Tolerance: Patient tolerated treatment well Patient left: in chair;with call bell/phone within reach   Time: 0905-0915 OT Time Calculation (min): 10 min Charges:  OT General Charges $OT Visit: 1 Procedure OT Evaluation $Initial OT Evaluation Tier I: 1 Procedure  Bryan Huffman [phone] 01/30/2014, 9:40 AM

## 2014-01-30 NOTE — Op Note (Signed)
NAMFransisca Kaufmann:  Everetts, Zalyn              ACCOUNT NO.:  [id number]  MEDICAL RECORD NO.:  [id number]  LOCATION:  5N07C                        FACILITY:  [facility]  PHYSICIAN:  Alvy Bealahari D Jiovani Mccammon, MD    DATE OF BIRTH:  1971/02/02  DATE OF PROCEDURE: DATE OF DISCHARGE:                              OPERATIVE REPORT   PREOPERATIVE DIAGNOSIS:  Adjacent segment degenerative disk disease; postlaminectomy syndrome, L2-3.  POSTOPERATIVE DIAGNOSIS:  Adjacent segment degenerative disk disease; postlaminectomy syndrome, L2-3.  OPERATIVE PROCEDURE:  Lateral interbody fusion with peek interbody spacer from [employer], size 10 parallel with the lateral interbody plate application and fusion with instrumentation L2-3.  COMPLICATIONS:  None.  CONDITION:  Stable.  HISTORY:  This is a very pleasant 43 year old gentleman who has had chronic back pain.  Prior to seeing me in the past, he has had several surgeries, which resulted ultimately in L3-S1 instrumented posterior fusion.  The patient had a disk herniation with radicular leg pain that I had performed a microdiskectomy and he did well.  Although, his radicular leg pain as resolved, his back pain has gotten significantly worse.  Attempts at conservative management consisting of physiotherapy, injection therapy, narcotic, and nonnarcotic medications, and activity modification had failed to alleviate his symptoms.  As a result, he elected to proceed with surgery.  All appropriate risks, benefits, and alternatives were discussed with the patient and consent was obtained.  OPERATIVE NOTE:  The patient was brought to the operating room, placed supine on the operating table.  After successful induction of general anesthesia and endotracheal intubation, TEDs, SCDs, and Foley were inserted.  The needles were placed into the lower extremity for intraoperative neurostimulator monitoring.  Once all the monitors were applied, he was turned into the right lateral  decubitus position left side up.  Axillary roll was placed.  All bony prominences were well padded, and the patient was taped down to the table.  He was secured so that we had an excellent AP and lateral view with fluoro.  Once this was completed, the flank was prepped and draped in a standard fashion.  Time- out was taken to confirm patient, procedure, and all other pertinent important data.  Once this was done, Marcaine was used to infiltrate the proposed incision site centered over the L2-3 disk space.  Time-out was taken and we proceeded with the surgery.  An incision was made.  The length of the disk space on the left flank.  Sharp dissection was carried down to the fascia of the external plate.  Once this was identified, I made a second incision with 1 fingerbreadth posterior lateral to this one.  I bluntly dissected to the retroperitoneal fascia. I popped through the retroperitoneal fascia with the trocar and then used my finger to blunt Tresa EndoKelly and then used my finger to mobilize the soft tissue in the retroperitoneal space.  I then dissected bluntly dissected to the fibers of the oblique and then brought the 1st trocar down to the surface of the psoas.  I then stemmed circumferentially on the psoas and confirmed that I was not on the top of the plexus.  I then properly positioned my device about in the posterior 3rd of  the disk space and then went through the psoas down to the disk space itself.  I made a single pass and then secured the trocar with the guide pin.  I then stimulated in a circumferential fashion to ensure that the plexus was not being injured.  There is also no free running EMGs indicate plexus trauma I then sequentially dilated and again with each dilator stimulated.  I then placed the final retracting device and secured it to the table.  I expanded inches gently and I had excellent visualization of the disk space.  I then placed the trocar blade into the disk  space and confirmed satisfactory position in both planes.  Once the lateral aspect of the disk was properly exposed, a 10-blade scalpel was used to perform an annulotomy.  I then used a combination of pituitary rongeurs, size scraping curettes, and rasps to remove all the disk material at the L2-3 level __________ and great care was taken not to violate the anterior or posterior longitudinal ligament.  I then released the contralateral anulus and again scraped until I was certain both by auditory cues and visualization that I was on the endplates.  I then trialed starting with an 8 parallel trial up to a size 12.  The 12 was significantly large and I could not get this to fit.  I then elected to use a 10 parallel peak spacer.  This was obtained and packed with Optigraft and then malleted to the appropriate depth.  I had excellent positioning in both the AP and lateral planes.  Because of the previous posterior pedicle screw construct I elected to use the lateral plate with a size 12.  The plate was obtained and secured on the lateral side of the vertebral body.  Using an awl, I placed a 50 mm screw at L3 and 50 mm screw at L2.  I had excellent purchase with the screws.  They were torqued down and locked into place the corner manufacture standards. The plate was also torqued in to prevent closure.  At this point, I irrigated copiously normal saline, and removed the retracting blades.  I did not make sure, I had hemostasis prior to removing them with bipolar electrocautery.  I then closed the deep fascia with interrupted #1 Vicryl sutures, 2-0 Vicryl sutures on the superficial, and 3-0 Monocryl for the skin.  Steri-Strips and a dry dressing were applied to both wounds.  The patient was ultimately extubated, transferred to PACU without incident.  At the end of the case, all needle and sponge counts were correct.  There were no adverse intraoperative events.  First assistant, Zonia KiefJames Owens, PA  was instrumental in assisting with retraction, visualization, suction, and wound closure.     Alvy Bealahari D Latesha Chesney, MD     DDB/MEDQ  D:  01/29/2014  T:  01/30/2014  Job:  [id number]

## 2014-01-30 NOTE — Anesthesia Postprocedure Evaluation (Signed)
  Anesthesia Post-op Note  Patient: Bryan Huffman  Procedure(s) Performed: Procedure(s): X LIF L2-L3 WITH LATERAL PLATES -ANTERIOR LATERAL LUMBAR FUSION 1 LEVEL (N/A)  Patient Location: PACU  Anesthesia Type:General  Level of Consciousness: awake and alert   Airway and Oxygen Therapy: Patient Spontanous Breathing  Post-op Pain: moderate  Post-op Assessment: Post-op Vital signs reviewed, Patient's Cardiovascular Status Stable and Respiratory Function Stable  Post-op Vital Signs: Reviewed  Filed Vitals:   01/30/14 0430  BP: 117/78  Pulse: 77  Temp: 36.8 C  Resp: 16    Complications: No apparent anesthesia complications

## 2014-01-30 NOTE — Progress Notes (Signed)
Pt very uncomfortable and agitated in bed. Requested to ambulate. Pt ambulated, donning brace while sitting edge of bed, approximately 2 laps around unit. Tolerated very well. Pt stated decrease in pain since getting OOB. Ambulated with minimum 1+assist. Encouraged to rest and not over do it for tonight. Pt currently resting comfortably in bed. Will continue to monitor.

## 2014-01-30 NOTE — Progress Notes (Signed)
01/30/14 0848  PT Visit Information  Last PT Received On 01/30/14  Reason Eval/Treat Not Completed PT screened, no needs identified, will sign off  History of Present Illness Pt is 43 yo Male s/p anterior lateral lumbar fusion 1 level for back pain. Pt with hx of multiple back surgeries.   Spoke with OT, no acute PT needs at this time, will sign off.  Charlotte Crumbevon Lawrence Mitch, PT DPT  [phone]

## 2014-01-30 NOTE — Progress Notes (Signed)
    Subjective: Procedure(s) (LRB): X LIF L2-L3 WITH LATERAL PLATES -ANTERIOR LATERAL LUMBAR FUSION 1 LEVEL (N/A) 1 Day Post-Op  Patient reports pain as 3 on 0-10 scale.  Reports decreased leg pain reports incisional back pain Voiding spontaneously Negative bowel movement Positive flatus Negative chest pain or shortness of breath  Objective: Vital signs in last 24 hours: Temp:  [97.5 F (36.4 C)-98.4 F (36.9 C)] 98.3 F (36.8 C) (04/24 0430) Pulse Rate:  [70-94] 77 (04/24 0430) Resp:  [13-36] 16 (04/24 0430) BP: (101-135)/(71-87) 117/78 mmHg (04/24 0430) SpO2:  [2 %-99 %] 96 % (04/24 0430) Weight:  [207 lb (93.895 kg)] 207 lb (93.895 kg) (04/23 1113)  Intake/Output from previous day: 04/23 0701 - 04/24 0700 In: 2459.6 [P.O.:440; I.V.:2019.6] Out: 1100 [Urine:1075; Blood:25]  Labs:  Recent Labs  01/27/14 0926  WBC 12.0*  RBC 4.87  HCT 43.4  PLT 335    Recent Labs  01/27/14 0926  NA 137  K 5.1  CL 99  CO2 21  BUN 16  CREATININE 0.73  GLUCOSE 99  CALCIUM 10.1   No results found for this basename: LABPT, INR,  in the last 72 hours  Physical Exam: Neurologically intact ABD soft Intact pulses distally Incision: dressing C/D/I and no drainage Compartment soft  Assessment/Plan: Patient stable  xrays satisfactory Continue mobilization with physical therapy Continue care  Advance diet Up with therapy D/C IV fluids Plan for discharge tomorrow Doing well   Venita Lickahari Ludivina Guymon, MD [facility] [phone]

## 2014-01-30 NOTE — Care Management Note (Signed)
CARE MANAGEMENT NOTE 01/30/2014  Patient:  Delia HeadyMCMASTERS,Merric A   Account Number:  [id number]  Date Initiated:  01/30/2014  Documentation initiated by:  Vance PeperBRADY,Blong Busk  Subjective/Objective Assessment:   43 yr old male s/p L2-3  Lateral fusion.     Action/Plan:   Patient being discharged home. Has no home health needs per PT/OT assessment. Has brace. Has family support at discharge.   Anticipated DC Date:  01/30/2014   Anticipated DC Plan:  HOME/SELF CARE      DC Planning Services  CM consult      PAC Choice  NA   Choice offered to / List presented to:     DME arranged  NA        HH arranged  NA      Status of service:  Completed, signed off Medicare Important Message given?   (If response is "NO", the following Medicare IM given date fields will be blank) Date Medicare IM given:   Date Additional Medicare IM given:    Discharge Disposition:  HOME/SELF CARE

## 2014-02-03 ENCOUNTER — Encounter (HOSPITAL_COMMUNITY): Payer: Self-pay | Admitting: Orthopedic Surgery

## 2014-02-10 NOTE — Discharge Summary (Signed)
Patient ID: Herbert SpiresJody A Ober MRN: [id number] DOB/AGE: December 12, 1970 43 y.o.  Admit date: 01/29/2014 Discharge date: 02/10/2014  Admission Diagnoses:  Active Problems:   Back pain   Discharge Diagnoses:  Active Problems:   Back pain  status post Procedure(s): X LIF L2-L3 WITH LATERAL PLATES -ANTERIOR LATERAL LUMBAR FUSION 1 LEVEL  Past Medical History  Diagnosis Date  . Hypertension   . Back pain   . Insomnia   . Mental disorder   . Depression   . GERD (gastroesophageal reflux disease)   . Hemorrhoid   . Arthritis     Surgeries: Procedure(s): X LIF L2-L3 WITH LATERAL PLATES -ANTERIOR LATERAL LUMBAR FUSION 1 LEVEL on 01/29/2014   Consultants:    Discharged Condition: Improved  Hospital Course: Herbert SpiresJody A Oyster is an 43 y.o. male who was admitted 01/29/2014 for operative treatment of L2-3 ddd. Patient failed conservative treatments (please see the history and physical for the specifics) and had severe unremitting pain that affects sleep, daily activities and work/hobbies. After pre-op clearance, the patient was taken to the operating room on 01/29/2014 and underwent  Procedure(s): X LIF L2-L3 WITH LATERAL PLATES -ANTERIOR LATERAL LUMBAR FUSION 1 LEVEL.    Patient was given perioperative antibiotics:  Anti-infectives   Start     Dose/Rate Route Frequency Ordered Stop   01/29/14 2000  ceFAZolin (ANCEF) IVPB 1 g/50 mL premix     1 g 100 mL/hr over 30 Minutes Intravenous Every 8 hours 01/29/14 1802 01/30/14 0450   01/28/14 1423  ceFAZolin (ANCEF) IVPB 2 g/50 mL premix     2 g 100 mL/hr over 30 Minutes Intravenous 30 min pre-op 01/28/14 1423 01/29/14 1232       Patient was given sequential compression devices and early ambulation to prevent DVT.   Patient benefited maximally from hospital stay and there were no complications. At the time of discharge, the patient was urinating/moving their bowels without difficulty, tolerating a regular diet, pain is controlled with oral  pain medications and they have been cleared by PT/OT.   Recent vital signs: No data found.    Recent laboratory studies: No results found for this basename: WBC, HGB, HCT, PLT, NA, K, CL, CO2, BUN, CREATININE, GLUCOSE, PT, INR, CALCIUM, 2,  in the last 72 hours   Discharge Medications:     Medication List         docusate sodium 100 MG capsule  Commonly known as:  COLACE  Take 1 capsule (100 mg total) by mouth 2 (two) times daily.     gabapentin 300 MG capsule  Commonly known as:  NEURONTIN  Take 1 capsule (300 mg total) by mouth 3 (three) times daily.     hydrochlorothiazide 12.5 MG capsule  Commonly known as:  MICROZIDE  Take 1 capsule (12.5 mg total) by mouth daily.     lisinopril 10 MG tablet  Commonly known as:  PRINIVIL,ZESTRIL  Take 1 tablet (10 mg total) by mouth daily.     methocarbamol 750 MG tablet  Commonly known as:  ROBAXIN  Take 1 tablet (750 mg total) by mouth every 6 (six) hours as needed for muscle spasms (back spasms).     metoprolol succinate 100 MG 24 hr tablet  Commonly known as:  TOPROL-XL  Take 1 tablet (100 mg total) by mouth daily. Take with or immediately following a meal.     omeprazole 20 MG capsule  Commonly known as:  PRILOSEC  Take 1 capsule (20 mg total) by  mouth daily.     ondansetron 4 MG disintegrating tablet  Commonly known as:  ZOFRAN ODT  Take 1 tablet (4 mg total) by mouth every 8 (eight) hours as needed for nausea or vomiting.     oxyCODONE-acetaminophen 10-325 MG per tablet  Commonly known as:  PERCOCET  Take 1-2 tablets by mouth every 6 (six) hours as needed for pain.     polyethylene glycol packet  Commonly known as:  MIRALAX / GLYCOLAX  Take 17 g by mouth daily.     traZODone 150 MG tablet  Commonly known as:  DESYREL  Take 1 tablet (150 mg total) by mouth at bedtime as needed for sleep.        Diagnostic Studies: Dg Lumbar Spine 2-3 Views  01/30/2014   CLINICAL DATA:  Status post spinal fusion  EXAM: LUMBAR  SPINE - 2-3 VIEW  COMPARISON:  01/29/2014  FINDINGS: Pedicular screws are noted from L3-L5 with posterior fixation bilaterally. This is stable from the prior exam. Mild anterolisthesis of L3 on L4 and L4 on L5 is again seen and stable. Interbody fusion is noted at L2-3 and L3-4. Lateral fixation device is noted on the left at L2-3.  IMPRESSION: No acute abnormality noted.   Electronically Signed   By: Alcide Clever M.D.   On: 01/30/2014 07:55   Dg Lumbar Spine 2-3 Views  01/29/2014   CLINICAL DATA:  L2-3 XLIF.  EXAM: LUMBAR SPINE - 2-3 VIEW; DG C-ARM 61-120 MIN  COMPARISON:  DG LUMBAR SPINE 2-3 VIEWS dated 01/27/2014; CT L SPINE W/O CM dated 01/05/2014  FINDINGS: Two spot fluoroscopic images of the mid lumbar spine are submitted. The pre-existing hardware at L3, L4 and L5 is unchanged. The L3 pedicle screws remain near the superior endplate of L3. L3-4 Ray cages are unchanged. XLIF has been performed at L2-3. As labeled, the lateral plate is on the right. The interbody spacer is well positioned. No complications are identified.  IMPRESSION: Intraoperative views following L2-3 XLIF. No demonstrated complication   Electronically Signed   By: Roxy Horseman M.D.   On: 01/29/2014 16:56   Dg Lumbar Spine 2-3 Views  01/27/2014   CLINICAL DATA:  Fusion.  EXAM: LUMBAR SPINE - 2-3 VIEW  COMPARISON:  CT L SPINE W/O CM dated 01/05/2014  FINDINGS: Stable 6 mm retrolisthesis L2-L3. Stable appearance of hardware related to L3-L4, L4-L5 fusion. Mild scoliosis concave right. Diffuse thoracolumbar and bilateral SI joint degenerative change.  IMPRESSION: 1. Stable 6 mm retrolisthesis L2 on L3. 2. Stable surgical hardware related to L3-L4 and L4-L5 fusion. 3. Mild lumbar spine with scoliosis concave right. Diffuse degenerative change of the thoracolumbar spine and both SI joints.   Electronically Signed   By: Maisie Fus  Register   On: 01/27/2014 10:52   Dg C-arm 61-120 Min  01/29/2014   CLINICAL DATA:  L2-3 XLIF.  EXAM: LUMBAR SPINE -  2-3 VIEW; DG C-ARM 61-120 MIN  COMPARISON:  DG LUMBAR SPINE 2-3 VIEWS dated 01/27/2014; CT L SPINE W/O CM dated 01/05/2014  FINDINGS: Two spot fluoroscopic images of the mid lumbar spine are submitted. The pre-existing hardware at L3, L4 and L5 is unchanged. The L3 pedicle screws remain near the superior endplate of L3. L3-4 Ray cages are unchanged. XLIF has been performed at L2-3. As labeled, the lateral plate is on the right. The interbody spacer is well positioned. No complications are identified.  IMPRESSION: Intraoperative views following L2-3 XLIF. No demonstrated complication   Electronically Signed  By: Roxy HorsemanBill  Veazey M.D.   On: 01/29/2014 16:56        Discharge Orders   Future Orders Complete By Expires   Call MD / Call 911  As directed    Constipation Prevention  As directed    Diet - low sodium heart healthy  As directed    Discharge instructions  As directed    Driving restrictions  As directed    Increase activity slowly as tolerated  As directed    Lifting restrictions  As directed       Follow-up Information   Schedule an appointment as soon as possible for a visit with Alvy BealBROOKS,DAHARI D, MD. (needs return office visit 2 weeks postop.  )    Specialty:  Orthopedic Surgery   Contact information:   [address] [place] [address] [phone]       Discharge Plan:  discharge to home  Disposition:     Signed: Zonia KiefJames Slaton Reaser for Dr. Venita Lickahari Brooks [facility] [phone] 02/10/2014, 9:24 AM

## 2014-02-10 NOTE — Discharge Summary (Signed)
Agree with above 

## 2014-08-04 ENCOUNTER — Emergency Department: Payer: Self-pay | Admitting: Internal Medicine

## 2014-08-04 LAB — BASIC METABOLIC PANEL
Anion Gap: 3 — ABNORMAL LOW (ref 7–16)
BUN: 26 mg/dL — ABNORMAL HIGH (ref 7–18)
CALCIUM: 8.5 mg/dL (ref 8.5–10.1)
CREATININE: 0.89 mg/dL (ref 0.60–1.30)
Chloride: 103 mmol/L (ref 98–107)
Co2: 31 mmol/L (ref 21–32)
EGFR (African American): >60
EGFR (Non-African Amer.): >60
Glucose: 93 mg/dL (ref 65–99)
Osmolality: 278 (ref 275–301)
POTASSIUM: 4.4 mmol/L (ref 3.5–5.1)
Sodium: 137 mmol/L (ref 136–145)

## 2014-08-04 LAB — CBC WITH DIFFERENTIAL/PLATELET
BASOS PCT: 0.7 %
Basophil #: 0.1 10*3/uL (ref 0.0–0.1)
EOS ABS: 0.3 10*3/uL (ref 0.0–0.7)
Eosinophil %: 2.8 %
HCT: 38.2 % — ABNORMAL LOW (ref 40.0–52.0)
HGB: 12.2 g/dL — ABNORMAL LOW (ref 13.0–18.0)
LYMPHS ABS: 2 10*3/uL (ref 1.0–3.6)
Lymphocyte %: 18 %
MCH: 28.3 pg (ref 26.0–34.0)
MCHC: 31.8 g/dL — AB (ref 32.0–36.0)
MCV: 89 fL (ref 80–100)
Monocyte #: 0.6 x10 3/mm (ref 0.2–1.0)
Monocyte %: 5.2 %
Neutrophil #: 8 10*3/uL — ABNORMAL HIGH (ref 1.4–6.5)
Neutrophil %: 73.3 %
Platelet: 226 10*3/uL (ref 150–440)
RBC: 4.29 10*6/uL — AB (ref 4.40–5.90)
RDW: 14.6 % — AB (ref 11.5–14.5)
WBC: 10.9 10*3/uL — ABNORMAL HIGH (ref 3.8–10.6)

## 2014-08-08 LAB — WOUND CULTURE

## 2014-09-02 ENCOUNTER — Other Ambulatory Visit: Payer: Self-pay | Admitting: Internal Medicine

## 2014-09-02 ENCOUNTER — Telehealth: Payer: Self-pay | Admitting: *Deleted

## 2014-09-02 ENCOUNTER — Other Ambulatory Visit: Payer: Self-pay | Admitting: *Deleted

## 2014-09-02 ENCOUNTER — Encounter (HOSPITAL_BASED_OUTPATIENT_CLINIC_OR_DEPARTMENT_OTHER): Payer: Self-pay | Admitting: *Deleted

## 2014-09-02 ENCOUNTER — Encounter: Payer: Self-pay | Admitting: Internal Medicine

## 2014-09-02 ENCOUNTER — Ambulatory Visit (INDEPENDENT_AMBULATORY_CARE_PROVIDER_SITE_OTHER): Payer: Medicaid Other | Admitting: Internal Medicine

## 2014-09-02 ENCOUNTER — Other Ambulatory Visit: Payer: Self-pay | Admitting: Orthopedic Surgery

## 2014-09-02 VITALS — BP 151/87 | HR 80 | Temp 97.8°F | Ht 67.0 in | Wt 214.0 lb

## 2014-09-02 DIAGNOSIS — M86141 Other acute osteomyelitis, right hand: Secondary | ICD-10-CM

## 2014-09-02 DIAGNOSIS — I10 Essential (primary) hypertension: Secondary | ICD-10-CM | POA: Insufficient documentation

## 2014-09-02 DIAGNOSIS — Z720 Tobacco use: Secondary | ICD-10-CM

## 2014-09-02 DIAGNOSIS — Z8719 Personal history of other diseases of the digestive system: Secondary | ICD-10-CM

## 2014-09-02 DIAGNOSIS — F17210 Nicotine dependence, cigarettes, uncomplicated: Secondary | ICD-10-CM | POA: Insufficient documentation

## 2014-09-02 DIAGNOSIS — Z8659 Personal history of other mental and behavioral disorders: Secondary | ICD-10-CM | POA: Insufficient documentation

## 2014-09-02 DIAGNOSIS — K219 Gastro-esophageal reflux disease without esophagitis: Secondary | ICD-10-CM | POA: Insufficient documentation

## 2014-09-02 DIAGNOSIS — M869 Osteomyelitis, unspecified: Secondary | ICD-10-CM | POA: Insufficient documentation

## 2014-09-02 DIAGNOSIS — F1910 Other psychoactive substance abuse, uncomplicated: Secondary | ICD-10-CM

## 2014-09-02 LAB — CBC
HEMATOCRIT: 40.5 % (ref 39.0–52.0)
HEMOGLOBIN: 13.9 g/dL (ref 13.0–17.0)
MCH: 28.4 pg (ref 26.0–34.0)
MCHC: 34.3 g/dL (ref 30.0–36.0)
MCV: 82.7 fL (ref 78.0–100.0)
MPV: 10.3 fL (ref 9.4–12.4)
Platelets: 280 10*3/uL (ref 150–400)
RBC: 4.9 MIL/uL (ref 4.22–5.81)
RDW: 15 % (ref 11.5–15.5)
WBC: 7.2 10*3/uL (ref 4.0–10.5)

## 2014-09-02 NOTE — H&P (Signed)
Bryan Huffman is an 43 y.o. male.   CC / Reason for Visit: Right hand problem HPI: This patient is a 43 year old male who presents for evaluation of his right hand.  He has a slightly extended history, reportedly falling onto his hand 2 months or more ago, having initially pain and swelling over the third MCP joint.  He has been on Keflex and clindamycin, and has developed persisting enlargement and intermittent drainage overlying the region.  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  Dr. Jerl Santosalldorf saw him on 08-26-14, and ordered an MRI scan of the right hand which was apparently completed yesterday, revealing some large soft tissues in the region but also some changes within the third metacarpal distally concerning for osteomyelitis.  I became aware of the patient and his problem earlier in the day today, and arrange for him to have an evaluation today by Dr. Orvan Falconerampbell with infectious diseases, who has evaluated the patient and concurred with a plan for debridement and protracted IV antibiotics.  The patient has a planned trip to the beach for the next few days.  Past Medical History  Diagnosis Date  . Hypertension   . Back pain   . Insomnia   . Mental disorder   . Depression   . GERD (gastroesophageal reflux disease)   . Hemorrhoid   . Arthritis     Past Surgical History  Procedure Laterality Date  . Tonsillectomy    . Back surgery  1998,2008    L3 L4 L5 fusion  . Lumbar spine surgery  10/15/2013    L 2 L3  DISECTOMY  . Lumbar laminectomy/decompression microdiscectomy Left 10/15/2013    Procedure: LUMBAR 2-3 LEFT DISCECTOMY;  Surgeon: Venita Lickahari Brooks, MD;  Location: [facility] OR;  Service: Orthopedics;  Laterality: Left;  . Anterior lat lumbar fusion N/A 01/29/2014    Procedure: X LIF L2-L3 WITH LATERAL PLATES -ANTERIOR LATERAL LUMBAR FUSION 1 LEVEL;  Surgeon: Venita Lickahari Brooks, MD;  Location: [facility] OR;  Service: Orthopedics;  Laterality: N/A;  . Spine surgery      Family  History  Problem Relation Age of Onset  . Hyperlipidemia Father    Social History:  reports that he has been smoking Cigarettes.  He has a 9 pack-year smoking history. He has never used smokeless tobacco. He reports that he drinks about 1.2 oz of alcohol per week. He reports that he uses illicit drugs (Marijuana, Oxycodone, and "Crack" cocaine).  Allergies:  Allergies  Allergen Reactions  . Bee Venom Swelling    No prescriptions prior to admission    No results found for this or any previous visit (from the past 48 hour(s)). No results found.  Review of Systems  All other systems reviewed and are negative.   Height 5\' 7"  (1.702 m), weight 97.07 kg (214 lb). Physical Exam  Constitutional:  WD, WN, NAD HEENT:  NCAT, EOMI Neuro/Psych:  Alert & oriented to person, place, and time; appropriate mood & affect Lymphatic: No generalized UE edema or lymphadenopathy Extremities / MSK:  Both UE are normal with respect to appearance, ranges of motion, joint stability, muscle strength/tone, sensation, & perfusion except as otherwise noted:  The right hand is enlarged dorsally with some soft tissue swelling over the distal aspect of the third metacarpal.  He still has reasonably good motion with minimal pain of the metacarpal.  There is some soreness with palpation of the enlarged soft tissues and there is a small area where it has drained in  the past, but nothing is expressible today.  Labs / Xrays:  No radiographic studies obtained today.  Assessment:  Right hand persistent subacute/chronic deep infection, possibly now with extension to bone.  Plan: We discussed options for proceeding.  I would like to keep him out of the hospital if possible, and worked out of plan for him to be debrided as an outpatient Monday morning, obtaining cultures at that time, and administering his first dose of vancomycin.  Infectious diseases has arrange for him to have a PICC line placed later on Monday and  begin home IV antibiotics at that time.  Questions were invited and answered and consent obtained.  Sophiarose Eades A. 09/02/2014, 6:27 PM

## 2014-09-02 NOTE — Progress Notes (Signed)
   09/02/14 1524  OBSTRUCTIVE SLEEP APNEA  Have you ever been diagnosed with sleep apnea through a sleep study? No  Do you snore loudly (loud enough to be heard through closed doors)?  1  Do you often feel tired, fatigued, or sleepy during the daytime? 0  Has anyone observed you stop breathing during your sleep? 1  Do you have, or are you being treated for high blood pressure? 1  BMI more than 35 kg/m2? 0  Age over 45 years old? 0  Neck circumference greater than 40 cm/16 inches? 1  Gender: 1  Obstructive Sleep Apnea Score 5  Score 4 or greater  Results sent to PCP

## 2014-09-02 NOTE — Telephone Encounter (Signed)
Patient notified of [facility] for 09/08/14 at 7:30 AM. He will receive 1rst dose of vancomycin at the surgeon on 09/07/14. He will [facility] his IV [facility] [phone] and [name] will [facility]. [facility] at [phone]. Wendall MolaJacqueline Jahnaya Branscome

## 2014-09-02 NOTE — Progress Notes (Signed)
Patient ID: Bryan Huffman, male   DOB: 04-05-1971, 43 y.o.   MRN: [id number]         [facility]  Reason for Consult: Septic arthritis of right third MCP joint with associated periarticular osteomyelitis and abscess Referring Physician: Dr. Mack Hookavid Thompson  Patient Active Problem List   Diagnosis Date Noted  . Osteomyelitis of hand 09/02/2014    Priority: High  . Polysubstance abuse 09/02/2014  . History of suicidal ideation 09/02/2014  . HTN (hypertension) 09/02/2014  . GERD (gastroesophageal reflux disease) 09/02/2014  . Cigarette smoker 09/02/2014  . History of pancreatitis 09/02/2014  . DDD (degenerative disc disease), lumbar 10/15/2013  . Alcohol dependence 07/26/2013  . Back pain 07/26/2013  . MDD (major depressive disorder) 07/26/2013    Patient's Medications  New Prescriptions   No medications on file  Previous Medications   HYDROCHLOROTHIAZIDE (MICROZIDE) 12.5 MG CAPSULE    Take 1 capsule (12.5 mg total) by mouth daily.   LISINOPRIL (PRINIVIL,ZESTRIL) 10 MG TABLET    Take 1 tablet (10 mg total) by mouth daily.   METOPROLOL SUCCINATE (TOPROL-XL) 100 MG 24 HR TABLET    Take 1 tablet (100 mg total) by mouth daily. Take with or immediately following a meal.   OMEPRAZOLE (PRILOSEC) 20 MG CAPSULE    Take 1 capsule (20 mg total) by mouth daily.   OXYCODONE-ACETAMINOPHEN (PERCOCET/ROXICET) 5-325 MG PER TABLET    Take 1 tablet by mouth every 4 (four) hours as needed for severe pain.   TRAZODONE (DESYREL) 150 MG TABLET    Take 1 tablet (150 mg total) by mouth at bedtime as needed for sleep.  Modified Medications   No medications on file  Discontinued Medications   DOCUSATE SODIUM (COLACE) 100 MG CAPSULE    Take 1 capsule (100 mg total) by mouth 2 (two) times daily.   GABAPENTIN (NEURONTIN) 300 MG CAPSULE    Take 1 capsule (300 mg total) by mouth 3 (three) times daily.   METHOCARBAMOL (ROBAXIN) 750 MG TABLET    Take 1 tablet (750 mg total) by mouth  every 6 (six) hours as needed for muscle spasms (back spasms).   ONDANSETRON (ZOFRAN ODT) 4 MG DISINTEGRATING TABLET    Take 1 tablet (4 mg total) by mouth every 8 (eight) hours as needed for nausea or vomiting.   POLYETHYLENE GLYCOL (MIRALAX / GLYCOLAX) PACKET    Take 17 g by mouth daily.    Recommendations: 1. PICC placement in anticipation of IV antibiotics 2. Incision and debridement with specimen sent for Gram stain, aerobic and anaerobic cultures   Assessment: Bryan Huffman has a septic third MCP joint on his right hand complicated by osteomyelitis and abscess. He will need incision and debridement. I will try to arrange PICC placement as soon as possible and plan on starting IV vancomycin postoperatively pending results of Gram stain and cultures.  He has a history of alcohol abuse and polysubstance abuse. He has also been hospitalized twice in the last year with depression and suicidal ideations. He states that his alcohol and drug use is under much better control. He denies ever using intravenous drugs. He had a PICC briefly when [place] was [place] in [place] pancreatitis several years ago. [place] with his mother and father and does not anticipate having any difficulty managing a PICC safely.   HPI: Bryan Huffman is a 43 y.o. male who fell while working in his garage 2 and half months  ago. He hit his right hand on the concrete floor and noted bleeding over the third knuckle. He washed his hand and clean the wound a few hours later when he was through working. It seemed to be healing well until about 1-1/2 months ago when he onset of painful swelling and redness over the dorsum of his hand. He was seen by his primary care provider and started on cephalexin. Within 24 hours his swelling and redness was markedly worse so he went [facility]. The physician there attempted to aspirate the swollen area over the  knuckle. He recalls that only maybe a drop of fluid was obtained. I did a swab culture of drainage. I called the lab and the Gram stain showed a few white blood cells but no organisms and aerobic and anaerobic cultures were negative. He received an IV antibiotic in the emergency department and was discharged on oral clindamycin which he took for 2 weeks. The more diffuse swelling and redness improved but he is continued to have painful swelling over the third knuckle with intermittent drainage of clear yellow fluid. He underwent an MRI scan yesterday which showed a fusion of the third MCP joint with patchy marrow edema and small marginal erosion of the distal third metacarpal. There is also extensive periarticular edema. There was a heterogeneous fluid collection over the dorsal extensor tendon sheath compatible with abscess.  Review of Systems: Constitutional: positive for chills and fatigue, negative for anorexia, fevers and weight loss Eyes: negative Ears, nose, mouth, throat, and face: negative Respiratory: negative Cardiovascular: negative Gastrointestinal: negative Genitourinary:negative    Past Medical History  Diagnosis Date  . Hypertension   . Back pain   . Insomnia   . Mental disorder   . Depression   . GERD (gastroesophageal reflux disease)   . Hemorrhoid   . Arthritis     History  Substance Use Topics  . Smoking status: Current Every Day Smoker -- 0.50 packs/day for 18 years    Types: Cigarettes  . Smokeless tobacco: Never Used     Comment: cutting back  . Alcohol Use: 1.2 oz/week    2 Glasses of wine per week    Family History  Problem Relation Age of Onset  . Hyperlipidemia Father    Allergies  Allergen Reactions  . Bee Venom Swelling    OBJECTIVE: Blood pressure 151/87, pulse 80, temperature 97.8 F (36.6 C), temperature source Oral, height 5\' 7"  (1.702 m), weight 214 lb (97.07 kg). General: He is worried but otherwise in no distress Skin: No  rash Lungs: Clear Cor: Regular S1 and S2 with no murmurs Abdomen: Obese, soft and nontender Right hand: Fluctuant swelling over the third knuckle. Some clear yellow fluid is expressible.  Microbiology: No results found for this or any previous visit (from the past 240 hour(s)).  Cliffton AstersJohn Isrrael Fluckiger, MD [facility] [phone] pager   [phone] 09/02/2014, 1:02 PM

## 2014-09-02 NOTE — Progress Notes (Signed)
Pt just saw dr Theadora Ramacampbell-to have picc put in Monday-labs done today-ekg just a yr-may need new one No fever -

## 2014-09-03 LAB — COMPREHENSIVE METABOLIC PANEL
ALBUMIN: 4.5 g/dL (ref 3.5–5.2)
ALT: 29 U/L (ref 0–53)
AST: 26 U/L (ref 0–37)
Alkaline Phosphatase: 69 U/L (ref 39–117)
BUN: 15 mg/dL (ref 6–23)
CHLORIDE: 100 meq/L (ref 96–112)
CO2: 26 mEq/L (ref 19–32)
Calcium: 9.7 mg/dL (ref 8.4–10.5)
Creat: 0.67 mg/dL (ref 0.50–1.35)
GLUCOSE: 88 mg/dL (ref 70–99)
Potassium: 4.9 mEq/L (ref 3.5–5.3)
SODIUM: 136 meq/L (ref 135–145)
Total Bilirubin: 0.2 mg/dL (ref 0.2–1.2)
Total Protein: 7.2 g/dL (ref 6.0–8.3)

## 2014-09-03 LAB — SEDIMENTATION RATE: SED RATE: 11 mm/h (ref 0–16)

## 2014-09-03 LAB — C-REACTIVE PROTEIN

## 2014-09-04 ENCOUNTER — Other Ambulatory Visit (HOSPITAL_COMMUNITY): Payer: Self-pay

## 2014-09-07 ENCOUNTER — Telehealth: Payer: Self-pay | Admitting: *Deleted

## 2014-09-07 ENCOUNTER — Encounter (HOSPITAL_BASED_OUTPATIENT_CLINIC_OR_DEPARTMENT_OTHER)
Admission: RE | Disposition: A | Discharge status: Home / Self Care | Payer: Self-pay | Source: Ambulatory Visit | Attending: Orthopedic Surgery

## 2014-09-07 ENCOUNTER — Encounter (HOSPITAL_BASED_OUTPATIENT_CLINIC_OR_DEPARTMENT_OTHER): Payer: Self-pay

## 2014-09-07 ENCOUNTER — Ambulatory Visit (HOSPITAL_BASED_OUTPATIENT_CLINIC_OR_DEPARTMENT_OTHER)
Admission: RE | Admit: 2014-09-07 | Discharge: 2014-09-07 | Disposition: A | Discharge status: Home / Self Care | Payer: Medicaid Other | Source: Ambulatory Visit | Attending: Orthopedic Surgery | Admitting: Orthopedic Surgery

## 2014-09-07 ENCOUNTER — Ambulatory Visit (HOSPITAL_BASED_OUTPATIENT_CLINIC_OR_DEPARTMENT_OTHER): Payer: Medicaid Other | Admitting: Certified Registered"

## 2014-09-07 DIAGNOSIS — L0889 Other specified local infections of the skin and subcutaneous tissue: Secondary | ICD-10-CM | POA: Diagnosis not present

## 2014-09-07 DIAGNOSIS — Z87891 Personal history of nicotine dependence: Secondary | ICD-10-CM | POA: Diagnosis not present

## 2014-09-07 DIAGNOSIS — I10 Essential (primary) hypertension: Secondary | ICD-10-CM | POA: Insufficient documentation

## 2014-09-07 DIAGNOSIS — K649 Unspecified hemorrhoids: Secondary | ICD-10-CM | POA: Insufficient documentation

## 2014-09-07 DIAGNOSIS — K219 Gastro-esophageal reflux disease without esophagitis: Secondary | ICD-10-CM | POA: Insufficient documentation

## 2014-09-07 DIAGNOSIS — F329 Major depressive disorder, single episode, unspecified: Secondary | ICD-10-CM | POA: Insufficient documentation

## 2014-09-07 DIAGNOSIS — G4700 Insomnia, unspecified: Secondary | ICD-10-CM | POA: Diagnosis not present

## 2014-09-07 DIAGNOSIS — Z981 Arthrodesis status: Secondary | ICD-10-CM | POA: Diagnosis not present

## 2014-09-07 HISTORY — PX: I & D EXTREMITY: SHX5045

## 2014-09-07 SURGERY — IRRIGATION AND DEBRIDEMENT EXTREMITY
Anesthesia: General | Site: Hand | Laterality: Right

## 2014-09-07 MED ORDER — OXYCODONE HCL 5 MG/5ML PO SOLN
5.0000 mg | Freq: Once | ORAL | Status: AC | PRN
Start: 2014-09-07 — End: 2014-09-07

## 2014-09-07 MED ORDER — VANCOMYCIN HCL IN DEXTROSE 1-5 GM/200ML-% IV SOLN
INTRAVENOUS | Status: AC
  Filled 2014-09-07: qty 200

## 2014-09-07 MED ORDER — FENTANYL CITRATE 0.05 MG/ML IJ SOLN
INTRAMUSCULAR | Status: DC | PRN
  Administered 2014-09-07: 100 ug via INTRAVENOUS
  Administered 2014-09-07: 50 ug via INTRAVENOUS

## 2014-09-07 MED ORDER — HYDROMORPHONE HCL 1 MG/ML IJ SOLN
INTRAMUSCULAR | Status: AC
  Filled 2014-09-07: qty 1

## 2014-09-07 MED ORDER — BUPIVACAINE-EPINEPHRINE (PF) 0.5% -1:200000 IJ SOLN
INTRAMUSCULAR | Status: DC | PRN
  Administered 2014-09-07: 9 mL via PERINEURAL

## 2014-09-07 MED ORDER — HYDROMORPHONE HCL 1 MG/ML IJ SOLN
0.2500 mg | INTRAMUSCULAR | Status: DC | PRN
  Administered 2014-09-07 (×4): 0.5 mg via INTRAVENOUS

## 2014-09-07 MED ORDER — BUPIVACAINE-EPINEPHRINE (PF) 0.25% -1:200000 IJ SOLN
INTRAMUSCULAR | Status: AC
  Filled 2014-09-07: qty 30

## 2014-09-07 MED ORDER — VANCOMYCIN HCL IN DEXTROSE 500-5 MG/100ML-% IV SOLN
INTRAVENOUS | Status: AC
  Filled 2014-09-07: qty 100

## 2014-09-07 MED ORDER — LACTATED RINGERS IV SOLN
INTRAVENOUS | Status: DC
  Administered 2014-09-07: 08:00:00 via INTRAVENOUS

## 2014-09-07 MED ORDER — PROPOFOL 10 MG/ML IV BOLUS
INTRAVENOUS | Status: DC | PRN
  Administered 2014-09-07: 270 mg via INTRAVENOUS

## 2014-09-07 MED ORDER — LIDOCAINE HCL (PF) 1 % IJ SOLN
INTRAMUSCULAR | Status: AC
  Filled 2014-09-07: qty 30

## 2014-09-07 MED ORDER — BUPIVACAINE-EPINEPHRINE (PF) 0.5% -1:200000 IJ SOLN
INTRAMUSCULAR | Status: AC
  Filled 2014-09-07: qty 30

## 2014-09-07 MED ORDER — OXYCODONE HCL 5 MG PO TABS
5.0000 mg | ORAL_TABLET | Freq: Once | ORAL | Status: AC | PRN
  Administered 2014-09-07: 5 mg via ORAL

## 2014-09-07 MED ORDER — PROMETHAZINE HCL 25 MG/ML IJ SOLN: 6.2500 mg | INTRAMUSCULAR | Status: DC | PRN

## 2014-09-07 MED ORDER — FENTANYL CITRATE 0.05 MG/ML IJ SOLN: 50.0000 ug | INTRAMUSCULAR | Status: DC | PRN

## 2014-09-07 MED ORDER — MIDAZOLAM HCL 2 MG/2ML IJ SOLN: 1.0000 mg | INTRAMUSCULAR | Status: DC | PRN

## 2014-09-07 MED ORDER — OXYCODONE HCL 5 MG PO TABS
ORAL_TABLET | ORAL | Status: AC
  Filled 2014-09-07: qty 1

## 2014-09-07 MED ORDER — FENTANYL CITRATE 0.05 MG/ML IJ SOLN
INTRAMUSCULAR | Status: AC
  Filled 2014-09-07: qty 6

## 2014-09-07 MED ORDER — LACTATED RINGERS IV SOLN: INTRAVENOUS | Status: DC

## 2014-09-07 MED ORDER — MIDAZOLAM HCL 5 MG/5ML IJ SOLN
INTRAMUSCULAR | Status: DC | PRN
  Administered 2014-09-07: 2 mg via INTRAVENOUS

## 2014-09-07 MED ORDER — MIDAZOLAM HCL 2 MG/2ML IJ SOLN
INTRAMUSCULAR | Status: AC
  Filled 2014-09-07: qty 2

## 2014-09-07 MED ORDER — ONDANSETRON HCL 4 MG/2ML IJ SOLN
INTRAMUSCULAR | Status: DC | PRN
  Administered 2014-09-07: 4 mg via INTRAVENOUS

## 2014-09-07 SURGICAL SUPPLY — 42 items
BANDAGE COBAN STERILE 2 (GAUZE/BANDAGES/DRESSINGS) IMPLANT
BLADE MINI RND TIP GREEN BEAV (BLADE) IMPLANT
BLADE SURG 15 STRL LF DISP TIS (BLADE) ×1 IMPLANT
BLADE SURG 15 STRL SS (BLADE) ×3
BNDG COHESIVE 4X5 TAN STRL (GAUZE/BANDAGES/DRESSINGS) ×3 IMPLANT
BNDG GAUZE ELAST 4 BULKY (GAUZE/BANDAGES/DRESSINGS) ×4 IMPLANT
CHLORAPREP W/TINT 26ML (MISCELLANEOUS) ×1 IMPLANT
CORDS BIPOLAR (ELECTRODE) ×2 IMPLANT
COVER BACK TABLE 60X90IN (DRAPES) ×3 IMPLANT
COVER MAYO STAND STRL (DRAPES) ×3 IMPLANT
CUFF TOURNIQUET SINGLE 18IN (TOURNIQUET CUFF) ×2 IMPLANT
DRAPE EXTREMITY T 121X128X90 (DRAPE) ×3 IMPLANT
DRAPE SURG 17X23 STRL (DRAPES) ×3 IMPLANT
DRSG EMULSION OIL 3X3 NADH (GAUZE/BANDAGES/DRESSINGS) ×3 IMPLANT
GAUZE IODOFORM PACK 1/2 7832 (GAUZE/BANDAGES/DRESSINGS) ×2 IMPLANT
GAUZE SPONGE 4X4 12PLY STRL (GAUZE/BANDAGES/DRESSINGS) ×3 IMPLANT
GLOVE BIO SURGEON STRL SZ7.5 (GLOVE) ×3 IMPLANT
GLOVE BIOGEL PI IND STRL 7.0 (GLOVE) ×1 IMPLANT
GLOVE BIOGEL PI IND STRL 8 (GLOVE) ×1 IMPLANT
GLOVE BIOGEL PI INDICATOR 7.0 (GLOVE) ×2
GLOVE BIOGEL PI INDICATOR 8 (GLOVE) ×2
GLOVE ECLIPSE 6.5 STRL STRAW (GLOVE) ×5 IMPLANT
GOWN STRL REUS W/ TWL LRG LVL3 (GOWN DISPOSABLE) ×1 IMPLANT
GOWN STRL REUS W/TWL LRG LVL3 (GOWN DISPOSABLE) ×9
GOWN STRL REUS W/TWL XL LVL3 (GOWN DISPOSABLE) ×3 IMPLANT
NDL HYPO 25X1 1.5 SAFETY (NEEDLE) IMPLANT
NEEDLE HYPO 25X1 1.5 SAFETY (NEEDLE) ×3 IMPLANT
NS IRRIG 1000ML POUR BTL (IV SOLUTION) ×3 IMPLANT
PACK BASIN DAY SURGERY FS (CUSTOM PROCEDURE TRAY) ×3 IMPLANT
PADDING CAST ABS 4INX4YD NS (CAST SUPPLIES)
PADDING CAST ABS COTTON 4X4 ST (CAST SUPPLIES) IMPLANT
RUBBERBAND STERILE (MISCELLANEOUS) IMPLANT
STOCKINETTE 6  STRL (DRAPES) ×2
STOCKINETTE 6 STRL (DRAPES) ×1 IMPLANT
SUT ETHILON 4 0 PS 2 18 (SUTURE) ×2 IMPLANT
SUT VICRYL RAPIDE 4-0 (SUTURE) IMPLANT
SUT VICRYL RAPIDE 4/0 PS 2 (SUTURE) IMPLANT
SYR BULB 3OZ (MISCELLANEOUS) ×3 IMPLANT
SYRINGE 10CC LL (SYRINGE) IMPLANT
TOWEL OR 17X24 6PK STRL BLUE (TOWEL DISPOSABLE) ×3 IMPLANT
TOWEL OR NON WOVEN STRL DISP B (DISPOSABLE) ×3 IMPLANT
UNDERPAD 30X30 INCONTINENT (UNDERPADS AND DIAPERS) ×3 IMPLANT

## 2014-09-07 NOTE — Anesthesia Postprocedure Evaluation (Signed)
  Anesthesia Post-op Note  Patient: Bryan Huffman  Procedure(s) Performed: Procedure(s): DEBRIDEMENT RIGHT HAND EXTENSOR TENOSYNOVECTOMY (Right)  Patient Location: PACU  Anesthesia Type:General  Level of Consciousness: awake, alert , oriented and patient cooperative  Airway and Oxygen Therapy: Patient Spontanous Breathing  Post-op Pain: none  Post-op Assessment: Post-op Vital signs reviewed, Patient's Cardiovascular Status Stable, Respiratory Function Stable, Patent Airway, No signs of Nausea or vomiting, Adequate PO intake and Pain level controlled  Post-op Vital Signs: Reviewed and stable  Last Vitals:  Filed Vitals:   09/07/14 1130  BP: 131/72  Pulse: 62  Temp: 36.5 C  Resp: 16    Complications: No apparent anesthesia complications

## 2014-09-07 NOTE — Transfer of Care (Signed)
Immediate Anesthesia Transfer of Care Note  Patient: Bryan Huffman  Procedure(s) Performed: Procedure(s): DEBRIDEMENT RIGHT HAND EXTENSOR TENOSYNOVECTOMY (Right)  Patient Location: PACU  Anesthesia Type:General  Level of Consciousness: sedated  Airway & Oxygen Therapy: Patient Spontanous Breathing and Patient connected to face mask oxygen  Post-op Assessment: Report given to PACU RN and Post -op Vital signs reviewed and stable  Post vital signs: Reviewed and stable  Complications: No apparent anesthesia complications

## 2014-09-07 NOTE — Discharge Instructions (Signed)
Discharge Instructions  You have a light dressing on your hand.  You may begin gentle motion of your fingers and hand immediately, but you should not do any heavy lifting or gripping.  Elevate your hand to reduce pain & swelling of the digits.  Ice over the operative site may be helpful to reduce pain & swelling.  DO NOT USE HEAT. Pain medicine has been prescribed for you.  Use your medicine as needed over the first 48 hours, and then you can begin to taper your use. You may use Tylenol in place of your prescribed pain medication, but not IN ADDITION to it. Leave the dressing in place until you return to the office. You may drive a car when you are off of prescription pain medications and can safely control your vehicle with both hands. We will address whether therapy will be required or not when you return to the office.  Please call [phone] during normal business hours or [phone] after hours for any problems. Including the following:  - excessive redness of the incisions - drainage for more than 4 days - fever of more than 101.5 F  *Please note that pain medications will not be refilled after hours or on weekends.  WORK STATUS:  WITH THE RIGHT HAND, NO LIFTING, GRIPPING, GRASPING OR LIFTING MORE THAN 5 POUNDS   Post Anesthesia Home Care Instructions  Activity: Get plenty of rest for the remainder of the day. A responsible adult should stay with you for 24 hours following the procedure.  For the next 24 hours, DO NOT: -Drive a car -Advertising copywriterperate machinery -Drink alcoholic beverages -Take any medication unless instructed by your physician -Make any legal decisions or sign important papers.  Meals: Start with liquid foods such as gelatin or soup. Progress to regular foods as tolerated. Avoid greasy, spicy, heavy foods. If nausea and/or vomiting occur, drink only clear liquids until the nausea and/or vomiting subsides. Call your physician if vomiting continues.  Special  Instructions/Symptoms: Your throat may feel dry or sore from the anesthesia or the breathing tube placed in your throat during surgery. If this causes discomfort, gargle with warm salt water. The discomfort should disappear within 24 hours.

## 2014-09-07 NOTE — Op Note (Signed)
09/07/2014  7:33 AM  PATIENT:  Bryan Huffman  43 y.o. male  PRE-OPERATIVE DIAGNOSIS:  Chronic right hand infection  POST-OPERATIVE DIAGNOSIS:  Same  PROCEDURE:  Excision of sinus tract from dorsum of right hand overlying the third MCP; extensor tenosynovial debridement of the long finger extensor tendon; right long finger MCP arthrotomy with irrigation and synovectomy   SURGEON: Cliffton Astersavid A. Janee Mornhompson, MD  PHYSICIAN ASSISTANT: Danielle RankinKirsten Schrader, OPA-C  ANESTHESIA:  general  SPECIMENS:  To microbiology  DRAINS:   None  PREOPERATIVE INDICATIONS:  Bryan Huffman is a  43 y.o. male with chronic infection of the right hand overlying the third MCP dorsally.  The risks benefits and alternatives were discussed with the patient preoperatively including but not limited to the risks of infection, bleeding, nerve injury, cardiopulmonary complications, the need for revision surgery, among others, and the patient verbalized understanding and consented to proceed.  OPERATIVE IMPLANTS: None  OPERATIVE PROCEDURE:  After withholding prophylactic antibiotics, the patient was escorted to the operative theatre and placed in a supine position.  General anesthesia was administered A surgical "time-out" was performed during which the planned procedure, proposed operative site, and the correct patient identity were compared to the operative consent and agreement confirmed by the circulating nurse according to current facility policy.  Following application of a tourniquet to the operative extremity, the exposed skin was prepped with Betadine and draped in the usual sterile fashion.  The limb was exsanguinated with gravity and the tourniquet inflated to approximately 100mmHg higher than systolic BP.  The sinus tract was elliptically excised sharply with scalpel. This was then extended proximally for about an inch and a half. Skin flaps were developed and retracted radially and ulnarly. There was some fluid  obtained for culture, and there was a large degree of proliferative tenosynovial synovial tissue. After obtaining cultures, 1.5 g of vancomycin was administered. This was debrided sharply with scissors and a Roger. The extensor tendon was intact, but required tenosynovectomy. The tissue along the dorsum of the third metacarpal was excised in the same manner. There was an opening in the MCP capsule dorsally which was further exploited and the synovium was debrided. The chondral surfaces appeared in good condition. There was also thickened proliferative tissue in the plane between the dorsal capsule and the extensor tendon which was further debrided with a rongeur. Satisfied with the degree of debridement, the wound is copiously irrigated and the tourniquet released. Additional hemostasis was obtained and the skin was closed with 4-0 nylon suture, leaving one portion of the wound open and packed with iodoform. Half percent Marcaine with epinephrine was instilled radial ulnar and proximal to the zone of dissection to allow for postoperative pain control. A bulky dressing was applied and he was taken to the recovery room.  DISPOSITION: The patient has an appointment in the morning for PICC line placement and initiation of IV antibiotic treatment, directed by Dr. Orvan Falconerampbell of infectious diseases.

## 2014-09-07 NOTE — Interval H&P Note (Signed)
History and Physical Interval Note:  09/07/2014 9:07 AM  Bryan Huffman  has presented today for surgery, with the diagnosis of RIGHT HAND ABSCESS/OSTEOMYELITIS  The various methods of treatment have been discussed with the patient and family. After consideration of risks, benefits and other options for treatment, the patient has consented to  Procedure(s): DEBRIDEMENT RIGHT HAND  (Right) as a surgical intervention .  The patient's history has been reviewed, patient examined, no change in status, stable for surgery.  I have reviewed the patient's chart and labs.  Questions were answered to the patient's satisfaction.     Antoinetta Berrones A.

## 2014-09-07 NOTE — Telephone Encounter (Signed)
Patient returning Henry ScheinJackie Cockerham's phone call.  He spoke with [name], his IV medication will be delivered between 1 and 2 pm on 12/1.  [facility].  [facility] will be at his home tomorrow for PICC care instruction/demonstration.  (1st dose given in OR 09/07/14). Given appointment for follow up with Dr. Orvan Falconerampbell on 12/9.

## 2014-09-07 NOTE — Anesthesia Preprocedure Evaluation (Addendum)
Anesthesia Evaluation  Patient identified by MRN, date of birth, ID band Patient awake    Reviewed: Allergy & Precautions, H&P , NPO status   History of Anesthesia Complications Negative for: history of anesthetic complications  Airway Mallampati: II   Neck ROM: Limited  Mouth opening: Limited Mouth Opening  Dental  (+) Teeth Intact   Pulmonary Current Smoker,  breath sounds clear to auscultation        Cardiovascular hypertension, Rhythm:Regular Rate:Normal     Neuro/Psych    GI/Hepatic GERD-  ,  Endo/Other    Renal/GU      Musculoskeletal  (+) Arthritis -,   Abdominal (+) + obese,   Peds  Hematology   Anesthesia Other Findings   Reproductive/Obstetrics                            Anesthesia Physical Anesthesia Plan  ASA: II  Anesthesia Plan: General   Post-op Pain Management:    Induction: Intravenous  Airway Management Planned: LMA  Additional Equipment:   Intra-op Plan:   Post-operative Plan: Extubation in OR  Informed Consent: I have reviewed the patients History and Physical, chart, labs and discussed the procedure including the risks, benefits and alternatives for the proposed anesthesia with the patient or authorized representative who has indicated his/her understanding and acceptance.     Plan Discussed with: CRNA and Surgeon  Anesthesia Plan Comments:         Anesthesia Quick Evaluation

## 2014-09-08 ENCOUNTER — Ambulatory Visit (HOSPITAL_COMMUNITY)
Admission: RE | Admit: 2014-09-08 | Discharge: 2014-09-08 | Disposition: A | Discharge status: Home / Self Care | Payer: Medicaid Other | Source: Ambulatory Visit | Attending: Internal Medicine | Admitting: Internal Medicine

## 2014-09-08 ENCOUNTER — Other Ambulatory Visit: Payer: Self-pay | Admitting: Internal Medicine

## 2014-09-08 ENCOUNTER — Encounter (HOSPITAL_BASED_OUTPATIENT_CLINIC_OR_DEPARTMENT_OTHER): Payer: Self-pay | Admitting: Orthopedic Surgery

## 2014-09-08 DIAGNOSIS — M868X4 Other osteomyelitis, hand: Secondary | ICD-10-CM | POA: Diagnosis not present

## 2014-09-08 DIAGNOSIS — M86141 Other acute osteomyelitis, right hand: Secondary | ICD-10-CM

## 2014-09-08 MED ORDER — LIDOCAINE HCL 1 % IJ SOLN
INTRAMUSCULAR | Status: AC
  Filled 2014-09-08: qty 20

## 2014-09-10 LAB — CULTURE, ROUTINE-ABSCESS: CULTURE: NO GROWTH

## 2014-09-12 LAB — ANAEROBIC CULTURE

## 2014-09-15 ENCOUNTER — Telehealth: Payer: Self-pay | Admitting: *Deleted

## 2014-09-15 NOTE — Telephone Encounter (Signed)
Call from Gasper SellsSara King, RN with [facility]; patient's vanc trough was >5.0. She said he has been taking it inconsistently and she has instructed him to move it up an hour each day until he is administering it before 12 pm. Lab results will be faxed and patient has appointment with Dr. Orvan Falconerampbell tomorrow 09/16/14.

## 2014-09-16 ENCOUNTER — Telehealth: Payer: Self-pay | Admitting: *Deleted

## 2014-09-16 ENCOUNTER — Ambulatory Visit (INDEPENDENT_AMBULATORY_CARE_PROVIDER_SITE_OTHER): Payer: Medicaid Other | Admitting: Internal Medicine

## 2014-09-16 ENCOUNTER — Encounter: Payer: Self-pay | Admitting: Internal Medicine

## 2014-09-16 VITALS — BP 119/79 | HR 60 | Temp 97.6°F | Wt 212.8 lb

## 2014-09-16 DIAGNOSIS — M869 Osteomyelitis, unspecified: Secondary | ICD-10-CM

## 2014-09-16 NOTE — Progress Notes (Signed)
Patient ID: Bryan SpiresJody A Huffman, male   DOB: 12-May-1971, 43 y.o.   MRN: [id number]         [facility]  Patient Active Problem List   Diagnosis Date Noted  . Osteomyelitis of hand 09/02/2014    Priority: High  . Polysubstance abuse 09/02/2014  . History of suicidal ideation 09/02/2014  . HTN (hypertension) 09/02/2014  . GERD (gastroesophageal reflux disease) 09/02/2014  . Cigarette smoker 09/02/2014  . History of pancreatitis 09/02/2014  . DDD (degenerative disc disease), lumbar 10/15/2013  . Alcohol dependence 07/26/2013  . Back pain 07/26/2013  . MDD (major depressive disorder) 07/26/2013    Patient's Medications  New Prescriptions   No medications on file  Previous Medications   HYDROCHLOROTHIAZIDE (MICROZIDE) 12.5 MG CAPSULE    Take 1 capsule (12.5 mg total) by mouth daily.   LISINOPRIL (PRINIVIL,ZESTRIL) 10 MG TABLET    Take 1 tablet (10 mg total) by mouth daily.   METOPROLOL SUCCINATE (TOPROL-XL) 100 MG 24 HR TABLET    Take 1 tablet (100 mg total) by mouth daily. Take with or immediately following a meal.   OMEPRAZOLE (PRILOSEC) 20 MG CAPSULE    Take 1 capsule (20 mg total) by mouth daily.   OXYCODONE-ACETAMINOPHEN (PERCOCET/ROXICET) 5-325 MG PER TABLET    Take 1 tablet by mouth every 4 (four) hours as needed for severe pain.   TRAZODONE (DESYREL) 150 MG TABLET    Take 1 tablet (150 mg total) by mouth at bedtime as needed for sleep.  Modified Medications   No medications on file  Discontinued Medications   No medications on file    Subjective: Bryan Huffman is in for his routine follow-up visit. He underwent incision and drainage of his right third MCP joint on November 30. Operative stains and cultures were negative but he had been on antibiotics preoperatively. He has now received 10 days of IV vancomycin. His trough levels have been subtherapeutic and will be adjusted. He is feeling much better. He's had no problems tolerating his PICC or  vancomycin. Review of Systems: Pertinent items are noted in HPI.  Past Medical History  Diagnosis Date  . Hypertension   . Back pain   . Insomnia   . Mental disorder   . Depression   . GERD (gastroesophageal reflux disease)   . Hemorrhoid   . Arthritis     History  Substance Use Topics  . Smoking status: Current Every Day Smoker -- 0.50 packs/day for 18 years    Types: Cigarettes  . Smokeless tobacco: Never Used     Comment: cutting back  . Alcohol Use: 1.2 oz/week    2 Glasses of wine per week     Comment: not much    Family History  Problem Relation Age of Onset  . Hyperlipidemia Father     Allergies  Allergen Reactions  . Bee Venom Swelling    Objective: Temp: 97.6 F (36.4 C) (12/09 1450) Temp Source: Oral (12/09 1450) BP: 119/79 mmHg (12/09 1450) Pulse Rate: 60 (12/09 1450)  General: He is in good spirits Skin: PICC site appears normal but he needs a new dressing The swelling over his right third knuckle has improved; he has good range of motion without pain  Right hand Gram stain, AFB stain and fungal stain 09/07/2014: Negative All cultures remain negative  SED RATE (mm/hr)  Date Value  09/02/2014 11   CRP (mg/dL)  Date Value  78/29/562111/25/2015 <0.5     Assessment: He  is improving on empiric vancomycin for septic arthritis in his right hand.  Plan: 1. A new dressing was placed on his PICC today 2. His vancomycin dose will be adjusted accordingly 3. Follow-up in 2 weeks   Cliffton AstersJohn Klarisa Barman, MD [facility] [phone] pager   [phone] cell 09/16/2014, 3:09 PM

## 2014-09-16 NOTE — Telephone Encounter (Addendum)
Pt's Vanc trough less than 5.  [facility] NOT [facility] under a [facility] protocol.  [facility] pharmacist stated that they would change his vancomycin to 1 gram every 12 hours with an order.  Order given, written order from [facility] will be faxed to [facility] for MD signature.

## 2014-09-23 ENCOUNTER — Telehealth: Payer: Self-pay | Admitting: *Deleted

## 2014-09-23 NOTE — Telephone Encounter (Signed)
Maralyn SagoSarah [phone] [facility]ist [facility] to report that the patient's vanc trough is still low and he appears unwilling to increase it.    Per Maralyn SagoSarah, the patient was upped from 1 gm q24 hours to 1 gm q12.  His resulting vanc trough is still 5.5 (12/14).  Maralyn SagoSarah is trying to get the patient to go to 2 gm/12, but the patient is unwilling.  He states he is feeling side effects and does not want to increase the dose.  Side effects include: "feeling run down since starting vancomycin," urinating every 30-45 minutes for a few hours after each dose, and having to get up a few times a night for urination.  She is asking if there is another alternative based on any culture results.  Please advise.  Andree CossHowell, Dionna Wiedemann M, RN

## 2014-09-23 NOTE — Telephone Encounter (Signed)
Spoke with patient, he will follow recommendations and increase the vancomycin as directed by pharmacy.  Left [facility] pharmacy a message notifying them as well.

## 2014-09-23 NOTE — Telephone Encounter (Signed)
I doubt that his symptoms are related to the vancomycin. Please encourage him to increase his dose as requested until his follow-up visit with me next week.

## 2014-09-25 ENCOUNTER — Encounter: Payer: Self-pay | Admitting: Internal Medicine

## 2014-09-29 ENCOUNTER — Encounter: Payer: Self-pay | Admitting: Internal Medicine

## 2014-09-29 ENCOUNTER — Telehealth: Payer: Self-pay | Admitting: *Deleted

## 2014-09-29 ENCOUNTER — Ambulatory Visit (INDEPENDENT_AMBULATORY_CARE_PROVIDER_SITE_OTHER): Payer: Medicaid Other | Admitting: Internal Medicine

## 2014-09-29 DIAGNOSIS — M869 Osteomyelitis, unspecified: Secondary | ICD-10-CM

## 2014-09-29 MED ORDER — AMOXICILLIN-POT CLAVULANATE 875-125 MG PO TABS: 1.0000 | ORAL_TABLET | Freq: Two times a day (BID) | ORAL | Status: DC

## 2014-09-29 MED ORDER — DOXYCYCLINE HYCLATE 100 MG PO TABS: 100.0000 mg | ORAL_TABLET | Freq: Two times a day (BID) | ORAL | Status: DC

## 2014-09-29 NOTE — Addendum Note (Signed)
Addended by: Jennet MaduroESTRIDGE, Denman Pichardo D on: 09/29/2014 10:17 AM   Modules accepted: Orders

## 2014-09-29 NOTE — Progress Notes (Signed)
Patient ID: Bryan Huffman, male   DOB: [birth year], 43 y.o.   MRN: [id number]         [facility]  Patient Active Problem List   Diagnosis Date Noted  . Osteomyelitis of hand 09/02/2014    Priority: High  . Polysubstance abuse 09/02/2014  . History of suicidal ideation 09/02/2014  . HTN (hypertension) 09/02/2014  . GERD (gastroesophageal reflux disease) 09/02/2014  . Cigarette smoker 09/02/2014  . History of pancreatitis 09/02/2014  . DDD (degenerative disc disease), lumbar 10/15/2013  . Alcohol dependence 07/26/2013  . Back pain 07/26/2013  . MDD (major depressive disorder) 07/26/2013    Patient's Medications  New Prescriptions   AMOXICILLIN-CLAVULANATE (AUGMENTIN) 875-125 MG PER TABLET    Take 1 tablet by mouth 2 (two) times daily.   DOXYCYCLINE (VIBRA-TABS) 100 MG TABLET    Take 1 tablet (100 mg total) by mouth 2 (two) times daily.  Previous Medications   ASPIRIN 81 MG TABLET    Take 81 mg by mouth daily.   HYDROCHLOROTHIAZIDE (MICROZIDE) 12.5 MG CAPSULE    Take 1 capsule (12.5 mg total) by mouth daily.   LISINOPRIL (PRINIVIL,ZESTRIL) 10 MG TABLET    Take 1 tablet (10 mg total) by mouth daily.   METOPROLOL SUCCINATE (TOPROL-XL) 100 MG 24 HR TABLET    Take 1 tablet (100 mg total) by mouth daily. Take with or immediately following a meal.   OMEPRAZOLE (PRILOSEC) 20 MG CAPSULE    Take 1 capsule (20 mg total) by mouth daily.   OXYCODONE-ACETAMINOPHEN (PERCOCET/ROXICET) 5-325 MG PER TABLET    Take 1 tablet by mouth every 4 (four) hours as needed for severe pain.   SIMVASTATIN (ZOCOR) 10 MG TABLET    Take 10 mg by mouth daily.   TRAZODONE (DESYREL) 150 MG TABLET    Take 1 tablet (150 mg total) by mouth at bedtime as needed for sleep.  Modified Medications   No medications on file  Discontinued Medications   VANCOMYCIN 1,000 MG IN SODIUM CHLORIDE 0.9 % 250 ML    Inject 2,000 mg into the vein every 12 (twelve) hours.     Subjective: Augusto GambleJody is in for  his routine follow-up visit. He is now completed 23 days of vancomycin for his culture negative right third MCP septic arthritis and osteomyelitis. Unfortunately his vancomycin levels have been subtherapeutic throughout his course despite him taking it twice daily. All operative stains and cultures were negative from his surgery on November 30. He states that he is feeling better.  Review of Systems: Pertinent items are noted in HPI.  Past Medical History  Diagnosis Date  . Hypertension   . Back pain   . Insomnia   . Mental disorder   . Depression   . GERD (gastroesophageal reflux disease)   . Hemorrhoid   . Arthritis     History  Substance Use Topics  . Smoking status: Current Every Day Smoker -- 0.50 packs/day for 18 years    Types: Cigarettes  . Smokeless tobacco: Never Used     Comment: cutting back  . Alcohol Use: No     Comment: none since 09/02/14    Family History  Problem Relation Age of Onset  . Hyperlipidemia Father     Allergies  Allergen Reactions  . Bee Venom Swelling    Objective: Temp: 98.5 F (36.9 C) (12/22 0917) Temp Source: Oral (12/22 0917) BP: 128/82 mmHg (12/22 0917) Pulse Rate: 72 (12/22 0917)  General: He is  in good spirits Skin: Left arm PICC site appears normal He has no more erythema but still has some swelling over the third MCP joint. His incision remained slightly open but without drainage.   Assessment: I will discontinue vancomycin, have the PICC removed and switch him to oral doxycycline and Augmentin.  Plan: 1. Change vancomycin to oral doxycycline and Augmentin 2. Have PICC removed 3. Follow-up in 4 weeks   Cliffton AstersJohn Noreta Kue, MD [facility] [phone] pager   [phone] cell 09/29/2014, 9:50 AM

## 2014-09-29 NOTE — Progress Notes (Signed)
RN received verbal order to discontinue the patient's PICC line.  Patient identified with name and date of birth. PICC dressing removed, site unremarkable.  Sutures removed without difficulty.  PICC line removed using sterile procedure @ 1030. PICC length equal to that noted in patient's hospital chart of 45 cm. Sterile petroleum gauze + sterile 4X4 applied to PICC site, pressure applied for 10 minutes and covered with Medipore tape as a pressure dressing. Patient tolerated procedure without complaints.  Patient instructed to limit use of arm for 1 hour. Patient instructed that the pressure dressing should remain in place for 24 hours. Patient verbalized understanding of these instructions.

## 2014-09-29 NOTE — Telephone Encounter (Signed)
[facility] that patient's picc line was pulled here in the clinic. She will notify [facility] nursing and let them know patient has been started on oral antibiotics. Wendall MolaJacqueline Cockerham

## 2014-10-02 LAB — FUNGUS CULTURE W SMEAR: Fungal Smear: NONE SEEN

## 2014-10-07 ENCOUNTER — Encounter: Payer: Self-pay | Admitting: Internal Medicine

## 2014-10-12 ENCOUNTER — Encounter: Payer: Self-pay | Admitting: Internal Medicine

## 2014-10-13 ENCOUNTER — Telehealth: Payer: Self-pay | Admitting: Licensed Clinical Social Worker

## 2014-10-13 NOTE — Telephone Encounter (Signed)
Please schedule him to see me on Thursday, 10/15/2014. I discussed the situation again with his surgeon, Dr. Mack Hookavid Thompson today. He will evaluate him again. If there is a new pocket of fluid he will attempt aspiration. I suggested submitting cultures for routine Gram stain and culture as well as AFB stain and culture.

## 2014-10-13 NOTE — Telephone Encounter (Signed)
Patient is scheduled for Thursday

## 2014-10-13 NOTE — Telephone Encounter (Signed)
Spoke with Dr. Carollee Massedhompson's office and a message will be sent back to him to please try and work in patient today to be evaluated. Let Trong know this and informed him of appointment with Dr. Orvan Falconerampbell for 10/15/14 at 11:30 AM. Bryan MolaJacqueline Cockerham

## 2014-10-13 NOTE — Telephone Encounter (Signed)
Please ask him to let Dr. Janee Mornhompson no about the problems he is having with his finger and add him onto my schedule on Thursday, January 7.

## 2014-10-13 NOTE — Telephone Encounter (Signed)
Patient called stating that he was in a lot of pain in his third finger on right hand. Patient stated he "just doesn't feel good" [facility] got on the phone and stated that his finger is swollen and she can feel fluid above and on the joint. Please advise

## 2014-10-15 ENCOUNTER — Encounter: Payer: Self-pay | Admitting: Internal Medicine

## 2014-10-15 ENCOUNTER — Ambulatory Visit (INDEPENDENT_AMBULATORY_CARE_PROVIDER_SITE_OTHER): Payer: Medicaid Other | Admitting: Internal Medicine

## 2014-10-15 VITALS — BP 136/87 | HR 65 | Temp 97.8°F | Wt 221.0 lb

## 2014-10-15 DIAGNOSIS — M869 Osteomyelitis, unspecified: Secondary | ICD-10-CM

## 2014-10-15 NOTE — Progress Notes (Signed)
Patient ID: Bryan Huffman, male   DOB: 1971/09/23, 44 y.o.   MRN: [id number]         [facility]  Patient Active Problem List   Diagnosis Date Noted  . Osteomyelitis of hand 09/02/2014    Priority: High  . Polysubstance abuse 09/02/2014  . History of suicidal ideation 09/02/2014  . HTN (hypertension) 09/02/2014  . GERD (gastroesophageal reflux disease) 09/02/2014  . Cigarette smoker 09/02/2014  . History of pancreatitis 09/02/2014  . DDD (degenerative disc disease), lumbar 10/15/2013  . Alcohol dependence 07/26/2013  . Back pain 07/26/2013  . MDD (major depressive disorder) 07/26/2013    Patient's Medications  New Prescriptions   No medications on file  Previous Medications   AMOXICILLIN-CLAVULANATE (AUGMENTIN) 875-125 MG PER TABLET    Take 1 tablet by mouth 2 (two) times daily.   ASPIRIN 81 MG TABLET    Take 81 mg by mouth daily.   DOXYCYCLINE (VIBRA-TABS) 100 MG TABLET    Take 1 tablet (100 mg total) by mouth 2 (two) times daily.   HYDROCHLOROTHIAZIDE (MICROZIDE) 12.5 MG CAPSULE    Take 1 capsule (12.5 mg total) by mouth daily.   LISINOPRIL (PRINIVIL,ZESTRIL) 10 MG TABLET    Take 1 tablet (10 mg total) by mouth daily.   METOPROLOL SUCCINATE (TOPROL-XL) 100 MG 24 HR TABLET    Take 1 tablet (100 mg total) by mouth daily. Take with or immediately following a meal.   OMEPRAZOLE (PRILOSEC) 20 MG CAPSULE    Take 1 capsule (20 mg total) by mouth daily.   OXYCODONE-ACETAMINOPHEN (PERCOCET/ROXICET) 5-325 MG PER TABLET    Take 1 tablet by mouth every 4 (four) hours as needed for severe pain.   SIMVASTATIN (ZOCOR) 10 MG TABLET    Take 10 mg by mouth daily.   TRAZODONE (DESYREL) 150 MG TABLET    Take 1 tablet (150 mg total) by mouth at bedtime as needed for sleep.  Modified Medications   No medications on file  Discontinued Medications   No medications on file    Subjective: Bryan Huffman is a 44 y.o. male who fell while working in his garage 2 and  half months ago. He hit his right hand on the concrete floor and noted bleeding over the third knuckle. He washed his hand and clean the wound a few hours later when he was through working. It seemed to be healing well until about 1-1/2 months ago when he onset of painful swelling and redness over the dorsum of his hand. He was seen by his primary care provider and started on cephalexin. Within 24 hours his swelling and redness was markedly worse so he went to [facility] emergency department. The physician there attempted to aspirate the swollen area over the knuckle. He recalls that only maybe a drop of fluid was obtained. I did a swab culture of drainage. I called the lab and the Gram stain showed a few white blood cells but no organisms and aerobic and anaerobic cultures were negative. He received an IV antibiotic in the emergency department and was discharged on oral clindamycin which he took for 2 weeks. The more diffuse swelling and redness improved but he is continued to have painful swelling over the third knuckle with intermittent drainage of clear yellow fluid. He underwent an MRI scan  on 09/01/2014 which showed effusion of the third MCP joint with patchy marrow edema and small marginal erosion of the distal third metacarpal. There is also  extensive periarticular edema. There was a heterogeneous fluid collection over the dorsal extensor tendon sheath compatible with abscess.  Dr. Mack Hook performed incision and drainage on 09/07/2014. Operative findings revealed deep tenosynovitis and involvement of the third MCP joint. Chondral surfaces of bone appeared healthy. He was started on IV vancomycin postoperatively. Operative stains and cultures were negative. He received vancomycin for 23 days but we were never able to get his serum levels in the therapeutic range. There was concern that he was not taking his doses correctly although he stated that he was. When I saw him on  09/29/2014 made decision to switch him from IV vancomycin to oral doxycycline and amoxicillin clavulanate and have his PICC removed.  He called 2 days ago after he and his home nurse became concerned that he was having increasing swelling. He has also noted increasing pain over the last several days. He saw Dr. Janee Morn yesterday who felt that the swelling and induration around the surgical incision was normal. Bryan Huffman states that he has had increased swelling and pain in the last 24 hours. He denies any fever, chills or sweats.  Review of Systems: Pertinent items are noted in HPI.  Past Medical History  Diagnosis Date  . Hypertension   . Back pain   . Insomnia   . Mental disorder   . Depression   . GERD (gastroesophageal reflux disease)   . Hemorrhoid   . Arthritis     History  Substance Use Topics  . Smoking status: Current Every Day Smoker -- 0.50 packs/day for 18 years    Types: Cigarettes  . Smokeless tobacco: Never Used     Comment: cutting back  . Alcohol Use: No     Comment: none since 09/02/14    Family History  Problem Relation Age of Onset  . Hyperlipidemia Father     Allergies  Allergen Reactions  . Bee Venom Swelling    Objective: Temp: 97.8 F (36.6 C) (01/07 1129) Temp Source: Oral (01/07 1129) BP: 136/87 mmHg (01/07 1129) Pulse Rate: 65 (01/07 1129)  General: He appears worried and somewhat uncomfortable due to pain in his hand Right hand: He has a healing incision on the dorsum of his hand over the right third knuckle. There is one central area that is not completely healed but there is no expressible drainage. There is definitely increased swelling since I saw him 2-1/2 weeks ago and it is tender to palpation. There is slight erythema surrounding the incision  Lab Results Lab Results  Component Value Date   WBC 7.2 09/02/2014   HGB 13.9 09/02/2014   HCT 40.5 09/02/2014   MCV 82.7 09/02/2014   PLT 280 09/02/2014   BMET    Component  Value Date/Time   NA 136 09/02/2014 1334   K 4.9 09/02/2014 1334   CL 100 09/02/2014 1334   CO2 26 09/02/2014 1334   GLUCOSE 88 09/02/2014 1334   BUN 15 09/02/2014 1334   CREATININE 0.67 09/02/2014 1334   CREATININE 0.73 01/27/2014 0926   CALCIUM 9.7 09/02/2014 1334   GFRNONAA >90 01/27/2014 0926   GFRAA >90 01/27/2014 0926   SED RATE (mm/hr)  Date Value  09/02/2014 11   CRP (mg/dL)  Date Value  16/07/9603 <0.5     Assessment: He appears to be getting worse since switching to oral antibiotics. The situation is very difficult because we do not have a pathogen to target and he had a great deal of difficulty with outpatient IV  vancomycin. I discussed the situation with Dr. Janee Mornhompson again today. He agrees to see him in the office this afternoon and reconsider repeat incision and drainage. If he does have repeat surgery I asked Dr. Janee Mornhompson to send specimens for routine stains and cultures as well as AFB stains and cultures in case he is infected with an atypical mycobacterium. Once a decision is been made about repeat surgery I will consider switching him to intermittent dosing of IV oritavancin. This would provide therapeutic levels for all staph, including MRSA, and strep for up to 3 weeks and avoid the problems that he had with home dosing of IV vancomycin on a daily basis.  Plan: 1. Continue doxycycline and amoxicillin clavulanate for now 2. He will follow-up with Dr. Janee Mornhompson this afternoon 3. Once a decision is made on repeat incision and drainage wall make a decision about change in antibiotic therapy   Cliffton AstersJohn Anginette Espejo, MD [facility] [phone] pager   [phone] cell 10/15/2014, 12:06 PM

## 2014-10-16 ENCOUNTER — Ambulatory Visit (HOSPITAL_COMMUNITY)
Admission: RE | Admit: 2014-10-16 | Discharge: 2014-10-16 | Disposition: A | Discharge status: Home / Self Care | Payer: Medicaid Other | Source: Ambulatory Visit | Attending: Internal Medicine | Admitting: Internal Medicine

## 2014-10-16 DIAGNOSIS — K219 Gastro-esophageal reflux disease without esophagitis: Secondary | ICD-10-CM | POA: Diagnosis not present

## 2014-10-16 DIAGNOSIS — F17210 Nicotine dependence, cigarettes, uncomplicated: Secondary | ICD-10-CM | POA: Insufficient documentation

## 2014-10-16 DIAGNOSIS — Z7982 Long term (current) use of aspirin: Secondary | ICD-10-CM | POA: Diagnosis not present

## 2014-10-16 DIAGNOSIS — X58XXXD Exposure to other specified factors, subsequent encounter: Secondary | ICD-10-CM | POA: Insufficient documentation

## 2014-10-16 DIAGNOSIS — Z79899 Other long term (current) drug therapy: Secondary | ICD-10-CM | POA: Diagnosis not present

## 2014-10-16 DIAGNOSIS — Z792 Long term (current) use of antibiotics: Secondary | ICD-10-CM | POA: Diagnosis not present

## 2014-10-16 DIAGNOSIS — Z9889 Other specified postprocedural states: Secondary | ICD-10-CM | POA: Insufficient documentation

## 2014-10-16 DIAGNOSIS — I10 Essential (primary) hypertension: Secondary | ICD-10-CM | POA: Diagnosis not present

## 2014-10-16 DIAGNOSIS — T1490 Injury, unspecified: Secondary | ICD-10-CM | POA: Diagnosis not present

## 2014-10-16 DIAGNOSIS — M868X4 Other osteomyelitis, hand: Secondary | ICD-10-CM | POA: Insufficient documentation

## 2014-10-16 MED ORDER — ORITAVANCIN DIPHOSPHATE 400 MG IV SOLR
1200.0000 mg | Freq: Once | INTRAVENOUS | Status: AC
  Administered 2014-10-16: 1200 mg via INTRAVENOUS
  Filled 2014-10-16: qty 120

## 2014-10-16 NOTE — Procedures (Signed)
[facility]  Procedure Note  Bryan Huffman ZOX:[id number] DOB: 15-Apr-1971 DOA: 10/16/2014   PCP: Dema SeverinYORK,REGINA F, NP   Associated Diagnosis: Osteomyelitis of hand  Procedure Note: Infusion of Oritavancin over 3 hours   Condition During Procedure:  Pt tolerated well   Condition at Discharge:  Pt alert, ambulatory, oriented; IV removed with no complications noted   [name], [name], RN  [facility]

## 2014-10-19 ENCOUNTER — Telehealth: Payer: Self-pay | Admitting: Licensed Clinical Social Worker

## 2014-10-19 MED ORDER — ORITAVANCIN DIPHOSPHATE 400 MG IV SOLR: 1200.0000 mg | Freq: Once | INTRAVENOUS | Status: DC

## 2014-10-19 NOTE — Telephone Encounter (Signed)
Patient called stating that he had hand surgery on Friday and [facility]. Patient wanted to know if still needs to take the oral antibiotics. Please advise

## 2014-10-19 NOTE — Telephone Encounter (Signed)
Advised the patient to stop taking

## 2014-10-19 NOTE — Telephone Encounter (Signed)
Please tell him to stop taking his doxycycline and amoxicillin clavulanate.

## 2014-10-19 NOTE — Addendum Note (Signed)
Addended by: Cliffton AstersAMPBELL, Kedra Mcglade on: 10/19/2014 01:09 PM   Modules accepted: Orders, Medications

## 2014-10-20 LAB — AFB CULTURE WITH SMEAR (NOT AT ARMC): Acid Fast Smear: NONE SEEN

## 2014-10-28 ENCOUNTER — Telehealth: Payer: Self-pay | Admitting: *Deleted

## 2014-10-28 NOTE — Telephone Encounter (Signed)
He received a dose of oritavancin after his second I&D on 10/16/2014. Unfortunately operative specimens from that procedure never made it to the laboratory for stains or cultures so I am still having to rely on empiric antibiotic therapy. He may need another dose of oritavancin soon as well as additional, broader antibiotic therapy.

## 2014-10-28 NOTE — Telephone Encounter (Signed)
Patient called to advise that his hand is still very swollen and some to the stitches have popped and there is a lot of smelly drainage. He advised he is on his way to the ortho doctor office for an appt this morning at 915. He advised that when he called them yesterday he was told that he would need to see us about the drainage but they could look at it and remove the remaining stitches if possible. Advised the patient Dr Orvan Falconerampbell is not in the office today but we have an appt with Dr Daiva EvesVan Dam at 10 am. He advised he will go to the ortho appt and call us back if he needs to be seen. Advised him we have limited available appts but he can call back and we can see what we can do.

## 2014-10-29 ENCOUNTER — Ambulatory Visit: Payer: Self-pay | Admitting: Internal Medicine

## 2014-11-05 ENCOUNTER — Encounter: Payer: Self-pay | Admitting: Internal Medicine

## 2014-11-05 ENCOUNTER — Ambulatory Visit (INDEPENDENT_AMBULATORY_CARE_PROVIDER_SITE_OTHER): Payer: Medicaid Other | Admitting: Internal Medicine

## 2014-11-05 DIAGNOSIS — M869 Osteomyelitis, unspecified: Secondary | ICD-10-CM

## 2014-11-05 LAB — BASIC METABOLIC PANEL
BUN: 16 mg/dL (ref 6–23)
CO2: 21 mEq/L (ref 19–32)
Calcium: 9.4 mg/dL (ref 8.4–10.5)
Chloride: 103 mEq/L (ref 96–112)
Creat: 0.75 mg/dL (ref 0.50–1.35)
Glucose, Bld: 80 mg/dL (ref 70–99)
POTASSIUM: 4.6 meq/L (ref 3.5–5.3)
Sodium: 136 mEq/L (ref 135–145)

## 2014-11-05 LAB — CBC
HCT: 40.7 % (ref 39.0–52.0)
HEMOGLOBIN: 13.8 g/dL (ref 13.0–17.0)
MCH: 28 pg (ref 26.0–34.0)
MCHC: 33.9 g/dL (ref 30.0–36.0)
MCV: 82.7 fL (ref 78.0–100.0)
MPV: 9.7 fL (ref 8.6–12.4)
Platelets: 346 10*3/uL (ref 150–400)
RBC: 4.92 MIL/uL (ref 4.22–5.81)
RDW: 14.8 % (ref 11.5–15.5)
WBC: 9.1 10*3/uL (ref 4.0–10.5)

## 2014-11-05 LAB — C-REACTIVE PROTEIN

## 2014-11-05 MED ORDER — CIPROFLOXACIN HCL 500 MG PO TABS: 500.0000 mg | ORAL_TABLET | Freq: Two times a day (BID) | ORAL | Status: DC

## 2014-11-05 NOTE — Progress Notes (Signed)
Patient ID: Bryan Huffman, male   DOB: May 20, 1971, 44 y.o.   MRN: [id number]         [facility]  Patient Active Problem List   Diagnosis Date Noted  . Osteomyelitis of hand 09/02/2014    Priority: High  . Polysubstance abuse 09/02/2014  . History of suicidal ideation 09/02/2014  . HTN (hypertension) 09/02/2014  . GERD (gastroesophageal reflux disease) 09/02/2014  . Cigarette smoker 09/02/2014  . History of pancreatitis 09/02/2014  . DDD (degenerative disc disease), lumbar 10/15/2013  . Alcohol dependence 07/26/2013  . Back pain 07/26/2013  . MDD (major depressive disorder) 07/26/2013    Patient's Medications  New Prescriptions   CIPROFLOXACIN (CIPRO) 500 MG TABLET    Take 1 tablet (500 mg total) by mouth 2 (two) times daily.  Previous Medications   ASPIRIN 81 MG TABLET    Take 81 mg by mouth daily.   HYDROCHLOROTHIAZIDE (MICROZIDE) 12.5 MG CAPSULE    Take 1 capsule (12.5 mg total) by mouth daily.   LISINOPRIL (PRINIVIL,ZESTRIL) 10 MG TABLET    Take 1 tablet (10 mg total) by mouth daily.   METOPROLOL SUCCINATE (TOPROL-XL) 100 MG 24 HR TABLET    Take 1 tablet (100 mg total) by mouth daily. Take with or immediately following a meal.   OMEPRAZOLE (PRILOSEC) 20 MG CAPSULE    Take 1 capsule (20 mg total) by mouth daily.   OXYCODONE-ACETAMINOPHEN (PERCOCET/ROXICET) 5-325 MG PER TABLET    Take 1 tablet by mouth every 4 (four) hours as needed for severe pain.   SIMVASTATIN (ZOCOR) 10 MG TABLET    Take 10 mg by mouth daily.   TRAZODONE (DESYREL) 150 MG TABLET    Take 1 tablet (150 mg total) by mouth at bedtime as needed for sleep.  Modified Medications   No medications on file  Discontinued Medications   ORITAVANCIN DIPHOSPHATE (ORBACTIV) 400 MG SOLR    Inject 1,200 mg into the vein once.    Subjective: Bryan Huffman is in for his routine follow-up visit for his right hand infection. He continues to do poorly. He first injured his hand last October. He is been  treated with a variety of different empiric antibiotics including cephalexin, clindamycin, vancomycin, amoxicillin clavulanate, doxycycline and most recently IV oritavancin after his second incision and drainage procedure on 10/16/2014. His stitches were removed by Dr. Janee Morn last week. He states that the more proximal portion of the wound over his right third knuckle has opened up more and is draining a large amount of clear fluid. He is still having quite a bit of pain. He has not had any fever, chills or sweats. Review of Systems: Pertinent items are noted in HPI.  Past Medical History  Diagnosis Date  . Hypertension   . Back pain   . Insomnia   . Mental disorder   . Depression   . GERD (gastroesophageal reflux disease)   . Hemorrhoid   . Arthritis     History  Substance Use Topics  . Smoking status: Current Every Day Smoker -- 0.50 packs/day for 18 years    Types: Cigarettes  . Smokeless tobacco: Never Used     Comment: cutting back  . Alcohol Use: No     Comment: none since 09/02/14    Family History  Problem Relation Age of Onset  . Hyperlipidemia Father     Allergies  Allergen Reactions  . Bee Venom Swelling    Objective: Temp: 98 F (36.7 C) (01/28  1011) Temp Source: Oral (01/28 1011) BP: 153/82 mmHg (01/28 1011) Pulse Rate: 89 (01/28 1011)  General: He is frustrated but otherwise in no distress Right hand: She continues to have boggy swelling over the dorsum of his right hand. 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 Unfortunately, operative specimens from his most recent surgery a few weeks ago never made it to the lab and all previous cultures and stains have been negative.  Plan: 1. Swab culture of wound drainage obtained today 2. Start ciprofloxacin 3. Follow-up  in one week   Bryan AstersJohn Davon Abdelaziz, MD [facility] [phone] pager   [phone] cell 11/05/2014, 10:33 AM

## 2014-11-05 NOTE — Addendum Note (Signed)
Addended by: Lurlean LeydenPOOLE, TRAVIS F on: 11/05/2014 02:11 PM   Modules accepted: Orders

## 2014-11-06 LAB — SEDIMENTATION RATE: Sed Rate: 11 mm/hr (ref 0–16)

## 2014-11-08 LAB — WOUND CULTURE
GRAM STAIN: NONE SEEN
GRAM STAIN: NONE SEEN
Organism ID, Bacteria: NO GROWTH

## 2014-11-12 ENCOUNTER — Ambulatory Visit (INDEPENDENT_AMBULATORY_CARE_PROVIDER_SITE_OTHER): Payer: Medicaid Other | Admitting: Internal Medicine

## 2014-11-12 ENCOUNTER — Encounter: Payer: Self-pay | Admitting: Internal Medicine

## 2014-11-12 DIAGNOSIS — M869 Osteomyelitis, unspecified: Secondary | ICD-10-CM

## 2014-11-12 NOTE — Progress Notes (Signed)
Patient ID: Bryan Huffman, male   DOB: 1971-08-06, 44 y.o.   MRN: [id number]         [facility]  Patient Active Problem List   Diagnosis Date Noted  . Osteomyelitis of hand 09/02/2014    Priority: High  . Polysubstance abuse 09/02/2014  . History of suicidal ideation 09/02/2014  . HTN (hypertension) 09/02/2014  . GERD (gastroesophageal reflux disease) 09/02/2014  . Cigarette smoker 09/02/2014  . History of pancreatitis 09/02/2014  . DDD (degenerative disc disease), lumbar 10/15/2013  . Alcohol dependence 07/26/2013  . Back pain 07/26/2013  . MDD (major depressive disorder) 07/26/2013    Patient's Medications  New Prescriptions   No medications on file  Previous Medications   ASPIRIN 81 MG TABLET    Take 81 mg by mouth daily.   HYDROCHLOROTHIAZIDE (MICROZIDE) 12.5 MG CAPSULE    Take 1 capsule (12.5 mg total) by mouth daily.   LISINOPRIL (PRINIVIL,ZESTRIL) 10 MG TABLET    Take 1 tablet (10 mg total) by mouth daily.   METOPROLOL SUCCINATE (TOPROL-XL) 100 MG 24 HR TABLET    Take 1 tablet (100 mg total) by mouth daily. Take with or immediately following a meal.   OMEPRAZOLE (PRILOSEC) 20 MG CAPSULE    Take 1 capsule (20 mg total) by mouth daily.   OXYCODONE-ACETAMINOPHEN (PERCOCET/ROXICET) 5-325 MG PER TABLET    Take 1 tablet by mouth every 4 (four) hours as needed for severe pain.   SIMVASTATIN (ZOCOR) 10 MG TABLET    Take 10 mg by mouth daily.   TRAZODONE (DESYREL) 150 MG TABLET    Take 1 tablet (150 mg total) by mouth at bedtime as needed for sleep.  Modified Medications   No medications on file  Discontinued Medications   CIPROFLOXACIN (CIPRO) 500 MG TABLET    Take 1 tablet (500 mg total) by mouth 2 (two) times daily.    Subjective: Abrahan is in for his routine follow-up for his smoldering right hand infection. 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 Previous oral regimens have included cephalexin, clindamycin, amoxicillin clavulanate, doxycycline and now ciprofloxacin. He is also been treated with IV vancomycin and received 1 dose of IV oritavancin. He has undergone 2 incision and drainage procedures. All stains and cultures have been negative to date. He has not noted any improvement since starting ciprofloxacin one week ago. He feels like the swelling has increased in the last 48 hours. He's not had any fever, chills or sweats.  Review of Systems: Pertinent items are noted in HPI.  Past Medical History  Diagnosis Date  . Hypertension   . Back pain   . Insomnia   . Mental disorder   . Depression   . GERD (gastroesophageal reflux disease)   . Hemorrhoid   . Arthritis     History  Substance Use Topics  . Smoking status: Current Every Day Smoker -- 0.50 packs/day for 18 years    Types: Cigarettes  . Smokeless tobacco: Never Used     Comment: cutting back  . Alcohol Use: No     Comment: none since 09/02/14    Family History  Problem Relation Age of Onset  . Hyperlipidemia Father     Allergies  Allergen Reactions  . Bee Venom Swelling    Objective: Temp: 98.2 F (36.8 C) (02/04 1523) Temp Source: Oral (02/04 1523) BP: 125/83  mmHg (02/04 1523) Pulse Rate: 90 (02/04 1523)  General: He is frustrated Right hand: The swelling over the dorsum of his right hand is unchanged. There is some overlying erythema. He has a chronic open wound that probes deep that is about 10 cm in diameter. The tissue is yellow and there is thin watery drainage expressible.  Right hand wound culture 11/05/2014. Moderate WBCs but no organisms on Gram stain. Cultures final and negative.  Lab Results  Component Value Date   WBC 9.1 11/05/2014   HGB 13.8 11/05/2014   HCT 40.7 11/05/2014   MCV 82.7 11/05/2014   PLT 346 11/05/2014   BMET    Component Value Date/Time   NA 136 11/05/2014 1137   K 4.6  11/05/2014 1137   CL 103 11/05/2014 1137   CO2 21 11/05/2014 1137   GLUCOSE 80 11/05/2014 1137   BUN 16 11/05/2014 1137   CREATININE 0.75 11/05/2014 1137   CREATININE 0.73 01/27/2014 0926   CALCIUM 9.4 11/05/2014 1137   GFRNONAA >90 01/27/2014 0926   GFRAA >90 01/27/2014 0926   SED RATE (mm/hr)  Date Value  11/05/2014 11  09/02/2014 11   CRP (mg/dL)  Date Value  16/10/960401/28/2016 <0.5  09/02/2014 <0.5      Assessment: His right hand infection shown little improvement despite aggressive and prolonged therapy including 2 surgeries and multiple different empiric antibiotic regimens. I will obtain a repeat MRI. I have asked him to stop ciprofloxacin now will observe him off of antibiotics pending the results of the MRI scan.  Plan: 1. Discontinue ciprofloxacin and observe off of antibiotics 2. Repeat right hand MRI   Cliffton AstersJohn Taleisha Kaczynski, MD [facility] [phone] pager   [phone] cell 11/12/2014, 3:44 PM

## 2014-11-18 ENCOUNTER — Other Ambulatory Visit: Payer: Self-pay | Admitting: *Deleted

## 2014-11-18 ENCOUNTER — Telehealth: Payer: Self-pay | Admitting: *Deleted

## 2014-11-18 DIAGNOSIS — M869 Osteomyelitis, unspecified: Secondary | ICD-10-CM

## 2014-11-18 NOTE — Telephone Encounter (Signed)
I will repeat his palin x-rays first.

## 2014-11-18 NOTE — Telephone Encounter (Signed)
Denial of right hand MRI request from Medicaid.  Started request for reevaluation of MRI denial.  Gave additional information for consideration.  Answer will be available by the close of business 11/19/14.

## 2014-11-19 ENCOUNTER — Other Ambulatory Visit: Payer: Self-pay | Admitting: Internal Medicine

## 2014-11-19 DIAGNOSIS — M869 Osteomyelitis, unspecified: Secondary | ICD-10-CM

## 2014-11-20 ENCOUNTER — Ambulatory Visit
Admission: RE | Admit: 2014-11-20 | Discharge: 2014-11-20 | Disposition: A | Discharge status: Home / Self Care | Payer: Medicaid Other | Source: Ambulatory Visit | Attending: Internal Medicine | Admitting: Internal Medicine

## 2014-11-20 DIAGNOSIS — M869 Osteomyelitis, unspecified: Secondary | ICD-10-CM

## 2014-11-24 ENCOUNTER — Other Ambulatory Visit: Payer: Self-pay | Admitting: *Deleted

## 2014-11-24 ENCOUNTER — Telehealth: Payer: Self-pay | Admitting: *Deleted

## 2014-11-24 NOTE — Telephone Encounter (Signed)
Bryan Huffman was able to get the MRI approved, do you want this scheduled?

## 2014-11-24 NOTE — Telephone Encounter (Signed)
Patient is calling for results of his hand xray. He states he is in severe pain and it is still draining yellow fluid. MRI has been denied; please advise. Patient states ok to leave a voice mail. Wendall MolaJacqueline Cockerham

## 2014-11-24 NOTE — Telephone Encounter (Addendum)
Plain films done on Fri., Feb 12th.  Looked up whether the MRI was approved.  Medicaid denied the MRI of the right hand again after reevaluation.

## 2014-11-24 NOTE — Telephone Encounter (Addendum)
RIGHT HAND - 2 VIEW 11/20/2014  COMPARISON: Plain films of the right hand 08/03/2014.  FINDINGS: Bone scan defect is seen over the dorsum of the hand at the level of the MCP joints. No radiopaque foreign body or bony destructive change is identified. There is no fracture or dislocation.  IMPRESSION: Skin wound and soft tissue swelling over the dorsum of the hand without underlying foreign body or evidence of osteomyelitis.   Electronically Signed  By: Drusilla Kannerhomas Dalessio M.D.  On: 11/20/2014 14:06  Please let [name] know that the x-rays did not reveal any any evidence of deep bone infection. I would like to keep him off of antibiotics for now and have him work on local wound care. I would like to see him back in clinic in 2 weeks.  Cliffton AstersJohn Ajaya Crutchfield, MD [facility] [phone] pager   [phone] cell 11/24/2014, 3:16 PM

## 2014-11-24 NOTE — Telephone Encounter (Signed)
MRI approval # [id number] and it is good 11/23/14 until 12/24/14 if you change your mind. The MRI was denied initially due to Dr Orvan Falconerampbell being listed as a Cardiologist and plain film. While I was waiting for the patient to have his plain film someone resubmitted his the claim without it. After being on the phone for 45 mins was able to get it approved. We can hold until he sees you if you would like and get it in a couple of weeks if you change your mind as long as it is prior to 12/24/14. The patient has an appt for 12/10/14 at 4 pm. Advised him to call with any questions.

## 2014-11-24 NOTE — Telephone Encounter (Signed)
I will hold off on making a decision about repeat MRI until I reevaluate him in 2 weeks.

## 2014-11-25 NOTE — Telephone Encounter (Signed)
Patient is aware not happy but aware.

## 2014-12-10 ENCOUNTER — Ambulatory Visit (INDEPENDENT_AMBULATORY_CARE_PROVIDER_SITE_OTHER): Payer: Medicaid Other | Admitting: Internal Medicine

## 2014-12-10 ENCOUNTER — Encounter: Payer: Self-pay | Admitting: Internal Medicine

## 2014-12-10 VITALS — BP 149/94 | HR 75 | Temp 98.0°F | Wt 215.5 lb

## 2014-12-10 DIAGNOSIS — M869 Osteomyelitis, unspecified: Secondary | ICD-10-CM

## 2014-12-10 MED ORDER — OXYCODONE-ACETAMINOPHEN 5-325 MG PO TABS: 1.0000 | ORAL_TABLET | ORAL | Status: DC | PRN

## 2014-12-10 NOTE — Progress Notes (Signed)
Patient ID: Bryan Huffman, male   DOB: 05-15-1971, 44 y.o.   MRN: [id number]         [facility]  Patient Active Problem List   Diagnosis Date Noted  . Osteomyelitis of hand 09/02/2014    Priority: High  . Polysubstance abuse 09/02/2014  . History of suicidal ideation 09/02/2014  . HTN (hypertension) 09/02/2014  . GERD (gastroesophageal reflux disease) 09/02/2014  . Cigarette smoker 09/02/2014  . History of pancreatitis 09/02/2014  . DDD (degenerative disc disease), lumbar 10/15/2013  . Alcohol dependence 07/26/2013  . Back pain 07/26/2013  . MDD (major depressive disorder) 07/26/2013    Patient's Medications  New Prescriptions   No medications on file  Previous Medications   ASPIRIN 81 MG TABLET    Take 81 mg by mouth daily.   HYDROCHLOROTHIAZIDE (MICROZIDE) 12.5 MG CAPSULE    Take 1 capsule (12.5 mg total) by mouth daily.   LISINOPRIL (PRINIVIL,ZESTRIL) 10 MG TABLET    Take 1 tablet (10 mg total) by mouth daily.   METOPROLOL SUCCINATE (TOPROL-XL) 100 MG 24 HR TABLET    Take 1 tablet (100 mg total) by mouth daily. Take with or immediately following a meal.   OMEPRAZOLE (PRILOSEC) 20 MG CAPSULE    Take 1 capsule (20 mg total) by mouth daily.   SIMVASTATIN (ZOCOR) 10 MG TABLET    Take 10 mg by mouth daily.   TRAZODONE (DESYREL) 150 MG TABLET    Take 1 tablet (150 mg total) by mouth at bedtime as needed for sleep.  Modified Medications   Modified Medication Previous Medication   OXYCODONE-ACETAMINOPHEN (PERCOCET/ROXICET) 5-325 MG PER TABLET oxyCODONE-acetaminophen (PERCOCET/ROXICET) 5-325 MG per tablet      Take 1 tablet by mouth every 4 (four) hours as needed for severe pain.    Take 1 tablet by mouth every 4 (four) hours as needed for severe pain.  Discontinued Medications   No medications on file    Subjective: Bryan Huffman is in for his routine follow-up for his smoldering right hand infection. He was on continuous antibiotics for nearly 3 months for  his right third metacarpal septic arthritis and osteomyelitis with surrounding soft tissue infection. Previous oral regimens have included cephalexin, clindamycin, amoxicillin clavulanate, doxycycline and now ciprofloxacin. He is also been treated with IV vancomycin and received 1 dose of IV oritavancin. He has undergone 2 incision and drainage procedures. All stains and cultures have been negative to date but all specimens were obtained while he was on antibiotic therapy. He stopped all antibiotic therapy one month ago. He says that his hand got better then got worse about 4 days ago. He is having lots of pain and clear serous drainage from his wound. He has not had any fever.  Review of Systems: Pertinent items are noted in HPI.  Past Medical History  Diagnosis Date  . Hypertension   . Back pain   . Insomnia   . Mental disorder   . Depression   . GERD (gastroesophageal reflux disease)   . Hemorrhoid   . Arthritis     History  Substance Use Topics  . Smoking status: Current Every Day Smoker -- 0.50 packs/day for 18 years    Types: Cigarettes  . Smokeless tobacco: Never Used     Comment: cutting back  . Alcohol Use: No     Comment: none since 09/02/14    Family History  Problem Relation Age of Onset  . Hyperlipidemia Father  Allergies  Allergen Reactions  . Bee Venom Swelling    Objective: Temp: 98 F (36.7 C) (03/03 1551) Temp Source: Oral (03/03 1551) BP: 149/94 mmHg (03/03 1551) Pulse Rate: 75 (03/03 1551)  General: He is frustrated Right hand: The swelling over the dorsum of his right hand is unchanged. There is some overlying erythema. He has a chronic open wound that probes deep that is about 10 cm in diameter. The tissue is yellow and there is thin watery drainage expressible.   Lab Results  Component Value Date   WBC 9.1 11/05/2014   HGB 13.8 11/05/2014   HCT 40.7 11/05/2014   MCV 82.7 11/05/2014   PLT 346 11/05/2014   BMET    Component Value  Date/Time   NA 136 11/05/2014 1137   K 4.6 11/05/2014 1137   CL 103 11/05/2014 1137   CO2 21 11/05/2014 1137   GLUCOSE 80 11/05/2014 1137   BUN 16 11/05/2014 1137   CREATININE 0.75 11/05/2014 1137   CREATININE 0.73 01/27/2014 0926   CALCIUM 9.4 11/05/2014 1137   GFRNONAA >90 01/27/2014 0926   GFRAA >90 01/27/2014 0926   SED RATE (mm/hr)  Date Value  11/05/2014 11  09/02/2014 11   CRP (mg/dL)  Date Value  16/10/960401/28/2016 <0.5  09/02/2014 <0.5   RIGHT HAND - 2 VIEW 11/20/2014 COMPARISON: Plain films of the right hand 08/03/2014.  FINDINGS: Bone scan defect is seen over the dorsum of the hand at the level of the MCP joints. No radiopaque foreign body or bony destructive change is identified. There is no fracture or dislocation.  IMPRESSION: Skin wound and soft tissue swelling over the dorsum of the hand without underlying foreign body or evidence of osteomyelitis.   Electronically Signed  By: Drusilla Kannerhomas Dalessio M.D.  On: 11/20/2014 14:06   Assessment: He continues to have induration and a chronic wound over his right third knuckle. Although his plain x-ray does not show clear evidence of osteomyelitis he did have some evidence of septic arthritis with his initial MRI scan last fall. He would be ideal if he had repeat I&D with specimen sent for routine, AFB and fungal stains and cultures now that he has been off of antibiotics for 1 month.   Plan: 1. Continue to observe off of antibiotics 2. I have agreed to give him 16 tablets of Percocet 5-325 3. Discussed possible I&D with Dr. Janee Mornhompson 4. Follow-up in one month   Cliffton AstersJohn Dominika Losey, MD [facility] [phone] pager   [phone] cell 12/10/2014, 4:35 PM

## 2014-12-11 ENCOUNTER — Other Ambulatory Visit: Payer: Self-pay | Admitting: Orthopedic Surgery

## 2014-12-11 ENCOUNTER — Encounter (HOSPITAL_BASED_OUTPATIENT_CLINIC_OR_DEPARTMENT_OTHER): Payer: Self-pay | Admitting: *Deleted

## 2014-12-11 NOTE — Progress Notes (Signed)
Pt can not come in today for BMET or EKG. Plan to do I stat and EKG Monday. Called EKG and no record of an EKG done 09/08/2015.

## 2014-12-14 ENCOUNTER — Ambulatory Visit (HOSPITAL_BASED_OUTPATIENT_CLINIC_OR_DEPARTMENT_OTHER)
Admission: RE | Admit: 2014-12-14 | Discharge: 2014-12-14 | Disposition: A | Discharge status: Home / Self Care | Payer: Medicaid Other | Source: Ambulatory Visit | Attending: Orthopedic Surgery | Admitting: Orthopedic Surgery

## 2014-12-14 ENCOUNTER — Encounter (HOSPITAL_BASED_OUTPATIENT_CLINIC_OR_DEPARTMENT_OTHER)
Admission: RE | Disposition: A | Discharge status: Home / Self Care | Payer: Self-pay | Source: Ambulatory Visit | Attending: Orthopedic Surgery

## 2014-12-14 ENCOUNTER — Ambulatory Visit (HOSPITAL_BASED_OUTPATIENT_CLINIC_OR_DEPARTMENT_OTHER): Payer: Medicaid Other | Admitting: Anesthesiology

## 2014-12-14 ENCOUNTER — Encounter (HOSPITAL_BASED_OUTPATIENT_CLINIC_OR_DEPARTMENT_OTHER): Payer: Self-pay | Admitting: Anesthesiology

## 2014-12-14 DIAGNOSIS — Z91030 Bee allergy status: Secondary | ICD-10-CM | POA: Diagnosis not present

## 2014-12-14 DIAGNOSIS — G4700 Insomnia, unspecified: Secondary | ICD-10-CM | POA: Insufficient documentation

## 2014-12-14 DIAGNOSIS — F419 Anxiety disorder, unspecified: Secondary | ICD-10-CM | POA: Insufficient documentation

## 2014-12-14 DIAGNOSIS — Z981 Arthrodesis status: Secondary | ICD-10-CM | POA: Insufficient documentation

## 2014-12-14 DIAGNOSIS — Z7982 Long term (current) use of aspirin: Secondary | ICD-10-CM | POA: Insufficient documentation

## 2014-12-14 DIAGNOSIS — M1990 Unspecified osteoarthritis, unspecified site: Secondary | ICD-10-CM | POA: Insufficient documentation

## 2014-12-14 DIAGNOSIS — K219 Gastro-esophageal reflux disease without esophagitis: Secondary | ICD-10-CM | POA: Diagnosis not present

## 2014-12-14 DIAGNOSIS — F329 Major depressive disorder, single episode, unspecified: Secondary | ICD-10-CM | POA: Insufficient documentation

## 2014-12-14 DIAGNOSIS — F17210 Nicotine dependence, cigarettes, uncomplicated: Secondary | ICD-10-CM | POA: Diagnosis not present

## 2014-12-14 DIAGNOSIS — M6588 Other synovitis and tenosynovitis, other site: Secondary | ICD-10-CM | POA: Diagnosis not present

## 2014-12-14 DIAGNOSIS — I10 Essential (primary) hypertension: Secondary | ICD-10-CM | POA: Diagnosis not present

## 2014-12-14 DIAGNOSIS — M65841 Other synovitis and tenosynovitis, right hand: Secondary | ICD-10-CM | POA: Diagnosis present

## 2014-12-14 HISTORY — PX: I & D EXTREMITY: SHX5045

## 2014-12-14 HISTORY — DX: Anxiety disorder, unspecified: F41.9

## 2014-12-14 HISTORY — DX: Myoneural disorder, unspecified: G70.9

## 2014-12-14 LAB — POCT I-STAT, CHEM 8
BUN: 26 mg/dL — ABNORMAL HIGH (ref 6–23)
Calcium, Ion: 1.16 mmol/L (ref 1.12–1.23)
Chloride: 106 mmol/L (ref 96–112)
Creatinine, Ser: 0.9 mg/dL (ref 0.50–1.35)
Glucose, Bld: 104 mg/dL — ABNORMAL HIGH (ref 70–99)
HCT: 44 % (ref 39.0–52.0)
Hemoglobin: 15 g/dL (ref 13.0–17.0)
Potassium: 4.4 mmol/L (ref 3.5–5.1)
Sodium: 141 mmol/L (ref 135–145)
TCO2: 24 mmol/L (ref 0–100)

## 2014-12-14 SURGERY — IRRIGATION AND DEBRIDEMENT EXTREMITY
Anesthesia: General | Site: Hand | Laterality: Right

## 2014-12-14 MED ORDER — PROPOFOL 10 MG/ML IV BOLUS
INTRAVENOUS | Status: AC
  Filled 2014-12-14: qty 80

## 2014-12-14 MED ORDER — HYDROMORPHONE HCL 1 MG/ML IJ SOLN
0.2500 mg | INTRAMUSCULAR | Status: DC | PRN
  Administered 2014-12-14 (×4): 0.5 mg via INTRAVENOUS

## 2014-12-14 MED ORDER — BUPIVACAINE-EPINEPHRINE (PF) 0.5% -1:200000 IJ SOLN
INTRAMUSCULAR | Status: AC
  Filled 2014-12-14: qty 60

## 2014-12-14 MED ORDER — PROPOFOL 10 MG/ML IV BOLUS
INTRAVENOUS | Status: DC | PRN
  Administered 2014-12-14: 300 mg via INTRAVENOUS

## 2014-12-14 MED ORDER — ONDANSETRON HCL 4 MG/2ML IJ SOLN
INTRAMUSCULAR | Status: DC | PRN
  Administered 2014-12-14: 4 mg via INTRAVENOUS

## 2014-12-14 MED ORDER — MIDAZOLAM HCL 2 MG/2ML IJ SOLN
INTRAMUSCULAR | Status: AC
  Filled 2014-12-14: qty 2

## 2014-12-14 MED ORDER — LIDOCAINE HCL (PF) 1 % IJ SOLN
INTRAMUSCULAR | Status: AC
  Filled 2014-12-14: qty 60

## 2014-12-14 MED ORDER — OXYCODONE HCL 5 MG PO TABS: 5.0000 mg | ORAL_TABLET | Freq: Once | ORAL | Status: DC | PRN

## 2014-12-14 MED ORDER — FENTANYL CITRATE 0.05 MG/ML IJ SOLN
INTRAMUSCULAR | Status: AC
  Filled 2014-12-14: qty 6

## 2014-12-14 MED ORDER — DEXAMETHASONE SODIUM PHOSPHATE 4 MG/ML IJ SOLN
INTRAMUSCULAR | Status: DC | PRN
  Administered 2014-12-14: 10 mg via INTRAVENOUS

## 2014-12-14 MED ORDER — BUPIVACAINE HCL (PF) 0.5 % IJ SOLN
INTRAMUSCULAR | Status: AC
  Filled 2014-12-14: qty 60

## 2014-12-14 MED ORDER — FENTANYL CITRATE 0.05 MG/ML IJ SOLN: 50.0000 ug | INTRAMUSCULAR | Status: DC | PRN

## 2014-12-14 MED ORDER — LACTATED RINGERS IV SOLN
INTRAVENOUS | Status: DC
  Administered 2014-12-14 (×2): via INTRAVENOUS

## 2014-12-14 MED ORDER — LIDOCAINE HCL (CARDIAC) 20 MG/ML IV SOLN
INTRAVENOUS | Status: DC | PRN
  Administered 2014-12-14: 100 mg via INTRAVENOUS

## 2014-12-14 MED ORDER — HYDROMORPHONE HCL 1 MG/ML IJ SOLN
INTRAMUSCULAR | Status: AC
  Filled 2014-12-14: qty 1

## 2014-12-14 MED ORDER — CEPHALEXIN 500 MG PO CAPS: 500.0000 mg | ORAL_CAPSULE | Freq: Four times a day (QID) | ORAL | Status: DC

## 2014-12-14 MED ORDER — OXYCODONE HCL 5 MG/5ML PO SOLN: 5.0000 mg | Freq: Once | ORAL | Status: DC | PRN

## 2014-12-14 MED ORDER — ONDANSETRON HCL 4 MG/2ML IJ SOLN: 4.0000 mg | Freq: Once | INTRAMUSCULAR | Status: DC | PRN

## 2014-12-14 MED ORDER — PROPOFOL 10 MG/ML IV EMUL
INTRAVENOUS | Status: AC
  Filled 2014-12-14: qty 50

## 2014-12-14 MED ORDER — OXYCODONE-ACETAMINOPHEN 5-325 MG PO TABS: 1.0000 | ORAL_TABLET | ORAL | Status: DC | PRN

## 2014-12-14 MED ORDER — MIDAZOLAM HCL 2 MG/2ML IJ SOLN: 1.0000 mg | INTRAMUSCULAR | Status: DC | PRN

## 2014-12-14 MED ORDER — FENTANYL CITRATE 0.05 MG/ML IJ SOLN
INTRAMUSCULAR | Status: DC | PRN
  Administered 2014-12-14 (×6): 50 ug via INTRAVENOUS

## 2014-12-14 MED ORDER — LACTATED RINGERS IV SOLN
INTRAVENOUS | Status: DC
  Administered 2014-12-14: 08:00:00 via INTRAVENOUS

## 2014-12-14 MED ORDER — BUPIVACAINE-EPINEPHRINE 0.5% -1:200000 IJ SOLN
INTRAMUSCULAR | Status: DC | PRN
  Administered 2014-12-14: 10 mL

## 2014-12-14 MED ORDER — MIDAZOLAM HCL 5 MG/5ML IJ SOLN
INTRAMUSCULAR | Status: DC | PRN
  Administered 2014-12-14: 2 mg via INTRAVENOUS

## 2014-12-14 SURGICAL SUPPLY — 44 items
BANDAGE COBAN STERILE 2 (GAUZE/BANDAGES/DRESSINGS) IMPLANT
BLADE MINI RND TIP GREEN BEAV (BLADE) IMPLANT
BLADE SURG 15 STRL LF DISP TIS (BLADE) ×1 IMPLANT
BLADE SURG 15 STRL SS (BLADE) ×3
BNDG COHESIVE 4X5 TAN STRL (GAUZE/BANDAGES/DRESSINGS) ×3 IMPLANT
BNDG GAUZE ELAST 4 BULKY (GAUZE/BANDAGES/DRESSINGS) ×4 IMPLANT
CHLORAPREP W/TINT 26ML (MISCELLANEOUS) ×3 IMPLANT
CORDS BIPOLAR (ELECTRODE) IMPLANT
COVER BACK TABLE 60X90IN (DRAPES) ×3 IMPLANT
COVER MAYO STAND STRL (DRAPES) ×3 IMPLANT
CUFF TOURNIQUET SINGLE 18IN (TOURNIQUET CUFF) ×2 IMPLANT
DRAIN PENROSE 1/4X12 LTX STRL (WOUND CARE) ×2 IMPLANT
DRAPE EXTREMITY T 121X128X90 (DRAPE) ×3 IMPLANT
DRAPE SURG 17X23 STRL (DRAPES) ×3 IMPLANT
DRSG EMULSION OIL 3X3 NADH (GAUZE/BANDAGES/DRESSINGS) ×3 IMPLANT
GAUZE SPONGE 4X4 12PLY STRL (GAUZE/BANDAGES/DRESSINGS) ×3 IMPLANT
GAUZE SPONGE 4X4 16PLY XRAY LF (GAUZE/BANDAGES/DRESSINGS) ×2 IMPLANT
GLOVE BIO SURGEON STRL SZ7.5 (GLOVE) ×3 IMPLANT
GLOVE BIOGEL PI IND STRL 7.0 (GLOVE) ×1 IMPLANT
GLOVE BIOGEL PI IND STRL 8 (GLOVE) ×1 IMPLANT
GLOVE BIOGEL PI INDICATOR 7.0 (GLOVE) ×2
GLOVE BIOGEL PI INDICATOR 8 (GLOVE) ×2
GLOVE ECLIPSE 6.5 STRL STRAW (GLOVE) ×3 IMPLANT
GOWN STRL REUS W/ TWL LRG LVL3 (GOWN DISPOSABLE) ×2 IMPLANT
GOWN STRL REUS W/TWL LRG LVL3 (GOWN DISPOSABLE) ×6
GOWN STRL REUS W/TWL XL LVL3 (GOWN DISPOSABLE) ×3 IMPLANT
NDL HYPO 25X1 1.5 SAFETY (NEEDLE) IMPLANT
NEEDLE HYPO 25X1 1.5 SAFETY (NEEDLE) IMPLANT
NS IRRIG 1000ML POUR BTL (IV SOLUTION) ×3 IMPLANT
PACK BASIN DAY SURGERY FS (CUSTOM PROCEDURE TRAY) ×3 IMPLANT
PADDING CAST ABS 4INX4YD NS (CAST SUPPLIES)
PADDING CAST ABS COTTON 4X4 ST (CAST SUPPLIES) IMPLANT
RUBBERBAND STERILE (MISCELLANEOUS) IMPLANT
SPONGE GAUZE 4X4 12PLY STER LF (GAUZE/BANDAGES/DRESSINGS) ×2 IMPLANT
STOCKINETTE 6  STRL (DRAPES) ×2
STOCKINETTE 6 STRL (DRAPES) ×1 IMPLANT
SUT ETHILON 4 0 PS 2 18 (SUTURE) ×4 IMPLANT
SUT VICRYL RAPIDE 4-0 (SUTURE) IMPLANT
SUT VICRYL RAPIDE 4/0 PS 2 (SUTURE) IMPLANT
SYR BULB 3OZ (MISCELLANEOUS) ×3 IMPLANT
SYRINGE 10CC LL (SYRINGE) IMPLANT
TOWEL OR 17X24 6PK STRL BLUE (TOWEL DISPOSABLE) ×3 IMPLANT
TOWEL OR NON WOVEN STRL DISP B (DISPOSABLE) ×3 IMPLANT
UNDERPAD 30X30 INCONTINENT (UNDERPADS AND DIAPERS) ×3 IMPLANT

## 2014-12-14 NOTE — Transfer of Care (Signed)
Immediate Anesthesia Transfer of Care Note  Patient: Bryan Huffman  Procedure(s) Performed: Procedure(s) with comments: IRRIGATION AND DEBRIDEMENT EXTREMITY RIGHT HAND (Right) - RRIGATION AND DEBRIDEMENT EXTREMITY RIGHT HAND  Patient Location: PACU  Anesthesia Type:General  Level of Consciousness: awake and patient cooperative  Airway & Oxygen Therapy: Patient Spontanous Breathing and Patient connected to face mask oxygen  Post-op Assessment: Report given to RN and Post -op Vital signs reviewed and stable  Post vital signs: Reviewed and stable  Last Vitals:  Filed Vitals:   12/14/14 0716  BP: 125/76  Pulse: 70  Temp: 36.8 C  Resp: 20    Complications: No apparent anesthesia complications

## 2014-12-14 NOTE — Anesthesia Postprocedure Evaluation (Signed)
  Anesthesia Post-op Note  Patient: Bryan Huffman  Procedure(s) Performed: Procedure(s) with comments: IRRIGATION AND DEBRIDEMENT EXTREMITY RIGHT HAND (Right) - RRIGATION AND DEBRIDEMENT EXTREMITY RIGHT HAND  Patient Location: PACU  Anesthesia Type: General   Level of Consciousness: awake, alert  and oriented  Airway and Oxygen Therapy: Patient Spontanous Breathing  Post-op Pain: mild  Post-op Assessment: Post-op Vital signs reviewed  Post-op Vital Signs: Reviewed  Last Vitals:  Filed Vitals:   12/14/14 1023  BP: 130/74  Pulse: 68  Temp: 36.6 C  Resp: 16    Complications: No apparent anesthesia complications

## 2014-12-14 NOTE — Anesthesia Preprocedure Evaluation (Signed)
Anesthesia Evaluation  Patient identified by MRN, date of birth, ID band Patient awake    Reviewed: Allergy & Precautions, NPO status , Patient's Chart, lab work & pertinent test results, reviewed documented beta blocker date and time   Airway Mallampati: I  TM Distance: >3 FB Neck ROM: Full    Dental  (+) Teeth Intact, Dental Advisory Given   Pulmonary Current Smoker,  breath sounds clear to auscultation        Cardiovascular hypertension, Pt. on medications and Pt. on home beta blockers Rhythm:Regular Rate:Normal     Neuro/Psych    GI/Hepatic GERD-  Medicated and Controlled,  Endo/Other    Renal/GU      Musculoskeletal   Abdominal   Peds  Hematology   Anesthesia Other Findings   Reproductive/Obstetrics                             Anesthesia Physical Anesthesia Plan  ASA: II  Anesthesia Plan: General   Post-op Pain Management:    Induction: Intravenous  Airway Management Planned: LMA  Additional Equipment:   Intra-op Plan:   Post-operative Plan: Extubation in OR  Informed Consent: I have reviewed the patients History and Physical, chart, labs and discussed the procedure including the risks, benefits and alternatives for the proposed anesthesia with the patient or authorized representative who has indicated his/her understanding and acceptance.   Dental advisory given  Plan Discussed with: CRNA, Anesthesiologist and Surgeon  Anesthesia Plan Comments:         Anesthesia Quick Evaluation

## 2014-12-14 NOTE — Anesthesia Procedure Notes (Signed)
Procedure Name: LMA Insertion Date/Time: 12/14/2014 7:43 AM Performed by: Genevieve NorlanderLINKA, Kinslei Labine L Pre-anesthesia Checklist: Patient identified, Emergency Drugs available, Suction available, Patient being monitored and Timeout performed Patient Re-evaluated:Patient Re-evaluated prior to inductionOxygen Delivery Method: Circle System Utilized Preoxygenation: Pre-oxygenation with 100% oxygen Intubation Type: IV induction Ventilation: Mask ventilation without difficulty LMA: LMA inserted LMA Size: 5.0 Number of attempts: 1 Airway Equipment and Method: Bite block Placement Confirmation: positive ETCO2 Tube secured with: Tape Dental Injury: Teeth and Oropharynx as per pre-operative assessment

## 2014-12-14 NOTE — Op Note (Signed)
12/14/2014  8:52 AM  PATIENT:  Bryan Huffman  44 y.o. male  PRE-OPERATIVE DIAGNOSIS:  Chronic right hand infection  POST-OPERATIVE DIAGNOSIS:  Same  PROCEDURE:  Right hand excisional debridement of skin, subcutaneous tissues and tenosynovium, with extensive radical tenosynovectomy of multiple tendons on the dorsum of the hand/wrist  SURGEON: Cliffton Astersavid A. Janee Mornhompson, MD  PHYSICIAN ASSISTANT: Danielle RankinKirsten Schrader, OPA-C  ANESTHESIA:  general  SPECIMENS:  None  DRAINS:   None  EBL:  less than 50 mL  PREOPERATIVE INDICATIONS:  Bryan Huffman is a  44 y.o. male with chronic right hand infection despite treatment with protracted standard antimicrobials. Previous cultures have been negative, but were all obtained while on antibiotics.  The risks benefits and alternatives were discussed with the patient preoperatively including but not limited to the risks of infection, bleeding, nerve injury, cardiopulmonary complications, the need for revision surgery, among others, and the patient verbalized understanding and consented to proceed.  OPERATIVE IMPLANTS: None  OPERATIVE PROCEDURE:  After withholding prophylactic antibiotics, the patient was escorted to the operative theatre and placed in a supine position.  General anesthesia was administered.  A surgical "time-out" was performed during which the planned procedure, proposed operative site, and the correct patient identity were compared to the operative consent and agreement confirmed by the circulating nurse according to current facility policy.  Following application of a tourniquet to the operative extremity, the exposed skin was prescribed with a Hibiclens scrub brush before being formally prepped with Chloraprep and draped in the usual sterile fashion.  The limb was exsanguinated with gravity and the tourniquet inflated to approximately 100mmHg higher than systolic BP.  The previous wound was elliptically excised and extended proximally. There  was extensive tenosynovial thickening encountered, and no purulent fluid. The tissues and the fluid surrounding them appeared serous. The extensor tendon to the long, index and ring fingers were all radically debrided of tenosynovium, all the way down to the deep fascia of the hand. Care was taken to protect and preserve the tendons and the sagittal bands. Again noted was exposed MCP joint of the long finger, which had some synovium within it that was debrided with the right your but the articular cartilage surfaces were noted to be pristine. Once satisfied with the degree of debridement, the wound was copiously irrigated and the tourniquet released. Some additional hemostasis was obtained. Half percent Marcaine with epinephrine was instilled into the wound edges for postoperative pain control and the skin was closed with 4-0 nylon sutures as vertical mattress sutures, leaving a Penrose drain for the most proximal portion of the wound which was well removed from the active process. Around the drain was placed horizontal mattress suture, not tied but left long for potential delayed primary closure in the office. A bulky hand dressing was applied without plaster, and he was awakened and taken to recovery room in stable condition, breathing spontaneously  DISPOSITION: He'll be discharged home today, with an antibiotic plan coordinated with Dr. Cliffton AstersJohn Campbell of Infectious Diseases. He will follow up on Wednesday or Thursday of this week for wound check, likely pulling the Penrose and [facility].

## 2014-12-14 NOTE — Discharge Instructions (Addendum)
Discharge Instructions   Move your fingers as much as possible, making a full fist and fully opening the fist. Elevate your hand to reduce pain & swelling of the digits.  Ice over the operative site may be helpful to reduce pain & swelling.  DO NOT USE HEAT. Pain medicine has been prescribed for you.  Use your medicine as needed over the first 48 hours, and then you can begin to taper your use.  You may use Tylenol in place of your prescribed pain medication, but not IN ADDITION to it. Leave the dressing in place until you return to our office.  You may shower, but keep the bandage clean & dry.  You may drive a car when you are off of prescription pain medications and can safely control your vehicle with both hands. Our office will call you to arrange follow-up   Please call [phone] during normal business hours or [phone] after hours for any problems. Including the following:  - excessive redness of the incisions - drainage for more than 4 days - fever of more than 101.5 F  *Please note that pain medications will not be refilled after hours or on weekends.   Post Anesthesia Home Care Instructions  Activity: Get plenty of rest for the remainder of the day. A responsible adult should stay with you for 24 hours following the procedure.  For the next 24 hours, DO NOT: -Drive a car -Advertising copywriterperate machinery -Drink alcoholic beverages -Take any medication unless instructed by your physician -Make any legal decisions or sign important papers.  Meals: Start with liquid foods such as gelatin or soup. Progress to regular foods as tolerated. Avoid greasy, spicy, heavy foods. If nausea and/or vomiting occur, drink only clear liquids until the nausea and/or vomiting subsides. Call your physician if vomiting continues.  Special Instructions/Symptoms: Your throat may feel dry or sore from the anesthesia or the breathing tube placed in your throat during surgery. If this causes discomfort,  gargle with warm salt water. The discomfort should disappear within 24 hours.

## 2014-12-14 NOTE — H&P (Signed)
Bryan Huffman is an 44 y.o. male.   Chief Complaint: Right hand infection HPI: This patient developed a right hand infection and has undergone 2 previous debridements. Cultures which were obtained time, while he was on antibiotics, failed to provide any growth. Dr. Orvan Huffman, infectious disease physician, requests we proceed with another debridement, obtaining cultures this time off antibiotics since the infection which seemed suppressed while on antibiotics has flared and is obviously not resolved  Past Medical History  Diagnosis Date  . Hypertension   . Back pain   . Insomnia   . Mental disorder   . Depression   . GERD (gastroesophageal reflux disease)   . Hemorrhoid   . Arthritis   . Anxiety   . Neuromuscular disorder     left leg pain - 4 back surgeries    Past Surgical History  Procedure Laterality Date  . Tonsillectomy    . Back surgery  1998,2008    L3 L4 L5 fusion  . Lumbar spine surgery  10/15/2013    L 2 L3  DISECTOMY  . Lumbar laminectomy/decompression microdiscectomy Left 10/15/2013    Procedure: LUMBAR 2-3 LEFT DISCECTOMY;  Surgeon: Bryan Lickahari Brooks, MD;  Location: [facility] OR;  Service: Orthopedics;  Laterality: Left;  . Anterior lat lumbar fusion N/A 01/29/2014    Procedure: X LIF L2-L3 WITH LATERAL PLATES -ANTERIOR LATERAL LUMBAR FUSION 1 LEVEL;  Surgeon: Bryan Lickahari Brooks, MD;  Location: [facility] OR;  Service: Orthopedics;  Laterality: N/A;  . Spine surgery    . I&d extremity Right 09/07/2014    Procedure: DEBRIDEMENT RIGHT HAND EXTENSOR TENOSYNOVECTOMY;  Surgeon: Bryan Marbleavid A Haydyn Girvan, MD;  Location: [facility];  Service: Orthopedics;  Laterality: Right;    Family History  Problem Relation Age of Onset  . Hyperlipidemia Father    Social History:  reports that he has been smoking Cigarettes.  He has a 9 pack-year smoking history. He has never used smokeless tobacco. He reports that he drinks about 1.2 oz of alcohol per week. He reports that he uses illicit drugs  (Marijuana, Oxycodone, and "Crack" cocaine).  Allergies:  Allergies  Allergen Reactions  . Bee Venom Swelling    Medications Prior to Admission  Medication Sig Dispense Refill  . aspirin 81 MG tablet Take 81 mg by mouth daily.    . hydrochlorothiazide (MICROZIDE) 12.5 MG capsule Take 1 capsule (12.5 mg total) by mouth daily. 30 capsule 0  . lisinopril (PRINIVIL,ZESTRIL) 10 MG tablet Take 1 tablet (10 mg total) by mouth daily. 30 tablet 0  . metoprolol succinate (TOPROL-XL) 100 MG 24 hr tablet Take 1 tablet (100 mg total) by mouth daily. Take with or immediately following a meal. 30 tablet 0  . omeprazole (PRILOSEC) 20 MG capsule Take 1 capsule (20 mg total) by mouth daily.    Marland Kitchen. oxyCODONE-acetaminophen (PERCOCET/ROXICET) 5-325 MG per tablet Take 1 tablet by mouth every 4 (four) hours as needed for severe pain. 16 tablet 0  . simvastatin (ZOCOR) 10 MG tablet Take 10 mg by mouth daily.    . traZODone (DESYREL) 150 MG tablet Take 1 tablet (150 mg total) by mouth at bedtime as needed for sleep. 30 tablet 0    No results found for this or any previous visit (from the past 48 hour(s)). No results found.  Review of Systems  All other systems reviewed and are negative.   Blood pressure 125/76, pulse 70, temperature 98.2 F (36.8 C), temperature source Oral, resp. rate 20, height 5\' 7"  (1.702 m),  weight 96.616 kg (213 lb), SpO2 98 %. Physical Exam  Constitutional: He is oriented to person, place, and time. He appears well-developed and well-nourished.  HENT:  Head: Normocephalic and atraumatic.  Eyes: Pupils are equal, round, and reactive to light.  Neck: Normal range of motion. Neck supple.  Cardiovascular: Intact distal pulses.   Respiratory: Effort normal.  GI: Soft.  Musculoskeletal:  Wound over dorsum of hand in the midline, with expressible clearish drainage  Neurological: He is alert and oriented to person, place, and time.  Skin: Skin is warm and dry.  Psychiatric: He has a  normal mood and affect. His behavior is normal. Judgment and thought content normal.     Assessment/Plan Chronic right hand draining wound  We'll proceed with operative debridement to include skin, subcutaneous tissues, obtaining complete cultures to include Gram stain, routine cultures, AFB, and fungus, with likely direct repair, leaving a drain much more proximally and placing sutures to allow for [place] of the drain hole.  Goals, risks, and options reviewed. Consent obtained.  Bryan Huffman A. 12/14/2014, 7:36 AM

## 2014-12-15 ENCOUNTER — Encounter (HOSPITAL_BASED_OUTPATIENT_CLINIC_OR_DEPARTMENT_OTHER): Payer: Self-pay | Admitting: Orthopedic Surgery

## 2014-12-17 LAB — TISSUE CULTURE: Culture: NO GROWTH

## 2014-12-19 LAB — ANAEROBIC CULTURE

## 2014-12-23 ENCOUNTER — Telehealth: Payer: Self-pay | Admitting: *Deleted

## 2014-12-23 NOTE — Telephone Encounter (Signed)
Per Dr. Orvan Falconerampbell, patient scheduled for 3/17 8:45.  Patient accepted. Andree CossHowell, Michelle M, RN

## 2014-12-23 NOTE — Telephone Encounter (Signed)
First available appointment is with Dr. Drue SecondSnider the day before you on 01/11/15. Should he be overbooked or have Dr. Janee Mornhompson call MD on call with questions?

## 2014-12-23 NOTE — Telephone Encounter (Signed)
Patient called stating that the surgeon, Dr. Janee Mornhompson removed his sutures today and suggested he get in with Dr. Janee Mornhompson ASAP. He has a follow up on 01/12/15 and feels he can not wait that long and thinks he needs an antibiotic. Do you want to move up patient's appt; please advise. Wendall MolaJacqueline Cockerham CMA

## 2014-12-23 NOTE — Telephone Encounter (Signed)
Please see if there is a work in slot for him with one of my clinic partners. All recent operative cultures remain negative.

## 2014-12-23 NOTE — Telephone Encounter (Signed)
I will see him in clinic tomorrow morning at 8:45 AM.

## 2014-12-24 ENCOUNTER — Encounter: Payer: Self-pay | Admitting: Internal Medicine

## 2014-12-24 ENCOUNTER — Ambulatory Visit (INDEPENDENT_AMBULATORY_CARE_PROVIDER_SITE_OTHER): Payer: Medicaid Other | Admitting: Internal Medicine

## 2014-12-24 DIAGNOSIS — M869 Osteomyelitis, unspecified: Secondary | ICD-10-CM

## 2014-12-24 MED ORDER — AZITHROMYCIN 250 MG PO TABS: 250.0000 mg | ORAL_TABLET | Freq: Every day | ORAL | Status: DC

## 2014-12-24 MED ORDER — CIPROFLOXACIN HCL 500 MG PO TABS: 500.0000 mg | ORAL_TABLET | Freq: Two times a day (BID) | ORAL | Status: DC

## 2014-12-24 MED ORDER — RIFAMPIN 300 MG PO CAPS: 600.0000 mg | ORAL_CAPSULE | Freq: Every day | ORAL | Status: DC

## 2014-12-24 NOTE — Progress Notes (Signed)
Patient ID: Bryan Huffman, male   DOB: 02-19-1971, 44 y.o.   MRN: [id number]         [facility]  Patient Active Problem List   Diagnosis Date Noted  . Osteomyelitis of hand 09/02/2014    Priority: High  . Polysubstance abuse 09/02/2014  . History of suicidal ideation 09/02/2014  . HTN (hypertension) 09/02/2014  . GERD (gastroesophageal reflux disease) 09/02/2014  . Cigarette smoker 09/02/2014  . History of pancreatitis 09/02/2014  . DDD (degenerative disc disease), lumbar 10/15/2013  . Alcohol dependence 07/26/2013  . Back pain 07/26/2013  . MDD (major depressive disorder) 07/26/2013    Patient's Medications  New Prescriptions   AZITHROMYCIN (ZITHROMAX) 250 MG TABLET    Take 1 tablet (250 mg total) by mouth daily.   RIFAMPIN (RIFADIN) 300 MG CAPSULE    Take 2 capsules (600 mg total) by mouth daily.  Previous Medications   ASPIRIN 81 MG TABLET    Take 81 mg by mouth daily.   HYDROCHLOROTHIAZIDE (MICROZIDE) 12.5 MG CAPSULE    Take 1 capsule (12.5 mg total) by mouth daily.   LISINOPRIL (PRINIVIL,ZESTRIL) 10 MG TABLET    Take 1 tablet (10 mg total) by mouth daily.   METOPROLOL SUCCINATE (TOPROL-XL) 100 MG 24 HR TABLET    Take 1 tablet (100 mg total) by mouth daily. Take with or immediately following a meal.   OMEPRAZOLE (PRILOSEC) 20 MG CAPSULE    Take 1 capsule (20 mg total) by mouth daily.   OXYCODONE-ACETAMINOPHEN (PERCOCET/ROXICET) 5-325 MG PER TABLET    Take 1-2 tablets by mouth every 4 (four) hours as needed for severe pain.   SIMVASTATIN (ZOCOR) 10 MG TABLET    Take 10 mg by mouth daily.   TRAZODONE (DESYREL) 150 MG TABLET    Take 1 tablet (150 mg total) by mouth at bedtime as needed for sleep.  Modified Medications   Modified Medication Previous Medication   CIPROFLOXACIN (CIPRO) 500 MG TABLET ciprofloxacin (CIPRO) 500 MG tablet      Take 1 tablet (500 mg total) by mouth 2 (two) times daily.    Take 1 tablet (500 mg total) by mouth 2 (two)  times daily.  Discontinued Medications   CEPHALEXIN (KEFLEX) 500 MG CAPSULE    Take 1 capsule (500 mg total) by mouth 4 (four) times daily.    Subjective: Bryan Huffman is in for routine follow-up for his right hand infection. After his last visit with me he stopped all antibiotics. Several weeks later he underwent his third incision and drainage procedure on 12/14/2014. Pathology was benign. He continues to have problems healing. He has not had any fever, chills or sweats. So far all stains and cultures remain negative. His sutures were removed yesterday by Dr. Janee Morn. [name] says that while he was washing his hands last night the incision opened up and drained thin fluid. Review of Systems: Pertinent items are noted in HPI.  Past Medical History  Diagnosis Date  . Hypertension   . Back pain   . Insomnia   . Mental disorder   . Depression   . GERD (gastroesophageal reflux disease)   . Hemorrhoid   . Arthritis   . Anxiety   . Neuromuscular disorder     left leg pain - 4 back surgeries    History  Substance Use Topics  . Smoking status: Current Every Day Smoker -- 0.50 packs/day for 18 years    Types: Cigarettes  . Smokeless tobacco: Never Used  Comment: cutting back  . Alcohol Use: 1.2 oz/week    2 Cans of beer per week     Comment: none since 09/02/14    Family History  Problem Relation Age of Onset  . Hyperlipidemia Father     Allergies  Allergen Reactions  . Bee Venom Swelling    Objective: Temp: 97.8 F (36.6 C) (03/17 0849) Temp Source: Oral (03/17 0849) BP: 125/71 mmHg (03/17 0849) Pulse Rate: 62 (03/17 0849)  General: He is worried but otherwise in no distress Right hand: The distal half of his dorsal incision is open with white shaggy edges. There remains some diffuse swelling and mild erythema around the proximal incision  Lab Results Routine fungal and AFB stains and cultures 12/14/2014 all negative to date   Assessment: It remains unclear why he has  had so much difficulty healing. I talked to him again about the possibility of some indolent infection such as nontuberculous mycobacteria. After reviewing all options I agreed to start him on a new, empiric 3 drug regimen to provide some coverage for rapid growing mycobacteria and Mycobacterium avium pending final culture results.  Plan: 1. Start azithromycin, ciprofloxacin and rifampin pending final culture results 2. Follow-up on 01/12/2015   Cliffton AstersJohn Wasim Hurlbut, MD [facility] [phone] pager   [phone] cell 12/24/2014, 9:10 AM

## 2014-12-29 ENCOUNTER — Telehealth: Payer: Self-pay

## 2014-12-29 NOTE — Telephone Encounter (Signed)
Pateint called " my hand looks aweful" .   The area is split open( " hole"  )where the stictches were removed on last Wednesday.  Pain is present with white looking tissue inside wound.  Edges have raw spots.  He was told not to call the surgeon to follow up with Dr Orvan Falconerampbell.    I spoke with Dr Orvan Falconerampbell and advised patient to continue antibiotics , keep wound wrapped and limit use of hand.  Cultures returned normal.  Patient advised to call our office if numbness or redness develops with a fever or symptoms worsen.  Reminder to make sure he keeps scheduled visit with Dr Orvan Falconerampbell for April.   Laurell Josephsammy K King, RN

## 2015-01-07 ENCOUNTER — Other Ambulatory Visit: Payer: Self-pay | Admitting: *Deleted

## 2015-01-07 ENCOUNTER — Telehealth: Payer: Self-pay | Admitting: *Deleted

## 2015-01-07 DIAGNOSIS — M869 Osteomyelitis, unspecified: Secondary | ICD-10-CM

## 2015-01-07 NOTE — Telephone Encounter (Signed)
Please make a referral to the wound center.

## 2015-01-07 NOTE — Telephone Encounter (Signed)
Patient called to report that he is having increased pain in his hand. He also reports that the wound had reopened and he is having diarrhea due to the antibiotics. He is not able to sleep due to the pain. He wonders how long it will take until his hand is healed. Advised the patient no one can say how long a wound will take to heal. Everyone is different and that he should rest his hand and not work with it until it has healed more. He advised he has to work and that he is careful. The patient advised his PCP wants to know why he has not been seen by wound care. Advised the patient his PCP could refer him if he feels strongly about it but that it still takes time for wounds to heal especially on areas of the body that have to bend and flex. Advised him will get the doctor a message to see what he should do about the pain and that the wound has reopened since the sutures were removed last week and looks worse. Also reminded the patient of his appt here 01/13/15 with Dr Orvan Falconerampbell. Advised him will call him back asap.

## 2015-01-07 NOTE — Telephone Encounter (Signed)
Called the patient and advised him of the Referal to Wound Care. Advised him will call him once appt is made or they will call him. He advised ok and will take imodium until his visit 01/12/15 if the diarrhea keeps happening.

## 2015-01-09 LAB — FUNGUS CULTURE W SMEAR: FUNGAL SMEAR: NONE SEEN

## 2015-01-12 ENCOUNTER — Ambulatory Visit (INDEPENDENT_AMBULATORY_CARE_PROVIDER_SITE_OTHER): Payer: Medicaid Other | Admitting: Internal Medicine

## 2015-01-12 ENCOUNTER — Encounter: Payer: Self-pay | Admitting: Internal Medicine

## 2015-01-12 VITALS — BP 157/108 | HR 78 | Temp 97.9°F

## 2015-01-12 DIAGNOSIS — T8189XD Other complications of procedures, not elsewhere classified, subsequent encounter: Secondary | ICD-10-CM

## 2015-01-12 MED ORDER — OXYCODONE-ACETAMINOPHEN 5-325 MG PO TABS: 1.0000 | ORAL_TABLET | ORAL | Status: DC | PRN

## 2015-01-12 NOTE — Progress Notes (Signed)
Applied sterile non-adherent pad, 2-2x2s and 2 in. coflex to right hand.

## 2015-01-12 NOTE — Progress Notes (Signed)
Patient ID: Bryan Huffman, male   DOB: September 19, 1971, 44 y.o.   MRN: [id number]         [facility] for Infectious Disease  Patient Active Problem List   Diagnosis Date Noted  . Osteomyelitis of hand 09/02/2014    Priority: High  . Polysubstance abuse 09/02/2014  . History of suicidal ideation 09/02/2014  . HTN (hypertension) 09/02/2014  . GERD (gastroesophageal reflux disease) 09/02/2014  . Cigarette smoker 09/02/2014  . History of pancreatitis 09/02/2014  . DDD (degenerative disc disease), lumbar 10/15/2013  . Alcohol dependence 07/26/2013  . Back pain 07/26/2013  . MDD (major depressive disorder) 07/26/2013    Patient's Medications  New Prescriptions   No medications on file  Previous Medications   ASPIRIN 81 MG TABLET    Take 81 mg by mouth daily.   HYDROCHLOROTHIAZIDE (MICROZIDE) 12.5 MG CAPSULE    Take 1 capsule (12.5 mg total) by mouth daily.   LISINOPRIL (PRINIVIL,ZESTRIL) 10 MG TABLET    Take 1 tablet (10 mg total) by mouth daily.   METOPROLOL SUCCINATE (TOPROL-XL) 100 MG 24 HR TABLET    Take 1 tablet (100 mg total) by mouth daily. Take with or immediately following a meal.   OMEPRAZOLE (PRILOSEC) 20 MG CAPSULE    Take 1 capsule (20 mg total) by mouth daily.   SIMVASTATIN (ZOCOR) 10 MG TABLET    Take 10 mg by mouth daily.   TRAZODONE (DESYREL) 150 MG TABLET    Take 1 tablet (150 mg total) by mouth at bedtime as needed for sleep.  Modified Medications   Modified Medication Previous Medication   OXYCODONE-ACETAMINOPHEN (PERCOCET/ROXICET) 5-325 MG PER TABLET oxyCODONE-acetaminophen (PERCOCET/ROXICET) 5-325 MG per tablet      Take 1-2 tablets by mouth every 4 (four) hours as needed for severe pain.    Take 1-2 tablets by mouth every 4 (four) hours as needed for severe pain.  Discontinued Medications   AZITHROMYCIN (ZITHROMAX) 250 MG TABLET    Take 1 tablet (250 mg total) by mouth daily.   CIPROFLOXACIN (CIPRO) 500 MG TABLET    Take 1 tablet (500 mg total) by mouth 2  (two) times daily.   RIFAMPIN (RIFADIN) 300 MG CAPSULE    Take 2 capsules (600 mg total) by mouth daily.    Subjective: Bryan Huffman is in with his father for his routine follow-up of his poorly healing right hand wound. He underwent his third incision and drainage procedure on 12/14/2014. All previous cultures had been negative but they were collected while on antibiotics. He been off of antibiotics for over one month when he had his last procedure. All stains routine cultures fungal cultures and AFB cultures remain negative. He started on azithromycin, ciprofloxacin and rifampin at the time of his last visit with me. He has had problems with nausea and diarrhea since starting the antibiotics. He is only taking ciprofloxacin once daily. He has not had any fever, chills or sweats. He feels like his right hand is worse. He is having severe pain. He has been painting some and using his hand but he tries to limit that as much as possible.   Review of Systems: Pertinent items are noted in HPI.  Past Medical History  Diagnosis Date  . Hypertension   . Back pain   . Insomnia   . Mental disorder   . Depression   . GERD (gastroesophageal reflux disease)   . Hemorrhoid   . Arthritis   . Anxiety   . Neuromuscular disorder  left leg pain - 4 back surgeries    History  Substance Use Topics  . Smoking status: Current Every Day Smoker -- 0.50 packs/day for 18 years    Types: Cigarettes  . Smokeless tobacco: Never Used     Comment: cutting back  . Alcohol Use: 1.2 oz/week    2 Cans of beer per week     Comment: none since 09/02/14    Family History  Problem Relation Age of Onset  . Hyperlipidemia Father     Allergies  Allergen Reactions  . Bee Venom Swelling    Objective: Temp: 97.9 F (36.6 C) (04/05 1035) Temp Source: Oral (04/05 1035) BP: 157/108 mmHg (04/05 1035) Pulse Rate: 78 (04/05 1035)  General: He is very frustrated Right hand: The wound on the dorsum of his hand is  larger. It is football-shaped and measures about 2 inches long by 1 inches wide. There is shaggy yellow tissue covering 90% of the wound base. There is clear drainage. There is swelling over the dorsum of his hand but no erythema. There is no odor  Assessment: I cannot explain why his wound is not healing. All recent cultures obtained off of antibiotics are negative and he is not improving on his current antibiotic regimen. He is tolerating his antibiotics poorly. I will have him stop all antibiotics now. If his diarrhea persist I will need to check a C. difficile PCR. I have made a referral to the wound center.  Plan: 1. Stop azithromycin, ciprofloxacin and rifampin  2. I agreed to give him 60 oxycodone/acetaminophen 5/325 mg #60 with no refills. I told him I would not be able to give him further refills 3. Wound clinic referral 4. Follow-up here in one month   Cliffton AstersJohn Estevan Kersh, MD [facility] [phone] pager   [phone] cell 01/12/2015, 11:03 AM

## 2015-01-22 ENCOUNTER — Encounter (HOSPITAL_BASED_OUTPATIENT_CLINIC_OR_DEPARTMENT_OTHER): Payer: Medicaid Other | Attending: Internal Medicine

## 2015-01-22 DIAGNOSIS — L02511 Cutaneous abscess of right hand: Secondary | ICD-10-CM | POA: Insufficient documentation

## 2015-01-22 DIAGNOSIS — S60921D Unspecified superficial injury of right hand, subsequent encounter: Secondary | ICD-10-CM | POA: Insufficient documentation

## 2015-01-22 DIAGNOSIS — W1839XD Other fall on same level, subsequent encounter: Secondary | ICD-10-CM | POA: Diagnosis not present

## 2015-01-28 LAB — AFB CULTURE WITH SMEAR (NOT AT ARMC): Acid Fast Smear: NONE SEEN

## 2015-01-29 DIAGNOSIS — L02511 Cutaneous abscess of right hand: Secondary | ICD-10-CM | POA: Diagnosis not present

## 2015-01-29 DIAGNOSIS — S60921D Unspecified superficial injury of right hand, subsequent encounter: Secondary | ICD-10-CM | POA: Diagnosis not present

## 2015-02-09 ENCOUNTER — Ambulatory Visit: Payer: Medicaid Other | Admitting: Internal Medicine

## 2015-02-11 ENCOUNTER — Encounter: Payer: Self-pay | Admitting: Internal Medicine

## 2015-02-11 ENCOUNTER — Ambulatory Visit (INDEPENDENT_AMBULATORY_CARE_PROVIDER_SITE_OTHER): Payer: Medicaid Other | Admitting: Internal Medicine

## 2015-02-11 VITALS — BP 103/69 | HR 63 | Temp 98.2°F | Wt 211.5 lb

## 2015-02-11 DIAGNOSIS — T8189XD Other complications of procedures, not elsewhere classified, subsequent encounter: Secondary | ICD-10-CM

## 2015-02-11 NOTE — Progress Notes (Addendum)
Patient ID: Bryan Huffman, male   DOB: [birth year], 44 y.o.   MRN: [id number]         [facility]  Patient Active Problem List   Diagnosis Date Noted  . Osteomyelitis of hand 09/02/2014    Priority: High  . Polysubstance abuse 09/02/2014  . History of suicidal ideation 09/02/2014  . HTN (hypertension) 09/02/2014  . GERD (gastroesophageal reflux disease) 09/02/2014  . Cigarette smoker 09/02/2014  . History of pancreatitis 09/02/2014  . DDD (degenerative disc disease), lumbar 10/15/2013  . Alcohol dependence 07/26/2013  . Back pain 07/26/2013  . MDD (major depressive disorder) 07/26/2013    Patient's Medications  New Prescriptions   No medications on file  Previous Medications   ASPIRIN 81 MG TABLET    Take 81 mg by mouth daily.   CLARITHROMYCIN (BIAXIN) 500 MG TABLET    Take 500 mg by mouth 2 (two) times daily.   HYDROCHLOROTHIAZIDE (MICROZIDE) 12.5 MG CAPSULE    Take 1 capsule (12.5 mg total) by mouth daily.   LISINOPRIL (PRINIVIL,ZESTRIL) 10 MG TABLET    Take 1 tablet (10 mg total) by mouth daily.   METOPROLOL SUCCINATE (TOPROL-XL) 100 MG 24 HR TABLET    Take 1 tablet (100 mg total) by mouth daily. Take with or immediately following a meal.   MOXIFLOXACIN (AVELOX) 400 MG TABLET    Take 400 mg by mouth daily at 8 pm.   OMEPRAZOLE (PRILOSEC) 20 MG CAPSULE    Take 1 capsule (20 mg total) by mouth daily.   OXYCODONE-ACETAMINOPHEN (PERCOCET/ROXICET) 5-325 MG PER TABLET    Take 1-2 tablets by mouth every 4 (four) hours as needed for severe pain.   SIMVASTATIN (ZOCOR) 10 MG TABLET    Take 10 mg by mouth daily.   TRAZODONE (DESYREL) 150 MG TABLET    Take 1 tablet (150 mg total) by mouth at bedtime as needed for sleep.  Modified Medications   No medications on file  Discontinued Medications   No medications on file    Subjective: Bryan Huffman is in with his father for his routine follow-up of his poorly healing right hand wound. He has been followed recently  at the local wound Center. He had a little debridement done there and has been using Santyl. [facility] Heywood BenePeter Leone, an infectious disease specialist on 01/15/2015.. A swab specimen from his wound on his right hand was again culture negative. Dr. Maryagnes AmosLeone put him on 3 drug therapy with linezolid, clarithromycin and moxifloxacin. He completed 2 weeks of linezolid and continues on the other 2 antibiotics. He is tolerating the antibiotic therapy well. He still having considerable pain in his right hand and request a refill of his narcotic pain medication.   Review of Systems: Pertinent items are noted in HPI.  Past Medical History  Diagnosis Date  . Hypertension   . Back pain   . Insomnia   . Mental disorder   . Depression   . GERD (gastroesophageal reflux disease)   . Hemorrhoid   . Arthritis   . Anxiety   . Neuromuscular disorder     left leg pain - 4 back surgeries    History  Substance Use Topics  . Smoking status: Current Every Day Smoker -- 0.50 packs/day for 18 years    Types: Cigarettes  . Smokeless tobacco: Never Used     Comment: cutting back  . Alcohol Use: 1.2 oz/week    2 Cans of  beer per week     Comment: none since 09/02/14    Family History  Problem Relation Age of Onset  . Hyperlipidemia Father     Allergies  Allergen Reactions  . Bee Venom Swelling    Objective: Temp: 98.2 F (36.8 C) (05/05 0848) Temp Source: Oral (05/05 0848) BP: 103/69 mmHg (05/05 0848) Pulse Rate: 63 (05/05 0848)  General: He is very frustrated Right hand: The wound on the dorsum of his hand is unchanged in size. It is football-shaped and measures about 2 inches long by 1 inches wide. There is shaggy yellow tissue covering 90% of the wound base. There is clear drainage. The swelling and erythema over the dorsum of his hand is slightly improved. There is no odor  Assessment: I cannot explain why his wound is not healing. All recent cultures obtained off of  antibiotics are negative and he is not improving on his current antibiotic regimen. He is tolerating his antibiotics well. I agree with him continuing his current antibiotic therapy at least until he follows up with Dr. Maryagnes AmosLeone on 02/26/2015. He may need plastic surgery evaluation to help close the wound. I told him that I did not feel comfortable prescribing narcotic pain medication for him. I suggested that he make a follow-up appointment with his primary care provider.  Plan: 1. Continue clarithromycin and moxifloxacin 2. Routine follow-up at the wound clinic and with Dr. Maryagnes AmosLeone 3. Follow-up here in 2 months if he desires   Cliffton AstersJohn Camerin Ladouceur, MD [facility] [phone] pager   [phone] cell 02/11/2015, 9:15 AM

## 2015-02-12 ENCOUNTER — Encounter (HOSPITAL_BASED_OUTPATIENT_CLINIC_OR_DEPARTMENT_OTHER): Payer: Medicaid Other | Attending: Internal Medicine

## 2015-02-12 DIAGNOSIS — M659 Synovitis and tenosynovitis, unspecified: Secondary | ICD-10-CM | POA: Diagnosis not present

## 2015-02-12 DIAGNOSIS — L02511 Cutaneous abscess of right hand: Secondary | ICD-10-CM | POA: Insufficient documentation

## 2015-02-12 DIAGNOSIS — M86641 Other chronic osteomyelitis, right hand: Secondary | ICD-10-CM | POA: Diagnosis not present

## 2015-02-22 DIAGNOSIS — L02511 Cutaneous abscess of right hand: Secondary | ICD-10-CM | POA: Diagnosis not present

## 2015-02-22 DIAGNOSIS — M86641 Other chronic osteomyelitis, right hand: Secondary | ICD-10-CM | POA: Diagnosis not present

## 2015-02-22 DIAGNOSIS — M659 Synovitis and tenosynovitis, unspecified: Secondary | ICD-10-CM | POA: Diagnosis not present

## 2015-02-23 DIAGNOSIS — M659 Synovitis and tenosynovitis, unspecified: Secondary | ICD-10-CM | POA: Diagnosis not present

## 2015-02-23 DIAGNOSIS — M86641 Other chronic osteomyelitis, right hand: Secondary | ICD-10-CM | POA: Diagnosis not present

## 2015-02-23 DIAGNOSIS — L02511 Cutaneous abscess of right hand: Secondary | ICD-10-CM | POA: Diagnosis not present

## 2015-02-24 DIAGNOSIS — M86641 Other chronic osteomyelitis, right hand: Secondary | ICD-10-CM | POA: Diagnosis not present

## 2015-02-25 DIAGNOSIS — M659 Synovitis and tenosynovitis, unspecified: Secondary | ICD-10-CM | POA: Diagnosis not present

## 2015-02-25 DIAGNOSIS — L02511 Cutaneous abscess of right hand: Secondary | ICD-10-CM | POA: Diagnosis not present

## 2015-02-25 DIAGNOSIS — M86641 Other chronic osteomyelitis, right hand: Secondary | ICD-10-CM | POA: Diagnosis not present

## 2015-02-26 DIAGNOSIS — L02511 Cutaneous abscess of right hand: Secondary | ICD-10-CM | POA: Diagnosis not present

## 2015-02-26 DIAGNOSIS — M659 Synovitis and tenosynovitis, unspecified: Secondary | ICD-10-CM | POA: Diagnosis not present

## 2015-02-26 DIAGNOSIS — M86641 Other chronic osteomyelitis, right hand: Secondary | ICD-10-CM | POA: Diagnosis not present

## 2015-03-02 DIAGNOSIS — L02511 Cutaneous abscess of right hand: Secondary | ICD-10-CM | POA: Diagnosis not present

## 2015-03-02 DIAGNOSIS — M86641 Other chronic osteomyelitis, right hand: Secondary | ICD-10-CM | POA: Diagnosis not present

## 2015-03-02 DIAGNOSIS — M659 Synovitis and tenosynovitis, unspecified: Secondary | ICD-10-CM | POA: Diagnosis not present

## 2015-03-03 DIAGNOSIS — M86641 Other chronic osteomyelitis, right hand: Secondary | ICD-10-CM | POA: Diagnosis not present

## 2015-03-04 DIAGNOSIS — L02511 Cutaneous abscess of right hand: Secondary | ICD-10-CM | POA: Diagnosis not present

## 2015-03-04 DIAGNOSIS — M86641 Other chronic osteomyelitis, right hand: Secondary | ICD-10-CM | POA: Diagnosis not present

## 2015-03-04 DIAGNOSIS — M659 Synovitis and tenosynovitis, unspecified: Secondary | ICD-10-CM | POA: Diagnosis not present

## 2015-03-05 DIAGNOSIS — M86641 Other chronic osteomyelitis, right hand: Secondary | ICD-10-CM | POA: Diagnosis not present

## 2015-03-05 DIAGNOSIS — M659 Synovitis and tenosynovitis, unspecified: Secondary | ICD-10-CM | POA: Diagnosis not present

## 2015-03-05 DIAGNOSIS — L02511 Cutaneous abscess of right hand: Secondary | ICD-10-CM | POA: Diagnosis not present

## 2015-03-09 DIAGNOSIS — M659 Synovitis and tenosynovitis, unspecified: Secondary | ICD-10-CM | POA: Diagnosis not present

## 2015-03-09 DIAGNOSIS — L02511 Cutaneous abscess of right hand: Secondary | ICD-10-CM | POA: Diagnosis not present

## 2015-03-09 DIAGNOSIS — M86641 Other chronic osteomyelitis, right hand: Secondary | ICD-10-CM | POA: Diagnosis not present

## 2015-03-10 ENCOUNTER — Encounter (HOSPITAL_BASED_OUTPATIENT_CLINIC_OR_DEPARTMENT_OTHER): Payer: Medicaid Other | Attending: Internal Medicine

## 2015-03-10 DIAGNOSIS — M86641 Other chronic osteomyelitis, right hand: Secondary | ICD-10-CM | POA: Insufficient documentation

## 2015-03-10 DIAGNOSIS — W19XXXD Unspecified fall, subsequent encounter: Secondary | ICD-10-CM | POA: Diagnosis not present

## 2015-03-10 DIAGNOSIS — S61401D Unspecified open wound of right hand, subsequent encounter: Secondary | ICD-10-CM | POA: Insufficient documentation

## 2015-03-10 DIAGNOSIS — M659 Synovitis and tenosynovitis, unspecified: Secondary | ICD-10-CM | POA: Diagnosis not present

## 2015-03-11 DIAGNOSIS — S61401D Unspecified open wound of right hand, subsequent encounter: Secondary | ICD-10-CM | POA: Diagnosis not present

## 2015-03-11 DIAGNOSIS — M86641 Other chronic osteomyelitis, right hand: Secondary | ICD-10-CM | POA: Diagnosis not present

## 2015-03-11 DIAGNOSIS — M659 Synovitis and tenosynovitis, unspecified: Secondary | ICD-10-CM | POA: Diagnosis not present

## 2015-03-12 DIAGNOSIS — S61401D Unspecified open wound of right hand, subsequent encounter: Secondary | ICD-10-CM | POA: Diagnosis not present

## 2015-03-12 DIAGNOSIS — M86641 Other chronic osteomyelitis, right hand: Secondary | ICD-10-CM | POA: Diagnosis not present

## 2015-03-12 DIAGNOSIS — M659 Synovitis and tenosynovitis, unspecified: Secondary | ICD-10-CM | POA: Diagnosis not present

## 2015-03-16 DIAGNOSIS — M659 Synovitis and tenosynovitis, unspecified: Secondary | ICD-10-CM | POA: Diagnosis not present

## 2015-03-16 DIAGNOSIS — M86641 Other chronic osteomyelitis, right hand: Secondary | ICD-10-CM | POA: Diagnosis not present

## 2015-03-16 DIAGNOSIS — S61401D Unspecified open wound of right hand, subsequent encounter: Secondary | ICD-10-CM | POA: Diagnosis not present

## 2015-03-17 DIAGNOSIS — M86641 Other chronic osteomyelitis, right hand: Secondary | ICD-10-CM | POA: Diagnosis not present

## 2015-03-18 DIAGNOSIS — S61401D Unspecified open wound of right hand, subsequent encounter: Secondary | ICD-10-CM | POA: Diagnosis not present

## 2015-03-18 DIAGNOSIS — M659 Synovitis and tenosynovitis, unspecified: Secondary | ICD-10-CM | POA: Diagnosis not present

## 2015-03-18 DIAGNOSIS — M86641 Other chronic osteomyelitis, right hand: Secondary | ICD-10-CM | POA: Diagnosis not present

## 2015-03-19 DIAGNOSIS — M86641 Other chronic osteomyelitis, right hand: Secondary | ICD-10-CM | POA: Diagnosis not present

## 2015-03-19 DIAGNOSIS — S61401D Unspecified open wound of right hand, subsequent encounter: Secondary | ICD-10-CM | POA: Diagnosis not present

## 2015-03-19 DIAGNOSIS — M659 Synovitis and tenosynovitis, unspecified: Secondary | ICD-10-CM | POA: Diagnosis not present

## 2015-03-22 DIAGNOSIS — M86641 Other chronic osteomyelitis, right hand: Secondary | ICD-10-CM | POA: Diagnosis not present

## 2015-03-22 DIAGNOSIS — M659 Synovitis and tenosynovitis, unspecified: Secondary | ICD-10-CM | POA: Diagnosis not present

## 2015-03-22 DIAGNOSIS — S61401D Unspecified open wound of right hand, subsequent encounter: Secondary | ICD-10-CM | POA: Diagnosis not present

## 2015-03-23 DIAGNOSIS — M659 Synovitis and tenosynovitis, unspecified: Secondary | ICD-10-CM | POA: Diagnosis not present

## 2015-03-23 DIAGNOSIS — S61401D Unspecified open wound of right hand, subsequent encounter: Secondary | ICD-10-CM | POA: Diagnosis not present

## 2015-03-23 DIAGNOSIS — M86641 Other chronic osteomyelitis, right hand: Secondary | ICD-10-CM | POA: Diagnosis not present

## 2015-03-24 DIAGNOSIS — M86641 Other chronic osteomyelitis, right hand: Secondary | ICD-10-CM | POA: Diagnosis not present

## 2015-03-25 DIAGNOSIS — M86641 Other chronic osteomyelitis, right hand: Secondary | ICD-10-CM | POA: Diagnosis not present

## 2015-03-25 DIAGNOSIS — S61401D Unspecified open wound of right hand, subsequent encounter: Secondary | ICD-10-CM | POA: Diagnosis not present

## 2015-03-25 DIAGNOSIS — M659 Synovitis and tenosynovitis, unspecified: Secondary | ICD-10-CM | POA: Diagnosis not present

## 2015-03-29 DIAGNOSIS — M86641 Other chronic osteomyelitis, right hand: Secondary | ICD-10-CM | POA: Diagnosis not present

## 2015-03-29 DIAGNOSIS — S61401D Unspecified open wound of right hand, subsequent encounter: Secondary | ICD-10-CM | POA: Diagnosis not present

## 2015-03-29 DIAGNOSIS — M659 Synovitis and tenosynovitis, unspecified: Secondary | ICD-10-CM | POA: Diagnosis not present

## 2015-04-01 DIAGNOSIS — S61401D Unspecified open wound of right hand, subsequent encounter: Secondary | ICD-10-CM | POA: Diagnosis not present

## 2015-04-01 DIAGNOSIS — M659 Synovitis and tenosynovitis, unspecified: Secondary | ICD-10-CM | POA: Diagnosis not present

## 2015-04-01 DIAGNOSIS — M86641 Other chronic osteomyelitis, right hand: Secondary | ICD-10-CM | POA: Diagnosis not present

## 2015-04-02 DIAGNOSIS — S61401D Unspecified open wound of right hand, subsequent encounter: Secondary | ICD-10-CM | POA: Diagnosis not present

## 2015-04-02 DIAGNOSIS — M86641 Other chronic osteomyelitis, right hand: Secondary | ICD-10-CM | POA: Diagnosis not present

## 2015-04-02 DIAGNOSIS — M659 Synovitis and tenosynovitis, unspecified: Secondary | ICD-10-CM | POA: Diagnosis not present

## 2015-04-06 ENCOUNTER — Ambulatory Visit: Payer: Medicaid Other | Admitting: Internal Medicine

## 2015-04-06 ENCOUNTER — Telehealth: Payer: Self-pay | Admitting: *Deleted

## 2015-04-06 NOTE — Telephone Encounter (Signed)
Scheduled the pt for a new MD appt.

## 2015-04-28 ENCOUNTER — Ambulatory Visit: Payer: Medicaid Other | Admitting: Internal Medicine

## 2018-02-07 ENCOUNTER — Emergency Department (HOSPITAL_COMMUNITY)
Admission: EM | Admit: 2018-02-07 | Discharge: 2018-02-07 | Disposition: A | Discharge status: Home / Self Care | Payer: Medicaid Other | Attending: Emergency Medicine | Admitting: Emergency Medicine

## 2018-02-07 ENCOUNTER — Encounter (HOSPITAL_COMMUNITY): Payer: Self-pay

## 2018-02-07 ENCOUNTER — Other Ambulatory Visit: Payer: Self-pay

## 2018-02-07 ENCOUNTER — Emergency Department (HOSPITAL_COMMUNITY): Payer: Medicaid Other

## 2018-02-07 DIAGNOSIS — Y929 Unspecified place or not applicable: Secondary | ICD-10-CM | POA: Insufficient documentation

## 2018-02-07 DIAGNOSIS — Y939 Activity, unspecified: Secondary | ICD-10-CM | POA: Insufficient documentation

## 2018-02-07 DIAGNOSIS — S39012A Strain of muscle, fascia and tendon of lower back, initial encounter: Secondary | ICD-10-CM | POA: Insufficient documentation

## 2018-02-07 DIAGNOSIS — I10 Essential (primary) hypertension: Secondary | ICD-10-CM | POA: Insufficient documentation

## 2018-02-07 DIAGNOSIS — Y999 Unspecified external cause status: Secondary | ICD-10-CM | POA: Insufficient documentation

## 2018-02-07 DIAGNOSIS — F17210 Nicotine dependence, cigarettes, uncomplicated: Secondary | ICD-10-CM | POA: Insufficient documentation

## 2018-02-07 DIAGNOSIS — Z79899 Other long term (current) drug therapy: Secondary | ICD-10-CM | POA: Diagnosis not present

## 2018-02-07 DIAGNOSIS — Z041 Encounter for examination and observation following transport accident: Secondary | ICD-10-CM | POA: Diagnosis present

## 2018-02-07 MED ORDER — CYCLOBENZAPRINE HCL 10 MG PO TABS: 10.0000 mg | ORAL_TABLET | Freq: Two times a day (BID) | ORAL | 0 refills | Status: DC | PRN

## 2018-02-07 MED ORDER — HYDROCODONE-ACETAMINOPHEN 5-325 MG PO TABS
1.0000 | ORAL_TABLET | Freq: Once | ORAL | Status: AC
  Administered 2018-02-07: 1 via ORAL
  Filled 2018-02-07: qty 1

## 2018-02-07 MED ORDER — LIDOCAINE 5 % EX PTCH: 1.0000 | MEDICATED_PATCH | CUTANEOUS | 0 refills | Status: DC

## 2018-02-07 NOTE — ED Notes (Signed)
Bed: [facility] Expected date:  Expected time:  Means of arrival:  Comments: 
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
## 2018-03-27 ENCOUNTER — Other Ambulatory Visit: Payer: Self-pay | Admitting: Nurse Practitioner

## 2018-03-27 DIAGNOSIS — N281 Cyst of kidney, acquired: Secondary | ICD-10-CM

## 2018-06-25 ENCOUNTER — Ambulatory Visit: Payer: Medicaid Other

## 2018-07-01 ENCOUNTER — Ambulatory Visit: Payer: Medicaid Other

## 2018-07-01 ENCOUNTER — Ambulatory Visit
Admission: RE | Admit: 2018-07-01 | Discharge: 2018-07-01 | Disposition: A | Discharge status: Home / Self Care | Payer: Medicaid Other | Source: Ambulatory Visit | Attending: Nurse Practitioner | Admitting: Nurse Practitioner

## 2018-07-01 DIAGNOSIS — N281 Cyst of kidney, acquired: Secondary | ICD-10-CM | POA: Diagnosis not present

## 2018-10-09 HISTORY — PX: NM MYOVIEW LTD: HXRAD82

## 2019-06-30 ENCOUNTER — Other Ambulatory Visit: Payer: Self-pay

## 2019-06-30 ENCOUNTER — Emergency Department
Admission: EM | Admit: 2019-06-30 | Discharge: 2019-06-30 | Disposition: A | Discharge status: Home / Self Care | Payer: Medicaid Other | Attending: Emergency Medicine | Admitting: Emergency Medicine

## 2019-06-30 ENCOUNTER — Emergency Department: Payer: Medicaid Other

## 2019-06-30 ENCOUNTER — Encounter: Payer: Self-pay | Admitting: Emergency Medicine

## 2019-06-30 DIAGNOSIS — Z7982 Long term (current) use of aspirin: Secondary | ICD-10-CM | POA: Insufficient documentation

## 2019-06-30 DIAGNOSIS — I10 Essential (primary) hypertension: Secondary | ICD-10-CM | POA: Insufficient documentation

## 2019-06-30 DIAGNOSIS — G479 Sleep disorder, unspecified: Secondary | ICD-10-CM | POA: Insufficient documentation

## 2019-06-30 DIAGNOSIS — Z20828 Contact with and (suspected) exposure to other viral communicable diseases: Secondary | ICD-10-CM | POA: Diagnosis not present

## 2019-06-30 DIAGNOSIS — F17210 Nicotine dependence, cigarettes, uncomplicated: Secondary | ICD-10-CM | POA: Insufficient documentation

## 2019-06-30 DIAGNOSIS — F419 Anxiety disorder, unspecified: Secondary | ICD-10-CM | POA: Insufficient documentation

## 2019-06-30 DIAGNOSIS — Z79899 Other long term (current) drug therapy: Secondary | ICD-10-CM | POA: Diagnosis not present

## 2019-06-30 DIAGNOSIS — R0602 Shortness of breath: Secondary | ICD-10-CM | POA: Diagnosis present

## 2019-06-30 DIAGNOSIS — J069 Acute upper respiratory infection, unspecified: Secondary | ICD-10-CM

## 2019-06-30 LAB — COMPREHENSIVE METABOLIC PANEL
ALT: 73 U/L — ABNORMAL HIGH (ref 0–44)
AST: 66 U/L — ABNORMAL HIGH (ref 15–41)
Albumin: 4.3 g/dL (ref 3.5–5.0)
Alkaline Phosphatase: 64 U/L (ref 38–126)
Anion gap: 12 (ref 5–15)
BUN: 13 mg/dL (ref 6–20)
CO2: 22 mmol/L (ref 22–32)
Calcium: 8.9 mg/dL (ref 8.9–10.3)
Chloride: 101 mmol/L (ref 98–111)
Creatinine, Ser: 0.97 mg/dL (ref 0.61–1.24)
GFR calc Af Amer: >60 mL/min (ref >60)
GFR calc non Af Amer: >60 mL/min (ref >60)
Glucose, Bld: 104 mg/dL — ABNORMAL HIGH (ref 70–99)
Potassium: 4.3 mmol/L (ref 3.5–5.1)
Sodium: 135 mmol/L (ref 135–145)
Total Bilirubin: 0.5 mg/dL (ref 0.3–1.2)
Total Protein: 7.3 g/dL (ref 6.5–8.1)

## 2019-06-30 LAB — CBC
HCT: 38.7 % — ABNORMAL LOW (ref 39.0–52.0)
Hemoglobin: 12.9 g/dL — ABNORMAL LOW (ref 13.0–17.0)
MCH: 29 pg (ref 26.0–34.0)
MCHC: 33.3 g/dL (ref 30.0–36.0)
MCV: 87 fL (ref 80.0–100.0)
Platelets: 243 10*3/uL (ref 150–400)
RBC: 4.45 MIL/uL (ref 4.22–5.81)
RDW: 13.7 % (ref 11.5–15.5)
WBC: 10.1 10*3/uL (ref 4.0–10.5)
nRBC: 0 % (ref 0.0–0.2)

## 2019-06-30 LAB — URINALYSIS, COMPLETE (UACMP) WITH MICROSCOPIC
Bacteria, UA: NONE SEEN
Bilirubin Urine: NEGATIVE
Glucose, UA: NEGATIVE mg/dL
Hgb urine dipstick: NEGATIVE
Ketones, ur: NEGATIVE mg/dL
Leukocytes,Ua: NEGATIVE
Nitrite: NEGATIVE
Protein, ur: NEGATIVE mg/dL
Specific Gravity, Urine: 1.015 (ref 1.005–1.030)
Squamous Epithelial / HPF: NONE SEEN (ref 0–5)
pH: 6 (ref 5.0–8.0)

## 2019-06-30 LAB — URINE DRUG SCREEN, QUALITATIVE (ARMC ONLY)
Amphetamines, Ur Screen: NOT DETECTED
Barbiturates, Ur Screen: NOT DETECTED
Benzodiazepine, Ur Scrn: NOT DETECTED
Cannabinoid 50 Ng, Ur ~~LOC~~: NOT DETECTED
Cocaine Metabolite,Ur ~~LOC~~: POSITIVE — AB
MDMA (Ecstasy)Ur Screen: NOT DETECTED
Methadone Scn, Ur: NOT DETECTED
Opiate, Ur Screen: NOT DETECTED
Phencyclidine (PCP) Ur S: NOT DETECTED
Tricyclic, Ur Screen: NOT DETECTED

## 2019-06-30 LAB — FIBRIN DERIVATIVES D-DIMER (ARMC ONLY): Fibrin derivatives D-dimer (ARMC): 277.49 ng/mL (FEU) (ref 0.00–499.00)

## 2019-06-30 LAB — TROPONIN I (HIGH SENSITIVITY): Troponin I (High Sensitivity): 6 ng/L (ref <18)

## 2019-06-30 LAB — BRAIN NATRIURETIC PEPTIDE: B Natriuretic Peptide: 39 pg/mL (ref 0.0–100.0)

## 2019-06-30 LAB — LIPASE, BLOOD: Lipase: 52 U/L — ABNORMAL HIGH (ref 11–51)

## 2019-06-30 MED ORDER — HYDROXYZINE HCL 25 MG PO TABS: 25.0000 mg | ORAL_TABLET | Freq: Three times a day (TID) | ORAL | 0 refills | Status: DC | PRN

## 2019-06-30 MED ORDER — AZITHROMYCIN 250 MG PO TABS: ORAL_TABLET | ORAL | 0 refills | Status: DC

## 2019-06-30 MED ORDER — LORAZEPAM 0.5 MG PO TABS
0.5000 mg | ORAL_TABLET | Freq: Once | ORAL | Status: AC
  Administered 2019-06-30: 15:00:00 0.5 mg via ORAL
  Filled 2019-06-30: qty 1

## 2019-06-30 NOTE — Discharge Instructions (Addendum)
Your lab work is reassuring.  Please follow-up with primary care for recheck of your lab work.  We are treating you for an upper respiratory infection with azithromycin.  Your COVID test is pending.  You can take Atarax up to 3 times per day for anxiety.  Please follow-up with psychiatry this week.

## 2019-06-30 NOTE — ED Provider Notes (Addendum)
[facility] Emergency Department Provider Note  ____________________________________________  Time seen: Approximately 12:48 PM  I have reviewed the triage vital signs and the nursing notes.   HISTORY  Chief Complaint Generalized Body Aches    HPI Bryan Huffman is a 48 y.o. male that presents to the emergency department for evaluation of difficulty sleeping, shortness of breath, chest discomfort, back pain, worsening anxiety for 2 months and body aches, chills, cough for 2 days.  Patient states that shortness of breath seem to be worse at night when he is laying down and putting his CPAP on.  He is not sure if he feels worse at night because his anxiety is worse.  Chest feels tight and seems worse when he is more anxious. Body aches started yesterday.  He has a history of anxiety and depression and has been started on several medications, and feels he needs something stronger.   He has nasal congestion daily.  He has not been working and has been gaining weight due to staying at home and not exercising.  He used to see psychiatry and used to have worse anxiety and depression with panic attacks when he was going through difficult life events.  This feels very similar.  He has had appointments with psychiatry but has missed them because he did not want to leave the house.  He does not have motivation to do much currently.  No suicidal or homicidal ideations.  He used to use several drugs but states he is "usually better" now.  He also used to be a heavy drinker but does not drink much alcohol anymore.  Sometimes he drinks to help himself fall asleep.  He only sleeps about 2 hours per night currently.   Past Medical History:  Diagnosis Date  . Anxiety   . Arthritis   . Back pain   . Depression   . GERD (gastroesophageal reflux disease)   . Hemorrhoid   . Hypertension   . Insomnia   . Mental disorder   . Neuromuscular disorder (HCC)    left leg pain - 4 back  surgeries    Patient Active Problem List   Diagnosis Date Noted  . Polysubstance abuse (HCC) 09/02/2014  . History of suicidal ideation 09/02/2014  . HTN (hypertension) 09/02/2014  . GERD (gastroesophageal reflux disease) 09/02/2014  . Cigarette smoker 09/02/2014  . 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 History of pancreatitis 09/02/2014  . DDD (degenerative disc disease), lumbar 10/15/2013  . Alcohol dependence (HCC) 07/26/2013  . Back pain 07/26/2013  . MDD (major depressive disorder) 07/26/2013    Past Surgical History:  Procedure Laterality Date  . ANTERIOR LAT LUMBAR FUSION N/A 01/29/2014   Procedure: X LIF L2-L3 WITH LATERAL PLATES -ANTERIOR LATERAL LUMBAR FUSION 1 LEVEL;  Surgeon: Venita Lickahari Brooks, MD;  Location: [facility] OR;  Service: Orthopedics;  Laterality: N/A;  . BACK SURGERY  1998,2008   L3 L4 L5 fusion  . I&D EXTREMITY Right 09/07/2014   Procedure: DEBRIDEMENT RIGHT HAND EXTENSOR TENOSYNOVECTOMY;  Surgeon: Jodi Marbleavid A Thompson, MD;  Location: [facility];  Service: Orthopedics;  Laterality: Right;  . I&D EXTREMITY Right 12/14/2014   Procedure: IRRIGATION AND DEBRIDEMENT EXTREMITY RIGHT HAND;  Surgeon: Mack Hookavid Thompson, MD;  Location:  SURGERY CENTER;  Service: Orthopedics;  Laterality: Right;  RRIGATION AND DEBRIDEMENT EXTREMITY RIGHT HAND  . LUMBAR LAMINECTOMY/DECOMPRESSION MICRODISCECTOMY Left 10/15/2013   Procedure: LUMBAR 2-3 LEFT DISCECTOMY;  Surgeon: Venita Lickahari Brooks, MD;  [facility];  Service:  Orthopedics;  Laterality: Left;  . LUMBAR SPINE SURGERY  10/15/2013   L 2 L3  DISECTOMY  . SPINE SURGERY    . TONSILLECTOMY      Prior to Admission medications   Medication Sig Start Date End Date Taking? Authorizing Provider  aspirin 81 MG tablet Take 81 mg by mouth daily.    [provider]  azithromycin (ZITHROMAX Z-PAK) 250 MG tablet Take 2 tablets (500 mg) on  Day 1,  followed by 1 tablet (250 mg) once daily on Days 2 through 5. 06/30/19    Laban Emperor, PA-C  clarithromycin (BIAXIN) 500 MG tablet Take 500 mg by mouth 2 (two) times daily.    [provider]  cyclobenzaprine (FLEXERIL) 10 MG tablet Take 1 tablet (10 mg total) by mouth 2 (two) times daily as needed for muscle spasms. 02/07/18   Khatri, Hina, PA-C  hydrochlorothiazide (MICROZIDE) 12.5 MG capsule Take 1 capsule (12.5 mg total) by mouth daily. 11/21/13   Withrow, Elyse Jarvis, FNP  hydrOXYzine (ATARAX/VISTARIL) 25 MG tablet Take 1 tablet (25 mg total) by mouth 3 (three) times daily as needed. 06/30/19   Laban Emperor, PA-C  lidocaine (LIDODERM) 5 % Place 1 patch onto the skin daily. Remove & Discard patch within 12 hours or as directed by MD 02/07/18   Delia Heady, PA-C  lisinopril (PRINIVIL,ZESTRIL) 10 MG tablet Take 1 tablet (10 mg total) by mouth daily. 11/21/13   Withrow, Elyse Jarvis, FNP  metoprolol succinate (TOPROL-XL) 100 MG 24 hr tablet Take 1 tablet (100 mg total) by mouth daily. Take with or immediately following a meal. 11/21/13   Withrow, Elyse Jarvis, FNP  moxifloxacin (AVELOX) 400 MG tablet Take 400 mg by mouth daily at 8 pm.    [provider]  omeprazole (PRILOSEC) 20 MG capsule Take 1 capsule (20 mg total) by mouth daily. 07/30/13   Niel Hummer, NP  oxyCODONE-acetaminophen (PERCOCET/ROXICET) 5-325 MG per tablet Take 1-2 tablets by mouth every 4 (four) hours as needed for severe pain. 01/12/15   Michel Bickers, MD  simvastatin (ZOCOR) 10 MG tablet Take 10 mg by mouth daily.    [provider]  traZODone (DESYREL) 150 MG tablet Take 1 tablet (150 mg total) by mouth at bedtime as needed for sleep. 11/21/13   Withrow, Elyse Jarvis, FNP    Allergies Bee venom  Family History  Problem Relation Age of Onset  . Hyperlipidemia Father     Social History Social History   Tobacco Use  . Smoking status: Current Every Day Smoker    Packs/day: 0.50    Years: 18.00    Pack years: 9.00    Types: Cigarettes  . Smokeless tobacco: Never Used  . Tobacco  comment: cutting back  Substance Use Topics  . Alcohol use: Yes    Alcohol/week: 2.0 standard drinks    Types: 2 Cans of beer per week    Comment: none since 09/02/14  . Drug use: Yes    Types: Marijuana, Oxycodone, "Crack" cocaine    Comment: crack-yrs,no marajuana ( last used 6-7 yrs ago)     Review of Systems  Constitutional: Positive for chills. Eyes: No visual changes. No discharge. ENT: Positive for congestion and rhinorrhea. Cardiovascular: Positive for chest discomfort. Respiratory: Positive for cough and SOB. Gastrointestinal: No abdominal pain.  No nausea, no vomiting.  No diarrhea.  No constipation. Musculoskeletal: Positive for body aches. Skin: Negative for rash, abrasions, lacerations, ecchymosis. Neurological: Negative for headaches.   ____________________________________________  PHYSICAL EXAM:  VITAL SIGNS: ED Triage Vitals  Enc Vitals Group     BP 06/30/19 1052 (!) 145/86     Pulse Rate 06/30/19 1052 75     Resp 06/30/19 1052 20     Temp 06/30/19 1053 98.6 F (37 C)     Temp Source 06/30/19 1052 Oral     SpO2 06/30/19 1052 100 %     Weight 06/30/19 1050 260 lb (117.9 kg)     Height 06/30/19 1050 5\' 8"  (1.727 m)     Head Circumference --      Peak Flow --      Pain Score 06/30/19 1050 7     Pain Loc --      Pain Edu? --      Excl. in [place]? --      Constitutional: Alert and oriented. Well appearing and in no acute distress. Eyes: Conjunctivae are normal. PERRL. EOMI. No discharge. Head: Atraumatic. ENT: No frontal and maxillary sinus tenderness.      Ears: Tympanic membranes pearly gray with good landmarks. No discharge.      Nose: Mild congestion/rhinnorhea.      Mouth/Throat: Mucous membranes are moist. Oropharynx non-erythematous. Tonsils not enlarged. No exudates. Uvula midline. Neck: No stridor.   Hematological/Lymphatic/Immunilogical: No cervical lymphadenopathy. Cardiovascular: Normal rate, regular rhythm.  Good peripheral  circulation. Respiratory: Normal respiratory effort without tachypnea or retractions. Lungs CTAB. Good air entry to the bases with no decreased or absent breath sounds. Gastrointestinal: Bowel sounds 4 quadrants. Soft and nontender to palpation. No guarding or rigidity. No palpable masses. No distention. Musculoskeletal: Full range of motion to all extremities. No gross deformities appreciated. Neurologic:  Normal speech and language. No gross focal neurologic deficits are appreciated.  Skin:  Skin is warm, dry and intact. No rash noted. Psychiatric: Mood and affect are normal. Speech and behavior are normal. Patient exhibits appropriate insight and judgement.   ____________________________________________   LABS (all labs ordered are listed, but only abnormal results are displayed)  Labs Reviewed  CBC - Abnormal; Notable for the following components:      Result Value   Hemoglobin 12.9 (*)    HCT 38.7 (*)    All other components within normal limits  COMPREHENSIVE METABOLIC PANEL - Abnormal; Notable for the following components:   Glucose, Bld 104 (*)    AST 66 (*)    ALT 73 (*)    All other components within normal limits  LIPASE, BLOOD - Abnormal; Notable for the following components:   Lipase 52 (*)    All other components within normal limits  URINALYSIS, COMPLETE (UACMP) WITH MICROSCOPIC - Abnormal; Notable for the following components:   Color, Urine YELLOW (*)    APPearance CLEAR (*)    All other components within normal limits  URINE DRUG SCREEN, QUALITATIVE ([facility] ONLY) - Abnormal; Notable for the following components:   Cocaine Metabolite,[place] POSITIVE (*)    All other components within normal limits  NOVEL CORONAVIRUS, NAA (HOSP ORDER, SEND-OUT TO REF LAB; TAT 18-24 HRS)  BRAIN NATRIURETIC PEPTIDE  FIBRIN DERIVATIVES D-DIMER ([facility] ONLY)  TROPONIN I (HIGH SENSITIVITY)    ____________________________________________  EKG  NSR ____________________________________________  RADIOLOGY 07/02/19, personally viewed and evaluated these images (plain radiographs) as part of my medical decision making, as well as reviewing the written report by the radiologist.  Dg Chest Portable 1 View  Result Date: 06/30/2019 CLINICAL DATA:  Cough, body aches. EXAM: PORTABLE CHEST 1 VIEW COMPARISON:  10/10/2013. FINDINGS: Mediastinum hilar structures normal. Low lung volumes with mild bibasilar atelectasis. No pleural effusion or pneumothorax. No acute bony abnormality. IMPRESSION: Lung volumes with mild bibasilar atelectasis. Electronically Signed   By: Maisie Fus  Register   On: 06/30/2019 11:27    ____________________________________________    PROCEDURES  Procedure(s) performed:    Procedures    Medications  LORazepam (ATIVAN) tablet 0.5 mg (0.5 mg Oral Given 06/30/19 1448)     ____________________________________________   INITIAL IMPRESSION / ASSESSMENT AND PLAN / ED COURSE  Pertinent labs & imaging results that were available during my care of the patient were reviewed by me and considered in my medical decision making (see chart for details).  Review of the [place] CSRS was performed in accordance of the [facility] prior to dispensing any controlled drugs.   Patient's diagnosis is consistent with anxiety, difficulty sleeping, upper respiratory tract infection. Vital signs and exam are reassuring.  Chest x-ray consistent with mild bibasilar atelectasis.  EKG shows normal sinus rhythm.  Troponin, d-dimer, BNP are within normal limits.  CMP shows mildly elevated liver enzymes and lipase, for which patient will follow-up with primary care for.  No abdominal pain or tenderness to palpation.  Urine drug screen positive for cocaine, which is likely contributing to symptoms.  Symptoms improved with Atarax. Patient feels comfortable going home. Patient will be discharged  home with prescriptions for hydroxyzine, azithromycin. Patient is to follow up with primary care and psychiatry as needed or otherwise directed.  Patient has resources for psychiatry and is agreeable to call tomorrow morning to reschedule his appointment.  Patient is given ED precautions to return to the ED for any worsening or new symptoms.  Bryan Huffman was evaluated in Emergency Department on 06/30/2019 for the symptoms described in the history of present illness. He was evaluated in the context of the global COVID-19 pandemic, which necessitated consideration that the patient might be at risk for infection with the SARS-CoV-2 virus that causes COVID-19. Institutional protocols and algorithms that pertain to the evaluation of patients at risk for COVID-19 are in a state of rapid change based on information released by regulatory bodies including the CDC and federal and state organizations. These policies and algorithms were followed during the patient's care in the ED.   ____________________________________________  FINAL CLINICAL IMPRESSION(S) / ED DIAGNOSES  Final diagnoses:  Upper respiratory tract infection, unspecified type  Difficulty sleeping  Anxiety      NEW MEDICATIONS STARTED DURING THIS VISIT:  ED Discharge Orders         Ordered    hydrOXYzine (ATARAX/VISTARIL) 25 MG tablet  3 times daily PRN,   Status:  Discontinued     06/30/19 1624    azithromycin (ZITHROMAX Z-PAK) 250 MG tablet  Status:  Discontinued    Note to Pharmacy: Bronchitis   06/30/19 1624    azithromycin (ZITHROMAX Z-PAK) 250 MG tablet    Note to Pharmacy: Bronchitis   06/30/19 1652    hydrOXYzine (ATARAX/VISTARIL) 25 MG tablet  3 times daily PRN     06/30/19 1652              This chart was dictated using voice recognition software/Dragon. Despite best efforts to proofread, errors can occur which can change the meaning. Any change was purely unintentional.    Enid Derry, PA-C 06/30/19  1910    Enid Derry, PA-C 06/30/19 1910    Concha Se, MD 07/01/19 [phone]

## 2019-06-30 NOTE — ED Notes (Addendum)
See triage note  Presents with body aches and chills for a few days   Afebrile on arrival  Hx of anxiety and feels like he is heaviness in chest    Lack of motivation

## 2019-06-30 NOTE — ED Triage Notes (Addendum)
Pt c.o cough, generalized body aches and chills x few days. States SOB, A&OX4, RR even and unlabored , pt speaking in full sentences.

## 2019-07-02 LAB — NOVEL CORONAVIRUS, NAA (HOSP ORDER, SEND-OUT TO REF LAB; TAT 18-24 HRS): SARS-CoV-2, NAA: NOT DETECTED

## 2019-07-03 ENCOUNTER — Other Ambulatory Visit: Payer: Self-pay

## 2019-07-03 ENCOUNTER — Encounter: Payer: Self-pay | Admitting: Emergency Medicine

## 2019-07-03 DIAGNOSIS — I10 Essential (primary) hypertension: Secondary | ICD-10-CM | POA: Diagnosis not present

## 2019-07-03 DIAGNOSIS — Z79899 Other long term (current) drug therapy: Secondary | ICD-10-CM | POA: Insufficient documentation

## 2019-07-03 DIAGNOSIS — Z7982 Long term (current) use of aspirin: Secondary | ICD-10-CM | POA: Insufficient documentation

## 2019-07-03 DIAGNOSIS — F410 Panic disorder [episodic paroxysmal anxiety] without agoraphobia: Secondary | ICD-10-CM | POA: Insufficient documentation

## 2019-07-03 DIAGNOSIS — Z87891 Personal history of nicotine dependence: Secondary | ICD-10-CM | POA: Insufficient documentation

## 2019-07-03 DIAGNOSIS — F419 Anxiety disorder, unspecified: Secondary | ICD-10-CM | POA: Insufficient documentation

## 2019-07-03 NOTE — ED Triage Notes (Signed)
Pt in via POV, reports ongoing anxiety x 3 months, having frequent panic attacks, unable to sleep.  Reports being prescribed vistaril and seroquel without any relief.  Pt hyperventilating in triage.

## 2019-07-04 ENCOUNTER — Emergency Department
Admission: EM | Admit: 2019-07-04 | Discharge: 2019-07-04 | Disposition: A | Discharge status: Home / Self Care | Payer: Medicaid Other | Attending: Emergency Medicine | Admitting: Emergency Medicine

## 2019-07-04 DIAGNOSIS — F410 Panic disorder [episodic paroxysmal anxiety] without agoraphobia: Secondary | ICD-10-CM

## 2019-07-04 MED ORDER — LORAZEPAM 0.5 MG PO TABS: 0.5000 mg | ORAL_TABLET | Freq: Three times a day (TID) | ORAL | 0 refills | Status: AC | PRN

## 2019-07-04 MED ORDER — LORAZEPAM 1 MG PO TABS
1.0000 mg | ORAL_TABLET | Freq: Once | ORAL | Status: AC
  Administered 2019-07-04: 01:00:00 1 mg via ORAL
  Filled 2019-07-04: qty 1

## 2019-07-04 NOTE — ED Provider Notes (Signed)
[facility] Emergency Department Provider Note   First MD Initiated Contact with Patient 07/04/19 0034     (approximate)  I have reviewed the triage vital signs and the nursing notes.   HISTORY  Chief Complaint Panic Attack   HPI Bryan Huffman is a 48 y.o. male with below list of previous medical conditions presents to the emergency department secondary to "panic attack".  Patient states that he has had worsening Bryan Huffman over the past 3 months for which he was prescribed hydroxyzine and Seroquel.  Patient states that he is taking it as prescribed however has had no relief.  Patient states anxiety tonight is worse and is ever been".        Past Medical History:  Diagnosis Date  . Anxiety   . Arthritis   . Back pain   . Depression   . GERD (gastroesophageal reflux disease)   . Hemorrhoid   . Hypertension   . Insomnia   . Mental disorder   . Neuromuscular disorder (HCC)    left leg pain - 4 back surgeries    Patient Active Problem List   Diagnosis Date Noted  . Polysubstance abuse (HCC) 09/02/2014  . History of suicidal ideation 09/02/2014  . HTN (hypertension) 09/02/2014  . GERD (gastroesophageal reflux disease) 09/02/2014  . Cigarette smoker 09/02/2014  . 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 History of pancreatitis 09/02/2014  . DDD (degenerative disc disease), lumbar 10/15/2013  . Alcohol dependence (HCC) 07/26/2013  . Back pain 07/26/2013  . MDD (major depressive disorder) 07/26/2013    Past Surgical History:  Procedure Laterality Date  . ANTERIOR LAT LUMBAR FUSION N/A 01/29/2014   Procedure: X LIF L2-L3 WITH LATERAL PLATES -ANTERIOR LATERAL LUMBAR FUSION 1 LEVEL;  Surgeon: Venita Lick, MD;  Location: [facility] OR;  Service: Orthopedics;  Laterality: N/A;  . BACK SURGERY  1998,2008   L3 L4 L5 fusion  . I&D EXTREMITY Right 09/07/2014   Procedure: DEBRIDEMENT RIGHT HAND EXTENSOR TENOSYNOVECTOMY;  Surgeon: Jodi Marble, MD;   Location: [facility];  Service: Orthopedics;  Laterality: Right;  . I&D EXTREMITY Right 12/14/2014   Procedure: IRRIGATION AND DEBRIDEMENT EXTREMITY RIGHT HAND;  Surgeon: Mack Hook, MD;  Location: [facility];  Service: Orthopedics;  Laterality: Right;  RRIGATION AND DEBRIDEMENT EXTREMITY RIGHT HAND  . LUMBAR LAMINECTOMY/DECOMPRESSION MICRODISCECTOMY Left 10/15/2013   Procedure: LUMBAR 2-3 LEFT DISCECTOMY;  Surgeon: Venita Lick, MD;  Location: [facility] OR;  Service: Orthopedics;  Laterality: Left;  . LUMBAR SPINE SURGERY  10/15/2013   L 2 L3  DISECTOMY  . SPINE SURGERY    . TONSILLECTOMY      Prior to Admission medications   Medication Sig Start Date End Date Taking? Authorizing Provider  aspirin 81 MG tablet Take 81 mg by mouth daily.    [provider]  azithromycin (ZITHROMAX Z-PAK) 250 MG tablet Take 2 tablets (500 mg) on  Day 1,  followed by 1 tablet (250 mg) once daily on Days 2 through 5. 06/30/19   Enid Derry, PA-C  clarithromycin (BIAXIN) 500 MG tablet Take 500 mg by mouth 2 (two) times daily.    [provider]  cyclobenzaprine (FLEXERIL) 10 MG tablet Take 1 tablet (10 mg total) by mouth 2 (two) times daily as needed for muscle spasms. 02/07/18   Khatri, Hina, PA-C  hydrochlorothiazide (MICROZIDE) 12.5 MG capsule Take 1 capsule (12.5 mg total) by mouth daily. 11/21/13   Withrow, Everardo All, FNP  hydrOXYzine (  ATARAX/VISTARIL) 25 MG tablet Take 1 tablet (25 mg total) by mouth 3 (three) times daily as needed. 06/30/19   Laban Emperor, PA-C  lidocaine (LIDODERM) 5 % Place 1 patch onto the skin daily. Remove & Discard patch within 12 hours or as directed by MD 02/07/18   Delia Heady, PA-C  lisinopril (PRINIVIL,ZESTRIL) 10 MG tablet Take 1 tablet (10 mg total) by mouth daily. 11/21/13   Withrow, Elyse Jarvis, FNP  metoprolol succinate (TOPROL-XL) 100 MG 24 hr tablet Take 1 tablet (100 mg total) by mouth daily. Take with or immediately following a meal. 11/21/13    Withrow, Elyse Jarvis, FNP  moxifloxacin (AVELOX) 400 MG tablet Take 400 mg by mouth daily at 8 pm.    [provider]  omeprazole (PRILOSEC) 20 MG capsule Take 1 capsule (20 mg total) by mouth daily. 07/30/13   Niel Hummer, NP  oxyCODONE-acetaminophen (PERCOCET/ROXICET) 5-325 MG per tablet Take 1-2 tablets by mouth every 4 (four) hours as needed for severe pain. 01/12/15   Michel Bickers, MD  simvastatin (ZOCOR) 10 MG tablet Take 10 mg by mouth daily.    [provider]  traZODone (DESYREL) 150 MG tablet Take 1 tablet (150 mg total) by mouth at bedtime as needed for sleep. 11/21/13   Withrow, Elyse Jarvis, FNP    Allergies Bee venom  Family History  Problem Relation Age of Onset  . Hyperlipidemia Father     Social History Social History   Tobacco Use  . Smoking status: Former Smoker    Packs/day: 0.50    Years: 18.00    Pack years: 9.00    Types: Cigarettes  . Smokeless tobacco: Never Used  . Tobacco comment: cutting back  Substance Use Topics  . Alcohol use: Not Currently  . Drug use: Yes    Types: Marijuana, Oxycodone, "Crack" cocaine    Review of Systems Constitutional: No fever/chills Eyes: No visual changes. ENT: No sore throat. Cardiovascular: Denies chest pain. Respiratory: Denies shortness of breath. Gastrointestinal: No abdominal pain.  No nausea, no vomiting.  No diarrhea.  No constipation. Genitourinary: Negative for dysuria. Musculoskeletal: Negative for neck pain.  Negative for back pain. Integumentary: Negative for rash. Neurological: Negative for headaches, focal weakness or numbness. Psychiatric:  Positive for anxiety   ____________________________________________   PHYSICAL EXAM:  VITAL SIGNS: ED Triage Vitals  Enc Vitals Group     BP 07/03/19 2217 (!) 192/109     Pulse Rate 07/03/19 2217 100     Resp 07/03/19 2217 (!) 28     Temp 07/03/19 2217 98.6 F (37 C)     Temp Source 07/03/19 2217 Oral     SpO2 07/03/19 2217 98 %      Weight 07/03/19 2218 117.9 kg (260 lb)     Height 07/03/19 2218 1.676 m (5\' 6" )     Head Circumference --      Peak Flow --      Pain Score 07/03/19 2218 7     Pain Loc --      Pain Edu? --      Excl. in [place]? --     Constitutional: Alert and oriented.  Appears anxious Eyes: Conjunctivae are normal.  Mouth/Throat: Mucous membranes are moist. Neck: No stridor.  No meningeal signs.   Cardiovascular: Normal rate, regular rhythm. Good peripheral circulation. Grossly normal heart sounds. Respiratory: Normal respiratory effort.  No retractions. Gastrointestinal: Soft and nontender. No distention.  Musculoskeletal: No lower extremity tenderness nor edema. No gross deformities  of extremities. Neurologic:  Normal speech and language. No gross focal neurologic deficits are appreciated.  Skin:  Skin is warm, dry and intact. Psychiatric: Appears anxious. Speech and behavior are normal. ____________________________________________    Procedures   ____________________________________________   INITIAL IMPRESSION / MDM / ASSESSMENT AND PLAN / ED COURSE  As part of my medical decision making, I reviewed the following data within the electronic MEDICAL RECORD NUMBER   48 year old male presented with above-stated history and physical exam secondary to "panic attack".  Patient appears anxious during evaluation.  Patient given Ativan 1 mg p.o.  Patient states that he has an appointment with psychiatry next Tuesday.  ____________________________________________  FINAL CLINICAL IMPRESSION(S) / ED DIAGNOSES  Final diagnoses:  Panic attack     MEDICATIONS GIVEN DURING THIS VISIT:  Medications  LORazepam (ATIVAN) tablet 1 mg (1 mg Oral Given 07/04/19 0110)     ED Discharge Orders    None      *Please note:  Bryan Huffman was evaluated in Emergency Department on 07/04/2019 for the symptoms described in the history of present illness. He was evaluated in the context of the global COVID-19  pandemic, which necessitated consideration that the patient might be at risk for infection with the SARS-CoV-2 virus that causes COVID-19. Institutional protocols and algorithms that pertain to the evaluation of patients at risk for COVID-19 are in a state of rapid change based on information released by regulatory bodies including the CDC and federal and state organizations. These policies and algorithms were followed during the patient's care in the ED.  Some ED evaluations and interventions may be delayed as a result of limited staffing during the pandemic.*  Note:  This document was prepared using Dragon voice recognition software and may include unintentional dictation errors.   Darci CurrentBrown, [place] N, MD 07/04/19 [phone]

## 2019-07-04 NOTE — ED Notes (Signed)
Patient states he "feels a bit better"

## 2019-07-08 ENCOUNTER — Ambulatory Visit: Payer: Self-pay | Admitting: Orthopedic Surgery

## 2019-07-10 HISTORY — PX: TRANSTHORACIC ECHOCARDIOGRAM: SHX275

## 2019-07-21 ENCOUNTER — Other Ambulatory Visit: Payer: Self-pay

## 2019-07-21 ENCOUNTER — Emergency Department (HOSPITAL_COMMUNITY): Payer: Medicaid Other

## 2019-07-21 ENCOUNTER — Emergency Department (HOSPITAL_COMMUNITY)
Admission: EM | Admit: 2019-07-21 | Discharge: 2019-07-21 | Disposition: A | Discharge status: Home / Self Care | Payer: Medicaid Other | Attending: Emergency Medicine | Admitting: Emergency Medicine

## 2019-07-21 ENCOUNTER — Encounter (HOSPITAL_COMMUNITY): Payer: Self-pay | Admitting: Emergency Medicine

## 2019-07-21 DIAGNOSIS — I10 Essential (primary) hypertension: Secondary | ICD-10-CM | POA: Diagnosis present

## 2019-07-21 DIAGNOSIS — Z87891 Personal history of nicotine dependence: Secondary | ICD-10-CM | POA: Insufficient documentation

## 2019-07-21 DIAGNOSIS — Z79899 Other long term (current) drug therapy: Secondary | ICD-10-CM | POA: Diagnosis not present

## 2019-07-21 DIAGNOSIS — Z7982 Long term (current) use of aspirin: Secondary | ICD-10-CM | POA: Diagnosis not present

## 2019-07-21 LAB — CBC
HCT: 39.1 % (ref 39.0–52.0)
Hemoglobin: 12.7 g/dL — ABNORMAL LOW (ref 13.0–17.0)
MCH: 29.1 pg (ref 26.0–34.0)
MCHC: 32.5 g/dL (ref 30.0–36.0)
MCV: 89.5 fL (ref 80.0–100.0)
Platelets: 270 10*3/uL (ref 150–400)
RBC: 4.37 MIL/uL (ref 4.22–5.81)
RDW: 13.6 % (ref 11.5–15.5)
WBC: 8.3 10*3/uL (ref 4.0–10.5)
nRBC: 0 % (ref 0.0–0.2)

## 2019-07-21 LAB — BASIC METABOLIC PANEL
Anion gap: 12 (ref 5–15)
BUN: 18 mg/dL (ref 6–20)
CO2: 23 mmol/L (ref 22–32)
Calcium: 9.5 mg/dL (ref 8.9–10.3)
Chloride: 101 mmol/L (ref 98–111)
Creatinine, Ser: 1.01 mg/dL (ref 0.61–1.24)
GFR calc Af Amer: >60 mL/min (ref >60)
GFR calc non Af Amer: >60 mL/min (ref >60)
Glucose, Bld: 99 mg/dL (ref 70–99)
Potassium: 4.2 mmol/L (ref 3.5–5.1)
Sodium: 136 mmol/L (ref 135–145)

## 2019-07-21 LAB — TROPONIN I (HIGH SENSITIVITY)
Troponin I (High Sensitivity): <2 ng/L (ref <18)
Troponin I (High Sensitivity): <2 ng/L (ref <18)

## 2019-07-21 MED ORDER — LISINOPRIL 10 MG PO TABS
10.0000 mg | ORAL_TABLET | Freq: Once | ORAL | Status: AC
  Administered 2019-07-21: 10 mg via ORAL
  Filled 2019-07-21: qty 1

## 2019-07-21 MED ORDER — LISINOPRIL 10 MG PO TABS: 20.0000 mg | ORAL_TABLET | Freq: Every day | ORAL | 0 refills | Status: DC

## 2019-07-21 MED ORDER — SODIUM CHLORIDE 0.9% FLUSH: 3.0000 mL | Freq: Once | INTRAVENOUS | Status: DC

## 2019-07-21 NOTE — ED Triage Notes (Addendum)
Pt sent from orthopedic preop for hypertension. Pt also being treated for anxiety without relief; complaint of chest pressure and SOB; associated.

## 2019-07-21 NOTE — ED Notes (Signed)
PT UNDERSTANDS BED STATUS. PT HAS WALKED OUTSIDE STATING HE WILL BE GONE FOR 10 MINUTES.

## 2019-07-21 NOTE — ED Provider Notes (Signed)
[facility]-EMERGENCY DEPT Provider Note   CSN: [id number] Arrival date & time: 07/21/19  1212     History   Chief Complaint Chief Complaint  Patient presents with   Hypertension    HPI Bryan Huffman is a 48 y.o. male.     HPI Patient presented to the emergency room for evaluation of high blood pressure.  Patient has a history of degenerative disc disease.  Patient also has a history of anxiety and hypertension.  Patient states he is scheduled for surgery with Dr. Shon BatonBrooks.  He went for preop evaluation today and was noted to be hypertensive.  He was told to come to the emergency room.  Patient states he has had some issues with intermittent shortness of breath and chest discomfort.  He has noticed increasing symptoms as his pain in his neck increases.  He feels like his anxiety and his shortness of breath increased as his neck discomfort increases.  He denies any fevers or chills.  No leg swelling. 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   History of pancreatitis 09/02/2014   DDD (degenerative disc disease), lumbar 10/15/2013   Alcohol dependence (HCC) 07/26/2013   Back pain 07/26/2013   MDD (major depressive disorder) 07/26/2013    Past Surgical History:  Procedure Laterality Date   ANTERIOR LAT LUMBAR FUSION N/A 01/29/2014   Procedure: X LIF L2-L3 WITH LATERAL PLATES -ANTERIOR LATERAL LUMBAR FUSION 1 LEVEL;  Surgeon:  Venita Lickahari Brooks, MD;  Location: [facility] OR;  Service: Orthopedics;  Laterality: N/A;   BACK SURGERY  [id number]   L3 L4 L5 fusion   I&D EXTREMITY Right 09/07/2014   Procedure: DEBRIDEMENT RIGHT HAND EXTENSOR TENOSYNOVECTOMY;  Surgeon: Jodi Marbleavid A Thompson, MD;  Location: [facility];  Service: Orthopedics;  Laterality: Right;   I&D EXTREMITY Right 12/14/2014   Procedure: IRRIGATION AND DEBRIDEMENT EXTREMITY RIGHT HAND;  Surgeon: Mack Hookavid Thompson, MD;  Location: [facility];  Service: Orthopedics;  Laterality: Right;  RRIGATION AND DEBRIDEMENT EXTREMITY RIGHT HAND   LUMBAR LAMINECTOMY/DECOMPRESSION MICRODISCECTOMY Left 10/15/2013   Procedure: LUMBAR 2-3 LEFT DISCECTOMY;  Surgeon: Venita Lickahari Brooks, MD;  Location: [facility] OR;  Service: Orthopedics;  Laterality: Left;   LUMBAR SPINE SURGERY  10/15/2013   L 2 L3  DISECTOMY   SPINE SURGERY     TONSILLECTOMY          Home Medications    Prior to Admission medications   Medication Sig Start Date End Date Taking? Authorizing Provider  aspirin 81 MG tablet Take 81 mg by mouth daily.    [provider]  azithromycin (ZITHROMAX Z-PAK) 250 MG tablet Take 2 tablets (500 mg) on  Day 1,  followed by 1 tablet (250 mg) once daily on Days 2 through 5. 06/30/19   Enid DerryWagner, Ashley, PA-C  clarithromycin (BIAXIN) 500 MG tablet Take 500 mg by mouth 2 (two) times daily.  [provider]  cyclobenzaprine (FLEXERIL) 10 MG tablet Take 1 tablet (10 mg total) by mouth 2 (two) times daily as needed for muscle spasms. 02/07/18   Khatri, Hina, PA-C  hydrochlorothiazide (MICROZIDE) 12.5 MG capsule Take 1 capsule (12.5 mg total) by mouth daily. 11/21/13   Withrow, Everardo All, FNP  hydrOXYzine (ATARAX/VISTARIL) 25 MG tablet Take 1 tablet (25 mg total) by mouth 3 (three) times daily as needed. 06/30/19   Enid Derry, PA-C  lidocaine (LIDODERM) 5 % Place 1 patch onto the skin daily. Remove & Discard patch within 12 hours or as directed by MD 02/07/18    Dietrich Pates, PA-C  lisinopril (ZESTRIL) 10 MG tablet Take 2 tablets (20 mg total) by mouth daily. 07/21/19   Linwood Dibbles, MD  metoprolol succinate (TOPROL-XL) 100 MG 24 hr tablet Take 1 tablet (100 mg total) by mouth daily. Take with or immediately following a meal. 11/21/13   Withrow, Everardo All, FNP  moxifloxacin (AVELOX) 400 MG tablet Take 400 mg by mouth daily at 8 pm.    [provider]  omeprazole (PRILOSEC) 20 MG capsule Take 1 capsule (20 mg total) by mouth daily. 07/30/13   Thermon Leyland, NP  oxyCODONE-acetaminophen (PERCOCET/ROXICET) 5-325 MG per tablet Take 1-2 tablets by mouth every 4 (four) hours as needed for severe pain. 01/12/15   [name], MD  simvastatin (ZOCOR) 10 MG tablet Take 10 mg by mouth daily.    [provider]  traZODone (DESYREL) 150 MG tablet Take 1 tablet (150 mg total) by mouth at bedtime as needed for sleep. 11/21/13   Withrow, Everardo All, FNP    Family History Family History  Problem Relation Age of Onset   Hyperlipidemia Father     Social History Social History   Tobacco Use   Smoking status: Former Smoker    Packs/day: 0.50    Years: 18.00    Pack years: 9.00    Types: Cigarettes   Smokeless tobacco: Never Used   Tobacco comment: cutting back  Substance Use Topics   Alcohol use: Not Currently   Drug use: Yes    Types: Marijuana, Oxycodone, "Crack" cocaine     Allergies   Bee venom   Review of Systems Review of Systems  All other systems reviewed and are negative.    Physical Exam Updated Vital Signs BP (!) 164/97 (BP Location: Left Arm)    Pulse 81    Temp 97.8 F (36.6 C) (Oral)    Resp 20    Ht 1.676 m (5\' 6" )    Wt 120.7 kg    SpO2 100%    BMI 42.93 kg/m   Physical Exam Vitals signs and nursing note reviewed.  Constitutional:      General: He is not in acute distress.    Appearance: He is well-developed.  HENT:     Head: Normocephalic and atraumatic.     Right Ear: External ear normal.     Left Ear:  External ear normal.  Eyes:     General: No scleral icterus.       Right eye: No discharge.        Left eye: No discharge.     Conjunctiva/sclera: Conjunctivae normal.  Neck:     Musculoskeletal: Neck supple.     Trachea: No tracheal deviation.  Cardiovascular:     Rate and Rhythm: Normal rate and regular rhythm.  Pulmonary:     Effort: Pulmonary effort is normal. No respiratory distress.  Breath sounds: Normal breath sounds. No stridor. No wheezing or rales.  Abdominal:     General: Bowel sounds are normal. There is no distension.     Palpations: Abdomen is soft.     Tenderness: There is no abdominal tenderness. There is no guarding or rebound.  Musculoskeletal:        General: No tenderness.  Skin:    General: Skin is warm and dry.     Findings: No rash.  Neurological:     Mental Status: He is alert.     Cranial Nerves: No cranial nerve deficit (no facial droop, extraocular movements intact, no slurred speech).     Sensory: No sensory deficit.     Motor: No abnormal muscle tone or seizure activity.     Coordination: Coordination normal.      ED Treatments / Results  Labs (all labs ordered are listed, but only abnormal results are displayed) Labs Reviewed  CBC - Abnormal; Notable for the following components:      Result Value   Hemoglobin 12.7 (*)    All other components within normal limits  BASIC METABOLIC PANEL  TROPONIN I (HIGH SENSITIVITY)  TROPONIN I (HIGH SENSITIVITY)    EKG EKG Interpretation  Date/Time:  Monday July 21 2019 12:44:55 EDT Ventricular Rate:  81 PR Interval:    QRS Duration: 98 QT Interval:  386 QTC Calculation: 440 R Axis:   66 Text Interpretation:  Sinus rhythm Abnormal R-wave progression, early transition Baseline wander in lead(s) V1 V2 No significant change since last tracing Confirmed by Linwood Dibbles [phone]) on 07/21/2019 8:21:45 PM   Radiology Dg Chest 2 View  Result Date: 07/21/2019 CLINICAL DATA:  Preoperative study.  EXAM: CHEST - 2 VIEW COMPARISON:  June 30, 2019 FINDINGS: The heart size and mediastinal contours are within normal limits. Both lungs are clear. The visualized skeletal structures are unremarkable. IMPRESSION: No active cardiopulmonary disease. Electronically Signed   By: Gerome Sam III M.D   On: 07/21/2019 13:15    Procedures Procedures (including critical care time)  Medications Ordered in ED Medications  sodium chloride flush (NS) 0.9 % injection 3 mL (has no administration in time range)  lisinopril (ZESTRIL) tablet 10 mg (has no administration in time range)     Initial Impression / Assessment and Plan / ED Course  I have reviewed the triage vital signs and the nursing notes.  Pertinent labs & imaging results that were available during my care of the patient were reviewed by me and considered in my medical decision making (see chart for details).   Patient presented to ED for evaluation of hypertension associated with his preop evaluation.  Patient does have history of known hypertension but his blood pressure has been worsening.  Patient's laboratory tests are unremarkable.  Normal troponin.  EKG is reassuring.  Chest x-ray without acute abnormalities.  No signs of cardiac ischemia, congestive heart failure or other emergent etiology associated with his hypertension.  His elevated blood pressure certainly may be related to his increasing neck pain and his anxiety but I will increase his lisinopril to 20 mg daily.  He will continue his other medications and continue with his surgical plans.  Final Clinical Impressions(s) / ED Diagnoses   Final diagnoses:  Hypertension, unspecified type    ED Discharge Orders         Ordered    lisinopril (ZESTRIL) 10 MG tablet  Daily     07/21/19 2033  Dorie Rank, MD 07/21/19 2036

## 2019-07-21 NOTE — Discharge Instructions (Addendum)
Increase your zestril from 10 mg to 20 mg daily.  Continue your other medications.  Follow up with your surgery as planned

## 2019-07-25 ENCOUNTER — Ambulatory Visit (INDEPENDENT_AMBULATORY_CARE_PROVIDER_SITE_OTHER): Payer: Medicaid Other | Admitting: Cardiology

## 2019-07-25 ENCOUNTER — Encounter: Payer: Self-pay | Admitting: *Deleted

## 2019-07-25 ENCOUNTER — Encounter: Payer: Self-pay | Admitting: Cardiology

## 2019-07-25 ENCOUNTER — Other Ambulatory Visit: Payer: Self-pay

## 2019-07-25 VITALS — BP 118/80 | HR 68 | Ht 66.0 in | Wt 264.0 lb

## 2019-07-25 DIAGNOSIS — F17210 Nicotine dependence, cigarettes, uncomplicated: Secondary | ICD-10-CM

## 2019-07-25 DIAGNOSIS — I10 Essential (primary) hypertension: Secondary | ICD-10-CM | POA: Diagnosis not present

## 2019-07-25 DIAGNOSIS — R0602 Shortness of breath: Secondary | ICD-10-CM | POA: Diagnosis not present

## 2019-07-25 DIAGNOSIS — R079 Chest pain, unspecified: Secondary | ICD-10-CM | POA: Diagnosis not present

## 2019-07-25 DIAGNOSIS — Z131 Encounter for screening for diabetes mellitus: Secondary | ICD-10-CM

## 2019-07-25 NOTE — Patient Instructions (Signed)
Medication Instructions:  Your physician recommends that you continue on your current medications as directed. Please refer to the Current Medication list given to you today.  *If you need a refill on your cardiac medications before your next appointment, please call your pharmacy*  Lab Work: Your physician recommends that you return for lab work today: lipid panel, hemoglobin A1c.   If you have labs (blood work) drawn today and your tests are completely normal, you will receive your results only by: Marland Kitchen MyChart Message (if you have MyChart) OR . A paper copy in the mail If you have any lab test that is abnormal or we need to change your treatment, we will call you to review the results.  Testing/Procedures: You had an EKG today.   Your physician has requested that you have an echocardiogram. Echocardiography is a painless test that uses sound waves to create images of your heart. It provides your doctor with information about the size and shape of your heart and how well your heart's chambers and valves are working. This procedure takes approximately one hour. There are no restrictions for this procedure.  Your physician has requested that you have a lexiscan myoview. For further information please visit [email or web address]. Please follow instruction sheet, as given.  Follow-Up: At [facility], you and your health needs are our priority.  As part of our continuing mission to provide you with exceptional heart care, we have created designated Provider Care Teams.  These Care Teams include your primary Cardiologist (physician) and Advanced Practice Providers (APPs -  Physician Assistants and Nurse Practitioners) who all work together to provide you with the care you need, when you need it.  Your next appointment:   3 months  The format for your next appointment:   In Person  Provider:   Berniece Salines, DO     Echocardiogram An echocardiogram is a procedure that uses painless sound  waves (ultrasound) to produce an image of the heart. Images from an echocardiogram can provide important information about:  Signs of coronary artery disease (CAD).  Aneurysm detection. An aneurysm is a weak or damaged part of an artery wall that bulges out from the normal force of blood pumping through the body.  Heart size and shape. Changes in the size or shape of the heart can be associated with certain conditions, including heart failure, aneurysm, and CAD.  Heart muscle function.  Heart valve function.  Signs of a past heart attack.  Fluid buildup around the heart.  Thickening of the heart muscle.  A tumor or infectious growth around the heart valves. Tell a health care provider about:  Any allergies you have.  All medicines you are taking, including vitamins, herbs, eye drops, creams, and over-the-counter medicines.  Any blood disorders you have.  Any surgeries you have had.  Any medical conditions you have.  Whether you are pregnant or may be pregnant. What are the risks? Generally, this is a safe procedure. However, problems may occur, including:  Allergic reaction to dye (contrast) that may be used during the procedure. What happens before the procedure? No specific preparation is needed. You may eat and drink normally. What happens during the procedure?   An IV tube may be inserted into one of your veins.  You may receive contrast through this tube. A contrast is an injection that improves the quality of the pictures from your heart.  A gel will be applied to your chest.  A wand-like tool (transducer) will be moved over  your chest. The gel will help to transmit the sound waves from the transducer.  The sound waves will harmlessly bounce off of your heart to allow the heart images to be captured in real-time motion. The images will be recorded on a computer. The procedure may vary among health care providers and hospitals. What happens after the procedure?   You may return to your normal, everyday life, including diet, activities, and medicines, unless your health care provider tells you not to do that. Summary  An echocardiogram is a procedure that uses painless sound waves (ultrasound) to produce an image of the heart.  Images from an echocardiogram can provide important information about the size and shape of your heart, heart muscle function, heart valve function, and fluid buildup around your heart.  You do not need to do anything to prepare before this procedure. You may eat and drink normally.  After the echocardiogram is completed, you may return to your normal, everyday life, unless your health care provider tells you not to do that. This information is not intended to replace advice given to you by your health care provider. Make sure you discuss any questions you have with your health care provider. Document Released: 09/22/2000 Document Revised: 01/16/2019 Document Reviewed: 10/28/2016 Elsevier Patient Education  2020 [employer] Inc.    Cardiac Nuclear Scan A cardiac nuclear scan is a test that is done to check the flow of blood to your heart. It is done when you are resting and when you are exercising. The test looks for problems such as:  Not enough blood reaching a portion of the heart.  The heart muscle not working as it should. You may need this test if:  You have heart disease.  You have had lab results that are not normal.  You have had heart surgery or a balloon procedure to open up blocked arteries (angioplasty).  You have chest pain.  You have shortness of breath. In this test, a special dye (tracer) is put into your bloodstream. The tracer will travel to your heart. A camera will then take pictures of your heart to see how the tracer moves through your heart. This test is usually done at a hospital and takes 2-4 hours. Tell a doctor about:  Any allergies you have.  All medicines you are taking, including  vitamins, herbs, eye drops, creams, and over-the-counter medicines.  Any problems you or family members have had with anesthetic medicines.  Any blood disorders you have.  Any surgeries you have had.  Any medical conditions you have.  Whether you are pregnant or may be pregnant. What are the risks? Generally, this is a safe test. However, problems may occur, such as:  Serious chest pain and heart attack. This is only a risk if the stress portion of the test is done.  Rapid heartbeat.  A feeling of warmth in your chest. This feeling usually does not last long.  Allergic reaction to the tracer. What happens before the test?  Ask your doctor about changing or stopping your normal medicines. This is important.  Follow instructions from your doctor about what you cannot eat or drink.  Remove your jewelry on the day of the test. What happens during the test?  An IV tube will be inserted into one of your veins.  Your doctor will give you a small amount of tracer through the IV tube.  You will wait for 20-40 minutes while the tracer moves through your bloodstream.  Your heart will  be monitored with an electrocardiogram (ECG).  You will lie down on an exam table.  Pictures of your heart will be taken for about 15-20 minutes.  You may also have a stress test. For this test, one of these things may be done: ? You will be asked to exercise on a treadmill or a stationary bike. ? You will be given medicines that will make your heart work harder. This is done if you are unable to exercise.  When blood flow to your heart has peaked, a tracer will again be given through the IV tube.  After 20-40 minutes, you will get back on the exam table. More pictures will be taken of your heart.  Depending on the tracer that is used, more pictures may need to be taken 3-4 hours later.  Your IV tube will be removed when the test is over. The test may vary among doctors and hospitals. What  happens after the test?  Ask your doctor: ? Whether you can return to your normal schedule, including diet, activities, and medicines. ? Whether you should drink more fluids. This will help to remove the tracer from your body. Drink enough fluid to keep your pee (urine) pale yellow.  Ask your doctor, or the department that is doing the test: ? When will my results be ready? ? How will I get my results? Summary  A cardiac nuclear scan is a test that is done to check the flow of blood to your heart.  Tell your doctor whether you are pregnant or may be pregnant.  Before the test, ask your doctor about changing or stopping your normal medicines. This is important.  Ask your doctor whether you can return to your normal activities. You may be asked to drink more fluids. This information is not intended to replace advice given to you by your health care provider. Make sure you discuss any questions you have with your health care provider. Document Released: 03/11/2018 Document Revised: 01/15/2019 Document Reviewed: 03/11/2018 Elsevier Patient Education  2020 [employer]  .

## 2019-07-25 NOTE — Progress Notes (Signed)
Cardiology Office Note:    Date:  07/25/2019   ID:  Bryan Huffman, DOB 1971-05-07, MRN [id number]  PCP:  Dema Severin, NP  Cardiologist:  Thomasene Ripple, DO  Electrophysiologist:  None   Referring MD: [facility]*   Chief Complaint  Patient presents with  . Pre-op Exam    Neck surgery/C5-C6     History of Present Illness:    Bryan Huffman is a 48 y.o. male with a hx of hypertension, polysubstance abuse, obesity, cigarette smoker and alcohol abuse presents to be evaluated preoperatively.  Patient tells me that his CT 36 fusion surgery was canceled due to hypertension.  During our visit today he says he has been experiencing intermittent chest pressure.  He states that he feels like "someone sitting on my chest".  Noticed that this first-2008 after he lost his grandmother.  After that time he had not experienced this.  He noticed that the last several months he has been experiencing intermittent chest pressure.  The last for few minutes to resolution.  Nothing make it better or worse.  And he associated this with shortness of breath.  Denies any radiation of the pain.    Past Medical History:  Diagnosis Date  . Anxiety   . Arthritis   . Back pain   . Depression   . GERD (gastroesophageal reflux disease)   . Hemorrhoid   . Hypertension   . Insomnia   . Mental disorder   . Neuromuscular disorder (HCC)    left leg pain - 4 back surgeries    Past Surgical History:  Procedure Laterality Date  . ANTERIOR LAT LUMBAR FUSION N/A 01/29/2014   Procedure: X LIF L2-L3 WITH LATERAL PLATES -ANTERIOR LATERAL LUMBAR FUSION 1 LEVEL;  Surgeon: Venita Lick, MD;  Location: [facility] OR;  Service: Orthopedics;  Laterality: N/A;  . BACK SURGERY  1998,2008   L3 L4 L5 fusion  . I&D EXTREMITY Right 09/07/2014   Procedure: DEBRIDEMENT RIGHT HAND EXTENSOR TENOSYNOVECTOMY;  Surgeon: Jodi Marble, MD;  Location: [facility];  Service: Orthopedics;  Laterality: Right;  . I&D  EXTREMITY Right 12/14/2014   Procedure: IRRIGATION AND DEBRIDEMENT EXTREMITY RIGHT HAND;  Surgeon: Mack Hook, MD;  Location: [facility];  Service: Orthopedics;  Laterality: Right;  RRIGATION AND DEBRIDEMENT EXTREMITY RIGHT HAND  . LUMBAR LAMINECTOMY/DECOMPRESSION MICRODISCECTOMY Left 10/15/2013   Procedure: LUMBAR 2-3 LEFT DISCECTOMY;  Surgeon: Venita Lick, MD;  Location: [facility] OR;  Service: Orthopedics;  Laterality: Left;  . LUMBAR SPINE SURGERY  10/15/2013   L 2 L3  DISECTOMY  . SPINE SURGERY    . TONSILLECTOMY      Current Medications: Current Meds  Medication Sig  . dexlansoprazole (DEXILANT) 60 MG capsule Take 60 mg by mouth daily.  Marland Kitchen lisinopril (ZESTRIL) 10 MG tablet Take 2 tablets (20 mg total) by mouth daily. (Patient taking differently: Take 10 mg by mouth daily. )  . metoprolol succinate (TOPROL-XL) 100 MG 24 hr tablet Take 1 tablet (100 mg total) by mouth daily. Take with or immediately following a meal.  . simvastatin (ZOCOR) 10 MG tablet Take 10 mg by mouth daily.  . TRINTELLIX 10 MG TABS tablet Take 10 mg by mouth daily.     Allergies:   Bee venom   Social History   Socioeconomic History  . Marital status: Married    Spouse name: Not on file  . Number of children: Not on file  . Years of education: Not  on file  . Highest education level: Not on file  Occupational History  . Not on file  Social Needs  . Financial resource strain: Not on file  . Food insecurity    Worry: Not on file    Inability: Not on file  . Transportation needs    Medical: Not on file    Non-medical: Not on file  Tobacco Use  . Smoking status: Current Some Day Smoker    Packs/day: 0.50    Years: 18.00    Pack years: 9.00    Types: Cigarettes  . Smokeless tobacco: Never Used  . Tobacco comment: cutting back  Substance and Sexual Activity  . Alcohol use: Not Currently  . Drug use: Yes    Types: Marijuana, Oxycodone, "Crack" cocaine  . Sexual activity: Not on file   Lifestyle  . Physical activity    Days per week: Not on file    Minutes per session: Not on file  . Stress: Not on file  Relationships  . Social Herbalist on phone: Not on file    Gets together: Not on file    Attends religious service: Not on file    Active member of club or organization: Not on file    Attends meetings of clubs or organizations: Not on file    Relationship status: Not on file  Other Topics Concern  . Not on file  Social History Narrative  . Not on file     Family History: The patient's family history includes Diabetes in his father; Heart disease in his father; Hyperlipidemia in his father.  His father first MI was in his 35s.  Maternal grandfather with a MI\  ROS:   Review of Systems  Constitution: Negative for decreased appetite, fever and weight gain.  HENT: Negative for congestion, ear discharge, hoarse voice and sore throat.   Eyes: Negative for discharge, redness, vision loss in right eye and visual halos.  Cardiovascular: Reports chest pressure and dyspnes on exertion.  Negative for  leg swelling, orthopnea and palpitations.  Respiratory: Negative for cough, hemoptysis, shortness of breath and snoring.   Endocrine: Negative for heat intolerance and polyphagia.  Hematologic/Lymphatic: Negative for bleeding problem. Does not bruise/bleed easily.  Skin: Negative for flushing, nail changes, rash and suspicious lesions.  Musculoskeletal: Negative for arthritis, joint pain, muscle cramps, myalgias, neck pain and stiffness.  Gastrointestinal: Negative for abdominal pain, bowel incontinence, diarrhea and excessive appetite.  Genitourinary: Negative for decreased libido, genital sores and incomplete emptying.  Neurological: Negative for brief paralysis, focal weakness, headaches and loss of balance.  Psychiatric/Behavioral: Negative for altered mental status, depression and suicidal ideas.  Allergic/Immunologic: Negative for HIV exposure and  persistent infections.    EKGs/Labs/Other Studies Reviewed:    The following studies were reviewed today:   EKG:  The ekg ordered today demonstrates sinus rhythm, heart rate 68 bpm, similar to EKG performed on 07/21/2019.  Chest x-ray on July 21, 2019  IMPRESSION: No active cardiopulmonary disease.   Recent Labs: 06/30/2019: ALT 73; B Natriuretic Peptide 39.0 07/21/2019: BUN 18; Creatinine, Ser 1.01; Hemoglobin 12.7; Platelets 270; Potassium 4.2; Sodium 136  Recent Lipid Panel No results found for: CHOL, TRIG, HDL, CHOLHDL, VLDL, LDLCALC, LDLDIRECT  Physical Exam:    VS:  BP 118/80 (BP Location: Right Arm, Patient Position: Sitting, Cuff Size: Normal)   Pulse 68   Ht 5\' 6"  (1.676 m)   Wt 264 lb (119.7 kg)   SpO2 96%   BMI  42.61 kg/m     Wt Readings from Last 3 Encounters:  07/25/19 264 lb (119.7 kg)  07/21/19 266 lb (120.7 kg)  07/03/19 260 lb (117.9 kg)     GEN: Obese male ,well nourished, well developed in no acute distress HEENT: Normal NECK: No JVD; No carotid bruits LYMPHATICS: No lymphadenopathy CARDIAC: S1S2 noted,RRR, no murmurs, rubs, gallops RESPIRATORY:  Clear to auscultation without rales, wheezing or rhonchi  ABDOMEN: Soft, non-tender, non-distended, +bowel sounds, no guarding. EXTREMITIES: No edema, No cyanosis, no clubbing MUSCULOSKELETAL:  No edema; No deformity  SKIN: Warm and dry NEUROLOGIC:  Alert and oriented x 3, non-focal PSYCHIATRIC:  Normal affect, good insight  ASSESSMENT:    1. Essential hypertension   2. Chest pain, unspecified type   3. Cigarette smoker   4. Morbid obesity (HCC)   5. Diabetes mellitus screening    PLAN:    1.  The patient does have risk factor for coronary artery disease.  Given his symptoms at this time going to pursue ischemic evaluation.  Have educated patient on a pharmacologic nuclear stress test.  He is agreeable to pursue with testing.  2.  Transthoracic echocardiogram will be ordered to assess his  RV/LV function as well as any valvular abnormalities.  3.  His blood pressure susceptible in the office today.  He will continue on his lisinopril 10 mg daily, metoprolol succinate 10 mg daily, lisinopril 10 mg daily,  4  He will continue on his simvastatin.  We will get lipid profile and hemoglobin A1c screening.  5.  The patient was counseled on tobacco cessation today for 5 minutes.  Counseling included reviewing the risks of smoking tobacco products, how it impacts the patient's current medical diagnoses and different strategies for quitting.  Pharmacotherapy to aid in tobacco cessation was not prescribed today. The patient coordinate with  primary care provider.  The patient was also advised to call   1-800-QUIT-NOW [phone])570-4194) for additional help with quitting smoking.  6.  The patient understands the need to lose weight with diet and exercise. We have discussed specific strategies for this.  The patient is in agreement with the above plan. The patient left the office in stable condition.  The patient will follow up in 3 months.  Medication Adjustments/Labs and Tests Ordered: Current medicines are reviewed at length with the patient today.  Concerns regarding medicines are outlined above.  Orders Placed This Encounter  Procedures  . Lipid Profile  . HgB A1c  . MYOCARDIAL PERFUSION IMAGING  . ECHOCARDIOGRAM COMPLETE   No orders of the defined types were placed in this encounter.   Patient Instructions  Medication Instructions:  Your physician recommends that you continue on your current medications as directed. Please refer to the Current Medication list given to you today.  *If you need a refill on your cardiac medications before your next appointment, please call your pharmacy*  Lab Work: Your physician recommends that you return for lab work today: lipid panel, hemoglobin A1c.   If you have labs (blood work) drawn today and your tests are completely normal, you will  receive your results only by: Marland Kitchen MyChart Message (if you have MyChart) OR . A paper copy in the mail If you have any lab test that is abnormal or we need to change your treatment, we will call you to review the results.  Testing/Procedures: You had an EKG today.   Your physician has requested that you have an echocardiogram. Echocardiography is a painless test that  uses sound waves to create images of your heart. It provides your doctor with information about the size and shape of your heart and how well your heart's chambers and valves are working. This procedure takes approximately one hour. There are no restrictions for this procedure.  Your physician has requested that you have a lexiscan myoview. For further information please visit [email or web address] Please follow instruction sheet, as given.  Follow-Up: At [facility], you and your health needs are our priority.  As part of our continuing mission to provide you with exceptional heart care, we have created designated Provider Care Teams.  These Care Teams include your primary Cardiologist (physician) and Advanced Practice Providers (APPs -  Physician Assistants and Nurse Practitioners) who all work together to provide you with the care you need, when you need it.  Your next appointment:   3 months  The format for your next appointment:   In Person  Provider:   Thomasene Ripple, DO     Echocardiogram An echocardiogram is a procedure that uses painless sound waves (ultrasound) to produce an image of the heart. Images from an echocardiogram can provide important information about:  Signs of coronary artery disease (CAD).  Aneurysm detection. An aneurysm is a weak or damaged part of an artery wall that bulges out from the normal force of blood pumping through the body.  Heart size and shape. Changes in the size or shape of the heart can be associated with certain conditions, including heart failure, aneurysm, and CAD.  Heart  muscle function.  Heart valve function.  Signs of a past heart attack.  Fluid buildup around the heart.  Thickening of the heart muscle.  A tumor or infectious growth around the heart valves. Tell a health care provider about:  Any allergies you have.  All medicines you are taking, including vitamins, herbs, eye drops, creams, and over-the-counter medicines.  Any blood disorders you have.  Any surgeries you have had.  Any medical conditions you have.  Whether you are pregnant or may be pregnant. What are the risks? Generally, this is a safe procedure. However, problems may occur, including:  Allergic reaction to dye (contrast) that may be used during the procedure. What happens before the procedure? No specific preparation is needed. You may eat and drink normally. What happens during the procedure?   An IV tube may be inserted into one of your veins.  You may receive contrast through this tube. A contrast is an injection that improves the quality of the pictures from your heart.  A gel will be applied to your chest.  A wand-like tool (transducer) will be moved over your chest. The gel will help to transmit the sound waves from the transducer.  The sound waves will harmlessly bounce off of your heart to allow the heart images to be captured in real-time motion. The images will be recorded on a computer. The procedure may vary among health care providers and hospitals. What happens after the procedure?  You may return to your normal, everyday life, including diet, activities, and medicines, unless your health care provider tells you not to do that. Summary  An echocardiogram is a procedure that uses painless sound waves (ultrasound) to produce an image of the heart.  Images from an echocardiogram can provide important information about the size and shape of your heart, heart muscle function, heart valve function, and fluid buildup around your heart.  You do not need  to do anything to prepare before this procedure.  You may eat and drink normally.  After the echocardiogram is completed, you may return to your normal, everyday life, unless your health care provider tells you not to do that. This information is not intended to replace advice given to you by your health care provider. Make sure you discuss any questions you have with your health care provider. Document Released: 09/22/2000 Document Revised: 01/16/2019 Document Reviewed: 10/28/2016 Elsevier Patient Education  2020 Elsevier Inc.    Cardiac Nuclear Scan A cardiac nuclear scan is a test that is done to check the flow of blood to your heart. It is done when you are resting and when you are exercising. The test looks for problems such as:  Not enough blood reaching a portion of the heart.  The heart muscle not working as it should. You may need this test if:  You have heart disease.  You have had lab results that are not normal.  You have had heart surgery or a balloon procedure to open up blocked arteries (angioplasty).  You have chest pain.  You have shortness of breath. In this test, a special dye (tracer) is put into your bloodstream. The tracer will travel to your heart. A camera will then take pictures of your heart to see how the tracer moves through your heart. This test is usually done at a hospital and takes 2-4 hours. Tell a doctor about:  Any allergies you have.  All medicines you are taking, including vitamins, herbs, eye drops, creams, and over-the-counter medicines.  Any problems you or family members have had with anesthetic medicines.  Any blood disorders you have.  Any surgeries you have had.  Any medical conditions you have.  Whether you are pregnant or may be pregnant. What are the risks? Generally, this is a safe test. However, problems may occur, such as:  Serious chest pain and heart attack. This is only a risk if the stress portion of the test is done.   Rapid heartbeat.  A feeling of warmth in your chest. This feeling usually does not last long.  Allergic reaction to the tracer. What happens before the test?  Ask your doctor about changing or stopping your normal medicines. This is important.  Follow instructions from your doctor about what you cannot eat or drink.  Remove your jewelry on the day of the test. What happens during the test?  An IV tube will be inserted into one of your veins.  Your doctor will give you a small amount of tracer through the IV tube.  You will wait for 20-40 minutes while the tracer moves through your bloodstream.  Your heart will be monitored with an electrocardiogram (ECG).  You will lie down on an exam table.  Pictures of your heart will be taken for about 15-20 minutes.  You may also have a stress test. For this test, one of these things may be done: ? You will be asked to exercise on a treadmill or a stationary bike. ? You will be given medicines that will make your heart work harder. This is done if you are unable to exercise.  When blood flow to your heart has peaked, a tracer will again be given through the IV tube.  After 20-40 minutes, you will get back on the exam table. More pictures will be taken of your heart.  Depending on the tracer that is used, more pictures may need to be taken 3-4 hours later.  Your IV tube will be removed when the  test is over. The test may vary among doctors and hospitals. What happens after the test?  Ask your doctor: ? Whether you can return to your normal schedule, including diet, activities, and medicines. ? Whether you should drink more fluids. This will help to remove the tracer from your body. Drink enough fluid to keep your pee (urine) pale yellow.  Ask your doctor, or the department that is doing the test: ? When will my results be ready? ? How will I get my results? Summary  A cardiac nuclear scan is a test that is done to check the flow  of blood to your heart.  Tell your doctor whether you are pregnant or may be pregnant.  Before the test, ask your doctor about changing or stopping your normal medicines. This is important.  Ask your doctor whether you can return to your normal activities. You may be asked to drink more fluids. This information is not intended to replace advice given to you by your health care provider. Make sure you discuss any questions you have with your health care provider. Document Released: 03/11/2018 Document Revised: 01/15/2019 Document Reviewed: 03/11/2018 Elsevier Patient Education  2020 [employer]  .    Adopting a Healthy Lifestyle.  Know what a healthy weight is for you (roughly BMI <25) and aim to maintain this   Aim for 7+ servings of fruits and vegetables daily   65-80+ fluid ounces of water or unsweet tea for healthy kidneys   Limit to max 1 drink of alcohol per day; avoid smoking/tobacco   Limit animal fats in diet for cholesterol and heart health - choose grass fed whenever available   Avoid highly processed foods, and foods high in saturated/trans fats   Aim for low stress - take time to unwind and care for your mental health   Aim for 150 min of moderate intensity exercise weekly for heart health, and weights twice weekly for bone health   Aim for 7-9 hours of sleep daily   When it comes to diets, agreement about the perfect plan isnt easy to find, even among the experts. Experts at the [employer] developed an idea known as the Healthy Eating Plate. Just imagine a plate divided into logical, healthy portions.   The emphasis is on diet quality:   Load up on vegetables and fruits - one-half of your plate: Aim for color and variety, and remember that potatoes dont count.   Go for whole grains - one-quarter of your plate: Whole wheat, barley, wheat berries, quinoa, oats, brown rice, and foods made with them. If you want pasta, go with whole wheat  pasta.   Protein power - one-quarter of your plate: Fish, chicken, beans, and nuts are all healthy, versatile protein sources. Limit red meat.   The diet, however, does go beyond the plate, offering a few other suggestions.   Use healthy plant oils, such as olive, canola, soy, corn, sunflower and peanut. Check the labels, and avoid partially hydrogenated oil, which have unhealthy trans fats.   If youre thirsty, drink water. Coffee and tea are good in moderation, but skip sugary drinks and limit milk and dairy products to one or two daily servings.   The type of carbohydrate in the diet is more important than the amount. Some sources of carbohydrates, such as vegetables, fruits, whole grains, and beans-are healthier than others.   Finally, stay active  Signed, Thomasene RippleKardie Cem Kosman, DO  07/25/2019 5:51 PM    [facility]

## 2019-07-26 LAB — LIPID PANEL
Chol/HDL Ratio: 4.1 ratio (ref 0.0–5.0)
Cholesterol, Total: 172 mg/dL (ref 100–199)
HDL: 42 mg/dL (ref >39)
LDL Chol Calc (NIH): 76 mg/dL (ref 0–99)
Triglycerides: 335 mg/dL — ABNORMAL HIGH (ref 0–149)
VLDL Cholesterol Cal: 54 mg/dL — ABNORMAL HIGH (ref 5–40)

## 2019-07-26 LAB — HEMOGLOBIN A1C
Est. average glucose Bld gHb Est-mCnc: 140 mg/dL
Hgb A1c MFr Bld: 6.5 % — ABNORMAL HIGH (ref 4.8–5.6)

## 2019-07-28 ENCOUNTER — Other Ambulatory Visit (HOSPITAL_COMMUNITY): Payer: Medicaid Other

## 2019-07-28 ENCOUNTER — Inpatient Hospital Stay (HOSPITAL_COMMUNITY): Admission: RE | Admit: 2019-07-28 | Payer: Medicaid Other | Source: Ambulatory Visit

## 2019-07-28 NOTE — Addendum Note (Signed)
Addended by: Particia Nearing B on: 07/28/2019 09:20 AM   Modules accepted: Orders

## 2019-07-30 ENCOUNTER — Other Ambulatory Visit: Payer: Self-pay

## 2019-07-30 ENCOUNTER — Ambulatory Visit (HOSPITAL_COMMUNITY): Admission: RE | Admit: 2019-07-30 | Payer: Medicaid Other | Source: Home / Self Care | Admitting: Orthopedic Surgery

## 2019-07-30 ENCOUNTER — Emergency Department (HOSPITAL_COMMUNITY): Payer: Medicaid Other

## 2019-07-30 ENCOUNTER — Telehealth (HOSPITAL_COMMUNITY): Payer: Self-pay

## 2019-07-30 ENCOUNTER — Encounter (HOSPITAL_COMMUNITY): Admission: RE | Payer: Self-pay | Source: Home / Self Care

## 2019-07-30 ENCOUNTER — Emergency Department (HOSPITAL_COMMUNITY)
Admission: EM | Admit: 2019-07-30 | Discharge: 2019-07-31 | Disposition: A | Discharge status: Home / Self Care | Payer: Medicaid Other | Attending: Emergency Medicine | Admitting: Emergency Medicine

## 2019-07-30 DIAGNOSIS — R0789 Other chest pain: Secondary | ICD-10-CM | POA: Insufficient documentation

## 2019-07-30 DIAGNOSIS — Z79899 Other long term (current) drug therapy: Secondary | ICD-10-CM | POA: Insufficient documentation

## 2019-07-30 DIAGNOSIS — R110 Nausea: Secondary | ICD-10-CM | POA: Diagnosis not present

## 2019-07-30 DIAGNOSIS — R079 Chest pain, unspecified: Secondary | ICD-10-CM

## 2019-07-30 DIAGNOSIS — I10 Essential (primary) hypertension: Secondary | ICD-10-CM | POA: Insufficient documentation

## 2019-07-30 DIAGNOSIS — F17210 Nicotine dependence, cigarettes, uncomplicated: Secondary | ICD-10-CM | POA: Insufficient documentation

## 2019-07-30 LAB — CBC WITH DIFFERENTIAL/PLATELET
Abs Immature Granulocytes: 0.02 10*3/uL (ref 0.00–0.07)
Basophils Absolute: 0 10*3/uL (ref 0.0–0.1)
Basophils Relative: 1 %
Eosinophils Absolute: 0.1 10*3/uL (ref 0.0–0.5)
Eosinophils Relative: 1 %
HCT: 41.3 % (ref 39.0–52.0)
Hemoglobin: 13.9 g/dL (ref 13.0–17.0)
Immature Granulocytes: 0 %
Lymphocytes Relative: 31 %
Lymphs Abs: 1.9 10*3/uL (ref 0.7–4.0)
MCH: 29.4 pg (ref 26.0–34.0)
MCHC: 33.7 g/dL (ref 30.0–36.0)
MCV: 87.3 fL (ref 80.0–100.0)
Monocytes Absolute: 0.4 10*3/uL (ref 0.1–1.0)
Monocytes Relative: 7 %
Neutro Abs: 3.6 10*3/uL (ref 1.7–7.7)
Neutrophils Relative %: 60 %
Platelets: 273 10*3/uL (ref 150–400)
RBC: 4.73 MIL/uL (ref 4.22–5.81)
RDW: 13.6 % (ref 11.5–15.5)
WBC: 6.1 10*3/uL (ref 4.0–10.5)
nRBC: 0 % (ref 0.0–0.2)

## 2019-07-30 LAB — COMPREHENSIVE METABOLIC PANEL
ALT: 153 U/L — ABNORMAL HIGH (ref 0–44)
AST: 210 U/L — ABNORMAL HIGH (ref 15–41)
Albumin: 3.9 g/dL (ref 3.5–5.0)
Alkaline Phosphatase: 63 U/L (ref 38–126)
Anion gap: 12 (ref 5–15)
BUN: 16 mg/dL (ref 6–20)
CO2: 21 mmol/L — ABNORMAL LOW (ref 22–32)
Calcium: 8.9 mg/dL (ref 8.9–10.3)
Chloride: 103 mmol/L (ref 98–111)
Creatinine, Ser: 0.88 mg/dL (ref 0.61–1.24)
GFR calc Af Amer: >60 mL/min (ref >60)
GFR calc non Af Amer: >60 mL/min (ref >60)
Glucose, Bld: 163 mg/dL — ABNORMAL HIGH (ref 70–99)
Potassium: 5.4 mmol/L — ABNORMAL HIGH (ref 3.5–5.1)
Sodium: 136 mmol/L (ref 135–145)
Total Bilirubin: 1.4 mg/dL — ABNORMAL HIGH (ref 0.3–1.2)
Total Protein: 6.4 g/dL — ABNORMAL LOW (ref 6.5–8.1)

## 2019-07-30 LAB — TROPONIN I (HIGH SENSITIVITY): Troponin I (High Sensitivity): 4 ng/L (ref <18)

## 2019-07-30 SURGERY — ANTERIOR CERVICAL DECOMPRESSION/DISCECTOMY FUSION 1 LEVEL
Anesthesia: General

## 2019-07-30 MED ORDER — ONDANSETRON HCL 4 MG/2ML IJ SOLN
4.0000 mg | Freq: Once | INTRAMUSCULAR | Status: AC
  Administered 2019-07-30: 4 mg via INTRAVENOUS
  Filled 2019-07-30: qty 2

## 2019-07-30 MED ORDER — NITROGLYCERIN 0.4 MG SL SUBL
0.4000 mg | SUBLINGUAL_TABLET | SUBLINGUAL | Status: DC | PRN
  Administered 2019-07-30: 0.4 mg via SUBLINGUAL
  Filled 2019-07-30 (×2): qty 1

## 2019-07-30 MED ORDER — CYCLOBENZAPRINE HCL 10 MG PO TABS
10.0000 mg | ORAL_TABLET | Freq: Once | ORAL | Status: AC
  Administered 2019-07-30: 10 mg via ORAL
  Filled 2019-07-30: qty 1

## 2019-07-30 MED ORDER — LORAZEPAM 2 MG/ML IJ SOLN
1.0000 mg | Freq: Once | INTRAMUSCULAR | Status: AC
  Administered 2019-07-30: 1 mg via INTRAVENOUS
  Filled 2019-07-30: qty 1

## 2019-07-30 NOTE — ED Triage Notes (Addendum)
Pt began having chest pain around 1600 today. Pt is scheduled for a stress test 07/31/19. Endorces anxiety and depression. Used goody powder earlier today. Pain is non radiating. Pt endorses  Drinking a "few" tall boys   55mcg fentynal en route

## 2019-07-30 NOTE — ED Provider Notes (Signed)
[facility] EMERGENCY DEPARTMENT Provider Note   CSN: [id number] Arrival date & time: 07/30/19  1733     History   Chief Complaint Chief Complaint  Patient presents with  . Chest Pain    HPI Bryan Huffman is a 48 y.o. male.     HPI   48yo male with history of htn, prediabetes,  CP began a few hours ago, central, feels like someone sitting on chest, feels like hard to breath but no new dyspnea.  A lot of pressure, 5/10, no alleviating or aggrevating factors, no radiation of pain  Assoc nausea Anxiety, has had other ED visits with these concerns   No recent surgeries or long trips Smoking again, drank a few beers today, does not every day, no other drugs  No family hx of early CAD Stress test scheduled for tom   Past Medical History:  Diagnosis Date  . Anxiety   . Arthritis   . Back pain   . Depression   . GERD (gastroesophageal reflux disease)   . Hemorrhoid   . Hypertension   . Insomnia   . Mental disorder   . Neuromuscular disorder (HCC)    left leg pain - 4 back surgeries    Patient Active Problem List   Diagnosis Date Noted  . Polysubstance abuse (HCC) 09/02/2014  . History of suicidal ideation 09/02/2014  . HTN (hypertension) 09/02/2014  . GERD (gastroesophageal reflux disease) 09/02/2014  . Cigarette smoker 09/02/2014  . 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 History of pancreatitis 09/02/2014  . DDD (degenerative disc disease), lumbar 10/15/2013  . Alcohol dependence (HCC) 07/26/2013  . Back pain 07/26/2013  . MDD (major depressive disorder) 07/26/2013    Past Surgical History:  Procedure Laterality Date  . ANTERIOR LAT LUMBAR FUSION N/A 01/29/2014   Procedure: X LIF L2-L3 WITH LATERAL PLATES -ANTERIOR LATERAL LUMBAR FUSION 1 LEVEL;  Surgeon: Venita Lick, MD;  Location: [facility];  Service: Orthopedics;  Laterality: N/A;  . BACK SURGERY  1998,2008   L3 L4 L5 fusion  . I&D EXTREMITY Right 09/07/2014   Procedure:  DEBRIDEMENT RIGHT HAND EXTENSOR TENOSYNOVECTOMY;  Surgeon: Jodi Marble, MD;  Location: [facility];  Service: Orthopedics;  Laterality: Right;  . I&D EXTREMITY Right 12/14/2014   Procedure: IRRIGATION AND DEBRIDEMENT EXTREMITY RIGHT HAND;  Surgeon: Mack Hook, MD;  Location: [facility];  Service: Orthopedics;  Laterality: Right;  RRIGATION AND DEBRIDEMENT EXTREMITY RIGHT HAND  . LUMBAR LAMINECTOMY/DECOMPRESSION MICRODISCECTOMY Left 10/15/2013   Procedure: LUMBAR 2-3 LEFT DISCECTOMY;  Surgeon: Venita Lick, MD;  Location: [facility] OR;  Service: Orthopedics;  Laterality: Left;  . LUMBAR SPINE SURGERY  10/15/2013   L 2 L3  DISECTOMY  . SPINE SURGERY    . TONSILLECTOMY          Home Medications    Prior to Admission medications   Medication Sig Start Date End Date Taking? Authorizing Provider  Aspirin-Acetaminophen-Caffeine (GOODY HEADACHE PO) Take 1 packet by mouth as needed (for pain).   Yes [provider]  dexlansoprazole (DEXILANT) 60 MG capsule Take 60 mg by mouth at bedtime.    Yes [provider]  lisinopril (ZESTRIL) 10 MG tablet Take 2 tablets (20 mg total) by mouth daily. Patient taking differently: Take 10 mg by mouth at bedtime.  07/21/19  Yes Linwood Dibbles, MD  metoprolol succinate (TOPROL-XL) 100 MG 24 hr tablet Take 1 tablet (100 mg total) by mouth daily. Take with or immediately  following a meal. 11/21/13  Yes Withrow, Everardo AllJohn C, FNP  simvastatin (ZOCOR) 10 MG tablet Take 10 mg by mouth at bedtime.    Yes [provider]  TRINTELLIX 10 MG TABS tablet Take 10 mg by mouth at bedtime.  07/08/19  Yes [provider]    Family History Family History  Problem Relation Age of Onset  . Hyperlipidemia Father   . Heart disease Father   . Diabetes Father     Social History Social History   Tobacco Use  . Smoking status: Current Some Day Smoker    Packs/day: 0.50    Years: 18.00    Pack years: 9.00    Types:  Cigarettes  . Smokeless tobacco: Never Used  . Tobacco comment: cutting back  Substance Use Topics  . Alcohol use: Not Currently  . Drug use: Yes    Types: Marijuana, Oxycodone, "Crack" cocaine     Allergies   Bee venom   Review of Systems Review of Systems  Constitutional: Positive for fatigue. Negative for fever.  HENT: Negative for sore throat.   Eyes: Negative for visual disturbance.  Respiratory: Negative for cough and shortness of breath.   Cardiovascular: Positive for chest pain.  Gastrointestinal: Positive for nausea. Negative for abdominal pain and vomiting.  Genitourinary: Negative for difficulty urinating.  Musculoskeletal: Negative for back pain and neck stiffness.  Skin: Negative for rash.  Neurological: Negative for syncope and headaches.     Physical Exam Updated Vital Signs BP 140/84   Pulse 70   Temp 97.9 F (36.6 C) (Oral)   Resp 18   Ht 5\' 6"  (1.676 m)   Wt 117.9 kg   SpO2 95%   BMI 41.97 kg/m   Physical Exam Vitals signs and nursing note reviewed.  Constitutional:      General: He is not in acute distress.    Appearance: He is well-developed. He is not diaphoretic.  HENT:     Head: Normocephalic and atraumatic.  Eyes:     Conjunctiva/sclera: Conjunctivae normal.  Neck:     Musculoskeletal: Normal range of motion.  Cardiovascular:     Rate and Rhythm: Normal rate and regular rhythm.     Heart sounds: Normal heart sounds. No murmur. No friction rub. No gallop.   Pulmonary:     Effort: Pulmonary effort is normal. No respiratory distress.     Breath sounds: Normal breath sounds. No wheezing or rales.  Abdominal:     General: There is no distension.     Palpations: Abdomen is soft.     Tenderness: There is no abdominal tenderness. There is no guarding.  Skin:    General: Skin is warm and dry.  Neurological:     Mental Status: He is alert and oriented to person, place, and time.      ED Treatments / Results  Labs (all labs ordered  are listed, but only abnormal results are displayed) Labs Reviewed  COMPREHENSIVE METABOLIC PANEL - Abnormal; Notable for the following components:      Result Value   Potassium 5.4 (*)    CO2 21 (*)    Glucose, Bld 163 (*)    Total Protein 6.4 (*)    AST 210 (*)    ALT 153 (*)    Total Bilirubin 1.4 (*)    All other components within normal limits  CBC WITH DIFFERENTIAL/PLATELET  CBC WITH DIFFERENTIAL/PLATELET  TROPONIN I (HIGH SENSITIVITY)  TROPONIN I (HIGH SENSITIVITY)  TROPONIN I (HIGH SENSITIVITY)  EKG EKG Interpretation  Date/Time:  Wednesday July 30 2019 17:43:35 EDT Ventricular Rate:  70 PR Interval:    QRS Duration: 97 QT Interval:  405 QTC Calculation: 437 R Axis:   31 Text Interpretation:  Sinus rhythm Confirmed by Ripley Fraise [phone]) on 07/31/2019 12:04:32 AM   Radiology Dg Chest Portable 1 View  Result Date: 07/30/2019 CLINICAL DATA:  Central chest heaviness EXAM: PORTABLE CHEST 1 VIEW COMPARISON:  07/21/2019 FINDINGS: The heart size and mediastinal contours are within normal limits. Both lungs are clear. The visualized skeletal structures are unremarkable. IMPRESSION: No active disease. Electronically Signed   By: Donavan Foil M.D.   On: 07/30/2019 19:00    Procedures Procedures (including critical care time)  Medications Ordered in ED Medications  nitroGLYCERIN (NITROSTAT) SL tablet 0.4 mg (0.4 mg Sublingual Given 07/30/19 1905)  hydrOXYzine (ATARAX/VISTARIL) tablet 50 mg (50 mg Oral Refused 07/31/19 0011)  ondansetron (ZOFRAN) injection 4 mg (4 mg Intravenous Given 07/30/19 1905)  cyclobenzaprine (FLEXERIL) tablet 10 mg (10 mg Oral Given 07/30/19 1905)  LORazepam (ATIVAN) injection 1 mg (1 mg Intravenous Given 07/30/19 2033)  ketorolac (TORADOL) 30 MG/ML injection 30 mg (30 mg Intravenous Given 07/31/19 0045)  ondansetron (ZOFRAN-ODT) disintegrating tablet 4 mg (4 mg Oral Given 07/31/19 0052)     Initial Impression / Assessment and Plan  / ED Course  I have reviewed the triage vital signs and the nursing notes.  Pertinent labs & imaging results that were available during my care of the patient were reviewed by me and considered in my medical decision making (see chart for details).        48 year old male with a history of hypertension, prediabetes, smoking, elevated BMI, presents with concern for chest pain.  EKG shows no acute findings, no findings consistent with STEMI or pericarditis.  Chest x-ray shows no evidence of pneumonia, pneumothorax, widened mediastinum or other abnormalities.  Hello suspicion for pulmonary embolus by history and physical exam, no asymmetric leg swelling, no significant dyspnea, no tachycardia, no hypoxia.  Repeat EKG and delta troponins without signs of acute ischemia.  Does note associated anxiety.  Patient has a stress test scheduled for tomorrow with cardiology. Recommend Cardiology follow up, return for new or worsening symptoms.   Final Clinical Impressions(s) / ED Diagnoses   Final diagnoses:  Chest pain, unspecified type    ED Discharge Orders    None       Gareth Morgan, MD 07/31/19 0202

## 2019-07-30 NOTE — Telephone Encounter (Signed)
Spoke with the patient, gave him his instructions and he said that he would be here for his test tomorrow. Asked to call back with any questions. S.Skai Lickteig EMTP

## 2019-07-31 ENCOUNTER — Ambulatory Visit (HOSPITAL_COMMUNITY): Payer: Medicaid Other

## 2019-07-31 ENCOUNTER — Ambulatory Visit (HOSPITAL_BASED_OUTPATIENT_CLINIC_OR_DEPARTMENT_OTHER): Payer: Medicaid Other

## 2019-07-31 DIAGNOSIS — Z131 Encounter for screening for diabetes mellitus: Secondary | ICD-10-CM

## 2019-07-31 DIAGNOSIS — F17210 Nicotine dependence, cigarettes, uncomplicated: Secondary | ICD-10-CM | POA: Diagnosis not present

## 2019-07-31 DIAGNOSIS — R079 Chest pain, unspecified: Secondary | ICD-10-CM | POA: Diagnosis not present

## 2019-07-31 DIAGNOSIS — I10 Essential (primary) hypertension: Secondary | ICD-10-CM

## 2019-07-31 LAB — TROPONIN I (HIGH SENSITIVITY): Troponin I (High Sensitivity): 4 ng/L (ref <18)

## 2019-07-31 LAB — ECHOCARDIOGRAM COMPLETE

## 2019-07-31 MED ORDER — PERFLUTREN LIPID MICROSPHERE: 1.0000 mL | INTRAVENOUS | Status: DC | PRN

## 2019-07-31 MED ORDER — ONDANSETRON 4 MG PO TBDP
4.0000 mg | ORAL_TABLET | Freq: Once | ORAL | Status: AC
  Administered 2019-07-31: 4 mg via ORAL
  Filled 2019-07-31: qty 1

## 2019-07-31 MED ORDER — KETOROLAC TROMETHAMINE 30 MG/ML IJ SOLN
30.0000 mg | Freq: Once | INTRAMUSCULAR | Status: AC
  Administered 2019-07-31: 30 mg via INTRAVENOUS
  Filled 2019-07-31: qty 1

## 2019-07-31 MED ORDER — HYDROXYZINE HCL 25 MG PO TABS
50.0000 mg | ORAL_TABLET | Freq: Once | ORAL | Status: DC
  Filled 2019-07-31: qty 2

## 2019-07-31 NOTE — ED Notes (Signed)
Patient verbalizes understanding of discharge instructions. Opportunity for questioning and answers were provided. Armband removed by staff, pt discharged from ED.  

## 2019-08-01 ENCOUNTER — Telehealth: Payer: Self-pay | Admitting: *Deleted

## 2019-08-01 NOTE — Telephone Encounter (Signed)
Telephone call to patient. Left message to return call. 

## 2019-08-01 NOTE — Telephone Encounter (Signed)
-----   Message from Berniece Salines, DO sent at 07/31/2019 11:50 PM EDT ----- Please let patient know that his echo some findings that is a natural course. There is calcium noted on his mitral valve and the aortic valve. No stenosis or regurgitation. Overall good. I will explain at his next visit.

## 2019-08-04 ENCOUNTER — Other Ambulatory Visit: Payer: Self-pay

## 2019-08-04 ENCOUNTER — Ambulatory Visit (HOSPITAL_COMMUNITY): Payer: Medicaid Other | Attending: Cardiovascular Disease

## 2019-08-04 ENCOUNTER — Telehealth: Payer: Self-pay | Admitting: Cardiology

## 2019-08-04 VITALS — Ht 66.0 in | Wt 264.0 lb

## 2019-08-04 DIAGNOSIS — R0602 Shortness of breath: Secondary | ICD-10-CM | POA: Diagnosis present

## 2019-08-04 DIAGNOSIS — R079 Chest pain, unspecified: Secondary | ICD-10-CM | POA: Diagnosis not present

## 2019-08-04 LAB — MYOCARDIAL PERFUSION IMAGING
LV dias vol: 71 mL (ref 62–150)
LV sys vol: 24 mL
Peak HR: 112 {beats}/min
Rest BP: 17090 mmHg
Rest HR: 83 {beats}/min
SDS: 0
SRS: 0
SSS: 0
TID: 0.95

## 2019-08-04 MED ORDER — TECHNETIUM TC 99M TETROFOSMIN IV KIT
10.2000 | PACK | Freq: Once | INTRAVENOUS | Status: AC | PRN
  Administered 2019-08-04: 10.2 via INTRAVENOUS
  Filled 2019-08-04: qty 11

## 2019-08-04 MED ORDER — TECHNETIUM TC 99M TETROFOSMIN IV KIT
31.9000 | PACK | Freq: Once | INTRAVENOUS | Status: AC | PRN
  Administered 2019-08-04: 31.9 via INTRAVENOUS
  Filled 2019-08-04: qty 32

## 2019-08-04 MED ORDER — REGADENOSON 0.4 MG/5ML IV SOLN
0.4000 mg | Freq: Once | INTRAVENOUS | Status: AC
  Administered 2019-08-04: 0.4 mg via INTRAVENOUS

## 2019-08-04 NOTE — Telephone Encounter (Signed)
Has questions about having calcium around his heart valves

## 2019-08-04 NOTE — Telephone Encounter (Signed)
Telephone call to patient. Pt concerned about the calcium around his valves and whether he needed to be on medicine for it. Explained that Dr Harriet Masson is monitoring the calcium for now and no need for medication at this time. Pt relieved and had no further questions.

## 2019-08-05 ENCOUNTER — Encounter: Payer: Self-pay | Admitting: *Deleted

## 2019-08-11 ENCOUNTER — Telehealth: Payer: Self-pay

## 2019-08-11 NOTE — Telephone Encounter (Signed)
Unable to reach the patient, spoke with his father, will call back in 30 min

## 2019-08-11 NOTE — Telephone Encounter (Signed)
   Primary Cardiologist: Berniece Salines, DO  Chart reviewed as part of pre-operative protocol coverage. Patient was contacted 08/11/2019 in reference to pre-operative risk assessment for pending surgery as outlined below.  Chelsey A Connolly was last seen on 07/25/2019 by Dr. Harriet Masson.  Since that day, Algenis A Odea has done well. Blood pressure better controlled. Recent echocardiogram and stress test were normal.   Therefore, based on ACC/AHA guidelines, the patient would be at acceptable risk for the planned procedure without further cardiovascular testing.   I will route this recommendation to the requesting party via Epic fax function and remove from pre-op pool.  Please call with questions.  [place], [place] 08/11/2019, 4:53 PM

## 2019-08-11 NOTE — Telephone Encounter (Signed)
   [facility] Pre-operative Risk Assessment    Request for surgical clearance:  1. What type of surgery is being performed? ACDF C5-6   2. When is this surgery scheduled? TBD   3. What type of clearance is required (medical clearance vs. Pharmacy clearance to hold med vs. Both)? Medical  4. Are there any medications that need to be held prior to surgery and how long? No   5. Practice name and name of physician performing surgery? [facility]/Dr. Melina Schools   6. What is your office phone number [phone]    7.   What is your office fax number [phone]: Derl Barrow  8.   Anesthesia type (None, local, MAC, general) ? General   Mady Haagensen 08/11/2019, 1:17 PM  _________________________________________________________________   (provider comments below)

## 2019-08-27 ENCOUNTER — Ambulatory Visit: Payer: Self-pay | Admitting: Orthopedic Surgery

## 2019-09-09 ENCOUNTER — Ambulatory Visit: Payer: Self-pay | Admitting: Orthopedic Surgery

## 2019-09-09 NOTE — H&P (Signed)
Subjective:   Bryan Huffman is a pleasant 48 year old male who has a past medical history significant for hypertension, anxiety disorder, Sleep apnea (uses CPAP), and morbid obesity (BMI 42.6) who is here today for a preoperative history and physical. His chief complaint is neck pain and radicular right arm pain Since prior to 5/12 5 2020. Pain has persisted despite injection therapy and over-the-counter medications. At this point, the pain is severe and debilitating and the patient would like to move forward with surgical intervention he is scheduled for C5-6 ACDF at [facility] on 09/24/19.  Patient Active Problem List   Diagnosis Date Noted  . Polysubstance abuse ([place]) 09/02/2014  . History of suicidal ideation 09/02/2014  . HTN (hypertension) 09/02/2014  . GERD (gastroesophageal reflux disease) 09/02/2014  . Cigarette smoker 09/02/2014  . Osteomyelitis of hand ([place]) 09/02/2014  . History of pancreatitis 09/02/2014  . DDD (degenerative disc disease), lumbar 10/15/2013  . Alcohol dependence ([place]) 07/26/2013  . Back pain 07/26/2013  . MDD (major depressive disorder) 07/26/2013   Past Medical History:  Diagnosis Date  . Anxiety   . Arthritis   . Back pain   . Depression   . GERD (gastroesophageal reflux disease)   . Hemorrhoid   . Hypertension   . Insomnia   . Mental disorder   . Neuromuscular disorder ([place])    left leg pain - 4 back surgeries    Past Surgical History:  Procedure Laterality Date  . ANTERIOR LAT LUMBAR FUSION N/A 01/29/2014   Procedure: X LIF L2-L3 WITH LATERAL PLATES -ANTERIOR LATERAL LUMBAR FUSION 1 LEVEL;  Surgeon: Melina Schools, MD;  Location: [place];  Service: Orthopedics;  Laterality: N/A;  . BACK SURGERY  1998,2008   L3 L4 L5 fusion  . I&D EXTREMITY Right 09/07/2014   Procedure: DEBRIDEMENT RIGHT HAND EXTENSOR TENOSYNOVECTOMY;  Surgeon: Jolyn Nap, MD;  Location: [place];  Service: Orthopedics;  Laterality: Right;  . I&D EXTREMITY Right 12/14/2014   Procedure: IRRIGATION AND DEBRIDEMENT EXTREMITY RIGHT HAND;  Surgeon: Milly Jakob, MD;  Location: [place];  Service: Orthopedics;  Laterality: Right;  RRIGATION AND DEBRIDEMENT EXTREMITY RIGHT HAND  . LUMBAR LAMINECTOMY/DECOMPRESSION MICRODISCECTOMY Left 10/15/2013   Procedure: LUMBAR 2-3 LEFT DISCECTOMY;  Surgeon: Melina Schools, MD;  Location: [place];  Service: Orthopedics;  Laterality: Left;  . LUMBAR SPINE SURGERY  10/15/2013   L 2 L3  DISECTOMY  . SPINE SURGERY    . TONSILLECTOMY      Current Outpatient Medications  Medication Sig Dispense Refill Last Dose  . Aspirin-Acetaminophen-Caffeine (GOODY HEADACHE PO) Take 1 packet by mouth as needed (for pain).   07/30/2019 at 1400  . dexlansoprazole (DEXILANT) 60 MG capsule Take 60 mg by mouth at bedtime.    07/29/2019 at Unknown time  . lisinopril (ZESTRIL) 10 MG tablet Take 2 tablets (20 mg total) by mouth daily. (Patient taking differently: Take 10 mg by mouth at bedtime. ) 60 tablet 0 07/29/2019 at pm  . metoprolol succinate (TOPROL-XL) 100 MG 24 hr tablet Take 1 tablet (100 mg total) by mouth daily. Take with or immediately following a meal. 30 tablet 0 07/29/2019 at 2100  . simvastatin (ZOCOR) 10 MG tablet Take 10 mg by mouth at bedtime.    07/29/2019 at pm  . TRINTELLIX 10 MG TABS tablet Take 10 mg by mouth at bedtime.    07/29/2019 at pm   No current facility-administered medications for this visit.    Allergies  Allergen Reactions  .  Bee Venom Anaphylaxis and Swelling    Social History   Tobacco Use  . Smoking status: Current Some Day Smoker    Packs/day: 0.50    Years: 18.00    Pack years: 9.00    Types: Cigarettes  . Smokeless tobacco: Never Used  . Tobacco comment: cutting back  Substance Use Topics  . Alcohol use: Not Currently    Family History  Problem Relation Age of Onset  . Hyperlipidemia Father   . Heart disease Father   . Diabetes Father     Review of Systems As stated in HPI  Objective:    Vitals: Ht: 5 ft 5 in 09/09/2019 10:41 am BP: 156/94 sitting L arm 09/09/2019 10:45 am Pulse: 90 bpm 09/09/2019 10:45 am  General: AAOX3, well developed and well nourished, Patient seem short of breath Ambulation: normal gait pattern, uses no assistive device. Inspection: No obvious deformity Heart: RRR Lungs: CTAB Abdomen: BSx4, non-tender, non-distended.  AROM: - Fwd Flexion: Normal and pain free - Extension: Normal and pain free - Lateral bending to left: Normal and pain free - Lateral Bending to right: Normal and pain free - Rotation to Left: Normal and pain free - Rotation to Right: Normal and pain free -Shoulder, elbow, and wrists AROM normal and pain free.  Dermatomes: Positive dysesthesias to light touch right thumb.  Myotomes: - shoulder shrug: Left 5/5, Right 5/5 -Shoulder Abduction: Left 5/5, Right 5/5 - Elbow flexion: Left 5/5, Right 5/5 - Elbow extension Left 5/5, Right 5/5 - Wrist flexion left 5/5, right 5/5 - Finger abduction: Left 5/5, Right 5/5 - finger Adduction/squeeze: Left 5/5, Right 5/5  Reflexes: - Biceps: Left2+, Right 2+ - Brachioradialius: Left2+, Right 2+ - Triceps: Left 2+, Right 2+ - Hoffman's: Negative - Babinski: Left Ngative, Right Negative - Clonus: Negative  Special Tests: - UE Neural tension test: Left Negative, Right Negative - Heel to toe walking Positive  PV: Extremities warm and well profused. 2+ UE pulses bilaterally.  Cervical MRI: completed on 02/28/19 was reviewed with the patient. I have also reviewed the radiology report. No cord signal changes. Minimal retrolisthesis of C5 on C6. Increased kyphotic angulation at this level. Mild central stenosis with moderate to severe neural foraminal stenosis. Mild degenerative changes of the remainder of the levels.  Assessment:   Bryan Huffman is a pleasant 48 year old male who has a past medical history significant for hypertension, anxiety disorder, Sleep apnea (uses CPAP), and morbid  obesity (BMI 42.6) who is here today for a preoperative history and physical. His chief complaint is neck pain and radicular right arm pain Since prior to 5/12 5 2020. Patient has 5/5 UE motor strength with positive dysethesias in the right upper extremity. MRI shows Minimal retrolisthesis of C5 on C6. Increased kyphotic angulation at this level. Mild central stenosis with moderate to severe neural foraminal stenosis. Pain has persisted despite injection therapy and over-the-counter medications. At this point, the pain is severe and debilitating and the patient would like to move forward with surgical intervention. he is scheduled for C5-6 ACDF at [facility] on 09/24/19.  Plan:   We did discuss in detail the risks and benefits of a C5-6 ACDF. Risks and benefits of surgery were discussed with the patient. These include: Infection, bleeding, death, stroke, paralysis, ongoing or worse pain, need for additional surgery, nonunion, leak of spinal fluid, adjacent segment degeneration requiring additional fusion surgery. Pseudoarthrosis (nonunion)requiring supplemental posterior fixation. Throat pain, swallowing difficulties, hoarseness or change in voice.  With respect to disc  replacement: Additional risks include heterotopic ossification, inability to place the disc due to technical issues requiring bailout to a fusion procedure.  We have also discussed the goals of surgery to include: Goals of surgery: Reduction in pain, and improvement in quality of life.  I have counseled the patient on nicotine cessation. I have discussed with the patient that nicotine products of any kind increase the rate of degenerative disc disease of the spine. Furthermore, I have discussed with the patient that if surgical intervention is warranted, nicotine use increases the surgical risks including but not limited to increase of postoperative infection, increase risk of blood clots, decreased rate of fusion. I would recommend that the  patient follow up with their primary care provider for options regarding nicotine cessation.  We have also discussed the post-operative recovery period to include: bathing/showering restrictions, wound healing, activity (and driving) restrictions, medications/pain mangement. Patient will get postoperative pain medications from pain management provider.  We have also discussed post-operative redflags to include: signs and symptoms of postoperative infection, DVT/PE.  We have obtained preoperative medical clearance from the patient's primary care provider as well as a cardiologist.  Patient will get his Aspen collar at the hospital.  Plan is to move forward with surgery as described [facility].  Follow-up: 2 weeks postoperatively

## 2019-09-18 ENCOUNTER — Encounter (HOSPITAL_COMMUNITY): Payer: Self-pay | Admitting: Orthopedic Surgery

## 2019-09-18 NOTE — Anesthesia Preprocedure Evaluation (Addendum)
Anesthesia Evaluation  Patient identified by MRN, date of birth, ID band Patient awake    Reviewed: Allergy & Precautions, NPO status , Patient's Chart, lab work & pertinent test results, reviewed documented beta blocker date and time   Airway Mallampati: III  TM Distance: >3 FB Neck ROM: Full  Mouth opening: Limited Mouth Opening  Dental no notable dental hx. (+) Teeth Intact, Dental Advisory Given   Pulmonary sleep apnea and Continuous Positive Airway Pressure Ventilation , Current Smoker and Patient abstained from smoking.,    Pulmonary exam normal breath sounds clear to auscultation       Cardiovascular hypertension, Pt. on home beta blockers and Pt. on medications negative cardio ROS Normal cardiovascular exam Rhythm:Regular Rate:Normal  TTE 07/2019 Normal LVEF, no significant valvular abnormalities  Stress Test 07/2019 Nuclear stress EF: 66%. The left ventricular ejection fraction is hyperdynamic (>65%). There was no ST segment deviation noted during stress. This is a low risk study. There is no evidence of ischemia or previous infarction The study is normal.   Neuro/Psych PSYCHIATRIC DISORDERS Anxiety Depression negative neurological ROS     GI/Hepatic GERD  ,(+)     substance abuse (surgery cancelled on 10/21 for UDS positive for cocaine)  alcohol use, cocaine use and marijuana use,   Endo/Other  diabetes, Oral Hypoglycemic AgentsMorbid obesity  Renal/GU negative Renal ROS  negative genitourinary   Musculoskeletal  (+) Arthritis ,   Abdominal   Peds  Hematology negative hematology ROS (+)   Anesthesia Other Findings   Reproductive/Obstetrics                         Anesthesia Physical Anesthesia Plan  ASA: III  Anesthesia Plan: General   Post-op Pain Management:    Induction: Intravenous  PONV Risk Score and Plan: 1 and Midazolam, Dexamethasone and Ondansetron  Airway  Management Planned: Oral ETT  Additional Equipment:   Intra-op Plan:   Post-operative Plan: Extubation in OR  Informed Consent: I have reviewed the patients History and Physical, chart, labs and discussed the procedure including the risks, benefits and alternatives for the proposed anesthesia with the patient or authorized representative who has indicated his/her understanding and acceptance.     Dental advisory given  Plan Discussed with: CRNA  Anesthesia Plan Comments:       Anesthesia Quick Evaluation

## 2019-09-18 NOTE — Progress Notes (Addendum)
Anesthesia Chart Review: Recently referred to cardiology for preop eval. Cardiac clearance per telephone encounter 08/11/19, "Casen A Wogan was last seen on 07/25/2019 by Dr. Harriet Masson.  Since that day, Jamorris A Stratton has done well. Blood pressure better controlled. Recent echocardiogram and stress test were normal. Therefore, based on ACC/AHA guidelines, the patient would be at acceptable risk for the planned procedure without further cardiovascular testing."  Morbid obesity, BMI 42.6 per surgeon H&P.  OSA on CPAP per notes.  Preop labs reviewed, WNL.  EKG 07/30/19: Sinus rhythm rate 70.  Nuclear stress 08/04/19:  Nuclear stress EF: 66%. The left ventricular ejection fraction is hyperdynamic (>65%).  There was no ST segment deviation noted during stress.  This is a low risk study. There is no evidence of ischemia or previous infarction  The study is normal.   TTE 07/31/19:   1. Left ventricular ejection fraction, by visual estimation, is 55 to 60%. The left ventricle has normal function. Normal left ventricular size. There is no left ventricular hypertrophy.  2. Left ventricular diastolic Doppler parameters are consistent with impaired relaxation pattern of LV diastolic filling.  3. Global right ventricle has normal systolic function.The right ventricular size is normal. No increase in right ventricular wall thickness.  4. Left atrial size was normal.  5. Right atrial size was normal.  6. The pericardial effusion is circumferential.  7. Trivial pericardial effusion is present.  8. Mild to moderate aortic valve annular calcification.  9. The mitral valve is normal in structure. No evidence of mitral valve regurgitation. No evidence of mitral stenosis. 10. The tricuspid valve is normal in structure. Tricuspid valve regurgitation is trivial. 11. The aortic valve is normal in structure. Aortic valve regurgitation is trivial by color flow Doppler. Mild to moderate aortic valve  sclerosis/calcification without any evidence of aortic stenosis. 12. There is Mild calcification of the aortic valve. 13. There is Moderate thickening of the aortic valve. 14. The pulmonic valve was normal in structure. Pulmonic valve regurgitation is not visualized by color flow Doppler. 15. The inferior vena cava is normal in size with greater than 50% respiratory variability, suggesting right atrial pressure of 3 mmHg.  Wynonia Musty [facility] Short Stay Center/Anesthesiology Phone [phone] 09/18/2019 3:25 PM

## 2019-09-19 NOTE — Pre-Procedure Instructions (Signed)
Kaidence A Louderback  09/19/2019      [facility] - [place], [place] - [address] [address] Phone: [phone] Fax: [phone]  [id number] - [place], [place] [address] Phone: [phone] Fax: [phone]    Your procedure is scheduled on Dec.16  Report to [facility] entrance A at 1200 P.M. (noon)  Call this number if you have problems the morning of surgery:  [phone]   Remember:  Do not eat or drink after midnight.      Take these medicines the morning of surgery with A SIP OF WATER :              Eye drops             Baclofen (lioresal)             dexlansoprazole (dexilant)             Metoprolol (toprolol-xl)             Oxycodone if needed             Nasal spray if needed             pregabalin (lyrica)             Simvastatin (zocor)         STOP PHENTERMINE   7 days prior to surgery STOP taking any Aspirin (unless otherwise instructed by your surgeon), Aleve, Naproxen, Ibuprofen, Motrin, Advil, Goody's, BC's, all herbal medications, fish oil, and all vitamins.                       How to Manage Your Diabetes Before and After Surgery  Why is it important to control my blood sugar before and after surgery? . Improving blood sugar levels before and after surgery helps healing and can limit problems. . A way of improving blood sugar control is eating a healthy diet by: o  Eating less sugar and carbohydrates o  Increasing activity/exercise o  Talking with your doctor about reaching your blood sugar goals . High blood sugars (greater than 180 mg/dL) can raise your risk of infections and slow your recovery, so you will need to focus on controlling your diabetes during the weeks before surgery. . Make sure that the doctor who takes care of your diabetes knows about your planned surgery including the date and  location.  How do I manage my blood sugar before surgery? . Check your blood sugar at least 4 times a day, starting 2 days before surgery, to make sure that the level is not too high or low. o Check your blood sugar the morning of your surgery when you wake up and every 2 hours until you get to the Short Stay unit. . If your blood sugar is less than 70 mg/dL, you will need to treat for low blood sugar: o Do not take insulin. o Treat a low blood sugar (less than 70 mg/dL) with  cup of clear juice (cranberry or apple), 4 glucose tablets, OR glucose gel. Recheck blood sugar in 15 minutes after treatment (to make sure it is greater than 70 mg/dL). If your blood sugar is not greater than 70 mg/dL on recheck, call [phone] o  for further instructions. . Report your blood sugar to the short stay nurse when you get to Short Stay.  Marland Kitchen  If you are admitted to the hospital after surgery: o Your blood sugar will be checked by the staff and you will probably be given insulin after surgery (instead of oral diabetes medicines) to make sure you have good blood sugar levels. o The goal for blood sugar control after surgery is 80-180 mg/dL.       WHAT DO I DO ABOUT MY DIABETES MEDICATION?   Marland Kitchen Do not take oral diabetes medicines (pills) the morning of surgery.    Do not wear jewelry.  Do not wear lotions, powders, or perfumes, or deodorant.  Do not shave 48 hours prior to surgery.  Men may shave face and neck.  Do not bring valuables to the hospital.  [facility] is not responsible for any belongings or valuables.  Contacts, dentures or bridgework may not be worn into surgery.  Leave your suitcase in the car.  After surgery it may be brought to your room.  For patients admitted to the hospital, discharge time will be determined by your treatment team.  Patients discharged the day of surgery will not be allowed to drive home.    Special instructions:  [place]- Preparing For  Surgery  Before surgery, you can play an important role. Because skin is not sterile, your skin needs to be as free of germs as possible. You can reduce the number of germs on your skin by washing with CHG (chlorahexidine gluconate) Soap before surgery.  CHG is an antiseptic cleaner which kills germs and bonds with the skin to continue killing germs even after washing.    Oral Hygiene is also important to reduce your risk of infection.  Remember - BRUSH YOUR TEETH THE MORNING OF SURGERY WITH YOUR REGULAR TOOTHPASTE  Please do not use if you have an allergy to CHG or antibacterial soaps. If your skin becomes reddened/irritated stop using the CHG.  Do not shave (including legs and underarms) for at least 48 hours prior to first CHG shower. It is OK to shave your face.  Please follow these instructions carefully.   1. Shower the NIGHT BEFORE SURGERY and the MORNING OF SURGERY with CHG.   2. If you chose to wash your hair, wash your hair first as usual with your normal shampoo.  3. After you shampoo, rinse your hair and body thoroughly to remove the shampoo.  4. Use CHG as you would any other liquid soap. You can apply CHG directly to the skin and wash gently with a scrungie or a clean washcloth.   5. Apply the CHG Soap to your body ONLY FROM THE NECK DOWN.  Do not use on open wounds or open sores. Avoid contact with your eyes, ears, mouth and genitals (private parts). Wash Face and genitals (private parts)  with your normal soap.  6. Wash thoroughly, paying special attention to the area where your surgery will be performed.  7. Thoroughly rinse your body with warm water from the neck down.  8. DO NOT shower/wash with your normal soap after using and rinsing off the CHG Soap.  9. Pat yourself dry with a CLEAN TOWEL.  10. Wear CLEAN PAJAMAS to bed the night before surgery, wear comfortable clothes the morning of surgery  11. Place CLEAN SHEETS on your bed the night of your first shower and  DO NOT SLEEP WITH PETS.    Day of Surgery:  Do not apply any deodorants/lotions.  Please wear clean clothes to the hospital/surgery center.   Remember to brush your teeth  WITH YOUR REGULAR TOOTHPASTE.    Please read over the following fact sheets that you were given. Coughing and Deep Breathing and Surgical Site Infection Prevention

## 2019-09-22 ENCOUNTER — Other Ambulatory Visit: Payer: Self-pay

## 2019-09-22 ENCOUNTER — Encounter (HOSPITAL_COMMUNITY): Payer: Self-pay

## 2019-09-22 ENCOUNTER — Encounter (HOSPITAL_COMMUNITY)
Admission: RE | Admit: 2019-09-22 | Discharge: 2019-09-22 | Disposition: A | Discharge status: Home / Self Care | Payer: Medicaid Other | Source: Ambulatory Visit | Attending: Orthopedic Surgery | Admitting: Orthopedic Surgery

## 2019-09-22 ENCOUNTER — Other Ambulatory Visit (HOSPITAL_COMMUNITY)
Admission: RE | Admit: 2019-09-22 | Discharge: 2019-09-22 | Disposition: A | Discharge status: Home / Self Care | Payer: Medicaid Other | Source: Ambulatory Visit | Attending: Orthopedic Surgery | Admitting: Orthopedic Surgery

## 2019-09-22 DIAGNOSIS — M50322 Other cervical disc degeneration at C5-C6 level: Secondary | ICD-10-CM | POA: Diagnosis not present

## 2019-09-22 DIAGNOSIS — M4802 Spinal stenosis, cervical region: Secondary | ICD-10-CM | POA: Diagnosis not present

## 2019-09-22 DIAGNOSIS — M5412 Radiculopathy, cervical region: Secondary | ICD-10-CM | POA: Insufficient documentation

## 2019-09-22 DIAGNOSIS — Z01812 Encounter for preprocedural laboratory examination: Secondary | ICD-10-CM | POA: Diagnosis present

## 2019-09-22 LAB — COMPREHENSIVE METABOLIC PANEL
ALT: 39 U/L (ref 0–44)
AST: 32 U/L (ref 15–41)
Albumin: 4.2 g/dL (ref 3.5–5.0)
Alkaline Phosphatase: 62 U/L (ref 38–126)
Anion gap: 11 (ref 5–15)
BUN: 18 mg/dL (ref 6–20)
CO2: 24 mmol/L (ref 22–32)
Calcium: 9.5 mg/dL (ref 8.9–10.3)
Chloride: 100 mmol/L (ref 98–111)
Creatinine, Ser: 0.89 mg/dL (ref 0.61–1.24)
GFR calc Af Amer: >60 mL/min (ref >60)
GFR calc non Af Amer: >60 mL/min (ref >60)
Glucose, Bld: 104 mg/dL — ABNORMAL HIGH (ref 70–99)
Potassium: 4.2 mmol/L (ref 3.5–5.1)
Sodium: 135 mmol/L (ref 135–145)
Total Bilirubin: 0.5 mg/dL (ref 0.3–1.2)
Total Protein: 7.1 g/dL (ref 6.5–8.1)

## 2019-09-22 LAB — PROTIME-INR
INR: 1 (ref 0.8–1.2)
Prothrombin Time: 13.5 seconds (ref 11.4–15.2)

## 2019-09-22 LAB — CBC
HCT: 40.2 % (ref 39.0–52.0)
Hemoglobin: 12.9 g/dL — ABNORMAL LOW (ref 13.0–17.0)
MCH: 29.1 pg (ref 26.0–34.0)
MCHC: 32.1 g/dL (ref 30.0–36.0)
MCV: 90.7 fL (ref 80.0–100.0)
Platelets: 278 10*3/uL (ref 150–400)
RBC: 4.43 MIL/uL (ref 4.22–5.81)
RDW: 14.6 % (ref 11.5–15.5)
WBC: 6.4 10*3/uL (ref 4.0–10.5)
nRBC: 0 % (ref 0.0–0.2)

## 2019-09-22 LAB — URINALYSIS, ROUTINE W REFLEX MICROSCOPIC
Bilirubin Urine: NEGATIVE
Glucose, UA: NEGATIVE mg/dL
Hgb urine dipstick: NEGATIVE
Ketones, ur: NEGATIVE mg/dL
Leukocytes,Ua: NEGATIVE
Nitrite: NEGATIVE
Protein, ur: NEGATIVE mg/dL
Specific Gravity, Urine: 1.019 (ref 1.005–1.030)
pH: 6 (ref 5.0–8.0)

## 2019-09-22 LAB — SURGICAL PCR SCREEN
MRSA, PCR: NEGATIVE
Staphylococcus aureus: POSITIVE — AB

## 2019-09-22 LAB — SARS CORONAVIRUS 2 (TAT 6-24 HRS): SARS Coronavirus 2: NEGATIVE

## 2019-09-22 LAB — APTT: aPTT: 26 seconds (ref 24–36)

## 2019-09-22 NOTE — Progress Notes (Signed)
PCP:  Fredrich Romans, PA Cardiologist:  Berniece Salines - [place]  EKG:  08/07/19 CXR:  07/21/19 ECHO:  07/31/19 Stress Test:  08/04/19 Cardiac Cath:  Denies  Patient states he takes Metformin for weight loss.  Denies DM.  Covid test 09/22/19  Patient denies shortness of breath, fever, cough, and chest pain at PAT appointment.  Patient verbalized understanding of instructions provided today at the PAT appointment.  Patient asked to review instructions at home and day of surgery.

## 2019-09-23 MED ORDER — DEXTROSE 5 % IV SOLN
3.0000 g | INTRAVENOUS | Status: AC
  Administered 2019-09-24: 3 g via INTRAVENOUS
  Filled 2019-09-23: qty 3

## 2019-09-24 ENCOUNTER — Other Ambulatory Visit: Payer: Self-pay

## 2019-09-24 ENCOUNTER — Encounter (HOSPITAL_COMMUNITY): Payer: Self-pay | Admitting: Orthopedic Surgery

## 2019-09-24 ENCOUNTER — Ambulatory Visit (HOSPITAL_COMMUNITY): Payer: Medicaid Other

## 2019-09-24 ENCOUNTER — Ambulatory Visit (HOSPITAL_COMMUNITY): Payer: Medicaid Other | Admitting: Physician Assistant

## 2019-09-24 ENCOUNTER — Encounter (HOSPITAL_COMMUNITY)
Admission: RE | Disposition: A | Discharge status: Home / Self Care | Payer: Self-pay | Source: Home / Self Care | Attending: Orthopedic Surgery

## 2019-09-24 ENCOUNTER — Ambulatory Visit (HOSPITAL_COMMUNITY)
Admission: RE | Admit: 2019-09-24 | Discharge: 2019-09-25 | Disposition: A | Discharge status: Home / Self Care | Payer: Medicaid Other | Attending: Orthopedic Surgery | Admitting: Orthopedic Surgery

## 2019-09-24 DIAGNOSIS — Z419 Encounter for procedure for purposes other than remedying health state, unspecified: Secondary | ICD-10-CM

## 2019-09-24 DIAGNOSIS — M4802 Spinal stenosis, cervical region: Secondary | ICD-10-CM | POA: Insufficient documentation

## 2019-09-24 DIAGNOSIS — I10 Essential (primary) hypertension: Secondary | ICD-10-CM | POA: Diagnosis not present

## 2019-09-24 DIAGNOSIS — J449 Chronic obstructive pulmonary disease, unspecified: Secondary | ICD-10-CM | POA: Insufficient documentation

## 2019-09-24 DIAGNOSIS — Z79899 Other long term (current) drug therapy: Secondary | ICD-10-CM | POA: Insufficient documentation

## 2019-09-24 DIAGNOSIS — M50122 Cervical disc disorder at C5-C6 level with radiculopathy: Secondary | ICD-10-CM | POA: Diagnosis not present

## 2019-09-24 DIAGNOSIS — G4730 Sleep apnea, unspecified: Secondary | ICD-10-CM | POA: Diagnosis not present

## 2019-09-24 DIAGNOSIS — F17210 Nicotine dependence, cigarettes, uncomplicated: Secondary | ICD-10-CM | POA: Insufficient documentation

## 2019-09-24 DIAGNOSIS — F329 Major depressive disorder, single episode, unspecified: Secondary | ICD-10-CM | POA: Insufficient documentation

## 2019-09-24 DIAGNOSIS — Z7982 Long term (current) use of aspirin: Secondary | ICD-10-CM | POA: Insufficient documentation

## 2019-09-24 DIAGNOSIS — Z6841 Body Mass Index (BMI) 40.0 and over, adult: Secondary | ICD-10-CM | POA: Diagnosis not present

## 2019-09-24 DIAGNOSIS — M5020 Other cervical disc displacement, unspecified cervical region: Secondary | ICD-10-CM | POA: Diagnosis present

## 2019-09-24 DIAGNOSIS — K219 Gastro-esophageal reflux disease without esophagitis: Secondary | ICD-10-CM | POA: Diagnosis not present

## 2019-09-24 DIAGNOSIS — M4722 Other spondylosis with radiculopathy, cervical region: Secondary | ICD-10-CM | POA: Insufficient documentation

## 2019-09-24 DIAGNOSIS — F419 Anxiety disorder, unspecified: Secondary | ICD-10-CM | POA: Diagnosis not present

## 2019-09-24 HISTORY — PX: ANTERIOR CERVICAL DECOMP/DISCECTOMY FUSION: SHX1161

## 2019-09-24 HISTORY — DX: Obstructive sleep apnea (adult) (pediatric): G47.33

## 2019-09-24 LAB — GLUCOSE, CAPILLARY
Glucose-Capillary: 116 mg/dL — ABNORMAL HIGH (ref 70–99)
Glucose-Capillary: 160 mg/dL — ABNORMAL HIGH (ref 70–99)

## 2019-09-24 SURGERY — ANTERIOR CERVICAL DECOMPRESSION/DISCECTOMY FUSION 1 LEVEL
Anesthesia: General

## 2019-09-24 MED ORDER — ALBUTEROL SULFATE HFA 108 (90 BASE) MCG/ACT IN AERS
INHALATION_SPRAY | RESPIRATORY_TRACT | Status: AC
  Filled 2019-09-24: qty 6.7

## 2019-09-24 MED ORDER — SIMVASTATIN 20 MG PO TABS: 10.0000 mg | ORAL_TABLET | Freq: Every day | ORAL | Status: DC

## 2019-09-24 MED ORDER — LACTATED RINGERS IV SOLN: INTRAVENOUS | Status: DC | PRN

## 2019-09-24 MED ORDER — PROPOFOL 10 MG/ML IV BOLUS
INTRAVENOUS | Status: AC
  Filled 2019-09-24: qty 20

## 2019-09-24 MED ORDER — HYDROMORPHONE HCL 1 MG/ML IJ SOLN
INTRAMUSCULAR | Status: AC
  Filled 2019-09-24: qty 1

## 2019-09-24 MED ORDER — POLYETHYLENE GLYCOL 3350 17 G PO PACK: 17.0000 g | PACK | Freq: Every day | ORAL | Status: DC | PRN

## 2019-09-24 MED ORDER — PHENOL 1.4 % MT LIQD: 1.0000 | OROMUCOSAL | Status: DC | PRN

## 2019-09-24 MED ORDER — SUCCINYLCHOLINE CHLORIDE 200 MG/10ML IV SOSY
PREFILLED_SYRINGE | INTRAVENOUS | Status: DC | PRN
  Administered 2019-09-24: 200 mg via INTRAVENOUS

## 2019-09-24 MED ORDER — HYDROMORPHONE HCL 1 MG/ML IJ SOLN
0.5000 mg | INTRAMUSCULAR | Status: DC | PRN
  Administered 2019-09-24 (×3): 0.5 mg via INTRAVENOUS

## 2019-09-24 MED ORDER — ACETAMINOPHEN 650 MG RE SUPP: 650.0000 mg | RECTAL | Status: DC | PRN

## 2019-09-24 MED ORDER — BUPIVACAINE HCL (PF) 0.25 % IJ SOLN
INTRAMUSCULAR | Status: DC | PRN
  Administered 2019-09-24: 30 mL

## 2019-09-24 MED ORDER — ONDANSETRON HCL 4 MG PO TABS: 4.0000 mg | ORAL_TABLET | Freq: Three times a day (TID) | ORAL | 0 refills | Status: DC | PRN

## 2019-09-24 MED ORDER — OXYCODONE-ACETAMINOPHEN 10-325 MG PO TABS: 1.0000 | ORAL_TABLET | Freq: Four times a day (QID) | ORAL | 0 refills | Status: DC | PRN

## 2019-09-24 MED ORDER — KETAMINE HCL 10 MG/ML IJ SOLN
INTRAMUSCULAR | Status: DC | PRN
  Administered 2019-09-24 (×2): 25 mg via INTRAVENOUS

## 2019-09-24 MED ORDER — METOPROLOL SUCCINATE ER 100 MG PO TB24: 100.0000 mg | ORAL_TABLET | Freq: Every day | ORAL | Status: DC

## 2019-09-24 MED ORDER — KETAMINE HCL 50 MG/5ML IJ SOSY
PREFILLED_SYRINGE | INTRAMUSCULAR | Status: AC
  Filled 2019-09-24: qty 5

## 2019-09-24 MED ORDER — PREGABALIN 75 MG PO CAPS
75.0000 mg | ORAL_CAPSULE | Freq: Three times a day (TID) | ORAL | Status: DC
  Administered 2019-09-24: 20:00:00 75 mg via ORAL
  Filled 2019-09-24: qty 1

## 2019-09-24 MED ORDER — MORPHINE SULFATE (PF) 2 MG/ML IV SOLN
2.0000 mg | INTRAVENOUS | Status: DC | PRN
  Administered 2019-09-24: 21:00:00 2 mg via INTRAVENOUS
  Filled 2019-09-24: qty 1

## 2019-09-24 MED ORDER — LISINOPRIL 10 MG PO TABS: 10.0000 mg | ORAL_TABLET | Freq: Every day | ORAL | Status: DC

## 2019-09-24 MED ORDER — METHOCARBAMOL 500 MG PO TABS
500.0000 mg | ORAL_TABLET | Freq: Four times a day (QID) | ORAL | Status: DC | PRN
  Administered 2019-09-24 – 2019-09-25 (×3): 500 mg via ORAL
  Filled 2019-09-24 (×3): qty 1

## 2019-09-24 MED ORDER — SODIUM CHLORIDE 0.9% FLUSH: 3.0000 mL | INTRAVENOUS | Status: DC | PRN

## 2019-09-24 MED ORDER — DEXAMETHASONE SODIUM PHOSPHATE 10 MG/ML IJ SOLN
INTRAMUSCULAR | Status: DC | PRN
  Administered 2019-09-24: 10 mg via INTRAVENOUS

## 2019-09-24 MED ORDER — FENTANYL CITRATE (PF) 100 MCG/2ML IJ SOLN
INTRAMUSCULAR | Status: AC
  Filled 2019-09-24: qty 2

## 2019-09-24 MED ORDER — EPINEPHRINE PF 1 MG/ML IJ SOLN
INTRAMUSCULAR | Status: AC
  Filled 2019-09-24: qty 1

## 2019-09-24 MED ORDER — LACTATED RINGERS IV SOLN: INTRAVENOUS | Status: DC

## 2019-09-24 MED ORDER — SODIUM CHLORIDE 0.9% FLUSH: 3.0000 mL | Freq: Two times a day (BID) | INTRAVENOUS | Status: DC

## 2019-09-24 MED ORDER — ONDANSETRON HCL 4 MG/2ML IJ SOLN
INTRAMUSCULAR | Status: AC
  Filled 2019-09-24: qty 2

## 2019-09-24 MED ORDER — LIDOCAINE 2% (20 MG/ML) 5 ML SYRINGE
INTRAMUSCULAR | Status: DC | PRN
  Administered 2019-09-24: 100 mg via INTRAVENOUS

## 2019-09-24 MED ORDER — CHLORTHALIDONE 25 MG PO TABS
12.5000 mg | ORAL_TABLET | Freq: Every day | ORAL | Status: DC
  Filled 2019-09-24 (×2): qty 0.5

## 2019-09-24 MED ORDER — INSULIN ASPART 100 UNIT/ML ~~LOC~~ SOLN
0.0000 [IU] | Freq: Three times a day (TID) | SUBCUTANEOUS | Status: DC
  Administered 2019-09-25: 2 [IU] via SUBCUTANEOUS

## 2019-09-24 MED ORDER — PROPOFOL 10 MG/ML IV BOLUS
INTRAVENOUS | Status: DC | PRN
  Administered 2019-09-24: 30 mg via INTRAVENOUS
  Administered 2019-09-24: 200 mg via INTRAVENOUS
  Administered 2019-09-24: 30 mg via INTRAVENOUS

## 2019-09-24 MED ORDER — EPINEPHRINE PF 1 MG/ML IJ SOLN
INTRAMUSCULAR | Status: DC | PRN
  Administered 2019-09-24: .15 mL

## 2019-09-24 MED ORDER — METHOCARBAMOL 1000 MG/10ML IJ SOLN
500.0000 mg | Freq: Four times a day (QID) | INTRAVENOUS | Status: DC | PRN
  Filled 2019-09-24: qty 5

## 2019-09-24 MED ORDER — METFORMIN HCL 500 MG PO TABS
500.0000 mg | ORAL_TABLET | Freq: Every day | ORAL | Status: DC
  Administered 2019-09-24: 20:00:00 500 mg via ORAL
  Filled 2019-09-24: qty 1

## 2019-09-24 MED ORDER — ROCURONIUM BROMIDE 10 MG/ML (PF) SYRINGE
PREFILLED_SYRINGE | INTRAVENOUS | Status: DC | PRN
  Administered 2019-09-24: 10 mg via INTRAVENOUS
  Administered 2019-09-24: 40 mg via INTRAVENOUS
  Administered 2019-09-24 (×2): 20 mg via INTRAVENOUS

## 2019-09-24 MED ORDER — DEXAMETHASONE SODIUM PHOSPHATE 10 MG/ML IJ SOLN
INTRAMUSCULAR | Status: AC
  Filled 2019-09-24: qty 1

## 2019-09-24 MED ORDER — METHOCARBAMOL 500 MG PO TABS: 500.0000 mg | ORAL_TABLET | Freq: Three times a day (TID) | ORAL | 0 refills | Status: DC | PRN

## 2019-09-24 MED ORDER — FENTANYL CITRATE (PF) 100 MCG/2ML IJ SOLN
25.0000 ug | INTRAMUSCULAR | Status: DC | PRN
  Administered 2019-09-24 (×3): 50 ug via INTRAVENOUS

## 2019-09-24 MED ORDER — VORTIOXETINE HBR 10 MG PO TABS
10.0000 mg | ORAL_TABLET | Freq: Every day | ORAL | Status: DC
  Administered 2019-09-24: 21:00:00 10 mg via ORAL
  Filled 2019-09-24 (×3): qty 1

## 2019-09-24 MED ORDER — OXYCODONE HCL 5 MG PO TABS: 5.0000 mg | ORAL_TABLET | ORAL | Status: DC | PRN

## 2019-09-24 MED ORDER — MENTHOL 3 MG MT LOZG: 1.0000 | LOZENGE | OROMUCOSAL | Status: DC | PRN

## 2019-09-24 MED ORDER — ACETAMINOPHEN 325 MG PO TABS
650.0000 mg | ORAL_TABLET | ORAL | Status: DC | PRN
  Administered 2019-09-24 – 2019-09-25 (×2): 650 mg via ORAL
  Filled 2019-09-24 (×2): qty 2

## 2019-09-24 MED ORDER — SODIUM CHLORIDE 0.9 % IV SOLN: 250.0000 mL | INTRAVENOUS | Status: DC

## 2019-09-24 MED ORDER — ONDANSETRON HCL 4 MG/2ML IJ SOLN
INTRAMUSCULAR | Status: DC | PRN
  Administered 2019-09-24: 4 mg via INTRAVENOUS

## 2019-09-24 MED ORDER — ACETAMINOPHEN 500 MG PO TABS
1000.0000 mg | ORAL_TABLET | Freq: Once | ORAL | Status: AC
  Administered 2019-09-24: 13:00:00 500 mg via ORAL
  Filled 2019-09-24: qty 2

## 2019-09-24 MED ORDER — PHENYLEPHRINE HCL-NACL 10-0.9 MG/250ML-% IV SOLN
INTRAVENOUS | Status: DC | PRN
  Administered 2019-09-24: 25 ug/min via INTRAVENOUS

## 2019-09-24 MED ORDER — 0.9 % SODIUM CHLORIDE (POUR BTL) OPTIME
TOPICAL | Status: DC | PRN
  Administered 2019-09-24: 14:00:00 1000 mL

## 2019-09-24 MED ORDER — FENTANYL CITRATE (PF) 250 MCG/5ML IJ SOLN
INTRAMUSCULAR | Status: AC
  Filled 2019-09-24: qty 5

## 2019-09-24 MED ORDER — FENTANYL CITRATE (PF) 100 MCG/2ML IJ SOLN
INTRAMUSCULAR | Status: DC | PRN
  Administered 2019-09-24: 50 ug via INTRAVENOUS
  Administered 2019-09-24: 100 ug via INTRAVENOUS
  Administered 2019-09-24: 50 ug via INTRAVENOUS

## 2019-09-24 MED ORDER — MIDAZOLAM HCL 2 MG/2ML IJ SOLN
INTRAMUSCULAR | Status: AC
  Filled 2019-09-24: qty 2

## 2019-09-24 MED ORDER — LIDOCAINE 2% (20 MG/ML) 5 ML SYRINGE
INTRAMUSCULAR | Status: AC
  Filled 2019-09-24: qty 5

## 2019-09-24 MED ORDER — DOCUSATE SODIUM 100 MG PO CAPS
100.0000 mg | ORAL_CAPSULE | Freq: Two times a day (BID) | ORAL | Status: DC
  Filled 2019-09-24: qty 1

## 2019-09-24 MED ORDER — ONDANSETRON HCL 4 MG/2ML IJ SOLN: 4.0000 mg | Freq: Four times a day (QID) | INTRAMUSCULAR | Status: DC | PRN

## 2019-09-24 MED ORDER — BUPIVACAINE HCL (PF) 0.25 % IJ SOLN
INTRAMUSCULAR | Status: AC
  Filled 2019-09-24: qty 30

## 2019-09-24 MED ORDER — MIDAZOLAM HCL 5 MG/5ML IJ SOLN
INTRAMUSCULAR | Status: DC | PRN
  Administered 2019-09-24: 2 mg via INTRAVENOUS

## 2019-09-24 MED ORDER — INSULIN ASPART 100 UNIT/ML ~~LOC~~ SOLN: 0.0000 [IU] | Freq: Every day | SUBCUTANEOUS | Status: DC

## 2019-09-24 MED ORDER — SUGAMMADEX SODIUM 200 MG/2ML IV SOLN
INTRAVENOUS | Status: DC | PRN
  Administered 2019-09-24: 250 mg via INTRAVENOUS

## 2019-09-24 MED ORDER — ONDANSETRON HCL 4 MG PO TABS: 4.0000 mg | ORAL_TABLET | Freq: Four times a day (QID) | ORAL | Status: DC | PRN

## 2019-09-24 MED ORDER — METHOCARBAMOL 500 MG PO TABS
ORAL_TABLET | ORAL | Status: AC
  Filled 2019-09-24: qty 1

## 2019-09-24 MED ORDER — OXYCODONE HCL 5 MG PO TABS
10.0000 mg | ORAL_TABLET | ORAL | Status: DC | PRN
  Administered 2019-09-24 – 2019-09-25 (×5): 10 mg via ORAL
  Filled 2019-09-24 (×5): qty 2

## 2019-09-24 MED ORDER — THROMBIN 5000 UNITS EX SOLR
CUTANEOUS | Status: AC
  Filled 2019-09-24: qty 5000

## 2019-09-24 MED ORDER — FLEET ENEMA 7-19 GM/118ML RE ENEM: 1.0000 | ENEMA | Freq: Once | RECTAL | Status: DC | PRN

## 2019-09-24 MED ORDER — ALBUTEROL SULFATE HFA 108 (90 BASE) MCG/ACT IN AERS
INHALATION_SPRAY | RESPIRATORY_TRACT | Status: DC | PRN
  Administered 2019-09-24: 5 via RESPIRATORY_TRACT
  Administered 2019-09-24: 2 via RESPIRATORY_TRACT

## 2019-09-24 MED ORDER — CEFAZOLIN SODIUM-DEXTROSE 1-4 GM/50ML-% IV SOLN
1.0000 g | Freq: Three times a day (TID) | INTRAVENOUS | Status: AC
  Administered 2019-09-24 – 2019-09-25 (×2): 1 g via INTRAVENOUS
  Filled 2019-09-24 (×2): qty 50

## 2019-09-24 SURGICAL SUPPLY — 67 items
AGENT HMST KT MTR STRL THRMB (HEMOSTASIS)
BIT DRILL SKYLINE 12MM (BIT) IMPLANT
BLADE CLIPPER SURG (BLADE) IMPLANT
BONE VIVIGEN FORMABLE 1.3CC (Bone Implant) ×3 IMPLANT
CABLE BIPOLOR RESECTION CORD (MISCELLANEOUS) ×3 IMPLANT
CANISTER SUCT 3000ML PPV (MISCELLANEOUS) ×3 IMPLANT
CLOSURE STERI-STRIP 1/2X4 (GAUZE/BANDAGES/DRESSINGS) ×1
CLSR STERI-STRIP ANTIMIC 1/2X4 (GAUZE/BANDAGES/DRESSINGS) ×2 IMPLANT
COVER MAYO STAND STRL (DRAPES) ×9 IMPLANT
COVER SURGICAL LIGHT HANDLE (MISCELLANEOUS) ×6 IMPLANT
COVER WAND RF STERILE (DRAPES) ×3 IMPLANT
DEVICE ENDSKLTN IMPLANT SM 7MM (Cage) IMPLANT
DRAPE C-ARM 42X72 X-RAY (DRAPES) ×3 IMPLANT
DRAPE POUCH INSTRU U-SHP 10X18 (DRAPES) ×3 IMPLANT
DRAPE SURG 17X23 STRL (DRAPES) ×3 IMPLANT
DRAPE U-SHAPE 47X51 STRL (DRAPES) ×3 IMPLANT
DRILL BIT SKYLINE 12MM (BIT) ×3
DRSG MEPILEX BORDER 4X4 (GAUZE/BANDAGES/DRESSINGS) ×2 IMPLANT
DRSG OPSITE POSTOP 3X4 (GAUZE/BANDAGES/DRESSINGS) ×3 IMPLANT
DRSG OPSITE POSTOP 4X6 (GAUZE/BANDAGES/DRESSINGS) ×2 IMPLANT
DURAPREP 26ML APPLICATOR (WOUND CARE) ×3 IMPLANT
ELECT COATED BLADE 2.86 ST (ELECTRODE) ×3 IMPLANT
ELECT PENCIL ROCKER SW 15FT (MISCELLANEOUS) ×3 IMPLANT
ELECT REM PT RETURN 9FT ADLT (ELECTROSURGICAL) ×3
ELECTRODE REM PT RTRN 9FT ADLT (ELECTROSURGICAL) ×1 IMPLANT
ENDOSKELETON IMPLANT SM 7MM (Cage) ×3 IMPLANT
GLOVE BIO SURGEON STRL SZ 6.5 (GLOVE) ×2 IMPLANT
GLOVE BIO SURGEONS STRL SZ 6.5 (GLOVE) ×1
GLOVE BIOGEL PI IND STRL 6.5 (GLOVE) ×1 IMPLANT
GLOVE BIOGEL PI IND STRL 8.5 (GLOVE) ×1 IMPLANT
GLOVE BIOGEL PI INDICATOR 6.5 (GLOVE) ×2
GLOVE BIOGEL PI INDICATOR 8.5 (GLOVE) ×2
GLOVE SS BIOGEL STRL SZ 8.5 (GLOVE) ×1 IMPLANT
GLOVE SUPERSENSE BIOGEL SZ 8.5 (GLOVE) ×2
GOWN STRL REUS W/ TWL LRG LVL3 (GOWN DISPOSABLE) ×1 IMPLANT
GOWN STRL REUS W/TWL 2XL LVL3 (GOWN DISPOSABLE) ×3 IMPLANT
GOWN STRL REUS W/TWL LRG LVL3 (GOWN DISPOSABLE) ×3
GRAFT BNE MATRIX VG FRMBL SM 1 (Bone Implant) IMPLANT
KIT BASIN OR (CUSTOM PROCEDURE TRAY) ×3 IMPLANT
KIT TURNOVER KIT B (KITS) ×3 IMPLANT
NDL SPNL 18GX3.5 QUINCKE PK (NEEDLE) ×1 IMPLANT
NEEDLE HYPO 22GX1.5 SAFETY (NEEDLE) ×3 IMPLANT
NEEDLE SPNL 18GX3.5 QUINCKE PK (NEEDLE) ×3 IMPLANT
NS IRRIG 1000ML POUR BTL (IV SOLUTION) ×3 IMPLANT
PACK ORTHO CERVICAL (CUSTOM PROCEDURE TRAY) ×3 IMPLANT
PACK UNIVERSAL I (CUSTOM PROCEDURE TRAY) ×3 IMPLANT
PAD ARMBOARD 7.5X6 YLW CONV (MISCELLANEOUS) ×6 IMPLANT
PATTIES SURGICAL .25X.25 (GAUZE/BANDAGES/DRESSINGS) IMPLANT
PIN DISTRACTION 14 (PIN) ×4 IMPLANT
PLATE ONE LEVEL SKYLINE 14MM (Plate) ×2 IMPLANT
POSITIONER HEAD DONUT 9IN (MISCELLANEOUS) ×3 IMPLANT
RESTRAINT LIMB HOLDER UNIV (RESTRAINTS) ×3 IMPLANT
SCREW SELF DRILL SKYLINE 12MM (Screw) ×8 IMPLANT
SPONGE INTESTINAL PEANUT (DISPOSABLE) ×3 IMPLANT
SPONGE SURGIFOAM ABS GEL SZ50 (HEMOSTASIS) ×3 IMPLANT
SURGIFLO W/THROMBIN 8M KIT (HEMOSTASIS) IMPLANT
SUT BONE WAX W31G (SUTURE) ×3 IMPLANT
SUT MON AB 3-0 SH 27 (SUTURE) ×3
SUT MON AB 3-0 SH27 (SUTURE) ×1 IMPLANT
SUT VIC AB 2-0 CT1 18 (SUTURE) ×3 IMPLANT
SYR BULB IRRIGATION 50ML (SYRINGE) ×3 IMPLANT
SYR CONTROL 10ML LL (SYRINGE) ×3 IMPLANT
TAPE CLOTH 4X10 WHT NS (GAUZE/BANDAGES/DRESSINGS) ×3 IMPLANT
TAPE UMBILICAL COTTON 1/8X30 (MISCELLANEOUS) ×3 IMPLANT
TOWEL GREEN STERILE (TOWEL DISPOSABLE) ×3 IMPLANT
TOWEL GREEN STERILE FF (TOWEL DISPOSABLE) ×3 IMPLANT
WATER STERILE IRR 1000ML POUR (IV SOLUTION) ×3 IMPLANT

## 2019-09-24 NOTE — Progress Notes (Signed)
Orthopedic Tech Progress Note Patient Details:  Bryan Huffman 1971-05-20 [id number] OR RN called requesting a LARGE PHILLY COLLAR Ortho Devices Type of Ortho Device: [place] cervical collar Ortho Device/Splint Location: neck Ortho Device/Splint Interventions: Other (comment)   Post Interventions Patient Tolerated: Other (comment) Instructions Provided: Other (comment)   Janit Pagan 09/24/2019, 4:28 PM

## 2019-09-24 NOTE — Op Note (Signed)
Operative report  Preoperative diagnosis: Cervical spondylitic radiculopathy.  Right C6 cervical radiculopathy due to C5-6 degenerative cervical disc disease  Postoperative diagnosis: Same  Procedure: Anterior cervical discectomy and fusion C5-6  First Assistant: Glynis Smiles, PA  Implants: Titan Nano fuse intervertebral cage 7 mm small lordotic.  14 mm anterior cervical DePuy skyline plate.  14 mm locking screws into the body of see 5 and 12 mm locking screws into the body of C6.  Allograft:  vivogen  Complications: None  Indications: Andrius Andrepont is a very pleasant 48 year old gentleman with significant cervical spondylitic radiculopathy.  He has been complaining of severe neck and radicular right arm pain for some time now.  A selective nerve root block provided temporary relief.  As a result of the more quality of life and the failure to improve with conservative measures we elected to move forward with surgery.  All appropriate risks benefits and alternatives were discussed with the patient and consent was obtained.  Operative note patient is brought the operating room placed upon the operating room table.  After successful induction of general anesthesia endotracheal ovation teds SCDs were applied.  The anterior cervical spine was prepped and draped in a standard fashion.  Timeout was taken to confirm patient procedure and all other important data.  Imaging studies were used to identify the C5-6 disc space.  I marked out the incision site and infiltrated with quarter percent Marcaine with epinephrine.  Transverse incision was made and sharp dissection was carried out down to the platysma.  I isolated and incised the platysma.  The sternocleidomastoid came into view and I began dissecting along the medial border.  This was a standard Smith-Robinson approach to the anterior cervical spine.  Identified the omohyoid muscle and sacrificed it for better visualization.  I continue to bluntly  dissect down through the deep cervical and prevertebral fascia the trachea and esophagus were mobilized to the right and cloud retractor was used to retract and hold them in position.  The anterior cervical spine now came into view.  I placed the needle into the C5-6 disc space and took an x-ray to confirm I was about the appropriate level.  All individuals in the room confirmed the count and we confirmed with the consent that we are at the appropriate level.  The C5/6 disc space was marked with the bovie.  The longus coli muscles were mobilized from the superior aspect of C5 down to the inferior aspect of C6.  60 mm length Caspar retracting blades were placed underneath the longus coli muscle and the endotracheal cuff was deflated I explained the retractors to the appropriate width and then reinflated the endotracheal cuff.   An annulotomy performed with a 15 blade scalpel and using pituitary rongeurs I began removing the bulk of the disc material.  A 2 mm Kerrison rongeur was then used to take down the bone spur from the inferior aspect of the C5 vertebral body.  I then placed distraction pins into the body of C5 and C6 and then used my lamina spreader to open the intervertebral space I maintained the distraction with the distraction pin set.  I continued to to remove disc material using my curettes until I was down to the posterior aspect of the vertebral body.  Using a 1 mm Kerrison rongeur and my nerve hook I gently dissected through the posterior annulus and posterior longitudinal ligament using a 1 mm Kerrison rongeur I resected the PLL and the posterior osteophyte from the  vertebral body of C6.  At this point I then can get under the uncovertebral joint bilaterally and move the osteophytes.  At this point I felt as though my posterior decompression was adequate I could easily pass my nerve hook behind the vertebral body of C6 and C5 and under the uncovertebral joint.  At this point I rasped the endplates  and then trialed the intervertebral space.  I decided used a small 7 mm lordotic implant as it provided the best fit.  It was malleted to the appropriate cath after it was packed with the appropriate allograft.  I confirmed satisfactory position using fluoroscopy.  The remainder of the bone graft was then placed anterior to the cage.  I remove the traction pins and sealed the bone holes with bone wax.  14 mm anterior cervical plate was then secured to the vertebral body with self drilling screws.  All 4 screws had excellent purchase.  Once all screws were tightened down I then torqued the locking mechanism according manufacture standards.  At this point I felt we had adequate overall positioning of the hardware.  I irrigated the wound copiously normal saline and removed my retractors and identified any bleeding sources and coagulated with bipolar electrocautery.  I then placed FloSeal to aid in the overall hemostasis.  Final x-rays were taken both the AP and lateral planes confirming satisfactory position of the hardware.  After final irrigation I inspected the wound and there was no active bleeding.  However given the size of his neck, and his underlying COPD and potential increased coughing I elected to place a postoperative drain.  The drain was taken out through a separate stab incision.  The trach and esophagus were returned to midline and I closed the platysma with interrupted 2-0 Vicryl suture.  3-0 Monocryl was then used to close the skin Steri-Strips dry dressing and a Philadelphia collar were applied.  Patient was ultimately extubated transfer the PACU without incident.  The end of the case all needle sponge counts were correct.  There were no adverse intraoperative events.

## 2019-09-24 NOTE — Discharge Instructions (Signed)

## 2019-09-24 NOTE — Transfer of Care (Signed)
Immediate Anesthesia Transfer of Care Note  Patient: Bryan Huffman  Procedure(s) Performed: ANTERIOR CERVICAL DECOMPRESSION/DISCECTOMY FUSION C5-6 (N/A )  Patient Location: PACU  Anesthesia Type:General  Level of Consciousness: awake and alert   Airway & Oxygen Therapy: Patient Spontanous Breathing and Patient connected to face mask oxygen  Post-op Assessment: Report given to RN and Post -op Vital signs reviewed and stable  Post vital signs: Reviewed and stable  Last Vitals:  Vitals Value Taken Time  BP 155/109 09/24/19 1712  Temp    Pulse 81 09/24/19 1713  Resp 14 09/24/19 1713  SpO2 98 % 09/24/19 1713  Vitals shown include unvalidated device data.  Last Pain:  Vitals:   09/24/19 1224  TempSrc: Oral  PainSc: 7       Patients Stated Pain Goal: 3 (40/10/27 2536)  Complications: No apparent anesthesia complications

## 2019-09-24 NOTE — Anesthesia Procedure Notes (Addendum)
Procedure Name: Intubation Date/Time: 09/24/2019 2:20 PM Performed by: Jenne Campus, CRNA Pre-anesthesia Checklist: Patient identified, Emergency Drugs available, Suction available and Patient being monitored Patient Re-evaluated:Patient Re-evaluated prior to induction Oxygen Delivery Method: Circle System Utilized Preoxygenation: Pre-oxygenation with 100% oxygen Induction Type: IV induction Ventilation: Mask ventilation without difficulty and Two handed mask ventilation required Laryngoscope Size: Glidescope and 4 Grade View: Grade I Tube type: Oral Tube size: 7.5 mm Number of attempts: 1 Airway Equipment and Method: Stylet and Oral airway Placement Confirmation: ETT inserted through vocal cords under direct vision,  positive ETCO2 and breath sounds checked- equal and bilateral Secured at: 22 cm Tube secured with: Tape Dental Injury: Teeth and Oropharynx as per pre-operative assessment  Difficulty Due To: Difficulty was anticipated, Difficult Airway- due to large tongue and Difficult Airway- due to reduced neck mobility

## 2019-09-24 NOTE — Brief Op Note (Signed)
09/24/2019  4:58 PM  PATIENT:  Bryan Huffman  48 y.o. male  PRE-OPERATIVE DIAGNOSIS:  Degenerative disc disease, foraminal stenosis, cervical radiculopathy  POST-OPERATIVE DIAGNOSIS:  e disc disease, foraminal stenosis, cervical radiculopathy  PROCEDURE:  Procedure(s) with comments: ANTERIOR CERVICAL DECOMPRESSION/DISCECTOMY FUSION C5-6 (N/A) - 3.5 hrs  SURGEON:  Surgeon(s) and Role:    Melina Schools, MD - Primary  PHYSICIAN ASSISTANT:   ASSISTANTS: Amanda Ward, PA   ANESTHESIA:   general  EBL:  25 mL   BLOOD ADMINISTERED:none  DRAINS: (1) TLS Drain(s) to suction in the neck   LOCAL MEDICATIONS USED:  MARCAINE     SPECIMEN:  No Specimen  DISPOSITION OF SPECIMEN:  N/A  COUNTS:  YES  TOURNIQUET:  * No tourniquets in log *  DICTATION: .Dragon Dictation  PLAN OF CARE: Admit for overnight observation  PATIENT DISPOSITION:  PACU - hemodynamically stable.

## 2019-09-24 NOTE — Progress Notes (Signed)
Orthopedic Tech Progress Note Patient Details:  Tayshawn Purnell [facility] [birth year] [phone] Pt already has a brace. Ortho Devices Type of Ortho Device: [place] cervical collar Ortho Device/Splint Location: neck Ortho Device/Splint Interventions: Other (comment)   Post Interventions Patient Tolerated: Other (comment) Instructions Provided: Other (comment)   Ladell Pier [facility] 09/24/2019, 9:21 PM

## 2019-09-24 NOTE — H&P (Signed)
Addendum H&P by Dr. Rolena Infante:  There has been no change in the patient's physical exam from his last office visit on 09/09/2019.  Patient has been seen by his primary care physician and cardiologist and has been medically cleared for the surgery.  Plan on moving forward with the ACDF C5-6 for right C6 radiculopathy.  I have gone over the surgery as well as the risks and benefits with the patient and consent was obtained.  All of his questions were encouraged and addressed.

## 2019-09-25 DIAGNOSIS — M4722 Other spondylosis with radiculopathy, cervical region: Secondary | ICD-10-CM | POA: Diagnosis not present

## 2019-09-25 LAB — HEMOGLOBIN A1C
Hgb A1c MFr Bld: 7 % — ABNORMAL HIGH (ref 4.8–5.6)
Mean Plasma Glucose: 154.2 mg/dL

## 2019-09-25 LAB — GLUCOSE, CAPILLARY: Glucose-Capillary: 133 mg/dL — ABNORMAL HIGH (ref 70–99)

## 2019-09-25 NOTE — Anesthesia Postprocedure Evaluation (Signed)
Anesthesia Post Note  Patient: Bryan Huffman  Procedure(s) Performed: ANTERIOR CERVICAL DECOMPRESSION/DISCECTOMY FUSION C5-6 (N/A )     Patient location during evaluation: PACU Anesthesia Type: General Level of consciousness: awake and alert Pain management: pain level controlled Vital Signs Assessment: post-procedure vital signs reviewed and stable Respiratory status: spontaneous breathing, nonlabored ventilation, respiratory function stable and patient connected to nasal cannula oxygen Cardiovascular status: blood pressure returned to baseline and stable Postop Assessment: no apparent nausea or vomiting Anesthetic complications: no    Last Vitals:  Vitals:   09/25/19 0338 09/25/19 0800  BP: (!) 148/98 140/85  Pulse: 62 76  Resp: 20 20  Temp: 36.8 C 36.5 C  SpO2: 98% 98%    Last Pain:  Vitals:   09/25/19 0800  TempSrc: Oral  PainSc:                  Lilliane Sposito

## 2019-09-25 NOTE — Evaluation (Addendum)
Occupational Therapy Evaluation Patient Details Name: Bryan Huffman MRN: [id number] DOB: 01/31/1971 Today's Date: 09/25/2019    History of Present Illness Pt is a 48 y/o male who presents s/p ACDF on 09/24/2019. PMH signfiicant for 4 prior lumbar surgeries, HTN, OSA.    Clinical Impression   This 48 y/o male presents with the above. PTA pt reports independence with ADL and functional mobility. Pt performing functional mobility this session without AD at supervision level. He currently requires intermittent minA for LB ADL, setup- mod independent with seated UB ADL. Educated and reviewed with pt re: cervical precautions, brace management, safety and compensatory techniques for completing ADL and functional transfers while maintaining precautions with pt verbalizing understanding. Pt reports plans to return home with 48 y/o and parents who can assist with iADL PRN. Questions answered throughout with no further acute OT needs identified. Feel pt is safe to return home from OT standpoint once medically ready. Acute OT to sign off, thank you for this referral.     Follow Up Recommendations  No OT follow up;Supervision - Intermittent    Equipment Recommendations  None recommended by OT(pt declines need for DME for use in shower)           Precautions / Restrictions Precautions Precautions: Fall;Cervical Precaution Booklet Issued: Yes (comment) Precaution Comments: Reviewed precautions verbally throughout functional mobility.  Required Braces or Orthoses: Cervical Brace Cervical Brace: Hard collar(pt only w/ shower collar in room this AM, RN following up) Restrictions Weight Bearing Restrictions: No      Mobility Bed Mobility Overal bed mobility: Needs Assistance Bed Mobility: Rolling;Sidelying to Sit Rolling: Supervision Sidelying to sit: Supervision;HOB elevated       General bed mobility comments: pt required cues to use log roll technique   Transfers Overall transfer  level: Modified independent Equipment used: None                  Balance Overall balance assessment: Mild deficits observed, not formally tested                                         ADL either performed or assessed with clinical judgement   ADL Overall ADL's : Needs assistance/impaired Eating/Feeding: Modified independent;Sitting   Grooming: Supervision/safety;Standing   Upper Body Bathing: Set up;Min guard;Sitting   Lower Body Bathing: Min guard;Sit to/from stand   Upper Body Dressing : Set up;Sitting   Lower Body Dressing: Min guard;Sit to/from stand Lower Body Dressing Details (indicate cue type and reason): educated in use of reacher for increased ease/independence in task completion with pt verbalizing understanding, declined getting dressed during this session Toilet Transfer: Supervision/safety;Ambulation Toilet Transfer Details (indicate cue type and reason): simulated via transfer to/from EOB Toileting- Clothing Manipulation and Hygiene: Min guard;Sit to/from Nurse, children's Details (indicate cue type and reason): reviewed safety during bathing ADL, pt reports not able to get incision wet initially so plans to complete sponge bath; declines need for shower seat at this time Functional mobility during ADLs: Supervision/safety General ADL Comments: reviewed cervical precautions, safety and compensatory techniques for completing ADL and functional transfers     Vision         Perception     Praxis      Pertinent Vitals/Pain Pain Assessment: Faces Faces Pain Scale: Hurts even more Pain Location: reports posterior aspect of neck > anterior Pain Descriptors /  Indicators: Discomfort;Guarding;Sore Pain Intervention(s): Limited activity within patient's tolerance;Monitored during session;Repositioned     Hand Dominance Right   Extremity/Trunk Assessment Upper Extremity Assessment Upper Extremity Assessment: Overall WFL for  tasks assessed;RUE deficits/detail;LUE deficits/detail RUE Deficits / Details: overall appears WFL for basic tasks today, denies numbness/tingling, hx of R hand surgeries, some edema noted pt reports due to infiltrated IV  RUE Sensation: WNL RUE Coordination: WNL LUE Deficits / Details: overall appears WFL for basic tasks today, denies numbness/tingling, some edema noted in upper aspect of UE pt reports due to infiltrated IV  LUE Sensation: WNL LUE Coordination: WNL   Lower Extremity Assessment Lower Extremity Assessment: Defer to PT evaluation   Cervical / Trunk Assessment Cervical / Trunk Assessment: Other exceptions Cervical / Trunk Exceptions: s/p cervical sx   Communication Communication Communication: No difficulties   Cognition Arousal/Alertness: Awake/alert Behavior During Therapy: WFL for tasks assessed/performed Overall Cognitive Status: Within Functional Limits for tasks assessed                                     General Comments       Exercises     Shoulder Instructions      Home Living Family/patient expects to be discharged to:: Private residence Living Arrangements: Parent;Children(parents and 52 y/o) Available Help at Discharge: Family Type of Home: House Home Access: Stairs to enter Secretary/administrator of Steps: 3   Home Layout: One level     Bathroom Shower/Tub: Producer, television/film/video: (one of each)     Home Equipment: Mudlogger: Reacher        Prior Functioning/Environment Level of Independence: Independent        Comments: reports currently not working much due to previous back sx and now cervical sx, inquiring about disability services and plans to follow up with MD for necessary paperwork        OT Problem List: Decreased activity tolerance;Decreased knowledge of precautions;Obesity;Pain;Decreased range of motion      OT Treatment/Interventions:      OT Goals(Current  goals can be found in the care plan section) Acute Rehab OT Goals Patient Stated Goal: home today OT Goal Formulation: With patient Time For Goal Achievement: 10/09/19 Potential to Achieve Goals: Good  OT Frequency:     Barriers to D/C:            Co-evaluation              AM-PAC OT "6 Clicks" Daily Activity     Outcome Measure Help from another person eating meals?: None Help from another person taking care of personal grooming?: None Help from another person toileting, which includes using toliet, bedpan, or urinal?: None Help from another person bathing (including washing, rinsing, drying)?: A Little Help from another person to put on and taking off regular upper body clothing?: None Help from another person to put on and taking off regular lower body clothing?: A Little 6 Click Score: 22   End of Session Equipment Utilized During Treatment: Cervical collar Nurse Communication: Mobility status  Activity Tolerance: Patient tolerated treatment well Patient left: with call bell/phone within reach;Other (comment)(seated EOB)  OT Visit Diagnosis: Other abnormalities of gait and mobility (R26.89);Pain Pain - part of body: (neck)                Time: 4270-6237 OT Time Calculation (min): 22 min Charges:  OT General  Charges $OT Visit: 1 Visit OT Evaluation $OT Eval Low Complexity: 1 Low  Marcy SirenBreanna Shacarra Choe, OT Supplemental Rehabilitation Services Pager [phone] Office [phone]   Orlando PennerBreanna L Kaily Wragg 09/25/2019, 12:16 PM

## 2019-09-25 NOTE — Progress Notes (Signed)
Patient is discharged from room 3C03 at this time. Alert and in stable condition. IV site d/c'd as well as drain. Instructions read to patient with understanding verbalized. Left unit via wheelchair with all belongings at side.

## 2019-09-25 NOTE — Progress Notes (Signed)
    Subjective: Procedure(s) (LRB): ANTERIOR CERVICAL DECOMPRESSION/DISCECTOMY FUSION C5-6 (N/A) 1 Day Post-Op  Patient reports pain as 2 on 0-10 scale.  Reports decreased arm pain reports incisional neck pain   Positive void Negative bowel movement Positive flatus Negative chest pain or shortness of breath  Objective: Vital signs in last 24 hours: Temp:  [97.6 F (36.4 C)-98.2 F (36.8 C)] 98.2 F (36.8 C) (12/17 0338) Pulse Rate:  [62-99] 62 (12/17 0338) Resp:  [12-21] 20 (12/17 0338) BP: (129-164)/(77-105) 148/98 (12/17 0338) SpO2:  [93 %-99 %] 98 % (12/17 0338) Weight:  [117.3 kg] 117.3 kg (12/16 1224)  Intake/Output from previous day: 12/16 0701 - 12/17 0700 In: 1200 [I.V.:1200] Out: 37 [Drains:12; Blood:25]  Labs: Recent Labs    09/22/19 1052  WBC 6.4  RBC 4.43  HCT 40.2  PLT 278   Recent Labs    09/22/19 1052  NA 135  K 4.2  CL 100  CO2 24  BUN 18  CREATININE 0.89  GLUCOSE 104*  CALCIUM 9.5   Recent Labs    09/22/19 1052  INR 1.0    Physical Exam: Neurologically intact ABD soft Intact pulses distally Incision: dressing C/D/I Compartment soft Body mass index is 41.73 kg/m.  Assessment/Plan: Patient stable  xrays n/a Mobilization with physical therapy Encourage incentive spirometry Continue care  Advance diet Up with therapy  Overall patient is doing well.  Radicular arm pain is improving.  Drain output has been minimal. Plan: We will remove drain and apply new dressing.  Plan on discharge to home this a.m.  Follow-up with me in 2 weeks.  Patient reports that he already has pain medication and muscle relaxer at home provided by his pain management provider.  He can continue using that.  Melina Schools, MD [facility] [phone]

## 2019-09-25 NOTE — Evaluation (Signed)
Physical Therapy Evaluation and Discharge Patient Details Name: Bryan Huffman MRN: [id number] DOB: 13-Mar-1971 Today's Date: 09/25/2019   History of Present Illness  Pt is a 48 y/o male who presents s/p ACDF on 09/24/2019. PMH signfiicant for 4 prior lumbar surgeries, HTN, OSA.   Clinical Impression  Patient evaluated by Physical Therapy with no further acute PT needs identified. All education has been completed and the patient has no further questions. Pt was able to demonstrate transfers and ambulation with gross modified independence. Pt was educated on precautions, brace application/wearing schedule, appropriate activity progression, and car transfer. See below for any follow-up Physical Therapy or equipment needs. PT is signing off. Thank you for this referral.      Follow Up Recommendations No PT follow up;Supervision - Intermittent    Equipment Recommendations  None recommended by PT    Recommendations for Other Services       Precautions / Restrictions Precautions Precautions: Fall;Cervical Precaution Booklet Issued: Yes (comment) Precaution Comments: Reviewed precautions verbally throughout functional mobility.  Required Braces or Orthoses: Cervical Brace Cervical Brace: Hard collar Restrictions Weight Bearing Restrictions: No      Mobility  Bed Mobility Overal bed mobility: Needs Assistance Bed Mobility: Rolling;Sidelying to Sit Rolling: Supervision Sidelying to sit: Supervision;HOB elevated       General bed mobility comments: pt required cues to use log roll technique   Transfers Overall transfer level: Modified independent Equipment used: None             General transfer comment: No assist required. No unsteadiness noted.   Ambulation/Gait Ambulation/Gait assistance: Modified independent (Device/Increase time) Gait Distance (Feet): 400 Feet Assistive device: None Gait Pattern/deviations: Step-through pattern;Decreased stride length;Trunk  flexed Gait velocity: Mildly Decreased Gait velocity interpretation: >2.62 ft/sec, indicative of community ambulatory    Stairs Stairs: Yes Stairs assistance: Supervision Stair Management: One rail Right Number of Stairs: 3 General stair comments: VC's for sequencing and general safety. No assist required.   Wheelchair Mobility    Modified Rankin (Stroke Patients Only)       Balance Overall balance assessment: Mild deficits observed, not formally tested                                           Pertinent Vitals/Pain Pain Assessment: Faces Faces Pain Scale: Hurts even more Pain Location: reports posterior aspect of neck > anterior Pain Descriptors / Indicators: Discomfort;Guarding;Sore Pain Intervention(s): Limited activity within patient's tolerance;Monitored during session;Repositioned    Home Living Family/patient expects to be discharged to:: Private residence Living Arrangements: Parent;Children(parents and 3 y/o) Available Help at Discharge: Family Type of Home: House Home Access: Stairs to enter   Secretary/administrator of Steps: 3 Home Layout: One level Home Equipment: Adaptive equipment      Prior Function Level of Independence: Independent         Comments: reports currently not working much due to previous back sx and now cervical sx, inquiring about disability services and plans to follow up with MD for necessary paperwork     Hand Dominance   Dominant Hand: Right    Extremity/Trunk Assessment   Upper Extremity Assessment Upper Extremity Assessment: Defer to OT evaluation RUE Deficits / Details: overall appears WFL for basic tasks today, denies numbness/tingling, hx of R hand surgeries, some edema noted pt reports due to infiltrated IV  RUE Sensation: WNL RUE Coordination: WNL LUE Deficits /  Details: overall appears WFL for basic tasks today, denies numbness/tingling, some edema noted in upper aspect of UE pt reports due to  infiltrated IV  LUE Sensation: WNL LUE Coordination: WNL    Lower Extremity Assessment Lower Extremity Assessment: Overall WFL for tasks assessed    Cervical / Trunk Assessment Cervical / Trunk Assessment: Other exceptions Cervical / Trunk Exceptions: s/p cervical sx  Communication   Communication: No difficulties  Cognition Arousal/Alertness: Awake/alert Behavior During Therapy: WFL for tasks assessed/performed Overall Cognitive Status: Within Functional Limits for tasks assessed                                        General Comments      Exercises     Assessment/Plan    PT Assessment Patient needs continued PT services  PT Problem List Decreased strength;Decreased activity tolerance;Decreased balance;Decreased mobility;Decreased knowledge of use of DME;Decreased safety awareness;Decreased knowledge of precautions;Pain       PT Treatment Interventions DME instruction;Gait training;Functional mobility training;Therapeutic activities;Therapeutic exercise;Patient/family education;Stair training;Neuromuscular re-education    PT Goals (Current goals can be found in the Care Plan section)  Acute Rehab PT Goals Patient Stated Goal: home today PT Goal Formulation: All assessment and education complete, DC therapy    Frequency Min 5X/week   Barriers to discharge        Co-evaluation               AM-PAC PT "6 Clicks" Mobility  Outcome Measure Help needed turning from your back to your side while in a flat bed without using bedrails?: None Help needed moving from lying on your back to sitting on the side of a flat bed without using bedrails?: None Help needed moving to and from a bed to a chair (including a wheelchair)?: None Help needed standing up from a chair using your arms (e.g., wheelchair or bedside chair)?: None Help needed to walk in hospital room?: None Help needed climbing 3-5 steps with a railing? : A Little 6 Click Score: 23    End  of Session Equipment Utilized During Treatment: Cervical collar Activity Tolerance: Patient tolerated treatment well Patient left: with call bell/phone within reach(Sitting EOB and getting dressed) Nurse Communication: Mobility status PT Visit Diagnosis: Unsteadiness on feet (R26.81);Pain Pain - part of body: (back)    Time: 1610-9604 PT Time Calculation (min) (ACUTE ONLY): 22 min   Charges:   PT Evaluation $PT Eval Moderate Complexity: 1 Mod          Rolinda Roan, PT, DPT Acute Rehabilitation Services Pager: [phone] Office: [phone]   Thelma Comp 09/25/2019, 1:37 PM

## 2019-09-29 ENCOUNTER — Encounter: Payer: Self-pay | Admitting: *Deleted

## 2020-04-22 ENCOUNTER — Emergency Department: Payer: Medicaid Other

## 2020-04-22 ENCOUNTER — Emergency Department
Admission: EM | Admit: 2020-04-22 | Discharge: 2020-04-23 | Disposition: A | Discharge status: Home / Self Care | Payer: Medicaid Other | Attending: Emergency Medicine | Admitting: Emergency Medicine

## 2020-04-22 ENCOUNTER — Other Ambulatory Visit: Payer: Self-pay

## 2020-04-22 ENCOUNTER — Encounter: Payer: Self-pay | Admitting: Emergency Medicine

## 2020-04-22 DIAGNOSIS — Z7984 Long term (current) use of oral hypoglycemic drugs: Secondary | ICD-10-CM | POA: Diagnosis not present

## 2020-04-22 DIAGNOSIS — F17210 Nicotine dependence, cigarettes, uncomplicated: Secondary | ICD-10-CM | POA: Diagnosis not present

## 2020-04-22 DIAGNOSIS — R4182 Altered mental status, unspecified: Secondary | ICD-10-CM | POA: Diagnosis not present

## 2020-04-22 DIAGNOSIS — I10 Essential (primary) hypertension: Secondary | ICD-10-CM | POA: Insufficient documentation

## 2020-04-22 DIAGNOSIS — F1290 Cannabis use, unspecified, uncomplicated: Secondary | ICD-10-CM | POA: Insufficient documentation

## 2020-04-22 DIAGNOSIS — Z79899 Other long term (current) drug therapy: Secondary | ICD-10-CM | POA: Insufficient documentation

## 2020-04-22 LAB — BASIC METABOLIC PANEL
Anion gap: 10 (ref 5–15)
BUN: 51 mg/dL — ABNORMAL HIGH (ref 6–20)
CO2: 24 mmol/L (ref 22–32)
Calcium: 9 mg/dL (ref 8.9–10.3)
Chloride: 101 mmol/L (ref 98–111)
Creatinine, Ser: 1.86 mg/dL — ABNORMAL HIGH (ref 0.61–1.24)
GFR calc Af Amer: 47 mL/min — ABNORMAL LOW (ref >60)
GFR calc non Af Amer: 41 mL/min — ABNORMAL LOW (ref >60)
Glucose, Bld: 105 mg/dL — ABNORMAL HIGH (ref 70–99)
Potassium: 4.3 mmol/L (ref 3.5–5.1)
Sodium: 135 mmol/L (ref 135–145)

## 2020-04-22 LAB — CBC
HCT: 41.6 % (ref 39.0–52.0)
Hemoglobin: 13.8 g/dL (ref 13.0–17.0)
MCH: 28 pg (ref 26.0–34.0)
MCHC: 33.2 g/dL (ref 30.0–36.0)
MCV: 84.6 fL (ref 80.0–100.0)
Platelets: 328 10*3/uL (ref 150–400)
RBC: 4.92 MIL/uL (ref 4.22–5.81)
RDW: 14.5 % (ref 11.5–15.5)
WBC: 9.5 10*3/uL (ref 4.0–10.5)
nRBC: 0 % (ref 0.0–0.2)

## 2020-04-22 LAB — URINE DRUG SCREEN, QUALITATIVE (ARMC ONLY)
Amphetamines, Ur Screen: NOT DETECTED
Barbiturates, Ur Screen: NOT DETECTED
Benzodiazepine, Ur Scrn: NOT DETECTED
Cannabinoid 50 Ng, Ur ~~LOC~~: POSITIVE — AB
Cocaine Metabolite,Ur ~~LOC~~: NOT DETECTED
MDMA (Ecstasy)Ur Screen: NOT DETECTED
Methadone Scn, Ur: NOT DETECTED
Opiate, Ur Screen: NOT DETECTED
Phencyclidine (PCP) Ur S: NOT DETECTED
Tricyclic, Ur Screen: NOT DETECTED

## 2020-04-22 LAB — GLUCOSE, CAPILLARY: Glucose-Capillary: 118 mg/dL — ABNORMAL HIGH (ref 70–99)

## 2020-04-22 LAB — ETHANOL: Alcohol, Ethyl (B): <10 mg/dL (ref <10)

## 2020-04-22 MED ORDER — SODIUM CHLORIDE 0.9 % IV BOLUS
1000.0000 mL | Freq: Once | INTRAVENOUS | Status: AC
  Administered 2020-04-22: 1000 mL via INTRAVENOUS

## 2020-04-22 NOTE — ED Provider Notes (Signed)
-----------------------------------------   11:24 PM on 04/22/2020 -----------------------------------------  Assuming care from Dr. Derrill Kay.  In short, Bryan Huffman is a 48 y.o. male with a chief complaint of AMS.  Refer to the original H&P for additional details.  The current plan of care is to observe.   ----------------------------------------- 3:09 AM on 04/23/2020 -----------------------------------------  Patient is awake, alert, fully cognizant, ambulatory without difficulty. He is asking for something to eat and for pain medicine. I offered Tylenol which she declined and said he just wants to go home. I provided discharge instructions as prepared by Dr. Derrill Kay.   Loleta Rose, MD 04/23/20 [phone]

## 2020-04-22 NOTE — ED Notes (Signed)
Mom out of room so pt could urinate.

## 2020-04-22 NOTE — ED Provider Notes (Signed)
[facility] Emergency Department Provider Note   ____________________________________________   I have reviewed the triage vital signs and the nursing notes.   HISTORY  Chief Complaint Altered Mental Status   History limited by and level 5 caveat due to: altered mental status  HPI Bryan Huffman is a 49 y.o. male who presents to the emergency department today because of concern for altered mental status. The patient was brought in by mother, and mother gives the history.  Apparently the patient had been stating that he not felt well throughout the day today.  He is working on Training and development officer some apartments and his boss noticed that he was not doing well.  He was told to go lie down on one of the empty apartments.  When the mom came to come pick him up they are having a hard time getting him up off the ground.  She noticed that his balance was off.  Patient himself is somewhat somnolent and cannot give a good history.  He states however he did take some of his pain medication today.  He denies any pain.  Records reviewed. Per medical record review patient has a history of substance abuse, HTN, chronic back pain.  Past Medical History:  Diagnosis Date  . Anxiety   . Arthritis   . Back pain   . Depression   . GERD (gastroesophageal reflux disease)   . Hemorrhoid   . Hypertension   . Insomnia   . Mental disorder   . Neuromuscular disorder (HCC)    left leg pain - 4 back surgeries  . OSA (obstructive sleep apnea)    on CPAP per notes    Patient Active Problem List   Diagnosis Date Noted  . Cervical disc herniation 09/24/2019  . Polysubstance abuse (HCC) 09/02/2014  . History of suicidal ideation 09/02/2014  . HTN (hypertension) 09/02/2014  . GERD (gastroesophageal reflux disease) 09/02/2014  . Cigarette smoker 09/02/2014  . 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 History of pancreatitis 09/02/2014  . DDD (degenerative disc disease), lumbar 10/15/2013   . Alcohol dependence (HCC) 07/26/2013  . Back pain 07/26/2013  . MDD (major depressive disorder) 07/26/2013    Past Surgical History:  Procedure Laterality Date  . ANTERIOR CERVICAL DECOMP/DISCECTOMY FUSION N/A 09/24/2019   Procedure: ANTERIOR CERVICAL DECOMPRESSION/DISCECTOMY FUSION C5-6;  Surgeon: Venita Lick, MD;  Location: [facility] OR;  Service: Orthopedics;  Laterality: N/A;  3.5 hrs  . ANTERIOR LAT LUMBAR FUSION N/A 01/29/2014   Procedure: X LIF L2-L3 WITH LATERAL PLATES -ANTERIOR LATERAL LUMBAR FUSION 1 LEVEL;  Surgeon: Venita Lick, MD;  Location: [facility] OR;  Service: Orthopedics;  Laterality: N/A;  . BACK SURGERY  1998,2008   L3 L4 L5 fusion  . I & D EXTREMITY Right 09/07/2014   Procedure: DEBRIDEMENT RIGHT HAND EXTENSOR TENOSYNOVECTOMY;  Surgeon: Jodi Marble, MD;  Location: [facility];  Service: Orthopedics;  Laterality: Right;  . I & D EXTREMITY Right 12/14/2014   Procedure: IRRIGATION AND DEBRIDEMENT EXTREMITY RIGHT HAND;  Surgeon: Mack Hook, MD;  Location: [facility];  Service: Orthopedics;  Laterality: Right;  RRIGATION AND DEBRIDEMENT EXTREMITY RIGHT HAND  . LUMBAR LAMINECTOMY/DECOMPRESSION MICRODISCECTOMY Left 10/15/2013   Procedure: LUMBAR 2-3 LEFT DISCECTOMY;  Surgeon: Venita Lick, MD;  Location: [facility] OR;  Service: Orthopedics;  Laterality: Left;  . LUMBAR SPINE SURGERY  10/15/2013   L 2 L3  DISECTOMY  . SPINE SURGERY    . TONSILLECTOMY      Prior  to Admission medications   Medication Sig Start Date End Date Taking? Authorizing Provider  baclofen (LIORESAL) 10 MG tablet Take 10 mg by mouth 2 (two) times daily. 08/28/19   [provider]  chlorthalidone (HYGROTON) 25 MG tablet Take 12.5 mg by mouth daily. 09/09/19   [provider]  dexlansoprazole (DEXILANT) 60 MG capsule Take 60 mg by mouth daily.     [provider]  lisinopril (ZESTRIL) 10 MG tablet Take 2 tablets (20 mg total) by mouth daily. Patient taking  differently: Take 10 mg by mouth daily.  07/21/19   Linwood Dibbles, MD  metFORMIN (GLUCOPHAGE) 500 MG tablet Take 500 mg by mouth at bedtime. 08/27/19   [provider]  metoprolol succinate (TOPROL-XL) 100 MG 24 hr tablet Take 1 tablet (100 mg total) by mouth daily. Take with or immediately following a meal. 11/21/13   Withrow, Everardo All, FNP  ondansetron (ZOFRAN) 4 MG tablet Take 1 tablet (4 mg total) by mouth every 8 (eight) hours as needed for nausea or vomiting. 09/24/19   Venita Lick, MD  oxyCODONE-acetaminophen (PERCOCET) 7.5-325 MG tablet Take 1 tablet by mouth 4 (four) times daily.  09/09/19   [provider]  phentermine 15 MG capsule Take 15 mg by mouth daily. 08/27/19   [provider]  pregabalin (LYRICA) 75 MG capsule Take 75 mg by mouth 3 (three) times daily. 09/09/19   [provider]  simvastatin (ZOCOR) 10 MG tablet Take 10 mg by mouth daily.     [provider]  TRINTELLIX 10 MG TABS tablet Take 10 mg by mouth at bedtime.  07/08/19   [provider]    Allergies Bee venom  Family History  Problem Relation Age of Onset  . Hyperlipidemia Father   . Heart disease Father   . Diabetes Father     Social History Social History   Tobacco Use  . Smoking status: Current Some Day Smoker    Packs/day: 0.50    Years: 18.00    Pack years: 9.00    Types: Cigarettes  . Smokeless tobacco: Never Used  . Tobacco comment: cutting back  Vaping Use  . Vaping Use: Never used  Substance Use Topics  . Alcohol use: Yes  . Drug use: Yes    Types: Marijuana, Oxycodone, "Crack" cocaine    Review of Systems ROS limited secondary to AMS Constitutional: No fever/chills Cardiovascular: Denies chest pain. Respiratory: Denies shortness of breath. Gastrointestinal: No abdominal pain.  No nausea, no vomiting.  No diarrhea.   Genitourinary: Negative for dysuria. Musculoskeletal: Negative for back pain. Skin: Negative for rash. Neurological:  Negative for headaches, focal weakness or numbness.  ____________________________________________   PHYSICAL EXAM:  VITAL SIGNS: ED Triage Vitals  Enc Vitals Group     BP 04/22/20 1952 (!) 145/83     Pulse Rate 04/22/20 1952 (!) 56     Resp 04/22/20 1952 (!) 24     Temp 04/22/20 1952 97.8 F (36.6 C)     Temp Source 04/22/20 1952 Oral     SpO2 04/22/20 1952 100 %     Weight 04/22/20 1953 240 lb (108.9 kg)     Height 04/22/20 1953 5\' 8"  (1.727 m)     Head Circumference --      Peak Flow --      Pain Score 04/22/20 1953 0   Constitutional: Somnolent, will awaken to verbal stimuli. Eyes: Conjunctivae are normal.  ENT      Head: Normocephalic and  atraumatic.      Nose: No congestion/rhinnorhea.      Mouth/Throat: Mucous membranes are moist.      Neck: No stridor. Hematological/Lymphatic/Immunilogical: No cervical lymphadenopathy. Cardiovascular: Normal rate, regular rhythm.  No murmurs, rubs, or gallops.  Respiratory: Normal respiratory effort without tachypnea nor retractions. Breath sounds are clear and equal bilaterally. No wheezes/rales/rhonchi. Gastrointestinal: Soft and non tender. No rebound. No guarding.  Genitourinary: Deferred Musculoskeletal: Normal range of motion in all extremities. No lower extremity edema. Neurologic:  Somnolent. Awakens to verbals stimuli, is able to answer basic yes/no questions before falling asleep again. Appears to move all extremities when awake.  Skin:  Skin is warm, dry and intact. No rash noted. ____________________________________________    LABS (pertinent positives/negatives)  Ethanol <10 BMP wnl except glu 105, bun 51, cr 1.86 CBC wbc 9.5, hgb 13.8, plt 328 UDS positive cannabinoid  ____________________________________________   EKG  I, Phineas Semen, attending physician, personally viewed and interpreted this EKG  EKG Time: 1952 Rate: 55 Rhythm: sinus bradycardia Axis: normal Intervals: qtc 426 QRS: narrow ST  changes: no st elevation Impression: abnormal ekg  ____________________________________________    RADIOLOGY  CT head No concerning findings  ____________________________________________   PROCEDURES  Procedures  ____________________________________________   INITIAL IMPRESSION / ASSESSMENT AND PLAN / ED COURSE  Pertinent labs & imaging results that were available during my care of the patient were reviewed by me and considered in my medical decision making (see chart for details).   Patient presented to the emergency department today because of concerns for altered mental status.  Patient's initial exam was somewhat odd and that he was somnolent however would easily awaken to verbal stimuli and then fall asleep again.  Broad work-up was initiated for altered mental status causes.  Head CT was obtained without any concerning findings.  Blood work also without any significant findings save for possible mild elevation of creatinine.  Patient had been given a liter of fluid.  The patient was positive for marijuana.  On repeat exam patient was sleeping however did awaken with physical stimuli.  He does now appear to be somewhat more lucid.  He seemed to be more awake and aware of his surroundings.  At this time I do think patient's altered mental status likely due to substance use. Will plan on continued observation for sobriety.   ____________________________________________   FINAL CLINICAL IMPRESSION(S) / ED DIAGNOSES  Final diagnoses:  Altered mental status, unspecified altered mental status type  Marijuana use     Note: This dictation was prepared with Dragon dictation. Any transcriptional errors that result from this process are unintentional     Phineas Semen, MD 04/22/20 2311

## 2020-04-22 NOTE — ED Notes (Signed)
This RN in to obtain blood and urine. Unable to obtain blood sample. Pt unwilling to urinate into urinal at the bedside. Pt yelling and states he is going to walk to the bathroom or he is not going to urinate. Multiple attempts by this RN and pt own mother that he is altered and unable to walk on his own or stay awake and have a conversation. Pt denies and states he is fine. Advised pt we can do a catheter or a pure wick if he did not want to urinate in the urinal at the bedside. Pt continues to argue with staff and mother. Pt stood up and stumbled before completing 2 stepa and grabbed the computer on the wall. Pt placed back in bed. Pt continues to deny all of the above. Pt finally urinates in the urinal as mom stepped out. Pt fell asleep while using the urinal leaned forward and dropped the urinal into his pants. Pt became upset when this RN kept him from falling in the floor and took the urinal away to prevent it from spilling over his legs and his shorts. Pt placed back in bed. Mom back at bedside. Mom advises pt that his behavior is not appropriate. Pt continues.

## 2020-04-22 NOTE — ED Triage Notes (Addendum)
Pt in via POV, assisted out of car, pt lethargic, disoriented, difficulty getting information from him.  Pt roomed at this time.  EDP, Derrill Kay called to bedside.

## 2020-04-22 NOTE — ED Notes (Signed)
Patient is resting comfortably. Pt has snoring respirations. Mom at bedside.

## 2020-04-22 NOTE — Discharge Instructions (Addendum)
Please seek medical attention for any high fevers, chest pain, shortness of breath, change in behavior, persistent vomiting, bloody stool or any other new or concerning symptoms.  

## 2020-04-23 NOTE — ED Notes (Signed)
Pt up, walking around room and out into hallway. Pt with steady gait, able to answer all questions. Pt requesting we call for his ride. Pts ride contacted and in route to pick pt up in ED WR.

## 2020-04-23 NOTE — ED Notes (Signed)
Pt refused tylenol. Pt provided w/ happy meal and a ginger ale. Pt states he wants to go home.

## 2020-09-27 ENCOUNTER — Ambulatory Visit: Payer: Self-pay | Admitting: Student

## 2020-10-04 ENCOUNTER — Ambulatory Visit: Payer: Self-pay | Admitting: Student

## 2020-10-04 NOTE — H&P (Signed)
TOTAL HIP ADMISSION H&P  Patient is admitted for right total hip arthroplasty.  Subjective:  Chief Complaint: right hip pain  HPI: Bryan Huffman, 49 y.o. male, has a history of pain and functional disability in the right hip(s) due to arthritis and patient has failed non-surgical conservative treatments for greater than 12 weeks to include NSAID's and/or analgesics and corticosteriod injections.  Onset of symptoms was gradual starting 2 years ago with gradually worsening course since that time.The patient noted no past surgery on the right hip(s).  Patient currently rates pain in the right hip at 8 out of 10 with activity. Patient has worsening of pain with activity and weight bearing, pain that interfers with activities of daily living and pain with passive range of motion. Patient has evidence of subchondral cysts and joint space narrowing by imaging studies. This condition presents safety issues increasing the risk of falls. There is no current active infection.  Patient Active Problem List   Diagnosis Date Noted  . Cervical disc herniation 09/24/2019  . Polysubstance abuse (HCC) 09/02/2014  . History of suicidal ideation 09/02/2014  . HTN (hypertension) 09/02/2014  . GERD (gastroesophageal reflux disease) 09/02/2014  . Cigarette smoker 09/02/2014  . 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 History of pancreatitis 09/02/2014  . DDD (degenerative disc disease), lumbar 10/15/2013  . Alcohol dependence (HCC) 07/26/2013  . Back pain 07/26/2013  . MDD (major depressive disorder) 07/26/2013   Past Medical History:  Diagnosis Date  . Anxiety   . Arthritis   . Back pain   . Depression   . GERD (gastroesophageal reflux disease)   . Hemorrhoid   . Hypertension   . Insomnia   . Mental disorder   . Neuromuscular disorder (HCC)    left leg pain - 4 back surgeries  . OSA (obstructive sleep apnea)    on CPAP per notes    Past Surgical History:  Procedure Laterality Date  . ANTERIOR  CERVICAL DECOMP/DISCECTOMY FUSION N/A 09/24/2019   Procedure: ANTERIOR CERVICAL DECOMPRESSION/DISCECTOMY FUSION C5-6;  Surgeon: Venita Lick, MD;  Location: [facility] OR;  Service: Orthopedics;  Laterality: N/A;  3.5 hrs  . ANTERIOR LAT LUMBAR FUSION N/A 01/29/2014   Procedure: X LIF L2-L3 WITH LATERAL PLATES -ANTERIOR LATERAL LUMBAR FUSION 1 LEVEL;  Surgeon: Venita Lick, MD;  Location: [facility] OR;  Service: Orthopedics;  Laterality: N/A;  . BACK SURGERY  1998,2008   L3 L4 L5 fusion  . I & D EXTREMITY Right 09/07/2014   Procedure: DEBRIDEMENT RIGHT HAND EXTENSOR TENOSYNOVECTOMY;  Surgeon: Jodi Marble, MD;  Location: [facility];  Service: Orthopedics;  Laterality: Right;  . I & D EXTREMITY Right 12/14/2014   Procedure: IRRIGATION AND DEBRIDEMENT EXTREMITY RIGHT HAND;  Surgeon: Mack Hook, MD;  Location: [facility];  Service: Orthopedics;  Laterality: Right;  RRIGATION AND DEBRIDEMENT EXTREMITY RIGHT HAND  . LUMBAR LAMINECTOMY/DECOMPRESSION MICRODISCECTOMY Left 10/15/2013   Procedure: LUMBAR 2-3 LEFT DISCECTOMY;  Surgeon: Venita Lick, MD;  Location: [facility] OR;  Service: Orthopedics;  Laterality: Left;  . LUMBAR SPINE SURGERY  10/15/2013   L 2 L3  DISECTOMY  . SPINE SURGERY    . TONSILLECTOMY      Current Outpatient Medications  Medication Sig Dispense Refill Last Dose  . albuterol (VENTOLIN HFA) 108 (90 Base) MCG/ACT inhaler Inhale 2 puffs into the lungs every 6 (six) hours as needed for wheezing or shortness of breath.     . APPLE CIDER VINEGAR PO Take 4 capsules by  mouth daily.     . baclofen (LIORESAL) 20 MG tablet Take 20 mg by mouth 2 (two) times daily.     . Buprenorphine HCl (BELBUCA) 900 MCG FILM Place 900 mcg inside cheek 2 (two) times daily.     Marland Kitchen dexlansoprazole (DEXILANT) 60 MG capsule Take 60 mg by mouth daily.      . ergocalciferol (VITAMIN D2) 1.25 MG (50000 UT) capsule Take 50,000 Units by mouth every Monday.     Marland Kitchen ibuprofen (ADVIL) 200 MG tablet Take  800 mg by mouth 3 (three) times daily as needed for moderate pain.     Bess Harvest Ethyl (VASCEPA) 0.5 g CAPS Take 1 g by mouth 2 (two) times daily.     Marland Kitchen lisinopril (ZESTRIL) 10 MG tablet Take 2 tablets (20 mg total) by mouth daily. (Patient taking differently: Take 10 mg by mouth daily.) 60 tablet 0   . metoprolol succinate (TOPROL-XL) 100 MG 24 hr tablet Take 1 tablet (100 mg total) by mouth daily. Take with or immediately following a meal. 30 tablet 0   . oxyCODONE-acetaminophen (PERCOCET) 10-325 MG tablet Take 1 tablet by mouth 4 (four) times daily.     Marland Kitchen oxymetazoline (AFRIN) 0.05 % nasal spray Place 3 sprays into both nostrils at bedtime as needed for congestion.     . pregabalin (LYRICA) 150 MG capsule Take 150 mg by mouth 2 (two) times daily.      No current facility-administered medications for this visit.   Allergies  Allergen Reactions  . Bee Venom Anaphylaxis and Swelling  . Chlorthalidone     Dehydrated/sweating/ passed out  . Statins     Muscle pain    Social History   Tobacco Use  . Smoking status: Current Some Day Smoker    Packs/day: 0.50    Years: 18.00    Pack years: 9.00    Types: Cigarettes  . Smokeless tobacco: Never Used  . Tobacco comment: cutting back  Substance Use Topics  . Alcohol use: Yes    Family History  Problem Relation Age of Onset  . Hyperlipidemia Father   . Heart disease Father   . Diabetes Father      Review of Systems  Constitutional: Negative.   HENT: Negative.   Eyes: Negative.   Respiratory: Negative.   Cardiovascular: Negative.   Gastrointestinal: Negative.   Endocrine: Negative.   Genitourinary: Negative.   Musculoskeletal: Positive for arthralgias, back pain and gait problem.  Skin: Negative.   Allergic/Immunologic: Negative.   Hematological: Negative.   Psychiatric/Behavioral: Negative.     Objective:  Physical Exam HENT:     Head: Normocephalic.  Eyes:     Pupils: Pupils are equal, round, and reactive to  light.  Cardiovascular:     Rate and Rhythm: Normal rate and regular rhythm.     Pulses: Normal pulses.     Heart sounds: Normal heart sounds.  Pulmonary:     Breath sounds: Normal breath sounds.  Abdominal:     Palpations: Abdomen is soft.     Tenderness: There is no abdominal tenderness.  Genitourinary:    Comments: Deferred Musculoskeletal:     Comments: Examination of left hip reveals no skin wounds or lesions. Mild trochanteric tenderness to palpation. Moderately restricted range of motion of the hip. Moderate pain with terminal flexion and rotation.  Skin:    General: Skin is warm and dry.  Neurological:     Mental Status: He is alert and oriented to person, place, and  time.     Vital signs in last 24 hours: @VSRANGES @  Labs:   Estimated body mass index is 36.49 kg/m as calculated from the following:   Height as of 04/22/20: 5\' 8"  (1.727 m).   Weight as of 04/22/20: 108.9 kg.   Imaging Review Plain radiographs demonstrate severe degenerative joint disease of the right hip(s). The bone quality appears to be adequate for age and reported activity level.      Assessment/Plan:  End stage arthritis, right hip(s)  The patient history, physical examination, clinical judgement of the provider and imaging studies are consistent with end stage degenerative joint disease of the right hip(s) and total hip arthroplasty is deemed medically necessary. The treatment options including medical management, injection therapy, arthroscopy and arthroplasty were discussed at length. The risks and benefits of total hip arthroplasty were presented and reviewed. The risks due to aseptic loosening, infection, stiffness, dislocation/subluxation,  thromboembolic complications and other imponderables were discussed.  The patient acknowledged the explanation, agreed to proceed with the plan and consent was signed. Patient is being admitted for inpatient treatment for surgery, pain control, PT, OT,  prophylactic antibiotics, VTE prophylaxis, progressive ambulation and ADL's and discharge planning.The patient is planning to be discharged home.

## 2020-10-04 NOTE — Progress Notes (Addendum)
PCP - [facility] Cardiologist - no  PPM/ICD -  Device Orders -  Rep Notified -   Chest x-ray -  EKG - 04-22-20 epic Stress Test - 08-04-19 epic ECHO - 07-31-19 epic Cardiac Cath -   Sleep Study -  CPAP - yes  Fasting Blood Sugar -  Checks Blood Sugar _____ times a day  Blood Thinner Instructions: Aspirin Instructions:  ERAS Protcol - PRE-SURGERY Ensure  COVID TEST- 10-11-20  Activity--Able to walk to mailbox without SOB limited due to hip pain Anesthesia review: [place],  OSA, HTN  Patient denies shortness of breath, fever, cough and chest pain at PAT appointment  none   All instructions explained to the patient, with a verbal understanding of the material. Patient agrees to go over the instructions while at home for a better understanding. Patient also instructed to self quarantine after being tested for COVID-19. The opportunity to ask questions was provided.

## 2020-10-04 NOTE — Patient Instructions (Addendum)
DUE TO COVID-19 ONLY ONE VISITOR IS ALLOWED TO COME WITH YOU AND STAY IN THE WAITING ROOM ONLY DURING PRE OP AND PROCEDURE DAY OF SURGERY. THE 1 VISITOR  MAY VISIT WITH YOU AFTER SURGERY IN YOUR PRIVATE ROOM DURING VISITING HOURS ONLY!  YOU NEED TO HAVE A COVID 19 TEST ON__1-3-22_____ @_11 :10 am______, THIS TEST MUST BE DONE BEFORE SURGERY,  COVID TESTING SITE [address] [place] , IT IS ON THE RIGHT GOING OUT [address] APPROXIMATELY  2 MINUTES PAST [employer] ON THE RIGHT. ONCE YOUR COVID TEST IS COMPLETED,  PLEASE BEGIN THE QUARANTINE INSTRUCTIONS AS OUTLINED IN YOUR HANDOUT.                Bryan Huffman  10/04/2020   Your procedure is scheduled on: 10-14-20   Report to [facility] Main  Entrance   Report to short stay  at        0530   AM    Bring cpap mask and tubing only     Call this number if you have problems the morning of surgery [phone]    Remember: NO SOLID FOOD AFTER MIDNIGHT THE NIGHT PRIOR TO SURGERY. NOTHING BY MOUTH EXCEPT CLEAR LIQUIDS UNTIL     0430 am . PLEASE FINISH ENSURE DRINK PER SURGEON ORDER  WHICH NEEDS TO BE COMPLETED AT      0430 am then nothing by mouth .    CLEAR LIQUID DIET   Foods Allowed                                                                                          Foods Excluded water Black Coffee and tea, regular and decaf                                                   liquids that you cannot  Plain Jell-O any favor except red or purple                                           see through such as: Fruit ices (not with fruit pulp)                                                                       milk, soups, orange juice  Iced Popsicles  All solid food Carbonated beverages, regular and diet                                    Cranberry, grape and apple juices Sports drinks like Gatorade Lightly seasoned clear broth  or consume(fat free) Sugar, honey syrup      BRUSH YOUR TEETH MORNING OF SURGERY AND RINSE YOUR MOUTH OUT, NO CHEWING GUM CANDY OR MINTS.     Take these medicines the morning of surgery with A SIP OF WATER: Lyrica, percocet, metoprolol, Vascepa, Dexilant, baclofen, inhaler and bring it with you                                 You may not have any metal on your body including hair pins and              piercings  Do not wear jewelry, , lotions, powders or perfumes, deodorant                     Men may shave face and neck.   Do not bring valuables to the hospital. [facility] IS NOT             RESPONSIBLE   FOR VALUABLES.  Contacts, dentures or bridgework may not be worn into surgery.      Patients discharged the day of surgery will not be allowed to drive home. IF YOU ARE HAVING SURGERY AND GOING HOME THE SAME DAY, YOU MUST HAVE AN ADULT TO DRIVE YOU HOME AND BE WITH YOU FOR 24 HOURS. YOU MAY GO HOME BY TAXI OR UBER OR ORTHERWISE, BUT AN ADULT MUST ACCOMPANY YOU HOME AND STAY WITH YOU FOR 24 HOURS.  Name and phone number of your driver:  Special Instructions: N/A              Please read over the following fact sheets you were given: _____________________________________________________________________             [facility] - Preparing for Surgery Before surgery, you can play an important role.  Because skin is not sterile, your skin needs to be as free of germs as possible.  You can reduce the number of germs on your skin by washing with CHG (chlorahexidine gluconate) soap before surgery.  CHG is an antiseptic cleaner which kills germs and bonds with the skin to continue killing germs even after washing. Please DO NOT use if you have an allergy to CHG or antibacterial soaps.  If your skin becomes reddened/irritated stop using the CHG and inform your nurse when you arrive at Short Stay. Do not shave (including legs and underarms) for at least 48 hours prior to the first CHG  shower.  You may shave your face/neck. Please follow these instructions carefully:  1.  Shower with CHG Soap the night before surgery and the  morning of Surgery.  2.  If you choose to wash your hair, wash your hair first as usual with your  normal  shampoo.  3.  After you shampoo, rinse your hair and body thoroughly to remove the  shampoo.                           4.  Use CHG as you would any other liquid soap.  You can apply  chg directly  to the skin and wash                       Gently with a scrungie or clean washcloth.  5.  Apply the CHG Soap to your body ONLY FROM THE NECK DOWN.   Do not use on face/ open                           Wound or open sores. Avoid contact with eyes, ears mouth and genitals (private parts).                       Wash face,  Genitals (private parts) with your normal soap.             6.  Wash thoroughly, paying special attention to the area where your surgery  will be performed.  7.  Thoroughly rinse your body with warm water from the neck down.  8.  DO NOT shower/wash with your normal soap after using and rinsing off  the CHG Soap.                9.  Pat yourself dry with a clean towel.            10.  Wear clean pajamas.            11.  Place clean sheets on your bed the night of your first shower and do not  sleep with pets. Day of Surgery : Do not apply any lotions/deodorants the morning of surgery.  Please wear clean clothes to the hospital/surgery center.  FAILURE TO FOLLOW THESE INSTRUCTIONS MAY RESULT IN THE CANCELLATION OF YOUR SURGERY PATIENT SIGNATURE_________________________________  NURSE SIGNATURE__________________________________  ________________________________________________________________________   Bryan MireIncentive Spirometer  An incentive spirometer is a tool that can help keep your lungs clear and active. This tool measures how well you are filling your lungs with each breath. Taking long deep breaths may help reverse or decrease the chance  of developing breathing (pulmonary) problems (especially infection) following:  A long period of time when you are unable to move or be active. BEFORE THE PROCEDURE   If the spirometer includes an indicator to show your best effort, your nurse or respiratory therapist will set it to a desired goal.  If possible, sit up straight or lean slightly forward. Try not to slouch.  Hold the incentive spirometer in an upright position. INSTRUCTIONS FOR USE  1. Sit on the edge of your bed if possible, or sit up as far as you can in bed or on a chair. 2. Hold the incentive spirometer in an upright position. 3. Breathe out normally. 4. Place the mouthpiece in your mouth and seal your lips tightly around it. 5. Breathe in slowly and as deeply as possible, raising the piston or the ball toward the top of the column. 6. Hold your breath for 3-5 seconds or for as long as possible. Allow the piston or ball to fall to the bottom of the column. 7. Remove the mouthpiece from your mouth and breathe out normally. 8. Rest for a few seconds and repeat Steps 1 through 7 at least 10 times every 1-2 hours when you are awake. Take your time and take a few normal breaths between deep breaths. 9. The spirometer may include an indicator to show your best effort. Use the indicator as a goal to work toward during each repetition.  10. After each set of 10 deep breaths, practice coughing to be sure your lungs are clear. If you have an incision (the cut made at the time of surgery), support your incision when coughing by placing a pillow or rolled up towels firmly against it. Once you are able to get out of bed, walk around indoors and cough well. You may stop using the incentive spirometer when instructed by your caregiver.  RISKS AND COMPLICATIONS  Take your time so you do not get dizzy or light-headed.  If you are in pain, you may need to take or ask for pain medication before doing incentive spirometry. It is harder to  take a deep breath if you are having pain. AFTER USE  Rest and breathe slowly and easily.  It can be helpful to keep track of a log of your progress. Your caregiver can provide you with a simple table to help with this. If you are using the spirometer at home, follow these instructions: SEEK MEDICAL CARE IF:   You are having difficultly using the spirometer.  You have trouble using the spirometer as often as instructed.  Your pain medication is not giving enough relief while using the spirometer.  You develop fever of 100.5 F (38.1 C) or higher. SEEK IMMEDIATE MEDICAL CARE IF:   You cough up bloody sputum that had not been present before.  You develop fever of 102 F (38.9 C) or greater.  You develop worsening pain at or near the incision site. MAKE SURE YOU:   Understand these instructions.  Will watch your condition.  Will get help right away if you are not doing well or get worse. Document Released: 02/05/2007 Document Revised: 12/18/2011 Document Reviewed: 04/08/2007 [facility] Patient Information 2014 [place], [place].   ________________________________________________________________________

## 2020-10-04 NOTE — H&P (View-Only) (Signed)
TOTAL HIP ADMISSION H&P  Patient is admitted for right total hip arthroplasty.  Subjective:  Chief Complaint: right hip pain  HPI: Bryan Huffman, 49 y.o. male, has a history of pain and functional disability in the right hip(s) due to arthritis and patient has failed non-surgical conservative treatments for greater than 12 weeks to include NSAID's and/or analgesics and corticosteriod injections.  Onset of symptoms was gradual starting 2 years ago with gradually worsening course since that time.The patient noted no past surgery on the right hip(s).  Patient currently rates pain in the right hip at 8 out of 10 with activity. Patient has worsening of pain with activity and weight bearing, pain that interfers with activities of daily living and pain with passive range of motion. Patient has evidence of subchondral cysts and joint space narrowing by imaging studies. This condition presents safety issues increasing the risk of falls. There is no current active infection.  Patient Active Problem List   Diagnosis Date Noted  . Cervical disc herniation 09/24/2019  . Polysubstance abuse (HCC) 09/02/2014  . History of suicidal ideation 09/02/2014  . HTN (hypertension) 09/02/2014  . GERD (gastroesophageal reflux disease) 09/02/2014  . Cigarette smoker 09/02/2014  . 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 History of pancreatitis 09/02/2014  . DDD (degenerative disc disease), lumbar 10/15/2013  . Alcohol dependence (HCC) 07/26/2013  . Back pain 07/26/2013  . MDD (major depressive disorder) 07/26/2013   Past Medical History:  Diagnosis Date  . Anxiety   . Arthritis   . Back pain   . Depression   . GERD (gastroesophageal reflux disease)   . Hemorrhoid   . Hypertension   . Insomnia   . Mental disorder   . Neuromuscular disorder (HCC)    left leg pain - 4 back surgeries  . OSA (obstructive sleep apnea)    on CPAP per notes    Past Surgical History:  Procedure Laterality Date  . ANTERIOR  CERVICAL DECOMP/DISCECTOMY FUSION N/A 09/24/2019   Procedure: ANTERIOR CERVICAL DECOMPRESSION/DISCECTOMY FUSION C5-6;  Surgeon: Venita Lick, MD;  Location: [facility] OR;  Service: Orthopedics;  Laterality: N/A;  3.5 hrs  . ANTERIOR LAT LUMBAR FUSION N/A 01/29/2014   Procedure: X LIF L2-L3 WITH LATERAL PLATES -ANTERIOR LATERAL LUMBAR FUSION 1 LEVEL;  Surgeon: Venita Lick, MD;  Location: [facility] OR;  Service: Orthopedics;  Laterality: N/A;  . BACK SURGERY  1998,2008   L3 L4 L5 fusion  . I & D EXTREMITY Right 09/07/2014   Procedure: DEBRIDEMENT RIGHT HAND EXTENSOR TENOSYNOVECTOMY;  Surgeon: Jodi Marble, MD;  Location: [facility];  Service: Orthopedics;  Laterality: Right;  . I & D EXTREMITY Right 12/14/2014   Procedure: IRRIGATION AND DEBRIDEMENT EXTREMITY RIGHT HAND;  Surgeon: Mack Hook, MD;  Location: [facility];  Service: Orthopedics;  Laterality: Right;  RRIGATION AND DEBRIDEMENT EXTREMITY RIGHT HAND  . LUMBAR LAMINECTOMY/DECOMPRESSION MICRODISCECTOMY Left 10/15/2013   Procedure: LUMBAR 2-3 LEFT DISCECTOMY;  Surgeon: Venita Lick, MD;  Location: [facility] OR;  Service: Orthopedics;  Laterality: Left;  . LUMBAR SPINE SURGERY  10/15/2013   L 2 L3  DISECTOMY  . SPINE SURGERY    . TONSILLECTOMY      Current Outpatient Medications  Medication Sig Dispense Refill Last Dose  . albuterol (VENTOLIN HFA) 108 (90 Base) MCG/ACT inhaler Inhale 2 puffs into the lungs every 6 (six) hours as needed for wheezing or shortness of breath.     . APPLE CIDER VINEGAR PO Take 4 capsules by  mouth daily.     . baclofen (LIORESAL) 20 MG tablet Take 20 mg by mouth 2 (two) times daily.     . Buprenorphine HCl (BELBUCA) 900 MCG FILM Place 900 mcg inside cheek 2 (two) times daily.     Marland Kitchen dexlansoprazole (DEXILANT) 60 MG capsule Take 60 mg by mouth daily.      . ergocalciferol (VITAMIN D2) 1.25 MG (50000 UT) capsule Take 50,000 Units by mouth every Monday.     Marland Kitchen ibuprofen (ADVIL) 200 MG tablet Take  800 mg by mouth 3 (three) times daily as needed for moderate pain.     Bess Harvest Ethyl (VASCEPA) 0.5 g CAPS Take 1 g by mouth 2 (two) times daily.     Marland Kitchen lisinopril (ZESTRIL) 10 MG tablet Take 2 tablets (20 mg total) by mouth daily. (Patient taking differently: Take 10 mg by mouth daily.) 60 tablet 0   . metoprolol succinate (TOPROL-XL) 100 MG 24 hr tablet Take 1 tablet (100 mg total) by mouth daily. Take with or immediately following a meal. 30 tablet 0   . oxyCODONE-acetaminophen (PERCOCET) 10-325 MG tablet Take 1 tablet by mouth 4 (four) times daily.     Marland Kitchen oxymetazoline (AFRIN) 0.05 % nasal spray Place 3 sprays into both nostrils at bedtime as needed for congestion.     . pregabalin (LYRICA) 150 MG capsule Take 150 mg by mouth 2 (two) times daily.      No current facility-administered medications for this visit.   Allergies  Allergen Reactions  . Bee Venom Anaphylaxis and Swelling  . Chlorthalidone     Dehydrated/sweating/ passed out  . Statins     Muscle pain    Social History   Tobacco Use  . Smoking status: Current Some Day Smoker    Packs/day: 0.50    Years: 18.00    Pack years: 9.00    Types: Cigarettes  . Smokeless tobacco: Never Used  . Tobacco comment: cutting back  Substance Use Topics  . Alcohol use: Yes    Family History  Problem Relation Age of Onset  . Hyperlipidemia Father   . Heart disease Father   . Diabetes Father      Review of Systems  Constitutional: Negative.   HENT: Negative.   Eyes: Negative.   Respiratory: Negative.   Cardiovascular: Negative.   Gastrointestinal: Negative.   Endocrine: Negative.   Genitourinary: Negative.   Musculoskeletal: Positive for arthralgias, back pain and gait problem.  Skin: Negative.   Allergic/Immunologic: Negative.   Hematological: Negative.   Psychiatric/Behavioral: Negative.     Objective:  Physical Exam HENT:     Head: Normocephalic.  Eyes:     Pupils: Pupils are equal, round, and reactive to  light.  Cardiovascular:     Rate and Rhythm: Normal rate and regular rhythm.     Pulses: Normal pulses.     Heart sounds: Normal heart sounds.  Pulmonary:     Breath sounds: Normal breath sounds.  Abdominal:     Palpations: Abdomen is soft.     Tenderness: There is no abdominal tenderness.  Genitourinary:    Comments: Deferred Musculoskeletal:     Comments: Examination of left hip reveals no skin wounds or lesions. Mild trochanteric tenderness to palpation. Moderately restricted range of motion of the hip. Moderate pain with terminal flexion and rotation.  Skin:    General: Skin is warm and dry.  Neurological:     Mental Status: He is alert and oriented to person, place, and  time.     Vital signs in last 24 hours: @VSRANGES @  Labs:   Estimated body mass index is 36.49 kg/m as calculated from the following:   Height as of 04/22/20: 5\' 8"  (1.727 m).   Weight as of 04/22/20: 108.9 kg.   Imaging Review Plain radiographs demonstrate severe degenerative joint disease of the right hip(s). The bone quality appears to be adequate for age and reported activity level.      Assessment/Plan:  End stage arthritis, right hip(s)  The patient history, physical examination, clinical judgement of the provider and imaging studies are consistent with end stage degenerative joint disease of the right hip(s) and total hip arthroplasty is deemed medically necessary. The treatment options including medical management, injection therapy, arthroscopy and arthroplasty were discussed at length. The risks and benefits of total hip arthroplasty were presented and reviewed. The risks due to aseptic loosening, infection, stiffness, dislocation/subluxation,  thromboembolic complications and other imponderables were discussed.  The patient acknowledged the explanation, agreed to proceed with the plan and consent was signed. Patient is being admitted for inpatient treatment for surgery, pain control, PT, OT,  prophylactic antibiotics, VTE prophylaxis, progressive ambulation and ADL's and discharge planning.The patient is planning to be discharged home.

## 2020-10-05 ENCOUNTER — Other Ambulatory Visit: Payer: Self-pay

## 2020-10-05 ENCOUNTER — Encounter (HOSPITAL_COMMUNITY)
Admission: RE | Admit: 2020-10-05 | Discharge: 2020-10-05 | Disposition: A | Discharge status: Home / Self Care | Payer: Medicaid Other | Source: Ambulatory Visit | Attending: Orthopedic Surgery | Admitting: Orthopedic Surgery

## 2020-10-05 ENCOUNTER — Encounter (HOSPITAL_COMMUNITY): Payer: Self-pay

## 2020-10-05 DIAGNOSIS — Z01812 Encounter for preprocedural laboratory examination: Secondary | ICD-10-CM | POA: Diagnosis present

## 2020-10-05 LAB — URINALYSIS, ROUTINE W REFLEX MICROSCOPIC
Bilirubin Urine: NEGATIVE
Glucose, UA: NEGATIVE mg/dL
Hgb urine dipstick: NEGATIVE
Ketones, ur: NEGATIVE mg/dL
Leukocytes,Ua: NEGATIVE
Nitrite: NEGATIVE
Protein, ur: NEGATIVE mg/dL
Specific Gravity, Urine: 1.02 (ref 1.005–1.030)
pH: 5 (ref 5.0–8.0)

## 2020-10-05 LAB — CBC
HCT: 41.5 % (ref 39.0–52.0)
Hemoglobin: 13.5 g/dL (ref 13.0–17.0)
MCH: 28.3 pg (ref 26.0–34.0)
MCHC: 32.5 g/dL (ref 30.0–36.0)
MCV: 87 fL (ref 80.0–100.0)
Platelets: 259 10*3/uL (ref 150–400)
RBC: 4.77 MIL/uL (ref 4.22–5.81)
RDW: 16 % — ABNORMAL HIGH (ref 11.5–15.5)
WBC: 7.4 10*3/uL (ref 4.0–10.5)
nRBC: 0 % (ref 0.0–0.2)

## 2020-10-05 LAB — COMPREHENSIVE METABOLIC PANEL
ALT: 32 U/L (ref 0–44)
AST: 43 U/L — ABNORMAL HIGH (ref 15–41)
Albumin: 4.3 g/dL (ref 3.5–5.0)
Alkaline Phosphatase: 79 U/L (ref 38–126)
Anion gap: 10 (ref 5–15)
BUN: 17 mg/dL (ref 6–20)
CO2: 25 mmol/L (ref 22–32)
Calcium: 9 mg/dL (ref 8.9–10.3)
Chloride: 101 mmol/L (ref 98–111)
Creatinine, Ser: 0.93 mg/dL (ref 0.61–1.24)
GFR, Estimated: >60 mL/min (ref >60)
Glucose, Bld: 98 mg/dL (ref 70–99)
Potassium: 5 mmol/L (ref 3.5–5.1)
Sodium: 136 mmol/L (ref 135–145)
Total Bilirubin: 1.2 mg/dL (ref 0.3–1.2)
Total Protein: 7.4 g/dL (ref 6.5–8.1)

## 2020-10-05 LAB — SURGICAL PCR SCREEN
MRSA, PCR: NEGATIVE
Staphylococcus aureus: POSITIVE — AB

## 2020-10-05 LAB — PROTIME-INR
INR: 1.1 (ref 0.8–1.2)
Prothrombin Time: 13.3 seconds (ref 11.4–15.2)

## 2020-10-05 NOTE — Progress Notes (Addendum)
Informed pt. to  Hold [place] 3 days prior to surgery and to discuss further with prescriber if any questions or concerns.

## 2020-10-11 ENCOUNTER — Other Ambulatory Visit (HOSPITAL_COMMUNITY)
Admission: RE | Admit: 2020-10-11 | Discharge: 2020-10-11 | Disposition: A | Discharge status: Home / Self Care | Payer: Medicaid Other | Source: Ambulatory Visit | Attending: Orthopedic Surgery | Admitting: Orthopedic Surgery

## 2020-10-11 DIAGNOSIS — Z01812 Encounter for preprocedural laboratory examination: Secondary | ICD-10-CM | POA: Insufficient documentation

## 2020-10-11 DIAGNOSIS — Z20822 Contact with and (suspected) exposure to covid-19: Secondary | ICD-10-CM | POA: Diagnosis not present

## 2020-10-12 LAB — SARS CORONAVIRUS 2 (TAT 6-24 HRS): SARS Coronavirus 2: NEGATIVE

## 2020-10-14 ENCOUNTER — Ambulatory Visit (HOSPITAL_COMMUNITY): Payer: Medicaid Other

## 2020-10-14 ENCOUNTER — Ambulatory Visit (HOSPITAL_COMMUNITY)
Admission: RE | Admit: 2020-10-14 | Discharge: 2020-10-15 | Disposition: A | Discharge status: Home / Self Care | Payer: Medicaid Other | Attending: Orthopedic Surgery | Admitting: Orthopedic Surgery

## 2020-10-14 ENCOUNTER — Other Ambulatory Visit: Payer: Self-pay

## 2020-10-14 ENCOUNTER — Encounter (HOSPITAL_COMMUNITY)
Admission: RE | Disposition: A | Discharge status: Home / Self Care | Payer: Self-pay | Source: Home / Self Care | Attending: Orthopedic Surgery

## 2020-10-14 ENCOUNTER — Ambulatory Visit (HOSPITAL_COMMUNITY): Payer: Medicaid Other | Admitting: Certified Registered Nurse Anesthetist

## 2020-10-14 ENCOUNTER — Ambulatory Visit (HOSPITAL_COMMUNITY): Payer: Medicaid Other | Admitting: Physician Assistant

## 2020-10-14 ENCOUNTER — Encounter (HOSPITAL_COMMUNITY): Payer: Self-pay | Admitting: Orthopedic Surgery

## 2020-10-14 DIAGNOSIS — M1612 Unilateral primary osteoarthritis, left hip: Secondary | ICD-10-CM | POA: Diagnosis not present

## 2020-10-14 DIAGNOSIS — S82122A Displaced fracture of lateral condyle of left tibia, initial encounter for closed fracture: Secondary | ICD-10-CM

## 2020-10-14 DIAGNOSIS — Z79899 Other long term (current) drug therapy: Secondary | ICD-10-CM | POA: Insufficient documentation

## 2020-10-14 DIAGNOSIS — Z91030 Bee allergy status: Secondary | ICD-10-CM | POA: Insufficient documentation

## 2020-10-14 DIAGNOSIS — Z888 Allergy status to other drugs, medicaments and biological substances status: Secondary | ICD-10-CM | POA: Diagnosis not present

## 2020-10-14 DIAGNOSIS — Z09 Encounter for follow-up examination after completed treatment for conditions other than malignant neoplasm: Secondary | ICD-10-CM

## 2020-10-14 HISTORY — PX: TOTAL HIP ARTHROPLASTY: SHX124

## 2020-10-14 LAB — TYPE AND SCREEN
ABO/RH(D): A POS
Antibody Screen: NEGATIVE

## 2020-10-14 SURGERY — ARTHROPLASTY, HIP, TOTAL, ANTERIOR APPROACH
Anesthesia: General | Site: Hip | Laterality: Left

## 2020-10-14 MED ORDER — MIDAZOLAM HCL 5 MG/5ML IJ SOLN
INTRAMUSCULAR | Status: DC | PRN
  Administered 2020-10-14: 2 mg via INTRAVENOUS

## 2020-10-14 MED ORDER — KETAMINE HCL 10 MG/ML IJ SOLN
INTRAMUSCULAR | Status: AC
  Filled 2020-10-14: qty 1

## 2020-10-14 MED ORDER — ONDANSETRON HCL 4 MG PO TABS: 4.0000 mg | ORAL_TABLET | Freq: Four times a day (QID) | ORAL | Status: DC | PRN

## 2020-10-14 MED ORDER — PANTOPRAZOLE SODIUM 40 MG PO TBEC
40.0000 mg | DELAYED_RELEASE_TABLET | Freq: Every day | ORAL | Status: DC
  Administered 2020-10-15: 40 mg via ORAL
  Filled 2020-10-14: qty 1

## 2020-10-14 MED ORDER — FENTANYL CITRATE (PF) 100 MCG/2ML IJ SOLN
INTRAMUSCULAR | Status: AC
  Filled 2020-10-14: qty 2

## 2020-10-14 MED ORDER — DEXMEDETOMIDINE (PRECEDEX) IN NS 20 MCG/5ML (4 MCG/ML) IV SYRINGE
PREFILLED_SYRINGE | INTRAVENOUS | Status: DC | PRN
  Administered 2020-10-14: 8 ug via INTRAVENOUS
  Administered 2020-10-14: 12 ug via INTRAVENOUS

## 2020-10-14 MED ORDER — EPHEDRINE 5 MG/ML INJ
INTRAVENOUS | Status: AC
  Filled 2020-10-14: qty 10

## 2020-10-14 MED ORDER — OXYCODONE HCL ER 10 MG PO T12A
10.0000 mg | EXTENDED_RELEASE_TABLET | Freq: Two times a day (BID) | ORAL | Status: DC
Start: 2020-10-14 — End: 2020-10-14
  Administered 2020-10-14: 10 mg via ORAL

## 2020-10-14 MED ORDER — KETOROLAC TROMETHAMINE 30 MG/ML IJ SOLN
INTRAMUSCULAR | Status: DC | PRN
  Administered 2020-10-14: 30 mg via INTRAVENOUS

## 2020-10-14 MED ORDER — FENTANYL CITRATE (PF) 100 MCG/2ML IJ SOLN
100.0000 ug | Freq: Once | INTRAMUSCULAR | Status: AC
  Administered 2020-10-14: 100 ug via INTRAVENOUS

## 2020-10-14 MED ORDER — HYDROMORPHONE HCL 1 MG/ML IJ SOLN
INTRAMUSCULAR | Status: AC
  Filled 2020-10-14: qty 1

## 2020-10-14 MED ORDER — HYDROMORPHONE HCL 1 MG/ML IJ SOLN
INTRAMUSCULAR | Status: DC | PRN
Start: 2020-10-14 — End: 2020-10-14
  Administered 2020-10-14 (×2): 1 mg via INTRAVENOUS

## 2020-10-14 MED ORDER — ISOPROPYL ALCOHOL 70 % SOLN
Status: DC | PRN
  Administered 2020-10-14: 1 via TOPICAL

## 2020-10-14 MED ORDER — HYDROMORPHONE HCL 1 MG/ML IJ SOLN
0.2500 mg | INTRAMUSCULAR | Status: DC | PRN
  Administered 2020-10-14 (×4): 0.5 mg via INTRAVENOUS

## 2020-10-14 MED ORDER — FENTANYL CITRATE (PF) 100 MCG/2ML IJ SOLN
INTRAMUSCULAR | Status: DC | PRN
  Administered 2020-10-14: 100 ug via INTRAVENOUS
  Administered 2020-10-14 (×2): 50 ug via INTRAVENOUS

## 2020-10-14 MED ORDER — ONDANSETRON HCL 4 MG/2ML IJ SOLN
INTRAMUSCULAR | Status: DC | PRN
  Administered 2020-10-14: 4 mg via INTRAVENOUS

## 2020-10-14 MED ORDER — KETOROLAC TROMETHAMINE 30 MG/ML IJ SOLN
INTRAMUSCULAR | Status: AC
  Filled 2020-10-14: qty 1

## 2020-10-14 MED ORDER — OXYCODONE HCL 5 MG PO TABS
ORAL_TABLET | ORAL | Status: AC
  Filled 2020-10-14: qty 1

## 2020-10-14 MED ORDER — PREGABALIN 75 MG PO CAPS
150.0000 mg | ORAL_CAPSULE | Freq: Two times a day (BID) | ORAL | Status: DC
  Administered 2020-10-14 – 2020-10-15 (×2): 150 mg via ORAL
  Filled 2020-10-14 (×2): qty 2

## 2020-10-14 MED ORDER — SENNA 8.6 MG PO TABS
1.0000 | ORAL_TABLET | Freq: Two times a day (BID) | ORAL | Status: DC
  Administered 2020-10-14: 8.6 mg via ORAL
  Filled 2020-10-14: qty 1

## 2020-10-14 MED ORDER — METOPROLOL SUCCINATE ER 50 MG PO TB24
100.0000 mg | ORAL_TABLET | Freq: Every day | ORAL | Status: DC
  Administered 2020-10-15: 100 mg via ORAL
  Filled 2020-10-14: qty 2

## 2020-10-14 MED ORDER — EPHEDRINE SULFATE-NACL 50-0.9 MG/10ML-% IV SOSY
PREFILLED_SYRINGE | INTRAVENOUS | Status: DC | PRN
  Administered 2020-10-14: 15 mg via INTRAVENOUS

## 2020-10-14 MED ORDER — ORAL CARE MOUTH RINSE: 15.0000 mL | Freq: Once | OROMUCOSAL | Status: AC

## 2020-10-14 MED ORDER — OXYCODONE HCL 5 MG/5ML PO SOLN: 5.0000 mg | Freq: Once | ORAL | Status: AC | PRN

## 2020-10-14 MED ORDER — ISOPROPYL ALCOHOL 70 % SOLN
Status: AC
  Filled 2020-10-14: qty 480

## 2020-10-14 MED ORDER — BUPIVACAINE-EPINEPHRINE (PF) 0.25% -1:200000 IJ SOLN
INTRAMUSCULAR | Status: AC
  Filled 2020-10-14: qty 30

## 2020-10-14 MED ORDER — SODIUM CHLORIDE (PF) 0.9 % IJ SOLN
INTRAMUSCULAR | Status: AC
  Filled 2020-10-14: qty 30

## 2020-10-14 MED ORDER — MENTHOL 3 MG MT LOZG: 1.0000 | LOZENGE | OROMUCOSAL | Status: DC | PRN

## 2020-10-14 MED ORDER — KETOROLAC TROMETHAMINE 30 MG/ML IJ SOLN
30.0000 mg | Freq: Once | INTRAMUSCULAR | Status: AC | PRN
  Administered 2020-10-14: 30 mg via INTRAVENOUS

## 2020-10-14 MED ORDER — LIDOCAINE HCL (PF) 2 % IJ SOLN
INTRAMUSCULAR | Status: AC
  Filled 2020-10-14: qty 15

## 2020-10-14 MED ORDER — ONDANSETRON HCL 4 MG/2ML IJ SOLN: 4.0000 mg | Freq: Once | INTRAMUSCULAR | Status: DC | PRN

## 2020-10-14 MED ORDER — SODIUM CHLORIDE 0.9 % IV SOLN
INTRAVENOUS | Status: AC | PRN
  Administered 2020-10-14: 250 mL

## 2020-10-14 MED ORDER — OXYCODONE HCL 5 MG PO TABS
5.0000 mg | ORAL_TABLET | Freq: Once | ORAL | Status: AC | PRN
  Administered 2020-10-14: 5 mg via ORAL

## 2020-10-14 MED ORDER — ACETAMINOPHEN 325 MG PO TABS: 325.0000 mg | ORAL_TABLET | Freq: Four times a day (QID) | ORAL | Status: DC | PRN

## 2020-10-14 MED ORDER — ROCURONIUM BROMIDE 100 MG/10ML IV SOLN
INTRAVENOUS | Status: DC | PRN
  Administered 2020-10-14: 80 mg via INTRAVENOUS

## 2020-10-14 MED ORDER — CHLORHEXIDINE GLUCONATE 0.12 % MT SOLN
15.0000 mL | Freq: Once | OROMUCOSAL | Status: AC
  Administered 2020-10-14: 15 mL via OROMUCOSAL

## 2020-10-14 MED ORDER — METOCLOPRAMIDE HCL 5 MG/ML IJ SOLN: 5.0000 mg | Freq: Three times a day (TID) | INTRAMUSCULAR | Status: DC | PRN

## 2020-10-14 MED ORDER — ALUM & MAG HYDROXIDE-SIMETH 200-200-20 MG/5ML PO SUSP: 30.0000 mL | ORAL | Status: DC | PRN

## 2020-10-14 MED ORDER — CEFAZOLIN SODIUM-DEXTROSE 2-4 GM/100ML-% IV SOLN
2.0000 g | Freq: Four times a day (QID) | INTRAVENOUS | Status: AC
  Administered 2020-10-14 (×2): 2 g via INTRAVENOUS
  Filled 2020-10-14 (×2): qty 100

## 2020-10-14 MED ORDER — BUPIVACAINE-EPINEPHRINE 0.25% -1:200000 IJ SOLN
INTRAMUSCULAR | Status: DC | PRN
  Administered 2020-10-14: 30 mL

## 2020-10-14 MED ORDER — POVIDONE-IODINE 10 % EX SWAB: 2.0000 "application " | Freq: Once | CUTANEOUS | Status: AC

## 2020-10-14 MED ORDER — SODIUM CHLORIDE 0.9 % IV SOLN: INTRAVENOUS | Status: DC

## 2020-10-14 MED ORDER — LACTATED RINGERS IV SOLN: INTRAVENOUS | Status: DC

## 2020-10-14 MED ORDER — BACLOFEN 20 MG PO TABS
20.0000 mg | ORAL_TABLET | Freq: Two times a day (BID) | ORAL | Status: DC
  Administered 2020-10-14 – 2020-10-15 (×2): 20 mg via ORAL
  Filled 2020-10-14 (×2): qty 1

## 2020-10-14 MED ORDER — POLYETHYLENE GLYCOL 3350 17 G PO PACK: 17.0000 g | PACK | Freq: Every day | ORAL | Status: DC | PRN

## 2020-10-14 MED ORDER — DIPHENHYDRAMINE HCL 12.5 MG/5ML PO ELIX: 12.5000 mg | ORAL_SOLUTION | ORAL | Status: DC | PRN

## 2020-10-14 MED ORDER — METOCLOPRAMIDE HCL 5 MG PO TABS: 5.0000 mg | ORAL_TABLET | Freq: Three times a day (TID) | ORAL | Status: DC | PRN

## 2020-10-14 MED ORDER — FENTANYL CITRATE (PF) 100 MCG/2ML IJ SOLN: 50.0000 ug | Freq: Once | INTRAMUSCULAR | Status: AC

## 2020-10-14 MED ORDER — MIDAZOLAM HCL 2 MG/2ML IJ SOLN
INTRAMUSCULAR | Status: AC
  Filled 2020-10-14: qty 2

## 2020-10-14 MED ORDER — ALBUTEROL SULFATE HFA 108 (90 BASE) MCG/ACT IN AERS
INHALATION_SPRAY | RESPIRATORY_TRACT | Status: AC
  Filled 2020-10-14: qty 6.7

## 2020-10-14 MED ORDER — WATER FOR IRRIGATION, STERILE IR SOLN
Status: DC | PRN
  Administered 2020-10-14: 2000 mL

## 2020-10-14 MED ORDER — HYDROMORPHONE HCL 2 MG PO TABS
2.0000 mg | ORAL_TABLET | ORAL | Status: DC | PRN
  Administered 2020-10-14 – 2020-10-15 (×5): 4 mg via ORAL
  Filled 2020-10-14 (×5): qty 2

## 2020-10-14 MED ORDER — DEXAMETHASONE SODIUM PHOSPHATE 10 MG/ML IJ SOLN
10.0000 mg | Freq: Once | INTRAMUSCULAR | Status: AC
  Administered 2020-10-15: 10 mg via INTRAVENOUS
  Filled 2020-10-14: qty 1

## 2020-10-14 MED ORDER — OXYCODONE HCL ER 20 MG PO T12A
20.0000 mg | EXTENDED_RELEASE_TABLET | Freq: Two times a day (BID) | ORAL | Status: DC
  Administered 2020-10-14 – 2020-10-15 (×2): 20 mg via ORAL
  Filled 2020-10-14 (×2): qty 1

## 2020-10-14 MED ORDER — ICOSAPENT ETHYL 1 G PO CAPS
1.0000 g | ORAL_CAPSULE | Freq: Two times a day (BID) | ORAL | Status: DC
  Administered 2020-10-14 – 2020-10-15 (×2): 1 g via ORAL
  Filled 2020-10-14 (×2): qty 1

## 2020-10-14 MED ORDER — HYDROMORPHONE HCL 2 MG/ML IJ SOLN
INTRAMUSCULAR | Status: AC
  Filled 2020-10-14: qty 1

## 2020-10-14 MED ORDER — CEFAZOLIN SODIUM-DEXTROSE 2-4 GM/100ML-% IV SOLN
2.0000 g | INTRAVENOUS | Status: AC
  Administered 2020-10-14: 2 g via INTRAVENOUS
  Filled 2020-10-14: qty 100

## 2020-10-14 MED ORDER — LIDOCAINE HCL (PF) 2 % IJ SOLN
INTRAMUSCULAR | Status: DC | PRN
  Administered 2020-10-14: 1.5 mg/kg/h via INTRADERMAL

## 2020-10-14 MED ORDER — DEXAMETHASONE SODIUM PHOSPHATE 10 MG/ML IJ SOLN
INTRAMUSCULAR | Status: AC
  Filled 2020-10-14: qty 1

## 2020-10-14 MED ORDER — LIDOCAINE HCL (CARDIAC) PF 100 MG/5ML IV SOSY
PREFILLED_SYRINGE | INTRAVENOUS | Status: DC | PRN
  Administered 2020-10-14: 100 mg via INTRAVENOUS

## 2020-10-14 MED ORDER — SUGAMMADEX SODIUM 200 MG/2ML IV SOLN
INTRAVENOUS | Status: DC | PRN
  Administered 2020-10-14: 200 mg via INTRAVENOUS

## 2020-10-14 MED ORDER — FENTANYL CITRATE (PF) 100 MCG/2ML IJ SOLN
INTRAMUSCULAR | Status: AC
  Administered 2020-10-14: 100 ug via INTRAVENOUS
  Filled 2020-10-14: qty 2

## 2020-10-14 MED ORDER — PROPOFOL 1000 MG/100ML IV EMUL
INTRAVENOUS | Status: AC
  Filled 2020-10-14: qty 200

## 2020-10-14 MED ORDER — KETAMINE HCL 10 MG/ML IJ SOLN
INTRAMUSCULAR | Status: DC | PRN
  Administered 2020-10-14: 70 mg via INTRAVENOUS

## 2020-10-14 MED ORDER — METHOCARBAMOL 500 MG PO TABS
500.0000 mg | ORAL_TABLET | Freq: Four times a day (QID) | ORAL | Status: DC | PRN
  Administered 2020-10-14 – 2020-10-15 (×2): 500 mg via ORAL
  Filled 2020-10-14 (×2): qty 1

## 2020-10-14 MED ORDER — ACETAMINOPHEN 10 MG/ML IV SOLN
1000.0000 mg | Freq: Once | INTRAVENOUS | Status: AC
  Administered 2020-10-14: 1000 mg via INTRAVENOUS
  Filled 2020-10-14: qty 100

## 2020-10-14 MED ORDER — POVIDONE-IODINE 10 % EX SWAB
2.0000 "application " | Freq: Once | CUTANEOUS | Status: AC
  Administered 2020-10-14: 2 via TOPICAL

## 2020-10-14 MED ORDER — SODIUM CHLORIDE (PF) 0.9 % IJ SOLN
INTRAMUSCULAR | Status: DC | PRN
  Administered 2020-10-14: 30 mL via INTRAVENOUS

## 2020-10-14 MED ORDER — DEXAMETHASONE SODIUM PHOSPHATE 10 MG/ML IJ SOLN
INTRAMUSCULAR | Status: DC | PRN
  Administered 2020-10-14: 10 mg via INTRAVENOUS

## 2020-10-14 MED ORDER — ALBUTEROL SULFATE HFA 108 (90 BASE) MCG/ACT IN AERS: 2.0000 | INHALATION_SPRAY | Freq: Four times a day (QID) | RESPIRATORY_TRACT | Status: DC | PRN

## 2020-10-14 MED ORDER — HYDROMORPHONE HCL 1 MG/ML IJ SOLN: 1.0000 mg | INTRAMUSCULAR | Status: DC | PRN

## 2020-10-14 MED ORDER — OXYMETAZOLINE HCL 0.05 % NA SOLN
3.0000 | Freq: Every evening | NASAL | Status: DC | PRN
  Filled 2020-10-14: qty 15

## 2020-10-14 MED ORDER — PHENOL 1.4 % MT LIQD: 1.0000 | OROMUCOSAL | Status: DC | PRN

## 2020-10-14 MED ORDER — PROPOFOL 10 MG/ML IV BOLUS
INTRAVENOUS | Status: DC | PRN
  Administered 2020-10-14: 220 mg via INTRAVENOUS

## 2020-10-14 MED ORDER — TRANEXAMIC ACID-NACL 1000-0.7 MG/100ML-% IV SOLN
1000.0000 mg | INTRAVENOUS | Status: AC
  Administered 2020-10-14: 1000 mg via INTRAVENOUS
  Filled 2020-10-14: qty 100

## 2020-10-14 MED ORDER — DOCUSATE SODIUM 100 MG PO CAPS
100.0000 mg | ORAL_CAPSULE | Freq: Two times a day (BID) | ORAL | Status: DC
  Administered 2020-10-14: 100 mg via ORAL
  Filled 2020-10-14: qty 1

## 2020-10-14 MED ORDER — ONDANSETRON HCL 4 MG/2ML IJ SOLN: 4.0000 mg | Freq: Four times a day (QID) | INTRAMUSCULAR | Status: DC | PRN

## 2020-10-14 MED ORDER — SODIUM CHLORIDE 0.9 % IR SOLN
Status: DC | PRN
  Administered 2020-10-14: 3000 mL

## 2020-10-14 MED ORDER — METHOCARBAMOL 1000 MG/10ML IJ SOLN
500.0000 mg | Freq: Four times a day (QID) | INTRAVENOUS | Status: DC | PRN
  Filled 2020-10-14: qty 5

## 2020-10-14 MED ORDER — ONDANSETRON HCL 4 MG/2ML IJ SOLN
INTRAMUSCULAR | Status: AC
  Filled 2020-10-14: qty 2

## 2020-10-14 MED ORDER — ASPIRIN 81 MG PO CHEW
81.0000 mg | CHEWABLE_TABLET | Freq: Two times a day (BID) | ORAL | Status: DC
  Administered 2020-10-14 – 2020-10-15 (×2): 81 mg via ORAL
  Filled 2020-10-14 (×2): qty 1

## 2020-10-14 MED ORDER — BUPRENORPHINE HCL 900 MCG BU FILM: 900.0000 ug | ORAL_FILM | Freq: Two times a day (BID) | BUCCAL | Status: DC

## 2020-10-14 SURGICAL SUPPLY — 68 items
ACETAB CUP W/GRIPTION 54 (Plate) ×2 IMPLANT
ADH SKN CLS APL DERMABOND .7 (GAUZE/BANDAGES/DRESSINGS) ×1
APL PRP STRL LF DISP 70% ISPRP (MISCELLANEOUS) ×1
BAG DECANTER FOR FLEXI CONT (MISCELLANEOUS) IMPLANT
BAG SPEC THK2 15X12 ZIP CLS (MISCELLANEOUS)
BAG ZIPLOCK 12X15 (MISCELLANEOUS) IMPLANT
BALL HIP CERAMIC (Hips) IMPLANT
BLADE SURG SZ10 CARB STEEL (BLADE) IMPLANT
CHLORAPREP W/TINT 26 (MISCELLANEOUS) ×2 IMPLANT
COVER PERINEAL POST (MISCELLANEOUS) ×2 IMPLANT
COVER SURGICAL LIGHT HANDLE (MISCELLANEOUS) ×2 IMPLANT
COVER WAND RF STERILE (DRAPES) IMPLANT
CUP ACETAB W/GRIPTION 54 (Plate) IMPLANT
DECANTER SPIKE VIAL GLASS SM (MISCELLANEOUS) ×2 IMPLANT
DERMABOND ADVANCED (GAUZE/BANDAGES/DRESSINGS) ×1
DERMABOND ADVANCED .7 DNX12 (GAUZE/BANDAGES/DRESSINGS) ×2 IMPLANT
DRAPE IMP U-DRAPE 54X76 (DRAPES) ×2 IMPLANT
DRAPE SHEET LG 3/4 BI-LAMINATE (DRAPES) ×6 IMPLANT
DRAPE STERI IOBAN 125X83 (DRAPES) IMPLANT
DRAPE U-SHAPE 47X51 STRL (DRAPES) ×4 IMPLANT
DRSG AQUACEL AG ADV 3.5X10 (GAUZE/BANDAGES/DRESSINGS) ×2 IMPLANT
ELECT REM PT RETURN 15FT ADLT (MISCELLANEOUS) ×2 IMPLANT
GAUZE SPONGE 4X4 12PLY STRL (GAUZE/BANDAGES/DRESSINGS) ×2 IMPLANT
GLOVE BIO SURGEON STRL SZ8.5 (GLOVE) ×4 IMPLANT
GLOVE BIOGEL PI IND STRL 8.5 (GLOVE) ×1 IMPLANT
GLOVE BIOGEL PI INDICATOR 8.5 (GLOVE) ×1
GLOVE SRG 8 PF TXTR STRL LF DI (GLOVE) ×1 IMPLANT
GLOVE SURG ENC TEXT LTX SZ7.5 (GLOVE) ×4 IMPLANT
GLOVE SURG UNDER POLY LF SZ8 (GLOVE) ×2
GOWN SPEC L3 XXLG W/TWL (GOWN DISPOSABLE) ×2 IMPLANT
GOWN STRL REUS W/ TWL LRG LVL3 (GOWN DISPOSABLE) ×1 IMPLANT
GOWN STRL REUS W/TWL LRG LVL3 (GOWN DISPOSABLE) ×2
HANDPIECE INTERPULSE COAX TIP (DISPOSABLE) ×2
HIP BALL CERAMIC (Hips) ×2 IMPLANT
HOLDER FOLEY CATH W/STRAP (MISCELLANEOUS) ×2 IMPLANT
HOOD PEEL AWAY FLYTE STAYCOOL (MISCELLANEOUS) ×8 IMPLANT
JET LAVAGE IRRISEPT WOUND (IRRIGATION / IRRIGATOR) ×2
KIT TURNOVER KIT A (KITS) IMPLANT
LAVAGE JET IRRISEPT WOUND (IRRIGATION / IRRIGATOR) ×1 IMPLANT
LINER PINN ACET NEUT 32X54 ×1 IMPLANT
MANIFOLD NEPTUNE II (INSTRUMENTS) ×2 IMPLANT
MARKER SKIN DUAL TIP RULER LAB (MISCELLANEOUS) ×2 IMPLANT
NDL SAFETY ECLIPSE 18X1.5 (NEEDLE) ×1 IMPLANT
NDL SPNL 18GX3.5 QUINCKE PK (NEEDLE) ×1 IMPLANT
NEEDLE HYPO 18GX1.5 SHARP (NEEDLE) ×2
NEEDLE SPNL 18GX3.5 QUINCKE PK (NEEDLE) ×2 IMPLANT
PACK ANTERIOR HIP CUSTOM (KITS) ×2 IMPLANT
PENCIL SMOKE EVACUATOR (MISCELLANEOUS) IMPLANT
SAW OSC TIP CART 19.5X105X1.3 (SAW) ×2 IMPLANT
SCREW 6.5MMX30MM (Screw) ×1 IMPLANT
SEALER BIPOLAR AQUA 6.0 (INSTRUMENTS) ×2 IMPLANT
SET HNDPC FAN SPRY TIP SCT (DISPOSABLE) ×1 IMPLANT
STAPLER VISISTAT 35W (STAPLE) ×1 IMPLANT
STEM TRI LOC BPS GRIPTON SZ 5 (Hips) IMPLANT
SUT ETHIBOND NAB CT1 #1 30IN (SUTURE) ×4 IMPLANT
SUT MNCRL AB 3-0 PS2 18 (SUTURE) ×2 IMPLANT
SUT MNCRL AB 4-0 PS2 18 (SUTURE) ×2 IMPLANT
SUT MON AB 2-0 CT1 36 (SUTURE) ×4 IMPLANT
SUT STRATAFIX PDO 1 14 VIOLET (SUTURE) ×2
SUT STRATFX PDO 1 14 VIOLET (SUTURE) ×1
SUT VIC AB 2-0 CT1 27 (SUTURE) ×2
SUT VIC AB 2-0 CT1 TAPERPNT 27 (SUTURE) ×1 IMPLANT
SUTURE STRATFX PDO 1 14 VIOLET (SUTURE) ×1 IMPLANT
SYR 3ML LL SCALE MARK (SYRINGE) ×2 IMPLANT
TRAY FOLEY MTR SLVR 16FR STAT (SET/KITS/TRAYS/PACK) IMPLANT
TRI LOC BPS W GRIPTON SZ 5 (Hips) ×2 IMPLANT
TUBE SUCTION HIGH CAP CLEAR NV (SUCTIONS) ×2 IMPLANT
WATER STERILE IRR 1000ML POUR (IV SOLUTION) ×2 IMPLANT

## 2020-10-14 NOTE — Interval H&P Note (Signed)
History and Physical Interval Note:  10/14/2020 7:38 AM  Bryan Huffman  has presented today for surgery, with the diagnosis of left hip osteoarthritis.  The various methods of treatment have been discussed with the patient and family. After consideration of risks, benefits and other options for treatment, the patient has consented to  Procedure(s): TOTAL HIP ARTHROPLASTY ANTERIOR APPROACH (Left) as a surgical intervention.  The patient's history has been reviewed, patient examined, no change in status, stable for surgery.  I have reviewed the patient's chart and labs.  Questions were answered to the patient's satisfaction.    The risks, benefits, and alternatives were discussed with the patient. There are risks associated with the surgery including, but not limited to, problems with anesthesia (death), infection, instability (giving out of the joint), dislocation, differences in leg length/angulation/rotation, fracture of bones, loosening or failure of implants, hematoma (blood accumulation) which may require surgical drainage, blood clots, pulmonary embolism, nerve injury (foot drop and lateral thigh numbness), and blood vessel injury. The patient understands these risks and elects to proceed.    Iline Oven Saber Dickerman

## 2020-10-14 NOTE — Anesthesia Postprocedure Evaluation (Signed)
Anesthesia Post Note  Patient: Bryan Huffman  Procedure(s) Performed: TOTAL HIP ARTHROPLASTY ANTERIOR APPROACH (Left Hip)     Patient location during evaluation: PACU Anesthesia Type: General Level of consciousness: awake and alert Pain management: pain level controlled Vital Signs Assessment: post-procedure vital signs reviewed and stable Respiratory status: spontaneous breathing, nonlabored ventilation, respiratory function stable and patient connected to nasal cannula oxygen Cardiovascular status: blood pressure returned to baseline and stable Postop Assessment: no apparent nausea or vomiting Anesthetic complications: no   No complications documented.  Last Vitals:  Vitals:   10/14/20 1100 10/14/20 1112  BP: 135/90   Pulse: 70 70  Resp: 12 20  Temp:    SpO2: 100% 100%    Last Pain:  Vitals:   10/14/20 1112  TempSrc:   PainSc: 5                  Derell Bruun S

## 2020-10-14 NOTE — Anesthesia Procedure Notes (Signed)
Procedure Name: Intubation Date/Time: 10/14/2020 8:53 AM Performed by: [place] Latrina Guttman C, CRNA Pre-anesthesia Checklist: Patient identified, Emergency Drugs available, Suction available and Patient being monitored Patient Re-evaluated:Patient Re-evaluated prior to induction Oxygen Delivery Method: Circle system utilized Preoxygenation: Pre-oxygenation with 100% oxygen Induction Type: IV induction Ventilation: Mask ventilation without difficulty and Oral airway inserted - appropriate to patient size Laryngoscope Size: Mac and 4 Grade View: Grade III Tube type: Oral Tube size: 7.5 mm Number of attempts: 1 Airway Equipment and Method: Stylet and Oral airway Placement Confirmation: ETT inserted through vocal cords under direct vision,  positive ETCO2 and breath sounds checked- equal and bilateral Secured at: 22 cm Tube secured with: Tape Dental Injury: Teeth and Oropharynx as per pre-operative assessment

## 2020-10-14 NOTE — Op Note (Signed)
OPERATIVE REPORT  SURGEON: Samson Frederic, MD   ASSISTANT: Barrie Dunker, PA-C.  PREOPERATIVE DIAGNOSIS: Left hip arthritis.   POSTOPERATIVE DIAGNOSIS: Left hip arthritis.   PROCEDURE: Left total hip arthroplasty, anterior approach.   IMPLANTS: DePuy Tri Lock stem, size 5, hi offset. DePuy Pinnacle Cup, size 54 mm. DePuy Altrx liner, size 32 by 54 mm, neutral. DePuy Biolox ceramic head ball, size 32 + 5 mm. 6.5 mm cancellous bone screw x1.  ANESTHESIA:  General  ESTIMATED BLOOD LOSS: 400 mL.   ANTIBIOTICS: 2g Ancef.  DRAINS: None.  COMPLICATIONS: None.   CONDITION: PACU - hemodynamically stable.   BRIEF CLINICAL NOTE: Bryan Huffman is a 50 y.o. male with a long-standing history of Left hip arthritis. After failing conservative management, the patient was indicated for total hip arthroplasty. The risks, benefits, and alternatives to the procedure were explained, and the patient elected to proceed.  PROCEDURE IN DETAIL: Surgical site was marked by myself in the pre-op holding area. Once inside the operating room, spinal anesthesia was obtained, and a foley catheter was inserted. The patient was then positioned on the Hana table.  All bony prominences were well padded.  The hip was prepped and draped in the normal sterile surgical fashion.  A time-out was called verifying side and site of surgery. The patient received IV antibiotics within 60 minutes of beginning the procedure.   The direct anterior approach to the hip was performed through the Hueter interval.  Lateral femoral circumflex vessels were treated with the Auqumantys. The anterior capsule was exposed and an inverted T capsulotomy was made. The femoral neck cut was made to the level of the templated cut.  A corkscrew was placed into the head and the head was removed.  The femoral head was found to have eburnated bone. The head was passed to the back table and was measured.   Acetabular exposure was achieved,  and the pulvinar and labrum were excised. Sequential reaming of the acetabulum was then performed up to a size 53 mm reamer. A 54 mm cup was then opened and impacted into place at approximately 45 degrees of abduction and 20 degrees of anteversion. I elected to augment the already acceptable press fit fixation with a single 6.5 mm cancellous screw. The final polyethylene liner was impacted into place and acetabular osteophytes were removed.    I then gained femoral exposure taking care to protect the abductors and greater trochanter.  This was performed using standard external rotation, extension, and adduction.  The capsule was peeled off the inner aspect of the greater trochanter, taking care to preserve the short external rotators. A cookie cutter was used to enter the femoral canal, and then the femoral canal finder was placed.  Sequential broaching was performed up to a size 5.  Calcar planer was used on the femoral neck remnant.  I placed a hi offset neck and a trial head ball.  The hip was reduced.  Leg lengths and offset were checked fluoroscopically.  The hip was dislocated and trial components were removed.  The final implants were placed, and the hip was reduced.  Fluoroscopy was used to confirm component position and leg lengths.  At 90 degrees of external rotation and full extension, the hip was stable to an anterior directed force.   The wound was copiously irrigated with Irrisept solution and normal saline using pule lavage.  Marcaine solution was injected into the periarticular soft tissue.  The wound was closed in layers using #1 Stratafix  for the fascia, 2-0 Vicryl for the subcutaneous fat, 2-0 Monocryl for the deep dermal layer, and staples plus Dermabond for the skin.  Once the glue was fully dried, an Aquacell Ag dressing was applied.  The patient was transported to the recovery room in stable condition.  Sponge, needle, and instrument counts were correct at the end of the case x2.  The  patient tolerated the procedure well and there were no known complications.  Please note that a surgical assistant was a medical necessity for this procedure to perform it in a safe and expeditious manner. Assistant was necessary to provide appropriate retraction of vital neurovascular structures, to prevent femoral fracture, and to allow for anatomic placement of the prosthesis.

## 2020-10-14 NOTE — Anesthesia Preprocedure Evaluation (Signed)
Anesthesia Evaluation  Patient identified by MRN, date of birth, ID band Patient awake    Reviewed: Allergy & Precautions, H&P , NPO status , Patient's Chart, lab work & pertinent test results  Airway Mallampati: II  TM Distance: <3 FB Neck ROM: Limited    Dental no notable dental hx.    Pulmonary sleep apnea and Continuous Positive Airway Pressure Ventilation , Current Smoker,    breath sounds clear to auscultation + decreased breath sounds      Cardiovascular hypertension, Pt. on medications and Pt. on home beta blockers Normal cardiovascular exam Rhythm:Regular Rate:Normal     Neuro/Psych Anxiety Chronic pain    GI/Hepatic Neg liver ROS, GERD  Medicated,  Endo/Other  Morbid obesity  Renal/GU negative Renal ROS  negative genitourinary   Musculoskeletal  (+) Arthritis ,   Abdominal (+) + obese,   Peds negative pediatric ROS (+)  Hematology negative hematology ROS (+)   Anesthesia Other Findings   Reproductive/Obstetrics negative OB ROS                             Anesthesia Physical Anesthesia Plan  ASA: III  Anesthesia Plan: General   Post-op Pain Management:    Induction: Intravenous  PONV Risk Score and Plan: 2 and Ondansetron, Dexamethasone and Treatment may vary due to age or medical condition  Airway Management Planned: Oral ETT  Additional Equipment:   Intra-op Plan:   Post-operative Plan: Extubation in OR  Informed Consent: I have reviewed the patients History and Physical, chart, labs and discussed the procedure including the risks, benefits and alternatives for the proposed anesthesia with the patient or authorized representative who has indicated his/her understanding and acceptance.     Dental advisory given  Plan Discussed with: CRNA and Surgeon  Anesthesia Plan Comments:         Anesthesia Quick Evaluation

## 2020-10-14 NOTE — Discharge Instructions (Signed)
°Dr. Cyrah Mclamb °Joint Replacement Specialist °[facility] °[address] °[place], [place] [address] °[phone] ° ° °TOTAL HIP REPLACEMENT POSTOPERATIVE DIRECTIONS ° ° ° °Hip Rehabilitation, Guidelines Following Surgery  ° °WEIGHT BEARING °Weight bearing as tolerated with assist device (walker, cane, etc) as directed, use it as long as suggested by your surgeon or therapist, typically at least 4-6 weeks. ° °The results of a hip operation are greatly improved after range of motion and muscle strengthening exercises. Follow all safety measures which are given to protect your hip. If any of these exercises cause increased pain or swelling in your joint, decrease the amount until you are comfortable again. Then slowly increase the exercises. Call your caregiver if you have problems or questions.  ° °HOME CARE INSTRUCTIONS  °Most of the following instructions are designed to prevent the dislocation of your new hip.  °Remove items at home which could result in a fall. This includes throw rugs or furniture in walking pathways.  °Continue medications as instructed at time of discharge. °· You may have some home medications which will be placed on hold until you complete the course of blood thinner medication. °· You may start showering once you are discharged home. Do not remove your dressing. °Do not put on socks or shoes without following the instructions of your caregivers.   °Sit on chairs with arms. Use the chair arms to help push yourself up when arising.  °Arrange for the use of a toilet seat elevator so you are not sitting low.  °· Walk with walker as instructed.  °You may resume a sexual relationship in one month or when given the OK by your caregiver.  °Use walker as long as suggested by your caregivers.  °You may put full weight on your legs and walk as much as is comfortable. °Avoid periods of inactivity such as sitting longer than an hour when not asleep. This helps prevent  blood clots.  °You may return to work once you are cleared by your surgeon.  °Do not drive a car for 6 weeks or until released by your surgeon.  °Do not drive while taking narcotics.  °Wear elastic stockings for two weeks following surgery during the day but you may remove then at night.  °Make sure you keep all of your appointments after your operation with all of your doctors and caregivers. You should call the office at the above phone number and make an appointment for approximately two weeks after the date of your surgery. °Please pick up a stool softener and laxative for home use as long as you are requiring pain medications. °· ICE to the affected hip every three hours for 30 minutes at a time and then as needed for pain and swelling. Continue to use ice on the hip for pain and swelling from surgery. You may notice swelling that will progress down to the foot and ankle.  This is normal after surgery.  Elevate the leg when you are not up walking on it.   °It is important for you to complete the blood thinner medication as prescribed by your doctor. °· Continue to use the breathing machine which will help keep your temperature down.  It is common for your temperature to cycle up and down following surgery, especially at night when you are not up moving around and exerting yourself.  The breathing machine keeps your lungs expanded and your temperature down. ° °RANGE OF MOTION AND STRENGTHENING EXERCISES  °These exercises are   designed to help you keep full movement of your hip joint. Follow your caregiver's or physical therapist's instructions. Perform all exercises about fifteen times, three times per day or as directed. Exercise both hips, even if you have had only one joint replacement. These exercises can be done on a training (exercise) mat, on the floor, on a table or on a bed. Use whatever works the best and is most comfortable for you. Use music or television while you are exercising so that the exercises  are a pleasant break in your day. This will make your life better with the exercises acting as a break in routine you can look forward to.  °Lying on your back, slowly slide your foot toward your buttocks, raising your knee up off the floor. Then slowly slide your foot back down until your leg is straight again.  °Lying on your back spread your legs as far apart as you can without causing discomfort.  °Lying on your side, raise your upper leg and foot straight up from the floor as far as is comfortable. Slowly lower the leg and repeat.  °Lying on your back, tighten up the muscle in the front of your thigh (quadriceps muscles). You can do this by keeping your leg straight and trying to raise your heel off the floor. This helps strengthen the largest muscle supporting your knee.  °Lying on your back, tighten up the muscles of your buttocks both with the legs straight and with the knee bent at a comfortable angle while keeping your heel on the floor.  ° °SKILLED REHAB INSTRUCTIONS: °If the patient is transferred to a skilled rehab facility following release from the hospital, a list of the current medications will be sent to the facility for the patient to continue.  When discharged from the skilled rehab facility, please have the facility set up the patient's Home Health Physical Therapy prior to being released. Also, the skilled facility will be responsible for providing the patient with their medications at time of release from the facility to include their pain medication and their blood thinner medication. If the patient is still at the rehab facility at time of the two week follow up appointment, the skilled rehab facility will also need to assist the patient in arranging follow up appointment in our office and any transportation needs. ° °MAKE SURE YOU:  °Understand these instructions.  °Will watch your condition.  °Will get help right away if you are not doing well or get worse. ° °Pick up stool softner and  laxative for home use following surgery while on pain medications. °Do not remove your dressing. °The dressing is waterproof--it is OK to take showers. °Continue to use ice for pain and swelling after surgery. °Do not use any lotions or creams on the incision until instructed by your surgeon. °Total Hip Protocol. ° ° °

## 2020-10-14 NOTE — Transfer of Care (Signed)
Immediate Anesthesia Transfer of Care Note  Patient: Bryan Huffman  Procedure(s) Performed: TOTAL HIP ARTHROPLASTY ANTERIOR APPROACH (Left Hip)  Patient Location: PACU  Anesthesia Type:General  Level of Consciousness: alert  and drowsy  Airway & Oxygen Therapy: Patient Spontanous Breathing and Patient connected to face mask oxygen  Post-op Assessment: Report given to RN and Post -op Vital signs reviewed and stable  Post vital signs: Reviewed and stable  Last Vitals:  Vitals Value Taken Time  BP 159/90 10/14/20 1015  Temp 37.1 C 10/14/20 1009  Pulse 78 10/14/20 1018  Resp 13 10/14/20 1018  SpO2 98 % 10/14/20 1018  Vitals shown include unvalidated device data.  Last Pain:  Vitals:   10/14/20 1009  TempSrc:   PainSc: Asleep         Complications: No complications documented.

## 2020-10-14 NOTE — Plan of Care (Signed)
  Problem: Clinical Measurements: Goal: Respiratory complications will improve Outcome: Progressing   Problem: Clinical Measurements: Goal: Cardiovascular complication will be avoided Outcome: Progressing   Problem: Coping: Goal: Level of anxiety will decrease Outcome: Progressing   Problem: Safety: Goal: Ability to remain free from injury will improve Outcome: Progressing   Problem: Pain Managment: Goal: General experience of comfort will improve Outcome: Progressing

## 2020-10-15 ENCOUNTER — Encounter (HOSPITAL_COMMUNITY): Payer: Self-pay | Admitting: Orthopedic Surgery

## 2020-10-15 DIAGNOSIS — M1612 Unilateral primary osteoarthritis, left hip: Secondary | ICD-10-CM | POA: Diagnosis not present

## 2020-10-15 DIAGNOSIS — Z91030 Bee allergy status: Secondary | ICD-10-CM | POA: Diagnosis not present

## 2020-10-15 DIAGNOSIS — Z888 Allergy status to other drugs, medicaments and biological substances status: Secondary | ICD-10-CM | POA: Diagnosis not present

## 2020-10-15 DIAGNOSIS — Z79899 Other long term (current) drug therapy: Secondary | ICD-10-CM | POA: Diagnosis not present

## 2020-10-15 LAB — BASIC METABOLIC PANEL
Anion gap: 8 (ref 5–15)
BUN: 19 mg/dL (ref 6–20)
CO2: 26 mmol/L (ref 22–32)
Calcium: 8.2 mg/dL — ABNORMAL LOW (ref 8.9–10.3)
Chloride: 102 mmol/L (ref 98–111)
Creatinine, Ser: 0.83 mg/dL (ref 0.61–1.24)
GFR, Estimated: >60 mL/min (ref >60)
Glucose, Bld: 118 mg/dL — ABNORMAL HIGH (ref 70–99)
Potassium: 4.2 mmol/L (ref 3.5–5.1)
Sodium: 136 mmol/L (ref 135–145)

## 2020-10-15 LAB — CBC
HCT: 32.5 % — ABNORMAL LOW (ref 39.0–52.0)
Hemoglobin: 10.6 g/dL — ABNORMAL LOW (ref 13.0–17.0)
MCH: 28.7 pg (ref 26.0–34.0)
MCHC: 32.6 g/dL (ref 30.0–36.0)
MCV: 88.1 fL (ref 80.0–100.0)
Platelets: 202 10*3/uL (ref 150–400)
RBC: 3.69 MIL/uL — ABNORMAL LOW (ref 4.22–5.81)
RDW: 15.6 % — ABNORMAL HIGH (ref 11.5–15.5)
WBC: 11.9 10*3/uL — ABNORMAL HIGH (ref 4.0–10.5)
nRBC: 0 % (ref 0.0–0.2)

## 2020-10-15 MED ORDER — DOCUSATE SODIUM 100 MG PO CAPS: 100.0000 mg | ORAL_CAPSULE | Freq: Two times a day (BID) | ORAL | 0 refills | Status: DC

## 2020-10-15 MED ORDER — SENNA 8.6 MG PO TABS: 1.0000 | ORAL_TABLET | Freq: Two times a day (BID) | ORAL | 0 refills | Status: DC

## 2020-10-15 MED ORDER — HYDROMORPHONE HCL 2 MG PO TABS: 4.0000 mg | ORAL_TABLET | ORAL | 0 refills | Status: DC | PRN

## 2020-10-15 MED ORDER — ASPIRIN 81 MG PO CHEW: 81.0000 mg | CHEWABLE_TABLET | Freq: Two times a day (BID) | ORAL | 0 refills | Status: AC

## 2020-10-15 MED ORDER — ONDANSETRON HCL 4 MG PO TABS: 4.0000 mg | ORAL_TABLET | Freq: Four times a day (QID) | ORAL | 0 refills | Status: DC | PRN

## 2020-10-15 NOTE — Progress Notes (Signed)
    Subjective:  Patient reports pain as mild to moderate.  Denies N/V/CP/SOB.   Objective:   VITALS:   Vitals:   10/14/20 2013 10/14/20 2206 10/15/20 0148 10/15/20 0654  BP:  (!) 144/91 130/74 (!) 157/90  Pulse: 71 (!) 59 (!) 57 66  Resp:  16 17 18   Temp:  97.6 F (36.4 C) 98.4 F (36.9 C) 98.3 F (36.8 C)  TempSrc:  Oral Oral Oral  SpO2: 95% 100% 96% 99%  Weight:      Height:        NAD ABD soft Neurovascular intact Sensation intact distally Intact pulses distally Dorsiflexion/Plantar flexion intact Incision: dressing C/D/I   Lab Results  Component Value Date   WBC 11.9 (H) 10/15/2020   HGB 10.6 (L) 10/15/2020   HCT 32.5 (L) 10/15/2020   MCV 88.1 10/15/2020   PLT 202 10/15/2020   BMET    Component Value Date/Time   NA 136 10/15/2020 0347   NA 137 08/04/2014 1257   K 4.2 10/15/2020 0347   K 4.4 08/04/2014 1257   CL 102 10/15/2020 0347   CL 103 08/04/2014 1257   CO2 26 10/15/2020 0347   CO2 31 08/04/2014 1257   GLUCOSE 118 (H) 10/15/2020 0347   GLUCOSE 93 08/04/2014 1257   BUN 19 10/15/2020 0347   BUN 26 (H) 08/04/2014 1257   CREATININE 0.83 10/15/2020 0347   CREATININE 0.75 11/05/2014 1137   CALCIUM 8.2 (L) 10/15/2020 0347   CALCIUM 8.5 08/04/2014 1257   GFRNONAA >60 10/15/2020 0347   GFRNONAA >60 08/04/2014 1257   GFRAA 47 (L) 04/22/2020 2214   GFRAA >60 08/04/2014 1257     Assessment/Plan: 1 Day Post-Op   Principal Problem:   Osteoarthritis of left hip   WBAT with walker DVT ppx: Aspirin, SCDs, TEDS PO pain control PT/OT Dispo: D/C home once cleared by therapy     08/06/2014 10/15/2020, 9:41 AM [facility] is now [facility] [address], [place], [place] Phone: [phone] [email or web address] Facebook  [phone]

## 2020-10-15 NOTE — Progress Notes (Signed)
Physical Therapy Treatment Patient Details Name: Bryan Huffman MRN: [id number] DOB: [birth year] Today's Date: 10/15/2020    History of Present Illness Pt s/p LTHR and with hx of multiple back surgeries    PT Comments    Pt progressing well with mobility and eager for dc home.  Pt up to ambulate in hall, negotiated stairs, reviewed car transfers and performed HEP with assist - written instruction provided and reviewed.   Follow Up Recommendations  Follow surgeon's recommendation for DC plan and follow-up therapies     Equipment Recommendations  None recommended by PT    Recommendations for Other Services       Precautions / Restrictions Precautions Precautions: Fall Restrictions Weight Bearing Restrictions: No Other Position/Activity Restrictions: WBAT    Mobility  Bed Mobility Overal bed mobility: Modified Independent             General bed mobility comments: min cues for technique - no physical assist  Transfers Overall transfer level: Needs assistance Equipment used: Rolling walker (2 wheeled) Transfers: Sit to/from Stand Sit to Stand: Supervision         General transfer comment: cues for LE management and use of UEs to self assist  Ambulation/Gait Ambulation/Gait assistance: Supervision Gait Distance (Feet): 150 Feet Assistive device: Rolling walker (2 wheeled) Gait Pattern/deviations: Step-to pattern;Step-through pattern;Decreased step length - right;Decreased step length - left;Shuffle;Trunk flexed     General Gait Details: min cues for posture, position from RW and initial sequence   Stairs Stairs: Yes Stairs assistance: Min guard Stair Management: No rails;One rail Left;Step to pattern;Forwards;Backwards;With walker;With cane Number of Stairs: 4 General stair comments: 2 steps fwd with cane and rail; 2 steps bkwd with RW; cues for sequence and foot/RW/cane placement   Wheelchair Mobility    Modified Rankin (Stroke Patients Only)        Balance Overall balance assessment: Mild deficits observed, not formally tested                                          Cognition Arousal/Alertness: Awake/alert Behavior During Therapy: WFL for tasks assessed/performed Overall Cognitive Status: Within Functional Limits for tasks assessed                                        Exercises Total Joint Exercises Ankle Circles/Pumps: AROM;Both;15 reps;Supine Quad Sets: AROM;Both;10 reps;Supine Heel Slides: AAROM;20 reps;Supine;Left Hip ABduction/ADduction: AAROM;15 reps;Supine;Left Long Arc Quad: AROM;Left;10 reps;Seated    General Comments        Pertinent Vitals/Pain Pain Assessment: 0-10 Pain Score: 5  Pain Location: L hip Pain Descriptors / Indicators: Aching;Sore Pain Intervention(s): Limited activity within patient's tolerance;Monitored during session;Premedicated before session    Home Living                      Prior Function            PT Goals (current goals can now be found in the care plan section) Acute Rehab PT Goals Patient Stated Goal: Regain IND PT Goal Formulation: With patient Time For Goal Achievement: 10/22/20 Potential to Achieve Goals: Good Progress towards PT goals: Progressing toward goals    Frequency    7X/week      PT Plan Current plan remains appropriate    Co-evaluation  AM-PAC PT "6 Clicks" Mobility   Outcome Measure  Help needed turning from your back to your side while in a flat bed without using bedrails?: A Little Help needed moving from lying on your back to sitting on the side of a flat bed without using bedrails?: A Little Help needed moving to and from a bed to a chair (including a wheelchair)?: A Little Help needed standing up from a chair using your arms (e.g., wheelchair or bedside chair)?: A Little Help needed to walk in hospital room?: A Little Help needed climbing 3-5 steps with a railing? : A  Little 6 Click Score: 18    End of Session Equipment Utilized During Treatment: Gait belt Activity Tolerance: Patient tolerated treatment well Patient left: in chair;with call bell/phone within reach Nurse Communication: Mobility status PT Visit Diagnosis: Difficulty in walking, not elsewhere classified (R26.2)     Time: 1340-1410 PT Time Calculation (min) (ACUTE ONLY): 30 min  Charges:  $Gait Training: 8-22 mins $Therapeutic Exercise: 8-22 mins                     Mauro Kaufmann PT Acute Rehabilitation Services Pager [phone] Office [phone]    Bryan Huffman 10/15/2020, 3:04 PM

## 2020-10-15 NOTE — Evaluation (Signed)
Physical Therapy Evaluation Patient Details Name: Bryan Huffman MRN: [id number] DOB: 12/12/1970 Today's Date: 10/15/2020   History of Present Illness  Pt s/p LTHR and with hx of multiple back surgeries  Clinical Impression  Pt s/p L THR and presents with decreased L LE strength/ROM and post op pain limiting functional mobility.  Pt should progress to dc home with family assist.    Follow Up Recommendations Follow surgeon's recommendation for DC plan and follow-up therapies    Equipment Recommendations  None recommended by PT    Recommendations for Other Services       Precautions / Restrictions Precautions Precautions: Fall Restrictions Weight Bearing Restrictions: No Other Position/Activity Restrictions: WBAT      Mobility  Bed Mobility               General bed mobility comments: Pt sitting up and requests back to same    Transfers Overall transfer level: Needs assistance Equipment used: Rolling walker (2 wheeled) Transfers: Sit to/from Stand Sit to Stand: Min assist;Min guard         General transfer comment: cues for LE management and use of UEs to self assist  Ambulation/Gait Ambulation/Gait assistance: Min guard Gait Distance (Feet): 120 Feet Assistive device: Rolling walker (2 wheeled) Gait Pattern/deviations: Step-to pattern;Step-through pattern;Decreased step length - right;Decreased step length - left;Shuffle;Trunk flexed     General Gait Details: cues for posture, position from RW and initial sequence  Stairs            Wheelchair Mobility    Modified Rankin (Stroke Patients Only)       Balance Overall balance assessment: Mild deficits observed, not formally tested                                           Pertinent Vitals/Pain Pain Assessment: 0-10 Pain Score: 6  Pain Location: L hip Pain Descriptors / Indicators: Aching;Grimacing;Sore Pain Intervention(s): Limited activity within patient's  tolerance;Monitored during session;Premedicated before session;Ice applied    Home Living Family/patient expects to be discharged to:: Private residence Living Arrangements: Alone;Children Available Help at Discharge: Family Type of Home: House Home Access: Stairs to enter Entrance Stairs-Rails: None Entrance Stairs-Number of Steps: 4 Home Layout: One level Home Equipment: Adaptive equipment;Walker - 2 wheels Additional Comments: above refers to parents home where pt plans to stay initially    Prior Function Level of Independence: Independent               Hand Dominance   Dominant Hand: Right    Extremity/Trunk Assessment   Upper Extremity Assessment Upper Extremity Assessment: Overall WFL for tasks assessed    Lower Extremity Assessment Lower Extremity Assessment: LLE deficits/detail LLE Deficits / Details: 2/5 strength at hip with AAROM at hip to 80 flex and 15 abd    Cervical / Trunk Assessment Cervical / Trunk Assessment: Normal  Communication   Communication: No difficulties  Cognition Arousal/Alertness: Awake/alert Behavior During Therapy: WFL for tasks assessed/performed Overall Cognitive Status: Within Functional Limits for tasks assessed                                        General Comments      Exercises Total Joint Exercises Ankle Circles/Pumps: AROM;Both;15 reps;Supine Quad Sets: AROM;Both;10 reps;Supine Heel Slides: AAROM;Right;20 reps;Supine Hip  ABduction/ADduction: AAROM;Right;15 reps;Supine   Assessment/Plan    PT Assessment Patient needs continued PT services  PT Problem List Decreased strength;Decreased range of motion;Decreased activity tolerance;Decreased balance;Pain;Decreased knowledge of use of DME;Decreased mobility       PT Treatment Interventions DME instruction;Gait training;Stair training;Functional mobility training;Therapeutic activities;Therapeutic exercise;Patient/family education    PT Goals  (Current goals can be found in the Care Plan section)  Acute Rehab PT Goals Patient Stated Goal: Regain IND PT Goal Formulation: With patient Time For Goal Achievement: 10/22/20 Potential to Achieve Goals: Good    Frequency 7X/week   Barriers to discharge        Co-evaluation               AM-PAC PT "6 Clicks" Mobility  Outcome Measure Help needed turning from your back to your side while in a flat bed without using bedrails?: A Little Help needed moving from lying on your back to sitting on the side of a flat bed without using bedrails?: A Little Help needed moving to and from a bed to a chair (including a wheelchair)?: A Little Help needed standing up from a chair using your arms (e.g., wheelchair or bedside chair)?: A Little Help needed to walk in hospital room?: A Little Help needed climbing 3-5 steps with a railing? : A Lot 6 Click Score: 17    End of Session Equipment Utilized During Treatment: Gait belt Activity Tolerance: Patient tolerated treatment well Patient left: in chair;with call bell/phone within reach Nurse Communication: Mobility status PT Visit Diagnosis: Difficulty in walking, not elsewhere classified (R26.2)    Time: 4010-2725 PT Time Calculation (min) (ACUTE ONLY): 39 min   Charges:   PT Evaluation $PT Eval Low Complexity: 1 Low PT Treatments $Gait Training: 8-22 mins $Therapeutic Exercise: 8-22 mins        Mauro Kaufmann PT Acute Rehabilitation Services Pager [phone] Office [phone]   Bryan Huffman 10/15/2020, 10:04 AM

## 2020-10-15 NOTE — Discharge Summary (Signed)
Physician Discharge Summary  Patient ID: Bryan Huffman MRN: [id number] DOB/AGE: 11/15/1970 50 y.o.  Admit date: 10/14/2020 Discharge date: 10/15/2020  Admission Diagnoses:  Osteoarthritis of left hip  Discharge Diagnoses:  Principal Problem:   Osteoarthritis of left hip   Past Medical History:  Diagnosis Date  . Anxiety   . Arthritis   . Back pain   . Depression   . GERD (gastroesophageal reflux disease)   . Hemorrhoid   . Hypertension   . Insomnia   . OSA (obstructive sleep apnea)    wears cpap    Surgeries: Procedure(s): TOTAL HIP ARTHROPLASTY ANTERIOR APPROACH on 10/14/2020   Consultants (if any):   Discharged Condition: Improved  Hospital Course: Bryan Huffman is an 50 y.o. male who was admitted 10/14/2020 with a diagnosis of Osteoarthritis of left hip and went to the operating room on 10/14/2020 and underwent the above named procedures.    He was given perioperative antibiotics:  Anti-infectives (From admission, onward)   Start     Dose/Rate Route Frequency Ordered Stop   10/14/20 1500  ceFAZolin (ANCEF) IVPB 2g/100 mL premix        2 g 200 mL/hr over 30 Minutes Intravenous Every 6 hours 10/14/20 1415 10/14/20 2216   10/14/20 0600  ceFAZolin (ANCEF) IVPB 2g/100 mL premix        2 g 200 mL/hr over 30 Minutes Intravenous On call to O.R. 10/14/20 8185 10/14/20 0758    .  He was given sequential compression devices, early ambulation, and aspirin for DVT prophylaxis.  He benefited maximally from the hospital stay and there were no complications.    Recent vital signs:  Vitals:   10/15/20 0148 10/15/20 0654  BP: 130/74 (!) 157/90  Pulse: (!) 57 66  Resp: 17 18  Temp: 98.4 F (36.9 C) 98.3 F (36.8 C)  SpO2: 96% 99%    Recent laboratory studies:  Lab Results  Component Value Date   HGB 10.6 (L) 10/15/2020   HGB 13.5 10/05/2020   HGB 13.8 04/22/2020   Lab Results  Component Value Date   WBC 11.9 (H) 10/15/2020   PLT 202 10/15/2020   Lab Results   Component Value Date   INR 1.1 10/05/2020   Lab Results  Component Value Date   NA 136 10/15/2020   K 4.2 10/15/2020   CL 102 10/15/2020   CO2 26 10/15/2020   BUN 19 10/15/2020   CREATININE 0.83 10/15/2020   GLUCOSE 118 (H) 10/15/2020    Discharge Medications:   Allergies as of 10/15/2020      Reactions   Bee Venom Anaphylaxis, Swelling   Chlorthalidone    Dehydrated/sweating/ passed out   Statins    Muscle pain      Medication List    TAKE these medications   albuterol 108 (90 Base) MCG/ACT inhaler Commonly known as: VENTOLIN HFA Inhale 2 puffs into the lungs every 6 (six) hours as needed for wheezing or shortness of breath.   APPLE CIDER VINEGAR PO Take 4 capsules by mouth daily.   aspirin 81 MG chewable tablet Chew 1 tablet (81 mg total) by mouth 2 (two) times daily.   baclofen 20 MG tablet Commonly known as: LIORESAL Take 20 mg by mouth 2 (two) times daily.   Belbuca 900 MCG Film Generic drug: Buprenorphine HCl Place 900 mcg inside cheek 2 (two) times daily.   Dexilant 60 MG capsule Generic drug: dexlansoprazole Take 60 mg by mouth daily.   docusate sodium 100  MG capsule Commonly known as: COLACE Take 1 capsule (100 mg total) by mouth 2 (two) times daily.   ergocalciferol 1.25 MG (50000 UT) capsule Commonly known as: VITAMIN D2 Take 50,000 Units by mouth every Monday.   HYDROmorphone 2 MG tablet Commonly known as: DILAUDID Take 2 tablets (4 mg total) by mouth every 4 (four) hours as needed for severe pain.   ibuprofen 200 MG tablet Commonly known as: ADVIL Take 800 mg by mouth 3 (three) times daily as needed for moderate pain.   lisinopril 10 MG tablet Commonly known as: ZESTRIL Take 2 tablets (20 mg total) by mouth daily. What changed: how much to take   metoprolol succinate 100 MG 24 hr tablet Commonly known as: TOPROL-XL Take 1 tablet (100 mg total) by mouth daily. Take with or immediately following a meal.   ondansetron 4 MG  tablet Commonly known as: ZOFRAN Take 1 tablet (4 mg total) by mouth every 6 (six) hours as needed for nausea.   oxyCODONE-acetaminophen 10-325 MG tablet Commonly known as: PERCOCET Take 1 tablet by mouth 4 (four) times daily.   oxymetazoline 0.05 % nasal spray Commonly known as: AFRIN Place 3 sprays into both nostrils at bedtime as needed for congestion.   pregabalin 150 MG capsule Commonly known as: LYRICA Take 150 mg by mouth 2 (two) times daily.   senna 8.6 MG Tabs tablet Commonly known as: SENOKOT Take 1 tablet (8.6 mg total) by mouth 2 (two) times daily.   Vascepa 0.5 g Caps Generic drug: Icosapent Ethyl Take 1 g by mouth 2 (two) times daily.       Diagnostic Studies: DG Pelvis Portable  Result Date: 10/14/2020 CLINICAL DATA:  Post LEFT total hip arthroplasty EXAM: PORTABLE PELVIS 1-2 VIEWS COMPARISON:  Portable exam 1037 hours compared intraoperative images of 10/14/2020 FINDINGS: LEFT hip prosthesis identified in expected position. No acute fracture or dislocation identified on single AP view. IMPRESSION: LEFT hip prosthesis without acute complication. Electronically Signed   By: Ulyses Southward M.D.   On: 10/14/2020 10:46   DG C-Arm 1-60 Min-No Report  Result Date: 10/14/2020 Fluoroscopy was utilized by the requesting physician.  No radiographic interpretation.   DG HIP OPERATIVE UNILAT W OR W/O PELVIS LEFT  Result Date: 10/14/2020 CLINICAL DATA:  Intraoperative imaging for left hip replacement. EXAM: OPERATIVE LEFT HIP (WITH PELVIS IF PERFORMED) 4 VIEWS TECHNIQUE: Fluoroscopic spot image(s) were submitted for interpretation post-operatively. COMPARISON:  None. FINDINGS: Provided images demonstrate placement of a total hip arthroplasty. No abnormality is seen. IMPRESSION: Intraoperative imaging for right hip replacement.  No acute finding. Electronically Signed   By: Drusilla Kanner M.D.   On: 10/14/2020 10:07    Disposition: Discharge disposition: 01-Home or Self  Care       Discharge Instructions    Call MD / Call 911   Complete by: As directed    If you experience chest pain or shortness of breath, CALL 911 and be transported to the hospital emergency room.  If you develope a fever above 101 F, pus (white drainage) or increased drainage or redness at the wound, or calf pain, call your surgeon's office.   Call MD / Call 911   Complete by: As directed    If you experience chest pain or shortness of breath, CALL 911 and be transported to the hospital emergency room.  If you develope a fever above 101 F, pus (white drainage) or increased drainage or redness at the wound, or calf pain, call your  surgeon's office.   Constipation Prevention   Complete by: As directed    Drink plenty of fluids.  Prune juice may be helpful.  You may use a stool softener, such as Colace (over the counter) 100 mg twice a day.  Use MiraLax (over the counter) for constipation as needed.   Constipation Prevention   Complete by: As directed    Drink plenty of fluids.  Prune juice may be helpful.  You may use a stool softener, such as Colace (over the counter) 100 mg twice a day.  Use MiraLax (over the counter) for constipation as needed.   Diet - low sodium heart healthy   Complete by: As directed    Do not put a pillow under the knee. Place it under the heel.   Complete by: As directed    Driving restrictions   Complete by: As directed    No driving for 6 weeks   Driving restrictions   Complete by: As directed    No driving for 6 weeks   Increase activity slowly as tolerated   Complete by: As directed    Increase activity slowly as tolerated   Complete by: As directed    Lifting restrictions   Complete by: As directed    No lifting for 6 weeks   Lifting restrictions   Complete by: As directed    No lifting for 6 weeks   TED hose   Complete by: As directed    Use stockings (TED hose) for 6 weeks on both leg(s).  You may remove them at night for sleeping.   TED  hose   Complete by: As directed    Use stockings (TED hose) for 2 weeks on both leg(s).  You may remove them at night for sleeping.       Follow-up Information    Swinteck, Arlys John, MD. Schedule an appointment as soon as possible for a visit in 2 weeks.   Specialty: Orthopedic Surgery Why: For wound re-check, For suture removal Contact information: [address] [place] [address] [phone]                Signed: Darrick Grinder 10/15/2020, 10:03 AM

## 2020-10-15 NOTE — TOC Transition Note (Signed)
Transition of Care [facility]) - CM/SW Discharge Note   Patient Details  Name: Bryan Huffman MRN: [id number] Date of Birth: January 07, 1971  [facility] CM/SW Contact:  Clearance Coots, LCSW Phone Number: 10/15/2020, 10:14 AM   Clinical Narrative:    Therapy Plan: Home (HEP) Patient confirm with staff he has DME   Final next level of care: Home/Self Care Barriers to Discharge: No Barriers Identified   Patient Goals and CMS Choice        Discharge Placement                       Discharge Plan and Services                                     Social Determinants of Health (SDOH) Interventions     Readmission Risk Interventions No flowsheet data found.

## 2020-10-15 NOTE — Progress Notes (Signed)
Patient discharged to home w/ family. Given all belongings, instructions. Verbalized understanding. Escorted to pov via w/c. ?

## 2021-01-05 DIAGNOSIS — F17200 Nicotine dependence, unspecified, uncomplicated: Secondary | ICD-10-CM | POA: Diagnosis not present

## 2021-01-05 DIAGNOSIS — G4700 Insomnia, unspecified: Secondary | ICD-10-CM | POA: Diagnosis not present

## 2021-01-05 DIAGNOSIS — G894 Chronic pain syndrome: Secondary | ICD-10-CM | POA: Diagnosis not present

## 2021-01-05 DIAGNOSIS — F17210 Nicotine dependence, cigarettes, uncomplicated: Secondary | ICD-10-CM | POA: Diagnosis not present

## 2021-01-05 DIAGNOSIS — Z79899 Other long term (current) drug therapy: Secondary | ICD-10-CM | POA: Diagnosis not present

## 2021-02-03 DIAGNOSIS — G894 Chronic pain syndrome: Secondary | ICD-10-CM | POA: Diagnosis not present

## 2021-02-03 DIAGNOSIS — F17200 Nicotine dependence, unspecified, uncomplicated: Secondary | ICD-10-CM | POA: Diagnosis not present

## 2021-02-03 DIAGNOSIS — Z79899 Other long term (current) drug therapy: Secondary | ICD-10-CM | POA: Diagnosis not present

## 2021-02-03 DIAGNOSIS — F17210 Nicotine dependence, cigarettes, uncomplicated: Secondary | ICD-10-CM | POA: Diagnosis not present

## 2021-02-14 DIAGNOSIS — Z96642 Presence of left artificial hip joint: Secondary | ICD-10-CM | POA: Diagnosis not present

## 2021-03-07 DIAGNOSIS — F17210 Nicotine dependence, cigarettes, uncomplicated: Secondary | ICD-10-CM | POA: Diagnosis not present

## 2021-03-07 DIAGNOSIS — Z79899 Other long term (current) drug therapy: Secondary | ICD-10-CM | POA: Diagnosis not present

## 2021-03-07 DIAGNOSIS — G894 Chronic pain syndrome: Secondary | ICD-10-CM | POA: Diagnosis not present

## 2021-03-07 DIAGNOSIS — F17200 Nicotine dependence, unspecified, uncomplicated: Secondary | ICD-10-CM | POA: Diagnosis not present

## 2021-03-07 DIAGNOSIS — G4700 Insomnia, unspecified: Secondary | ICD-10-CM | POA: Diagnosis not present

## 2021-04-07 DIAGNOSIS — Z79899 Other long term (current) drug therapy: Secondary | ICD-10-CM | POA: Diagnosis not present

## 2021-04-07 DIAGNOSIS — F17200 Nicotine dependence, unspecified, uncomplicated: Secondary | ICD-10-CM | POA: Diagnosis not present

## 2021-04-07 DIAGNOSIS — G4700 Insomnia, unspecified: Secondary | ICD-10-CM | POA: Diagnosis not present

## 2021-04-07 DIAGNOSIS — G894 Chronic pain syndrome: Secondary | ICD-10-CM | POA: Diagnosis not present

## 2021-04-07 DIAGNOSIS — F17210 Nicotine dependence, cigarettes, uncomplicated: Secondary | ICD-10-CM | POA: Diagnosis not present

## 2021-04-12 DIAGNOSIS — Z79899 Other long term (current) drug therapy: Secondary | ICD-10-CM | POA: Diagnosis not present

## 2021-05-09 DIAGNOSIS — Z79899 Other long term (current) drug therapy: Secondary | ICD-10-CM | POA: Diagnosis not present

## 2021-05-09 DIAGNOSIS — F17200 Nicotine dependence, unspecified, uncomplicated: Secondary | ICD-10-CM | POA: Diagnosis not present

## 2021-05-09 DIAGNOSIS — F17210 Nicotine dependence, cigarettes, uncomplicated: Secondary | ICD-10-CM | POA: Diagnosis not present

## 2021-05-09 DIAGNOSIS — G4700 Insomnia, unspecified: Secondary | ICD-10-CM | POA: Diagnosis not present

## 2021-05-09 DIAGNOSIS — G894 Chronic pain syndrome: Secondary | ICD-10-CM | POA: Diagnosis not present

## 2021-06-08 DIAGNOSIS — E119 Type 2 diabetes mellitus without complications: Secondary | ICD-10-CM | POA: Diagnosis not present

## 2021-06-08 DIAGNOSIS — G894 Chronic pain syndrome: Secondary | ICD-10-CM | POA: Diagnosis not present

## 2021-06-08 DIAGNOSIS — M539 Dorsopathy, unspecified: Secondary | ICD-10-CM | POA: Diagnosis not present

## 2021-06-08 DIAGNOSIS — Z79899 Other long term (current) drug therapy: Secondary | ICD-10-CM | POA: Diagnosis not present

## 2021-06-08 DIAGNOSIS — E1165 Type 2 diabetes mellitus with hyperglycemia: Secondary | ICD-10-CM | POA: Diagnosis not present

## 2021-06-09 DIAGNOSIS — Z79899 Other long term (current) drug therapy: Secondary | ICD-10-CM | POA: Diagnosis not present

## 2021-07-07 DIAGNOSIS — Z79899 Other long term (current) drug therapy: Secondary | ICD-10-CM | POA: Diagnosis not present

## 2021-07-07 DIAGNOSIS — E1165 Type 2 diabetes mellitus with hyperglycemia: Secondary | ICD-10-CM | POA: Diagnosis not present

## 2021-07-07 DIAGNOSIS — G894 Chronic pain syndrome: Secondary | ICD-10-CM | POA: Diagnosis not present

## 2021-07-07 DIAGNOSIS — G4700 Insomnia, unspecified: Secondary | ICD-10-CM | POA: Diagnosis not present

## 2021-07-07 DIAGNOSIS — M539 Dorsopathy, unspecified: Secondary | ICD-10-CM | POA: Diagnosis not present

## 2021-07-11 DIAGNOSIS — Z79899 Other long term (current) drug therapy: Secondary | ICD-10-CM | POA: Diagnosis not present

## 2021-07-26 DIAGNOSIS — Z1159 Encounter for screening for other viral diseases: Secondary | ICD-10-CM | POA: Diagnosis not present

## 2021-07-26 DIAGNOSIS — Z79899 Other long term (current) drug therapy: Secondary | ICD-10-CM | POA: Diagnosis not present

## 2021-07-26 DIAGNOSIS — R635 Abnormal weight gain: Secondary | ICD-10-CM | POA: Diagnosis not present

## 2021-07-26 DIAGNOSIS — E119 Type 2 diabetes mellitus without complications: Secondary | ICD-10-CM | POA: Diagnosis not present

## 2021-07-26 DIAGNOSIS — E8881 Metabolic syndrome: Secondary | ICD-10-CM | POA: Diagnosis not present

## 2021-07-26 DIAGNOSIS — D539 Nutritional anemia, unspecified: Secondary | ICD-10-CM | POA: Diagnosis not present

## 2021-07-26 DIAGNOSIS — I10 Essential (primary) hypertension: Secondary | ICD-10-CM | POA: Diagnosis not present

## 2021-07-26 DIAGNOSIS — E291 Testicular hypofunction: Secondary | ICD-10-CM | POA: Diagnosis not present

## 2021-07-26 DIAGNOSIS — F17210 Nicotine dependence, cigarettes, uncomplicated: Secondary | ICD-10-CM | POA: Diagnosis not present

## 2021-07-26 DIAGNOSIS — E559 Vitamin D deficiency, unspecified: Secondary | ICD-10-CM | POA: Diagnosis not present

## 2021-07-26 DIAGNOSIS — E7800 Pure hypercholesterolemia, unspecified: Secondary | ICD-10-CM | POA: Diagnosis not present

## 2021-07-26 DIAGNOSIS — R5383 Other fatigue: Secondary | ICD-10-CM | POA: Diagnosis not present

## 2021-08-08 DIAGNOSIS — M539 Dorsopathy, unspecified: Secondary | ICD-10-CM | POA: Diagnosis not present

## 2021-08-08 DIAGNOSIS — Z79899 Other long term (current) drug therapy: Secondary | ICD-10-CM | POA: Diagnosis not present

## 2021-08-08 DIAGNOSIS — E1165 Type 2 diabetes mellitus with hyperglycemia: Secondary | ICD-10-CM | POA: Diagnosis not present

## 2021-08-08 DIAGNOSIS — G894 Chronic pain syndrome: Secondary | ICD-10-CM | POA: Diagnosis not present

## 2021-08-08 DIAGNOSIS — G4700 Insomnia, unspecified: Secondary | ICD-10-CM | POA: Diagnosis not present

## 2021-08-10 DIAGNOSIS — Z79899 Other long term (current) drug therapy: Secondary | ICD-10-CM | POA: Diagnosis not present

## 2021-08-15 DIAGNOSIS — F17210 Nicotine dependence, cigarettes, uncomplicated: Secondary | ICD-10-CM | POA: Diagnosis not present

## 2021-08-15 DIAGNOSIS — E1165 Type 2 diabetes mellitus with hyperglycemia: Secondary | ICD-10-CM | POA: Diagnosis not present

## 2021-08-15 DIAGNOSIS — Z0000 Encounter for general adult medical examination without abnormal findings: Secondary | ICD-10-CM | POA: Diagnosis not present

## 2021-08-15 DIAGNOSIS — I10 Essential (primary) hypertension: Secondary | ICD-10-CM | POA: Diagnosis not present

## 2021-09-07 DIAGNOSIS — M545 Low back pain, unspecified: Secondary | ICD-10-CM | POA: Diagnosis not present

## 2021-09-07 DIAGNOSIS — G4700 Insomnia, unspecified: Secondary | ICD-10-CM | POA: Diagnosis not present

## 2021-09-07 DIAGNOSIS — Z79899 Other long term (current) drug therapy: Secondary | ICD-10-CM | POA: Diagnosis not present

## 2021-09-09 DIAGNOSIS — E119 Type 2 diabetes mellitus without complications: Secondary | ICD-10-CM | POA: Diagnosis not present

## 2021-09-09 DIAGNOSIS — E7800 Pure hypercholesterolemia, unspecified: Secondary | ICD-10-CM | POA: Diagnosis not present

## 2021-09-09 DIAGNOSIS — F17210 Nicotine dependence, cigarettes, uncomplicated: Secondary | ICD-10-CM | POA: Diagnosis not present

## 2021-09-09 DIAGNOSIS — I10 Essential (primary) hypertension: Secondary | ICD-10-CM | POA: Diagnosis not present

## 2021-09-09 DIAGNOSIS — Z79899 Other long term (current) drug therapy: Secondary | ICD-10-CM | POA: Diagnosis not present

## 2021-09-09 DIAGNOSIS — R635 Abnormal weight gain: Secondary | ICD-10-CM | POA: Diagnosis not present

## 2021-09-09 DIAGNOSIS — E8881 Metabolic syndrome: Secondary | ICD-10-CM | POA: Diagnosis not present

## 2021-09-14 DIAGNOSIS — R7989 Other specified abnormal findings of blood chemistry: Secondary | ICD-10-CM | POA: Diagnosis not present

## 2021-10-05 DIAGNOSIS — M79671 Pain in right foot: Secondary | ICD-10-CM | POA: Diagnosis not present

## 2021-10-05 DIAGNOSIS — R030 Elevated blood-pressure reading, without diagnosis of hypertension: Secondary | ICD-10-CM | POA: Diagnosis not present

## 2021-10-05 DIAGNOSIS — Z6841 Body Mass Index (BMI) 40.0 and over, adult: Secondary | ICD-10-CM | POA: Diagnosis not present

## 2021-10-11 DIAGNOSIS — R635 Abnormal weight gain: Secondary | ICD-10-CM | POA: Diagnosis not present

## 2021-10-11 DIAGNOSIS — E119 Type 2 diabetes mellitus without complications: Secondary | ICD-10-CM | POA: Diagnosis not present

## 2021-10-11 DIAGNOSIS — E7800 Pure hypercholesterolemia, unspecified: Secondary | ICD-10-CM | POA: Diagnosis not present

## 2021-10-11 DIAGNOSIS — I10 Essential (primary) hypertension: Secondary | ICD-10-CM | POA: Diagnosis not present

## 2021-10-11 DIAGNOSIS — E8881 Metabolic syndrome: Secondary | ICD-10-CM | POA: Diagnosis not present

## 2021-10-11 DIAGNOSIS — F17210 Nicotine dependence, cigarettes, uncomplicated: Secondary | ICD-10-CM | POA: Diagnosis not present

## 2021-10-12 DIAGNOSIS — G4733 Obstructive sleep apnea (adult) (pediatric): Secondary | ICD-10-CM | POA: Diagnosis not present

## 2021-10-12 DIAGNOSIS — I10 Essential (primary) hypertension: Secondary | ICD-10-CM | POA: Diagnosis not present

## 2021-10-12 DIAGNOSIS — K219 Gastro-esophageal reflux disease without esophagitis: Secondary | ICD-10-CM | POA: Diagnosis not present

## 2021-10-12 DIAGNOSIS — M539 Dorsopathy, unspecified: Secondary | ICD-10-CM | POA: Diagnosis not present

## 2021-10-12 DIAGNOSIS — Z6839 Body mass index (BMI) 39.0-39.9, adult: Secondary | ICD-10-CM | POA: Diagnosis not present

## 2021-11-11 DIAGNOSIS — I10 Essential (primary) hypertension: Secondary | ICD-10-CM | POA: Diagnosis not present

## 2021-11-11 DIAGNOSIS — E8881 Metabolic syndrome: Secondary | ICD-10-CM | POA: Diagnosis not present

## 2021-11-11 DIAGNOSIS — F17210 Nicotine dependence, cigarettes, uncomplicated: Secondary | ICD-10-CM | POA: Diagnosis not present

## 2021-11-11 DIAGNOSIS — E119 Type 2 diabetes mellitus without complications: Secondary | ICD-10-CM | POA: Diagnosis not present

## 2021-11-11 DIAGNOSIS — Z79899 Other long term (current) drug therapy: Secondary | ICD-10-CM | POA: Diagnosis not present

## 2021-11-11 DIAGNOSIS — M545 Low back pain, unspecified: Secondary | ICD-10-CM | POA: Diagnosis not present

## 2021-11-15 DIAGNOSIS — Z79899 Other long term (current) drug therapy: Secondary | ICD-10-CM | POA: Diagnosis not present

## 2021-11-22 DIAGNOSIS — G4700 Insomnia, unspecified: Secondary | ICD-10-CM | POA: Diagnosis not present

## 2021-11-22 DIAGNOSIS — E782 Mixed hyperlipidemia: Secondary | ICD-10-CM | POA: Diagnosis not present

## 2021-11-22 DIAGNOSIS — G4730 Sleep apnea, unspecified: Secondary | ICD-10-CM | POA: Diagnosis not present

## 2021-11-22 DIAGNOSIS — G4733 Obstructive sleep apnea (adult) (pediatric): Secondary | ICD-10-CM | POA: Diagnosis not present

## 2021-11-22 DIAGNOSIS — I10 Essential (primary) hypertension: Secondary | ICD-10-CM | POA: Diagnosis not present

## 2021-11-22 DIAGNOSIS — F17210 Nicotine dependence, cigarettes, uncomplicated: Secondary | ICD-10-CM | POA: Diagnosis not present

## 2021-12-09 DIAGNOSIS — I10 Essential (primary) hypertension: Secondary | ICD-10-CM | POA: Diagnosis not present

## 2021-12-09 DIAGNOSIS — E8881 Metabolic syndrome: Secondary | ICD-10-CM | POA: Diagnosis not present

## 2021-12-09 DIAGNOSIS — Z79899 Other long term (current) drug therapy: Secondary | ICD-10-CM | POA: Diagnosis not present

## 2021-12-09 DIAGNOSIS — E119 Type 2 diabetes mellitus without complications: Secondary | ICD-10-CM | POA: Diagnosis not present

## 2021-12-09 DIAGNOSIS — M545 Low back pain, unspecified: Secondary | ICD-10-CM | POA: Diagnosis not present

## 2021-12-09 DIAGNOSIS — F17210 Nicotine dependence, cigarettes, uncomplicated: Secondary | ICD-10-CM | POA: Diagnosis not present

## 2021-12-14 DIAGNOSIS — Z79899 Other long term (current) drug therapy: Secondary | ICD-10-CM | POA: Diagnosis not present

## 2021-12-20 DIAGNOSIS — I10 Essential (primary) hypertension: Secondary | ICD-10-CM | POA: Diagnosis not present

## 2021-12-20 DIAGNOSIS — G4733 Obstructive sleep apnea (adult) (pediatric): Secondary | ICD-10-CM | POA: Diagnosis not present

## 2021-12-20 DIAGNOSIS — Z6841 Body Mass Index (BMI) 40.0 and over, adult: Secondary | ICD-10-CM | POA: Diagnosis not present

## 2021-12-20 DIAGNOSIS — K219 Gastro-esophageal reflux disease without esophagitis: Secondary | ICD-10-CM | POA: Diagnosis not present

## 2021-12-20 DIAGNOSIS — F17210 Nicotine dependence, cigarettes, uncomplicated: Secondary | ICD-10-CM | POA: Diagnosis not present

## 2021-12-20 DIAGNOSIS — R0602 Shortness of breath: Secondary | ICD-10-CM | POA: Diagnosis not present

## 2021-12-20 DIAGNOSIS — G4700 Insomnia, unspecified: Secondary | ICD-10-CM | POA: Diagnosis not present

## 2021-12-26 ENCOUNTER — Other Ambulatory Visit: Payer: Self-pay

## 2021-12-26 ENCOUNTER — Encounter (HOSPITAL_COMMUNITY): Payer: Self-pay

## 2021-12-26 ENCOUNTER — Emergency Department (HOSPITAL_COMMUNITY): Payer: Medicaid Other

## 2021-12-26 ENCOUNTER — Emergency Department (HOSPITAL_COMMUNITY)
Admission: EM | Admit: 2021-12-26 | Discharge: 2021-12-26 | Disposition: A | Discharge status: Home / Self Care | Payer: Medicaid Other | Attending: Emergency Medicine | Admitting: Emergency Medicine

## 2021-12-26 DIAGNOSIS — M25521 Pain in right elbow: Secondary | ICD-10-CM | POA: Insufficient documentation

## 2021-12-26 DIAGNOSIS — S80211A Abrasion, right knee, initial encounter: Secondary | ICD-10-CM | POA: Diagnosis not present

## 2021-12-26 DIAGNOSIS — Z5321 Procedure and treatment not carried out due to patient leaving prior to being seen by health care provider: Secondary | ICD-10-CM | POA: Insufficient documentation

## 2021-12-26 DIAGNOSIS — M25571 Pain in right ankle and joints of right foot: Secondary | ICD-10-CM | POA: Diagnosis not present

## 2021-12-26 DIAGNOSIS — S0990XA Unspecified injury of head, initial encounter: Secondary | ICD-10-CM | POA: Diagnosis not present

## 2021-12-26 DIAGNOSIS — S299XXA Unspecified injury of thorax, initial encounter: Secondary | ICD-10-CM | POA: Diagnosis not present

## 2021-12-26 DIAGNOSIS — M545 Low back pain, unspecified: Secondary | ICD-10-CM | POA: Insufficient documentation

## 2021-12-26 DIAGNOSIS — Y92410 Unspecified street and highway as the place of occurrence of the external cause: Secondary | ICD-10-CM | POA: Insufficient documentation

## 2021-12-26 DIAGNOSIS — Y908 Blood alcohol level of 240 mg/100 ml or more: Secondary | ICD-10-CM | POA: Diagnosis not present

## 2021-12-26 DIAGNOSIS — M25471 Effusion, right ankle: Secondary | ICD-10-CM | POA: Diagnosis not present

## 2021-12-26 DIAGNOSIS — R600 Localized edema: Secondary | ICD-10-CM | POA: Diagnosis not present

## 2021-12-26 DIAGNOSIS — M549 Dorsalgia, unspecified: Secondary | ICD-10-CM | POA: Diagnosis not present

## 2021-12-26 DIAGNOSIS — S50311A Abrasion of right elbow, initial encounter: Secondary | ICD-10-CM | POA: Diagnosis not present

## 2021-12-26 DIAGNOSIS — I959 Hypotension, unspecified: Secondary | ICD-10-CM | POA: Diagnosis not present

## 2021-12-26 DIAGNOSIS — Z041 Encounter for examination and observation following transport accident: Secondary | ICD-10-CM | POA: Diagnosis not present

## 2021-12-26 LAB — CBC WITH DIFFERENTIAL/PLATELET
Abs Immature Granulocytes: 0.04 10*3/uL (ref 0.00–0.07)
Basophils Absolute: 0.1 10*3/uL (ref 0.0–0.1)
Basophils Relative: 1 %
Eosinophils Absolute: 0.1 10*3/uL (ref 0.0–0.5)
Eosinophils Relative: 1 %
HCT: 39.3 % (ref 39.0–52.0)
Hemoglobin: 12.5 g/dL — ABNORMAL LOW (ref 13.0–17.0)
Immature Granulocytes: 0 %
Lymphocytes Relative: 23 %
Lymphs Abs: 2.2 10*3/uL (ref 0.7–4.0)
MCH: 25.8 pg — ABNORMAL LOW (ref 26.0–34.0)
MCHC: 31.8 g/dL (ref 30.0–36.0)
MCV: 81 fL (ref 80.0–100.0)
Monocytes Absolute: 0.5 10*3/uL (ref 0.1–1.0)
Monocytes Relative: 6 %
Neutro Abs: 6.3 10*3/uL (ref 1.7–7.7)
Neutrophils Relative %: 69 %
Platelets: 337 10*3/uL (ref 150–400)
RBC: 4.85 MIL/uL (ref 4.22–5.81)
RDW: 16.4 % — ABNORMAL HIGH (ref 11.5–15.5)
WBC: 9.2 10*3/uL (ref 4.0–10.5)
nRBC: 0 % (ref 0.0–0.2)

## 2021-12-26 LAB — ETHANOL: Alcohol, Ethyl (B): 270 mg/dL — ABNORMAL HIGH (ref <10)

## 2021-12-26 LAB — COMPREHENSIVE METABOLIC PANEL
ALT: 46 U/L — ABNORMAL HIGH (ref 0–44)
AST: 61 U/L — ABNORMAL HIGH (ref 15–41)
Albumin: 3.7 g/dL (ref 3.5–5.0)
Alkaline Phosphatase: 79 U/L (ref 38–126)
Anion gap: 13 (ref 5–15)
BUN: 6 mg/dL (ref 6–20)
CO2: 25 mmol/L (ref 22–32)
Calcium: 8.6 mg/dL — ABNORMAL LOW (ref 8.9–10.3)
Chloride: 93 mmol/L — ABNORMAL LOW (ref 98–111)
Creatinine, Ser: 1.05 mg/dL (ref 0.61–1.24)
GFR, Estimated: >60 mL/min (ref >60)
Glucose, Bld: 117 mg/dL — ABNORMAL HIGH (ref 70–99)
Potassium: 3.3 mmol/L — ABNORMAL LOW (ref 3.5–5.1)
Sodium: 131 mmol/L — ABNORMAL LOW (ref 135–145)
Total Bilirubin: 0.4 mg/dL (ref 0.3–1.2)
Total Protein: 7.4 g/dL (ref 6.5–8.1)

## 2021-12-26 LAB — SAMPLE TO BLOOD BANK

## 2021-12-26 MED ORDER — TETANUS-DIPHTH-ACELL PERTUSSIS 5-2.5-18.5 LF-MCG/0.5 IM SUSY: 0.5000 mL | PREFILLED_SYRINGE | Freq: Once | INTRAMUSCULAR | Status: DC
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
## 2021-12-26 NOTE — ED Notes (Signed)
Pt left. Pt very irritated with wait time and wanted to go home, pt was advised to stay but decided to leave.  ?
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
## 2022-01-06 DIAGNOSIS — E119 Type 2 diabetes mellitus without complications: Secondary | ICD-10-CM | POA: Diagnosis not present

## 2022-01-06 DIAGNOSIS — E8881 Metabolic syndrome: Secondary | ICD-10-CM | POA: Diagnosis not present

## 2022-01-06 DIAGNOSIS — Z79899 Other long term (current) drug therapy: Secondary | ICD-10-CM | POA: Diagnosis not present

## 2022-01-06 DIAGNOSIS — I10 Essential (primary) hypertension: Secondary | ICD-10-CM | POA: Diagnosis not present

## 2022-01-06 DIAGNOSIS — M545 Low back pain, unspecified: Secondary | ICD-10-CM | POA: Diagnosis not present

## 2022-01-06 DIAGNOSIS — F17210 Nicotine dependence, cigarettes, uncomplicated: Secondary | ICD-10-CM | POA: Diagnosis not present

## 2022-01-07 DIAGNOSIS — G4733 Obstructive sleep apnea (adult) (pediatric): Secondary | ICD-10-CM | POA: Diagnosis not present

## 2022-01-10 DIAGNOSIS — Z79899 Other long term (current) drug therapy: Secondary | ICD-10-CM | POA: Diagnosis not present

## 2022-01-20 DIAGNOSIS — G4733 Obstructive sleep apnea (adult) (pediatric): Secondary | ICD-10-CM | POA: Diagnosis not present

## 2022-02-06 DIAGNOSIS — R030 Elevated blood-pressure reading, without diagnosis of hypertension: Secondary | ICD-10-CM | POA: Diagnosis not present

## 2022-02-06 DIAGNOSIS — I10 Essential (primary) hypertension: Secondary | ICD-10-CM | POA: Diagnosis not present

## 2022-02-06 DIAGNOSIS — E119 Type 2 diabetes mellitus without complications: Secondary | ICD-10-CM | POA: Diagnosis not present

## 2022-02-06 DIAGNOSIS — Z6841 Body Mass Index (BMI) 40.0 and over, adult: Secondary | ICD-10-CM | POA: Diagnosis not present

## 2022-02-06 DIAGNOSIS — G4733 Obstructive sleep apnea (adult) (pediatric): Secondary | ICD-10-CM | POA: Diagnosis not present

## 2022-02-06 DIAGNOSIS — Z79899 Other long term (current) drug therapy: Secondary | ICD-10-CM | POA: Diagnosis not present

## 2022-02-06 DIAGNOSIS — F17210 Nicotine dependence, cigarettes, uncomplicated: Secondary | ICD-10-CM | POA: Diagnosis not present

## 2022-02-06 DIAGNOSIS — Z125 Encounter for screening for malignant neoplasm of prostate: Secondary | ICD-10-CM | POA: Diagnosis not present

## 2022-02-06 DIAGNOSIS — E559 Vitamin D deficiency, unspecified: Secondary | ICD-10-CM | POA: Diagnosis not present

## 2022-02-09 DIAGNOSIS — Z79899 Other long term (current) drug therapy: Secondary | ICD-10-CM | POA: Diagnosis not present

## 2022-02-09 DIAGNOSIS — I10 Essential (primary) hypertension: Secondary | ICD-10-CM | POA: Diagnosis not present

## 2022-02-09 DIAGNOSIS — E8881 Metabolic syndrome: Secondary | ICD-10-CM | POA: Diagnosis not present

## 2022-02-09 DIAGNOSIS — M545 Low back pain, unspecified: Secondary | ICD-10-CM | POA: Diagnosis not present

## 2022-02-09 DIAGNOSIS — E119 Type 2 diabetes mellitus without complications: Secondary | ICD-10-CM | POA: Diagnosis not present

## 2022-02-09 DIAGNOSIS — F17210 Nicotine dependence, cigarettes, uncomplicated: Secondary | ICD-10-CM | POA: Diagnosis not present

## 2022-02-14 DIAGNOSIS — Z79899 Other long term (current) drug therapy: Secondary | ICD-10-CM | POA: Diagnosis not present

## 2022-02-16 DIAGNOSIS — Z122 Encounter for screening for malignant neoplasm of respiratory organs: Secondary | ICD-10-CM | POA: Diagnosis not present

## 2022-02-16 DIAGNOSIS — Z87891 Personal history of nicotine dependence: Secondary | ICD-10-CM | POA: Diagnosis not present

## 2022-02-19 DIAGNOSIS — G4733 Obstructive sleep apnea (adult) (pediatric): Secondary | ICD-10-CM | POA: Diagnosis not present

## 2022-02-22 DIAGNOSIS — I10 Essential (primary) hypertension: Secondary | ICD-10-CM | POA: Diagnosis not present

## 2022-02-22 DIAGNOSIS — R030 Elevated blood-pressure reading, without diagnosis of hypertension: Secondary | ICD-10-CM | POA: Diagnosis not present

## 2022-02-22 DIAGNOSIS — Z6841 Body Mass Index (BMI) 40.0 and over, adult: Secondary | ICD-10-CM | POA: Diagnosis not present

## 2022-02-22 DIAGNOSIS — Z789 Other specified health status: Secondary | ICD-10-CM | POA: Diagnosis not present

## 2022-02-22 DIAGNOSIS — G4733 Obstructive sleep apnea (adult) (pediatric): Secondary | ICD-10-CM | POA: Diagnosis not present

## 2022-02-22 DIAGNOSIS — Z0130 Encounter for examination of blood pressure without abnormal findings: Secondary | ICD-10-CM | POA: Diagnosis not present

## 2022-03-09 DIAGNOSIS — E119 Type 2 diabetes mellitus without complications: Secondary | ICD-10-CM | POA: Diagnosis not present

## 2022-03-09 DIAGNOSIS — Z79899 Other long term (current) drug therapy: Secondary | ICD-10-CM | POA: Diagnosis not present

## 2022-03-09 DIAGNOSIS — F17210 Nicotine dependence, cigarettes, uncomplicated: Secondary | ICD-10-CM | POA: Diagnosis not present

## 2022-03-09 DIAGNOSIS — M545 Low back pain, unspecified: Secondary | ICD-10-CM | POA: Diagnosis not present

## 2022-03-09 DIAGNOSIS — I10 Essential (primary) hypertension: Secondary | ICD-10-CM | POA: Diagnosis not present

## 2022-03-09 DIAGNOSIS — E8881 Metabolic syndrome: Secondary | ICD-10-CM | POA: Diagnosis not present

## 2022-03-11 DIAGNOSIS — Z79899 Other long term (current) drug therapy: Secondary | ICD-10-CM | POA: Diagnosis not present

## 2022-03-22 DIAGNOSIS — G4733 Obstructive sleep apnea (adult) (pediatric): Secondary | ICD-10-CM | POA: Diagnosis not present
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
## 2022-04-07 DIAGNOSIS — M545 Low back pain, unspecified: Secondary | ICD-10-CM | POA: Diagnosis not present

## 2022-04-07 DIAGNOSIS — F17210 Nicotine dependence, cigarettes, uncomplicated: Secondary | ICD-10-CM | POA: Diagnosis not present

## 2022-04-07 DIAGNOSIS — R030 Elevated blood-pressure reading, without diagnosis of hypertension: Secondary | ICD-10-CM | POA: Diagnosis not present

## 2022-04-07 DIAGNOSIS — Z6841 Body Mass Index (BMI) 40.0 and over, adult: Secondary | ICD-10-CM | POA: Diagnosis not present

## 2022-04-07 DIAGNOSIS — Z79899 Other long term (current) drug therapy: Secondary | ICD-10-CM | POA: Diagnosis not present

## 2022-04-12 DIAGNOSIS — Z79899 Other long term (current) drug therapy: Secondary | ICD-10-CM | POA: Diagnosis not present

## 2022-04-21 DIAGNOSIS — G4733 Obstructive sleep apnea (adult) (pediatric): Secondary | ICD-10-CM | POA: Diagnosis not present

## 2022-05-08 DIAGNOSIS — Z79899 Other long term (current) drug therapy: Secondary | ICD-10-CM | POA: Diagnosis not present

## 2022-05-08 DIAGNOSIS — E8881 Metabolic syndrome: Secondary | ICD-10-CM | POA: Diagnosis not present

## 2022-05-08 DIAGNOSIS — E119 Type 2 diabetes mellitus without complications: Secondary | ICD-10-CM | POA: Diagnosis not present

## 2022-05-08 DIAGNOSIS — F17210 Nicotine dependence, cigarettes, uncomplicated: Secondary | ICD-10-CM | POA: Diagnosis not present

## 2022-05-08 DIAGNOSIS — E611 Iron deficiency: Secondary | ICD-10-CM | POA: Diagnosis not present

## 2022-05-08 DIAGNOSIS — I10 Essential (primary) hypertension: Secondary | ICD-10-CM | POA: Diagnosis not present

## 2022-05-08 DIAGNOSIS — R7989 Other specified abnormal findings of blood chemistry: Secondary | ICD-10-CM | POA: Diagnosis not present

## 2022-05-08 DIAGNOSIS — M545 Low back pain, unspecified: Secondary | ICD-10-CM | POA: Diagnosis not present

## 2022-05-08 DIAGNOSIS — E559 Vitamin D deficiency, unspecified: Secondary | ICD-10-CM | POA: Diagnosis not present

## 2022-05-10 DIAGNOSIS — Z79899 Other long term (current) drug therapy: Secondary | ICD-10-CM | POA: Diagnosis not present

## 2022-05-18 DIAGNOSIS — R635 Abnormal weight gain: Secondary | ICD-10-CM | POA: Diagnosis not present

## 2022-05-18 DIAGNOSIS — E7800 Pure hypercholesterolemia, unspecified: Secondary | ICD-10-CM | POA: Diagnosis not present

## 2022-05-18 DIAGNOSIS — R21 Rash and other nonspecific skin eruption: Secondary | ICD-10-CM | POA: Diagnosis not present

## 2022-05-18 DIAGNOSIS — E1165 Type 2 diabetes mellitus with hyperglycemia: Secondary | ICD-10-CM | POA: Diagnosis not present

## 2022-05-22 DIAGNOSIS — G4733 Obstructive sleep apnea (adult) (pediatric): Secondary | ICD-10-CM | POA: Diagnosis not present

## 2022-06-06 DIAGNOSIS — Z79899 Other long term (current) drug therapy: Secondary | ICD-10-CM | POA: Diagnosis not present

## 2022-06-06 DIAGNOSIS — I10 Essential (primary) hypertension: Secondary | ICD-10-CM | POA: Diagnosis not present

## 2022-06-06 DIAGNOSIS — E119 Type 2 diabetes mellitus without complications: Secondary | ICD-10-CM | POA: Diagnosis not present

## 2022-06-06 DIAGNOSIS — E559 Vitamin D deficiency, unspecified: Secondary | ICD-10-CM | POA: Diagnosis not present

## 2022-06-06 DIAGNOSIS — R7989 Other specified abnormal findings of blood chemistry: Secondary | ICD-10-CM | POA: Diagnosis not present

## 2022-06-06 DIAGNOSIS — M545 Low back pain, unspecified: Secondary | ICD-10-CM | POA: Diagnosis not present

## 2022-06-06 DIAGNOSIS — E611 Iron deficiency: Secondary | ICD-10-CM | POA: Diagnosis not present

## 2022-06-06 DIAGNOSIS — R635 Abnormal weight gain: Secondary | ICD-10-CM | POA: Diagnosis not present

## 2022-06-06 DIAGNOSIS — E8881 Metabolic syndrome: Secondary | ICD-10-CM | POA: Diagnosis not present

## 2022-06-06 DIAGNOSIS — F17210 Nicotine dependence, cigarettes, uncomplicated: Secondary | ICD-10-CM | POA: Diagnosis not present

## 2022-06-08 DIAGNOSIS — Z79899 Other long term (current) drug therapy: Secondary | ICD-10-CM | POA: Diagnosis not present

## 2022-06-22 DIAGNOSIS — G4733 Obstructive sleep apnea (adult) (pediatric): Secondary | ICD-10-CM | POA: Diagnosis not present

## 2022-07-05 DIAGNOSIS — I10 Essential (primary) hypertension: Secondary | ICD-10-CM | POA: Diagnosis not present

## 2022-07-05 DIAGNOSIS — Z5320 Procedure and treatment not carried out because of patient's decision for unspecified reasons: Secondary | ICD-10-CM | POA: Diagnosis not present

## 2022-07-05 DIAGNOSIS — R7989 Other specified abnormal findings of blood chemistry: Secondary | ICD-10-CM | POA: Diagnosis not present

## 2022-07-05 DIAGNOSIS — Z79899 Other long term (current) drug therapy: Secondary | ICD-10-CM | POA: Diagnosis not present

## 2022-07-05 DIAGNOSIS — E8881 Metabolic syndrome: Secondary | ICD-10-CM | POA: Diagnosis not present

## 2022-07-05 DIAGNOSIS — F17210 Nicotine dependence, cigarettes, uncomplicated: Secondary | ICD-10-CM | POA: Diagnosis not present

## 2022-07-05 DIAGNOSIS — E611 Iron deficiency: Secondary | ICD-10-CM | POA: Diagnosis not present

## 2022-07-05 DIAGNOSIS — M545 Low back pain, unspecified: Secondary | ICD-10-CM | POA: Diagnosis not present

## 2022-07-05 DIAGNOSIS — E119 Type 2 diabetes mellitus without complications: Secondary | ICD-10-CM | POA: Diagnosis not present

## 2022-07-05 DIAGNOSIS — E559 Vitamin D deficiency, unspecified: Secondary | ICD-10-CM | POA: Diagnosis not present

## 2022-07-07 DIAGNOSIS — Z79899 Other long term (current) drug therapy: Secondary | ICD-10-CM | POA: Diagnosis not present

## 2022-07-11 DIAGNOSIS — Z6841 Body Mass Index (BMI) 40.0 and over, adult: Secondary | ICD-10-CM | POA: Diagnosis not present

## 2022-07-11 DIAGNOSIS — Z5320 Procedure and treatment not carried out because of patient's decision for unspecified reasons: Secondary | ICD-10-CM | POA: Diagnosis not present

## 2022-07-11 DIAGNOSIS — I10 Essential (primary) hypertension: Secondary | ICD-10-CM | POA: Diagnosis not present

## 2022-07-11 DIAGNOSIS — E119 Type 2 diabetes mellitus without complications: Secondary | ICD-10-CM | POA: Diagnosis not present

## 2022-07-11 DIAGNOSIS — E7800 Pure hypercholesterolemia, unspecified: Secondary | ICD-10-CM | POA: Diagnosis not present

## 2022-07-22 DIAGNOSIS — G4733 Obstructive sleep apnea (adult) (pediatric): Secondary | ICD-10-CM | POA: Diagnosis not present

## 2022-08-02 DIAGNOSIS — E88819 Insulin resistance, unspecified: Secondary | ICD-10-CM | POA: Diagnosis not present

## 2022-08-02 DIAGNOSIS — Z79899 Other long term (current) drug therapy: Secondary | ICD-10-CM | POA: Diagnosis not present

## 2022-08-02 DIAGNOSIS — I10 Essential (primary) hypertension: Secondary | ICD-10-CM | POA: Diagnosis not present

## 2022-08-02 DIAGNOSIS — E611 Iron deficiency: Secondary | ICD-10-CM | POA: Diagnosis not present

## 2022-08-02 DIAGNOSIS — E559 Vitamin D deficiency, unspecified: Secondary | ICD-10-CM | POA: Diagnosis not present

## 2022-08-02 DIAGNOSIS — F17210 Nicotine dependence, cigarettes, uncomplicated: Secondary | ICD-10-CM | POA: Diagnosis not present

## 2022-08-02 DIAGNOSIS — R7989 Other specified abnormal findings of blood chemistry: Secondary | ICD-10-CM | POA: Diagnosis not present

## 2022-08-02 DIAGNOSIS — Z5320 Procedure and treatment not carried out because of patient's decision for unspecified reasons: Secondary | ICD-10-CM | POA: Diagnosis not present

## 2022-08-02 DIAGNOSIS — M545 Low back pain, unspecified: Secondary | ICD-10-CM | POA: Diagnosis not present

## 2022-08-02 DIAGNOSIS — E119 Type 2 diabetes mellitus without complications: Secondary | ICD-10-CM | POA: Diagnosis not present

## 2022-08-02 DIAGNOSIS — R635 Abnormal weight gain: Secondary | ICD-10-CM | POA: Diagnosis not present

## 2022-08-04 DIAGNOSIS — Z79899 Other long term (current) drug therapy: Secondary | ICD-10-CM | POA: Diagnosis not present

## 2022-08-08 DIAGNOSIS — R5383 Other fatigue: Secondary | ICD-10-CM | POA: Diagnosis not present

## 2022-08-08 DIAGNOSIS — I10 Essential (primary) hypertension: Secondary | ICD-10-CM | POA: Diagnosis not present

## 2022-08-08 DIAGNOSIS — G4733 Obstructive sleep apnea (adult) (pediatric): Secondary | ICD-10-CM | POA: Diagnosis not present

## 2022-08-08 DIAGNOSIS — E119 Type 2 diabetes mellitus without complications: Secondary | ICD-10-CM | POA: Diagnosis not present

## 2022-08-08 DIAGNOSIS — E559 Vitamin D deficiency, unspecified: Secondary | ICD-10-CM | POA: Diagnosis not present

## 2022-08-08 DIAGNOSIS — Z79899 Other long term (current) drug therapy: Secondary | ICD-10-CM | POA: Diagnosis not present

## 2022-08-08 DIAGNOSIS — E291 Testicular hypofunction: Secondary | ICD-10-CM | POA: Diagnosis not present

## 2022-08-08 DIAGNOSIS — E7800 Pure hypercholesterolemia, unspecified: Secondary | ICD-10-CM | POA: Diagnosis not present

## 2022-08-08 DIAGNOSIS — Z1159 Encounter for screening for other viral diseases: Secondary | ICD-10-CM | POA: Diagnosis not present

## 2022-08-22 DIAGNOSIS — G4733 Obstructive sleep apnea (adult) (pediatric): Secondary | ICD-10-CM | POA: Diagnosis not present

## 2022-08-28 ENCOUNTER — Other Ambulatory Visit: Payer: Self-pay

## 2022-08-28 ENCOUNTER — Emergency Department (HOSPITAL_COMMUNITY)
Admission: EM | Admit: 2022-08-28 | Discharge: 2022-08-29 | Disposition: A | Discharge status: Home / Self Care | Payer: Medicaid Other | Attending: Emergency Medicine | Admitting: Emergency Medicine

## 2022-08-28 ENCOUNTER — Encounter (HOSPITAL_COMMUNITY): Payer: Self-pay | Admitting: Emergency Medicine

## 2022-08-28 ENCOUNTER — Emergency Department (HOSPITAL_COMMUNITY): Payer: Medicaid Other

## 2022-08-28 DIAGNOSIS — M7989 Other specified soft tissue disorders: Secondary | ICD-10-CM | POA: Insufficient documentation

## 2022-08-28 DIAGNOSIS — E878 Other disorders of electrolyte and fluid balance, not elsewhere classified: Secondary | ICD-10-CM | POA: Insufficient documentation

## 2022-08-28 DIAGNOSIS — E871 Hypo-osmolality and hyponatremia: Secondary | ICD-10-CM | POA: Diagnosis not present

## 2022-08-28 DIAGNOSIS — F419 Anxiety disorder, unspecified: Secondary | ICD-10-CM | POA: Diagnosis not present

## 2022-08-28 DIAGNOSIS — R457 State of emotional shock and stress, unspecified: Secondary | ICD-10-CM | POA: Diagnosis not present

## 2022-08-28 DIAGNOSIS — R0602 Shortness of breath: Secondary | ICD-10-CM | POA: Insufficient documentation

## 2022-08-28 DIAGNOSIS — R0902 Hypoxemia: Secondary | ICD-10-CM | POA: Diagnosis not present

## 2022-08-28 LAB — CBC WITH DIFFERENTIAL/PLATELET
Abs Immature Granulocytes: 0.04 10*3/uL (ref 0.00–0.07)
Basophils Absolute: 0.1 10*3/uL (ref 0.0–0.1)
Basophils Relative: 1 %
Eosinophils Absolute: 0.1 10*3/uL (ref 0.0–0.5)
Eosinophils Relative: 2 %
HCT: 38.7 % — ABNORMAL LOW (ref 39.0–52.0)
Hemoglobin: 12.5 g/dL — ABNORMAL LOW (ref 13.0–17.0)
Immature Granulocytes: 1 %
Lymphocytes Relative: 29 %
Lymphs Abs: 2.4 10*3/uL (ref 0.7–4.0)
MCH: 25.3 pg — ABNORMAL LOW (ref 26.0–34.0)
MCHC: 32.3 g/dL (ref 30.0–36.0)
MCV: 78.2 fL — ABNORMAL LOW (ref 80.0–100.0)
Monocytes Absolute: 0.6 10*3/uL (ref 0.1–1.0)
Monocytes Relative: 7 %
Neutro Abs: 5.1 10*3/uL (ref 1.7–7.7)
Neutrophils Relative %: 60 %
Platelets: 302 10*3/uL (ref 150–400)
RBC: 4.95 MIL/uL (ref 4.22–5.81)
RDW: 19.2 % — ABNORMAL HIGH (ref 11.5–15.5)
WBC: 8.3 10*3/uL (ref 4.0–10.5)
nRBC: 0 % (ref 0.0–0.2)

## 2022-08-28 LAB — COMPREHENSIVE METABOLIC PANEL
ALT: 29 U/L (ref 0–44)
AST: 40 U/L (ref 15–41)
Albumin: 3.9 g/dL (ref 3.5–5.0)
Alkaline Phosphatase: 72 U/L (ref 38–126)
Anion gap: 18 — ABNORMAL HIGH (ref 5–15)
BUN: 17 mg/dL (ref 6–20)
CO2: 20 mmol/L — ABNORMAL LOW (ref 22–32)
Calcium: 9.1 mg/dL (ref 8.9–10.3)
Chloride: 94 mmol/L — ABNORMAL LOW (ref 98–111)
Creatinine, Ser: 1.09 mg/dL (ref 0.61–1.24)
GFR, Estimated: >60 mL/min (ref >60)
Glucose, Bld: 116 mg/dL — ABNORMAL HIGH (ref 70–99)
Potassium: 3.8 mmol/L (ref 3.5–5.1)
Sodium: 132 mmol/L — ABNORMAL LOW (ref 135–145)
Total Bilirubin: 0.3 mg/dL (ref 0.3–1.2)
Total Protein: 7.5 g/dL (ref 6.5–8.1)

## 2022-08-28 LAB — TROPONIN I (HIGH SENSITIVITY): Troponin I (High Sensitivity): 8 ng/L (ref <18)

## 2022-08-28 LAB — ETHANOL: Alcohol, Ethyl (B): 286 mg/dL — ABNORMAL HIGH (ref <10)

## 2022-08-28 LAB — BRAIN NATRIURETIC PEPTIDE: B Natriuretic Peptide: 122.6 pg/mL — ABNORMAL HIGH (ref 0.0–100.0)

## 2022-08-28 MED ORDER — ALBUTEROL SULFATE HFA 108 (90 BASE) MCG/ACT IN AERS
1.0000 | INHALATION_SPRAY | RESPIRATORY_TRACT | Status: DC | PRN
  Administered 2022-08-28: 2 via RESPIRATORY_TRACT
  Filled 2022-08-28: qty 6.7

## 2022-08-28 MED ORDER — LORAZEPAM 1 MG PO TABS
1.0000 mg | ORAL_TABLET | Freq: Once | ORAL | Status: AC
  Administered 2022-08-28: 1 mg via ORAL
  Filled 2022-08-28: qty 1
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
## 2022-08-28 NOTE — ED Triage Notes (Signed)
Pt brought to ED with c/o shortness of breath x1 week. Pt reports increased anxiety and depression. EMS Reports clear lung sounds in all fields with unremarkable ECG.   EMS Vitals BP 130/86 HR 71 RR 16 SPO2  98% RA
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
## 2022-08-29 LAB — TROPONIN I (HIGH SENSITIVITY): Troponin I (High Sensitivity): 6 ng/L (ref <18)

## 2022-08-29 MED ORDER — FUROSEMIDE 10 MG/ML IJ SOLN
40.0000 mg | INTRAMUSCULAR | Status: AC
  Administered 2022-08-29: 40 mg via INTRAVENOUS
  Filled 2022-08-29: qty 4

## 2022-08-29 MED ORDER — LORAZEPAM 1 MG PO TABS
1.0000 mg | ORAL_TABLET | Freq: Once | ORAL | Status: AC
  Administered 2022-08-29: 1 mg via ORAL
  Filled 2022-08-29: qty 1

## 2022-08-29 MED ORDER — QUETIAPINE FUMARATE 25 MG PO TABS: 25.0000 mg | ORAL_TABLET | Freq: Every day | ORAL | 0 refills | Status: DC

## 2022-08-29 MED ORDER — FUROSEMIDE 20 MG PO TABS: 20.0000 mg | ORAL_TABLET | Freq: Every day | ORAL | 0 refills | Status: DC

## 2022-08-29 MED ORDER — IPRATROPIUM-ALBUTEROL 0.5-2.5 (3) MG/3ML IN SOLN
3.0000 mL | Freq: Once | RESPIRATORY_TRACT | Status: AC
  Administered 2022-08-29: 3 mL via RESPIRATORY_TRACT
  Filled 2022-08-29: qty 3

## 2022-08-29 NOTE — ED Notes (Signed)
Spo2 remained 97% while ambulating in hall

## 2022-08-29 NOTE — ED Provider Notes (Signed)
[facility]PT [facility] Emergency Department Provider Note MRN:  [id number]  Arrival date & time: 08/29/22     Chief Complaint   Shortness of Breath   History of Present Illness   Bryan Huffman is a 51 y.o. year-old male presents to the ED with chief complaint of SOB.  He states that he has had gradually worsening shortness of breath for the past couple months.  He states that he is out of his fluticasone inhaler.  He states that he has also been out of his Seroquel, believes this is causing him to feel more anxious than normal.  He reports new bilateral lower extremity swelling.  Denies history of congestive heart failure.  Denies chest pain.  States that he had a panic attack earlier today because he has not been sleeping and this is what prompted him to come to the emergency department for evaluation of his chronic or subacute shortness of breath..  History provided by patient.   Review of Systems  Pertinent positive and negative review of systems noted in HPI.    Physical Exam   Vitals:   08/29/22 0235 08/29/22 0401  BP: 107/83 (!) 170/103  Pulse: 95 82  Resp: 16 (!) 26  Temp:  98.2 F (36.8 C)  SpO2: 98% 97%    CONSTITUTIONAL:  anxious-appearing, NAD NEURO:  Alert and oriented x 3, CN 3-12 grossly intact EYES:  eyes equal and reactive ENT/NECK:  Supple, no stridor  CARDIO:  normal rate, regular rhythm, appears well-perfused  PULM:  No respiratory distress, faint end expiratory wheezes GI/GU:  non-distended, high BMI MSK/SPINE:  No gross deformities, no edema, moves all extremities  SKIN:  no rash, atraumatic   *Additional and/or pertinent findings included in MDM below  Diagnostic and Interventional Summary    EKG Interpretation  Date/Time:    Ventricular Rate:    PR Interval:    QRS Duration:   QT Interval:    QTC Calculation:   R Axis:     Text Interpretation:         Labs Reviewed  COMPREHENSIVE METABOLIC PANEL - Abnormal; Notable  for the following components:      Result Value   Sodium 132 (*)    Chloride 94 (*)    CO2 20 (*)    Glucose, Bld 116 (*)    Anion gap 18 (*)    All other components within normal limits  CBC WITH DIFFERENTIAL/PLATELET - Abnormal; Notable for the following components:   Hemoglobin 12.5 (*)    HCT 38.7 (*)    MCV 78.2 (*)    MCH 25.3 (*)    RDW 19.2 (*)    All other components within normal limits  ETHANOL - Abnormal; Notable for the following components:   Alcohol, Ethyl (B) 286 (*)    All other components within normal limits  BRAIN NATRIURETIC PEPTIDE - Abnormal; Notable for the following components:   B Natriuretic Peptide 122.6 (*)    All other components within normal limits  TROPONIN I (HIGH SENSITIVITY)  TROPONIN I (HIGH SENSITIVITY)    DG Chest 2 View  Final Result      Medications  albuterol (VENTOLIN HFA) 108 (90 Base) MCG/ACT inhaler 1-2 puff (2 puffs Inhalation Given 08/28/22 2111)  LORazepam (ATIVAN) tablet 1 mg (1 mg Oral Given 08/28/22 2122)  furosemide (LASIX) injection 40 mg (40 mg Intravenous Given 08/29/22 0440)  ipratropium-albuterol (DUONEB) 0.5-2.5 (3) MG/3ML nebulizer solution 3 mL (3 mLs Nebulization Given 08/29/22 0439)  LORazepam (ATIVAN) tablet 1 mg (1 mg Oral Given 08/29/22 0438)     Procedures  /  Critical Care Procedures  ED Course and Medical Decision Making  I have reviewed the triage vital signs, the nursing notes, and pertinent available records from the EMR.  Social Determinants Affecting Complexity of Care: Patient is affected by alcoholism.   ED Course:    Medical Decision Making Patient here with shortness of breath.  Likely multifactorial.  He has been out of his inhaler.  Is also been drinking heavily.  He also states he feels anxious because he is out of his Seroquel.  He does have bilateral lower extremity swelling, which she says is new.  He denies history of congestive heart failure.  I do think that he would benefit from  evaluation by cardiology on an outpatient basis.  He has some minor wheezes, I will give him another breathing treatment here.  I think that he would probably benefit from a short course of Lasix.  Will also give him a dose of Ativan.  He does not appear to be in acute alcohol withdrawal.  I do not think that he requires admission in the hospital.  Patient ambulates maintaining normal O2 sat.  States that he feels a bit better.  Will discharge home with some lasix for a few days.  He states he is out of his seroquel which is preventing him from sleeping and likely worsening his anxiety.  Will give a short course of this and he is advised to follow-up with his doctor about getting more.  Amount and/or Complexity of Data Reviewed Labs: ordered.    Details: Labs ordered in triage notable for troponin of 8->6. BNP 122.6, increased from 39, no others to compare to. Ethanol 286. Mild hyponatremia and hypochloridemia. Mildly elevated anion gap 18. Radiology: ordered and independent interpretation performed.    Details: No effusion or opacity ECG/medicine tests: independent interpretation performed.    Details: Normal sinus rhythm, without ischemic changes  Risk Prescription drug management.     Consultants: No consultations were needed in caring for this patient.   Treatment and Plan: I considered admission due to patient's initial presentation, but after considering the examination and diagnostic results, patient will not require admission and can be discharged with outpatient follow-up.    Final Clinical Impressions(s) / ED Diagnoses     ICD-10-CM   1. Shortness of breath  R06.02 Ambulatory referral to Cardiology    2. Leg swelling  M79.89     3. Anxiety  F41.9       ED Discharge Orders          Ordered    QUEtiapine (SEROQUEL) 25 MG tablet  Daily at bedtime        08/29/22 0543    furosemide (LASIX) 20 MG tablet  Daily        08/29/22 0543    Ambulatory referral to  Cardiology       Comments: If you have not heard from the Cardiology office within the next 72 hours please call [phone].   08/29/22 0543              Discharge Instructions Discussed with and Provided to Patient:   Discharge Instructions   None      Roxy Horseman, PA-C 08/29/22 0545    Tilden Fossa, MD 08/29/22 [phone]

## 2022-09-04 DIAGNOSIS — R635 Abnormal weight gain: Secondary | ICD-10-CM | POA: Diagnosis not present

## 2022-09-04 DIAGNOSIS — Z6841 Body Mass Index (BMI) 40.0 and over, adult: Secondary | ICD-10-CM | POA: Diagnosis not present

## 2022-09-04 DIAGNOSIS — M545 Low back pain, unspecified: Secondary | ICD-10-CM | POA: Diagnosis not present

## 2022-09-04 DIAGNOSIS — Z79899 Other long term (current) drug therapy: Secondary | ICD-10-CM | POA: Diagnosis not present

## 2022-09-04 DIAGNOSIS — F17210 Nicotine dependence, cigarettes, uncomplicated: Secondary | ICD-10-CM | POA: Diagnosis not present

## 2022-09-15 DIAGNOSIS — Z0000 Encounter for general adult medical examination without abnormal findings: Secondary | ICD-10-CM | POA: Diagnosis not present

## 2022-09-15 DIAGNOSIS — R601 Generalized edema: Secondary | ICD-10-CM | POA: Diagnosis not present

## 2022-09-15 DIAGNOSIS — Z6841 Body Mass Index (BMI) 40.0 and over, adult: Secondary | ICD-10-CM | POA: Diagnosis not present

## 2022-09-15 DIAGNOSIS — Z09 Encounter for follow-up examination after completed treatment for conditions other than malignant neoplasm: Secondary | ICD-10-CM | POA: Diagnosis not present

## 2022-09-15 DIAGNOSIS — Z1331 Encounter for screening for depression: Secondary | ICD-10-CM | POA: Diagnosis not present

## 2022-09-15 DIAGNOSIS — R030 Elevated blood-pressure reading, without diagnosis of hypertension: Secondary | ICD-10-CM | POA: Diagnosis not present

## 2022-09-15 DIAGNOSIS — R0602 Shortness of breath: Secondary | ICD-10-CM | POA: Diagnosis not present

## 2022-09-21 DIAGNOSIS — G4733 Obstructive sleep apnea (adult) (pediatric): Secondary | ICD-10-CM | POA: Diagnosis not present

## 2022-09-27 ENCOUNTER — Encounter: Payer: Self-pay | Admitting: Cardiology

## 2022-09-27 ENCOUNTER — Ambulatory Visit: Payer: Medicaid Other | Attending: Cardiology | Admitting: Cardiology

## 2022-09-27 VITALS — BP 135/79 | HR 59 | Ht 66.0 in | Wt 264.6 lb

## 2022-09-27 DIAGNOSIS — R0602 Shortness of breath: Secondary | ICD-10-CM | POA: Insufficient documentation

## 2022-09-27 DIAGNOSIS — R0609 Other forms of dyspnea: Secondary | ICD-10-CM | POA: Diagnosis not present

## 2022-09-27 DIAGNOSIS — R0601 Orthopnea: Secondary | ICD-10-CM

## 2022-09-27 DIAGNOSIS — F17210 Nicotine dependence, cigarettes, uncomplicated: Secondary | ICD-10-CM

## 2022-09-27 DIAGNOSIS — I10 Essential (primary) hypertension: Secondary | ICD-10-CM

## 2022-09-27 DIAGNOSIS — R072 Precordial pain: Secondary | ICD-10-CM | POA: Diagnosis not present

## 2022-09-27 MED ORDER — METOPROLOL TARTRATE 25 MG PO TABS: 25.0000 mg | ORAL_TABLET | Freq: Once | ORAL | 0 refills | Status: DC

## 2022-09-27 MED ORDER — METOPROLOL TARTRATE 50 MG PO TABS: 50.0000 mg | ORAL_TABLET | Freq: Once | ORAL | 0 refills | Status: DC

## 2022-09-27 NOTE — Patient Instructions (Addendum)
Medication Instructions:  See below instructions   *If you need a refill on your cardiac medications before your next appointment, please call your pharmacy*   Lab Work: Cmp Lipid Lpa CBC See below instruction  If you have labs (blood work) drawn today and your tests are completely normal, you will receive your results only by: MyChart Message (if you have MyChart) OR A paper copy in the mail If you have any lab test that is abnormal or we need to change your treatment, we will call you to review the results.   Testing/Procedures: WIll be schedule in [facility] Your physician has requested that you have an echocardiogram. Echocardiography is a painless test that uses sound waves to create images of your heart. It provides your doctor with information about the size and shape of your heart and how well your heart's chambers and valves are working. This procedure takes approximately one hour. There are no restrictions for this procedure. Please do NOT wear cologne, perfume, aftershave, or lotions (deodorant is allowed). Please arrive 15 minutes prior to your appointment time. And   Will be schedule at [facility] [address]  Your physician has requested that you have coronary  CTA. Coronary computed tomography (CT)angiogram  is a special type of CT scan that uses a computer to produce multi-dimensional views of major blood vessels throughout the heart.  CT angiography, a contrast material is injected through an IV to help visualize the blood vessels  a painless test that uses an x-ray machine to take clear, detailed pictures of your heart arteries .  Please follow instruction sheet as given.  Follow-Up: At [facility], you and your health needs are our priority.  As part of our continuing mission to provide you with exceptional heart care, we have created designated Provider Care Teams.  These Care Teams include your primary Cardiologist (physician) and Advanced  Practice Providers (APPs -  Physician Assistants and Nurse Practitioners) who all work together to provide you with the care you need, when you need it.     Your next appointment:   3 to 4  month(s)  The format for your next appointment:   In Person  Provider:   Bryan Lemma, MD    Other Instructions    Your cardiac CT will be scheduled at one below location:   [facility] [address] [place], [place] [address] [phone]    Please arrive at the [facility] and [facility] Entrance (Entrance C2) of [facility] 30 minutes prior to test start time. You can use the FREE valet parking offered at entrance C (encouraged to control the heart rate for the test)  Proceed to the [facility] Radiology Department (first floor) to check-in and test prep.  All radiology patients and guests should use entrance C2 at [facility], accessed from [facility], even though the hospital's physical address listed is [address].       Please follow these instructions carefully (unless otherwise directed):  Hold all erectile dysfunction medications at least 3 days (72 hrs) prior to test. (Ie viagra, cialis, sildenafil, tadalafil, etc) We will administer nitroglycerin during this exam.    Please do lab- CMP at least one week prior to test    On the Night Before the Test: Be sure to Drink plenty of water. Do not consume any caffeinated/decaffeinated beverages or chocolate 12 hours prior to your test. Do not take any antihistamines 12 hours prior to  your test.  On the Day of the Test: Drink plenty of water until 1 hour prior to the test. Do not eat any food 1 hour prior to test. You may take your regular medications prior to the test.  Take metoprolol (Lopressor)  25 mg two hours prior to test       After the Test: Drink plenty of water. After receiving IV contrast, you may experience a mild flushed feeling. This is normal. On  occasion, you may experience a mild rash up to 24 hours after the test. This is not dangerous. If this occurs, you can take Benadryl 25 mg and increase your fluid intake. If you experience trouble breathing, this can be serious. If it is severe call 911 IMMEDIATELY. If it is mild, please call our office. .  We will call to schedule your test 2-4 weeks out understanding that some insurance companies will need an authorization prior to the service being performed.   For non-scheduling related questions, please contact the cardiac imaging nurse navigator should you have any questions/concerns: Rockwell [place], Cardiac Imaging Nurse Navigator Larey Brick, Cardiac Imaging Nurse Navigator [facility] Direct Office Dial: [phone]   For scheduling needs, including cancellations and rescheduling, please call [place], [phone].

## 2022-09-27 NOTE — Progress Notes (Signed)
Primary Care Provider: Elpidio [facility] Cardiologist: Bryan Lemma, MD Electrophysiologist: None  Clinic Note: No chief complaint on file.  ===================================  ASSESSMENT/PLAN   Problem List Items Addressed This Visit       Cardiology Problems   Essential hypertension (Chronic)    Borderline pressures today.  He is currently on amlodipine 5 mg, lisinopril  20 mg and high-dose Toprol 100 mg along with spironolactone 50 mg.  Blood pressures are actually still high and normal.  He has room to increase amlodipine for additional benefit.  This may also be helpful for antianginal benefit.      Relevant Medications   spironolactone (ALDACTONE) 50 MG tablet   amLODipine (NORVASC) 5 MG tablet   Other Relevant Orders   Comprehensive metabolic panel   CBC   Lipoprotein A (LPA)   Lipid panel     Other   Precordial pain    He has some rare intermittent chest discomfort episodes but they are not associate with him to get activity, however he does notice that if he keeps pushing beyond the level of exertional dyspnea, he will have a high heaviness and tightness in the center of his chest that goes away as soon as he stops.  For baseline evaluation recommend Coronary CTA based on his risk factors and family history.  Plan: Coronary CTA Labs ordered.      Relevant Orders   Basic metabolic panel   Comprehensive metabolic panel   CBC   Lipoprotein A (LPA)   Lipid panel   ECHOCARDIOGRAM COMPLETE   CT CORONARY MORPH W/CTA COR W/SCORE W/CA W/CM &/OR WO/CM   Orthopnea    He does have some orthopnea which may or may not be simply related to his obesity and OSA.  However would like to reassess EF and wall motion/diastolic function.      Relevant Orders   Basic metabolic panel   Comprehensive metabolic panel   CBC   Lipoprotein A (LPA)   Lipid panel   DOE (dyspnea on exertion) - Primary    I am sure this is multifactorial.  He had an  echocardiogram done a few years ago that was relatively normal but there was not evidence of aortic sclerosis.  He still has a murmur.  Could be stenosis.  He is having some chest tightness which is also concerning.  Plan: Evaluate cardiac risk factors with CMP lipids and A1c Check 2D echocardiogram and Coronary CTA      Relevant Orders   Basic metabolic panel   Comprehensive metabolic panel   CBC   Lipoprotein A (LPA)   Lipid panel   ECHOCARDIOGRAM COMPLETE   Cigarette smoker (Chronic)    Working on smoking cessation, not probably ready yet.  He says he will few days.      Relevant Orders   Comprehensive metabolic panel   CBC   Lipoprotein A (LPA)   Lipid panel   ECHOCARDIOGRAM COMPLETE    ===================================  HPI:    Bevan A Bromell is a morbidly obese 51 y.o. male with history of HTN, GERD, chronic back pain with limited mobility, and family history of CAD (father Derry Lory is a patient of mine with known CAD) who is being seen today for ER Follow-Up Evaluation of  Shortness of Breath/Leg Swelling And Rare Chest Tightness (that he felt may have been an anxiety) at the request of Roxy Horseman, PA-C.  Ivor A Mangas was seen on January 23, 2019 by Dr.  Kardie Tobb in [place] preop evaluation pending C5-C6 spinal fusion surgery.  There is concern for intermittent episodes of chest pressure as if someone was sitting on his chest.  Noted similar symptoms back in 2008 when his grandmother passed.=> Was evaluated with echo and Myoview we reviewed below.  Both normal. => He did not follow-up after the results were read and clearance provided..  Recent Hospitalizations:  [facility] 11/[facility]-21/20[facility]: Presented with chest pain and dyspnea that was gradually worsening over the past several months.  He was out of his fluticasone inhaler as well as some Seroquel.  More anxious than normal.  Noted some mild bilateral lower extremity edema.  Also noted a possible  panic attack with chest tightness.  Poor sleep. => Was hypertensive with a blood pressure 170/103 on initial evaluation and then on follow-up was down to 107/83.Marland Kitchen  He was given IV Lasix, nebulizer, Ativan 1.  X 2.  Breathing improved.  Sats were normal with ambulating.  Troponin is negative. Likely multifactorial. Due to edema and family history was referred for cardiology evaluation.  (He asked to be scheduled to see me since I am his father's cardiologist) Given prescription for Lasix 20 mg.  Reviewed  CV studies:    The following studies were reviewed today: (if available, images/films reviewed: From Epic Chart or Care Everywhere)  Echo Oct 2020: EF 55 to 60%.  Normal LV size and function.  No LVH.  GR 1 DD but normal LA size.  Normal RV size and function.  Trivial pericardial effusion.  Mild to moderate AOV calcification/porosis but no stenosis.  Normal mitral and tricuspid valves.  Normal right ventricular pressures and right atrial pressure.  Myoview October 2020: NORMAL study. EF > 65% (Hyperdynamic). NO ST segment changes with exercise (Neg EKG ST) with NO ISCHEMIA OR INFARCTION.   Interval History:   Johnothan A Zaragoza presents here today for evaluation of several months of exertional dyspnea.  After the ER visit he called his PCP who referred him here.  He notes that he been having progressively worsening exertional dyspnea.  He has some resting dyspnea but worse with minimal exertion and walking.  He does not describe chest pain symptoms but he does have an aching tightness if he keeps going on the shortness of breath kicks in, but if he stops before that point, he does not have the tightness.  He has some mild edema but not significant.  No real PND orthopnea-somewhat difficult to assess due to his body habitus and being on CPAP at night.  He has a hard time doing much walking because of right-sided leg and back pain with radiculopathy.  He sudden onset neurologic changes, TIA or  amaurosis fugax symptoms.  No claudication.  He is not sure if is because of his CPAP or what but he does not sleep through the night now and is not really feeling rested.  It is better if it is in the back, Seroquel.  Possible better presents being on Lasix.   REVIEWED OF SYSTEMS   Review of Systems  Constitutional:  Positive for malaise/fatigue (.  Deconditioned.). Negative for weight loss.  HENT:  Positive for congestion. Negative for sinus pain.   Respiratory:  Positive for shortness of breath (Mostly exertional). Negative for cough.   Cardiovascular:  Negative for leg swelling.  Gastrointestinal:  Positive for constipation. Negative for blood in stool and melena.  Genitourinary:  Negative for dysuria, frequency and hematuria.  Musculoskeletal:  Positive for back pain  and joint pain. Negative for falls and myalgias.  Neurological:  Positive for headaches. Negative for dizziness.  Psychiatric/Behavioral:  Positive for memory loss. Negative for depression. The patient is nervous/anxious.     I have reviewed and (if needed) personally updated the patient's problem list, medications, allergies, past medical and surgical history, social and family history.   PAST MEDICAL HISTORY   Past Medical History:  Diagnosis Date   Anxiety    Arthritis    Asthma    Back pain    Depression    GERD (gastroesophageal reflux disease)    Heart murmur    aortic sclerosis on Echo   Hemorrhoid    Hypertension    Insomnia    OSA (obstructive sleep apnea)    wears cpap    PAST SURGICAL HISTORY   Past Surgical History:  Procedure Laterality Date   ANTERIOR CERVICAL DECOMP/DISCECTOMY FUSION N/A 09/24/2019   Procedure: ANTERIOR CERVICAL DECOMPRESSION/DISCECTOMY FUSION C5-6;  Surgeon: Melina Schools, MD;  Location: [facility];  Service: Orthopedics;  Laterality: N/A;  3.5 hrs   ANTERIOR LAT LUMBAR FUSION N/A 01/29/2014   Procedure: X LIF L2-L3 WITH LATERAL PLATES -ANTERIOR LATERAL LUMBAR FUSION 1  LEVEL;  Surgeon: Melina Schools, MD;  Location[facility];  Service: Orthopedics;  Laterality: N/A;   BACK SURGERY  1998,2008   L3 L4 L5 fusion   5 total on spine   HEMORRHOID SURGERY     I & D EXTREMITY Right 09/07/2014   Procedure: DEBRIDEMENT RIGHT HAND EXTENSOR TENOSYNOVECTOMY;  Surgeon: Jolyn Nap, MD;  Location: [facility];  Service: Orthopedics;  Laterality: Right;   I & D EXTREMITY Right 12/14/2014   Procedure: IRRIGATION AND DEBRIDEMENT EXTREMITY RIGHT HAND;  Surgeon: Milly Jakob, MD;  Location: [facility];  Service: Orthopedics;  Laterality: Right;  RRIGATION AND DEBRIDEMENT EXTREMITY RIGHT HAND   LUMBAR LAMINECTOMY/DECOMPRESSION MICRODISCECTOMY Left 10/15/2013   Procedure: LUMBAR 2-3 LEFT DISCECTOMY;  Surgeon: Melina Schools, MD;  Location: [facility];  Service: Orthopedics;  Laterality: Left;   LUMBAR SPINE SURGERY  10/15/2013   L 2 L3  DISECTOMY   NM MYOVIEW LTD  10/2018   NORMAL study. EF > 65% (Hyperdynamic). NO ST segment changes with exercise (Neg EKG ST) with NO ISCHEMIA OR INFARCTION.   SPINE SURGERY     TONSILLECTOMY     TOTAL HIP ARTHROPLASTY Left 10/14/2020   Procedure: TOTAL HIP ARTHROPLASTY ANTERIOR APPROACH;  Surgeon: Rod Can, MD;  Location: [facility];  Service: Orthopedics;  Laterality: Left;   TRANSTHORACIC ECHOCARDIOGRAM  07/2019   EF 55 to 60%.  Normal LV size and function.  No LVH.  GR 1 DD but normal LA size.  Normal RV size and function.  Trivial pericardial effusion.  Mild to moderate AOV calcification/porosis but no stenosis.  Normal mitral and tricuspid valves.  Normal right ventricular pressures and right atrial pressure.    There is no immunization history for the selected administration types on file for this patient.  MEDICATIONS/ALLERGIES   Current Meds  Medication Sig   albuterol (VENTOLIN HFA) 108 (90 Base) MCG/ACT inhaler Inhale 2 puffs into the lungs every 6 (six) hours as needed for wheezing or shortness of  breath.   amLODipine (NORVASC) 5 MG tablet Take 1 tablet by mouth daily.   dexlansoprazole (DEXILANT) 60 MG capsule Take 60 mg by mouth daily.    ergocalciferol (VITAMIN D2) 1.25 MG (50000 UT) capsule Take 50,000 Units by mouth every Monday.   fluticasone furoate-vilanterol (  BREO ELLIPTA) 100-25 MCG/ACT AEPB Inhale 1 puff into the lungs daily.   ibuprofen (ADVIL) 200 MG tablet Take 800 mg by mouth 3 (three) times daily as needed for moderate pain.   Icosapent Ethyl (VASCEPA) 0.5 g CAPS Take 1 g by mouth 2 (two) times daily.   lisinopril (ZESTRIL) 10 MG tablet Take 2 tablets (20 mg total) by mouth daily. (Patient taking differently: Take 10 mg by mouth daily.)   metoprolol succinate (TOPROL-XL) 100 MG 24 hr tablet Take 1 tablet (100 mg total) by mouth daily. Take with or immediately following a meal.   metoprolol tartrate (LOPRESSOR) 25 MG tablet Take 1 tablet (25 mg total) by mouth once for 1 dose. TAKE TWO HOURS PRIOR TO  SCHEDULE CARDIAC TEST   oxyCODONE-acetaminophen (PERCOCET) 10-325 MG tablet Take 1 tablet by mouth 4 (four) times daily.   oxymetazoline (AFRIN) 0.05 % nasal spray Place 3 sprays into both nostrils at bedtime as needed for congestion.   pregabalin (LYRICA) 150 MG capsule Take 150 mg by mouth 2 (two) times daily.   QUEtiapine (SEROQUEL) 25 MG tablet Take 1 tablet (25 mg total) by mouth at bedtime.   spironolactone (ALDACTONE) 50 MG tablet Take 50 mg by mouth 2 (two) times daily.   testosterone cypionate (DEPOTESTOSTERONE CYPIONATE) 200 MG/ML injection Inject 100 mg into the muscle every 14 (fourteen) days.   [DISCONTINUED] metoprolol tartrate (LOPRESSOR) 50 MG tablet Take 1 tablet (50 mg total) by mouth once for 1 dose. TAKE TWO HOURS PRIOR TO  SCHEDULE CARDIAC TEST    Allergies  Allergen Reactions   Bee Venom Anaphylaxis and Swelling   Chlorthalidone     Dehydrated/sweating/ passed out   Statins     Muscle pain    SOCIAL HISTORY/FAMILY HISTORY   Reviewed in Epic:    Social History   Tobacco Use   Smoking status: Some Days    Packs/day: 0.50    Years: 18.00    Total pack years: 9.00    Types: Cigarettes   Smokeless tobacco: Never   Tobacco comments:    cutting back  Vaping Use   Vaping Use: Never used  Substance Use Topics   Alcohol use: Yes    Comment: 2-3 beers a week   Drug use: Yes    Types: Marijuana   Social History   Social History Narrative   He is now on disability because of chronic back pain.  His son does not really come around very much and he is divorced so he does not want.   Not very active.  Trying to quit smoking.         Apparently he was turned down for recurrent disability a is trying to get additional coverage for medication coverage.  (Social Security insurance? coverage)   Family History  Problem Relation Age of Onset   Hyperlipidemia Father    CAD Father 74       Had stents placed for angina.   Diabetes Father    Other Brother        Suspected suicide    OBJCTIVE -PE, EKG, labs   Wt Readings from Last 3 Encounters:  09/27/22 264 lb 9.6 oz (120 kg)  10/14/20 243 lb (110.2 kg)  10/05/20 243 lb (110.2 kg)    Physical Exam: BP 135/79 (BP Location: Left Arm, Patient Position: Sitting, Cuff Size: Large)   Pulse (!) 59   Ht 5\' 6"  (1.676 m)   Wt 264 lb 9.6 oz (120 kg)  SpO2 96%   BMI 42.71 kg/m  Physical Exam Vitals reviewed.  Constitutional:      General: He is not in acute distress.    Appearance: He is obese. He is not ill-appearing (Morbidly obese but otherwise well-groomed and well-nourished.) or toxic-appearing.  HENT:     Head: Normocephalic and atraumatic.  Neck:     Vascular: No carotid bruit.  Cardiovascular:     Rate and Rhythm: Regular rhythm. Bradycardia present. Occasional Extrasystoles are present.    Chest Wall: PMI is not displaced (Difficult to assess).     Pulses: Decreased pulses (Formal exam not done, but pedal pulses are diminished.).     Heart sounds: S1 normal and S2  normal. Heart sounds are distant. Murmur heard.     Harsh crescendo-decrescendo early systolic murmur is present with a grade of 1/6 at the upper right sternal border radiating to the neck.     No friction rub. No gallop.  Pulmonary:     Effort: Pulmonary effort is normal. No respiratory distress.     Breath sounds: No wheezing, rhonchi or rales.  Abdominal:     General: Abdomen is flat. There is no distension.     Palpations: Abdomen is soft. There is no mass.     Tenderness: There is no abdominal tenderness. There is no guarding.     Hernia: No hernia is present.     Comments: Significantly obese.  Difficult to assess HSM or bruit.  Musculoskeletal:        General: Swelling (R>L) present.     Cervical back: Normal range of motion and neck supple.  Skin:    General: Skin is warm and dry.  Neurological:     General: No focal deficit present.     Mental Status: He is alert and oriented to person, place, and time.     Motor: No weakness.     Gait: Gait normal.  Psychiatric:        Mood and Affect: Mood normal.        Behavior: Behavior normal.        Thought Content: Thought content normal.        Judgment: Judgment normal.     Adult ECG Report - > not checked today   08/29/2022 Rate: 70;  Rhythm: normal sinus rhythm and normal axis, intervals and durations.  NORMAL ;   Narrative Interpretation: Reviewed  Recent Labs: Reviewed Lab Results  Component Value Date   CHOL 172 07/25/2019   HDL 42 07/25/2019   LDLCALC 76 07/25/2019   TRIG 335 (H) 07/25/2019   CHOLHDL 4.1 07/25/2019   Lab Results  Component Value Date   CREATININE 1.09 08/28/2022   BUN 17 08/28/2022   NA 132 (L) 08/28/2022   K 3.8 08/28/2022   CL 94 (L) 08/28/2022   CO2 20 (L) 08/28/2022      Latest Ref Rng & Units 08/28/2022    7:59 PM 12/26/2021   10:32 PM 10/15/2020    3:47 AM  CBC  WBC 4.0 - 10.5 K/uL 8.3  9.2  11.9   Hemoglobin 13.0 - 17.0 g/dL 12.5  12.5  10.6   Hematocrit 39.0 - 52.0 % 38.7   39.3  32.5   Platelets 150 - 400 K/uL 302  337  202     Lab Results  Component Value Date   HGBA1C 7.0 (H) 09/25/2019   No results found for: "TSH"  ================================================== I spent a total of 43 minutes with the  patient spent in direct patient consultation.  Additional time spent with chart review  / charting (studies, outside notes, etc): 18 min Total Time: 61 min  Current medicines are reviewed at length with the patient today.  (+/- concerns) none  Notice: This dictation was prepared with Dragon dictation along with smart phrase technology. Any transcriptional errors that result from this process are unintentional and may not be corrected upon review.   Studies Ordered:  Orders Placed This Encounter  Procedures   CT CORONARY MORPH W/CTA COR W/SCORE W/CA W/CM &/OR WO/CM   Basic metabolic panel   Comprehensive metabolic panel   CBC   Lipoprotein A (LPA)   Lipid panel   ECHOCARDIOGRAM COMPLETE   Meds ordered this encounter  Medications   DISCONTD: metoprolol tartrate (LOPRESSOR) 50 MG tablet    Sig: Take 1 tablet (50 mg total) by mouth once for 1 dose. TAKE TWO HOURS PRIOR TO  SCHEDULE CARDIAC TEST    Dispense:  1 tablet    Refill:  0   metoprolol tartrate (LOPRESSOR) 25 MG tablet    Sig: Take 1 tablet (25 mg total) by mouth once for 1 dose. TAKE TWO HOURS PRIOR TO  SCHEDULE CARDIAC TEST    Dispense:  1 tablet    Refill:  0    Patient Instructions / Medication Changes & Studies & Tests Ordered   Patient Instructions  Medication Instructions:  See below instructions   *If you need a refill on your cardiac medications before your next appointment, please call your pharmacy*   Lab Work: Cmp Lipid Lpa CBC See below instruction  If you have labs (blood work) drawn today and your tests are completely normal, you will receive your results only by: MyChart Message (if you have MyChart) OR A paper copy in the mail If you have any lab  test that is abnormal or we need to change your treatment, we will call you to review the results.   Testing/Procedures: WIll be schedule in [facility] Your physician has requested that you have an echocardiogram. Echocardiography is a painless test that uses sound waves to create images of your heart. It provides your doctor with information about the size and shape of your heart and how well your heart's chambers and valves are working. This procedure takes approximately one hour. There are no restrictions for this procedure. Please do NOT wear cologne, perfume, aftershave, or lotions (deodorant is allowed). Please arrive 15 minutes prior to your appointment time. And   Will be schedule at [facility] [address]  Your physician has requested that you have coronary  CTA. Coronary computed tomography (CT)angiogram  is a special type of CT scan that uses a computer to produce multi-dimensional views of major blood vessels throughout the heart.  CT angiography, a contrast material is injected through an IV to help visualize the blood vessels  a painless test that uses an x-ray machine to take clear, detailed pictures of your heart arteries .  Please follow instruction sheet as given.  Follow-Up: At [facility], you and your health needs are our priority.  As part of our continuing mission to provide you with exceptional heart care, we have created designated Provider Care Teams.  These Care Teams include your primary Cardiologist (physician) and Advanced Practice Providers (APPs -  Physician Assistants and Nurse Practitioners) who all work together to provide you with the care you need, when you need it.     Your next appointment:  3 to 4  month(s)  The format for your next appointment:   In Person  Provider:   Glenetta Hew, MD    Other Instructions    Your cardiac CT will be scheduled at one below location:   [facility] [address] [place], [place] [address] [phone]    Please arrive at the [facility] Entrance (Entrance C2) of [facility] 30 minutes prior to test start time. You can use the FREE valet parking offered at entrance C (encouraged to control the heart rate for the test)  Proceed to the [facility] Radiology Department (first floor) to check-in and test prep.  All radiology patients and guests should use entrance C2 at [facility], accessed from [facility], even though the hospital's physical address listed is [address].       Please follow these instructions carefully (unless otherwise directed):  Hold all erectile dysfunction medications at least 3 days (72 hrs) prior to test. (Ie viagra, cialis, sildenafil, tadalafil, etc) We will administer nitroglycerin during this exam.    Please do lab- CMP at least one week prior to test    On the Night Before the Test: Be sure to Drink plenty of water. Do not consume any caffeinated/decaffeinated beverages or chocolate 12 hours prior to your test. Do not take any antihistamines 12 hours prior to your test.  On the Day of the Test: Drink plenty of water until 1 hour prior to the test. Do not eat any food 1 hour prior to test. You may take your regular medications prior to the test.  Take metoprolol (Lopressor)  25 mg two hours prior to test       After the Test: Drink plenty of water. After receiving IV contrast, you may experience a mild flushed feeling. This is normal. On occasion, you may experience a mild rash up to 24 hours after the test. This is not dangerous. If this occurs, you can take Benadryl 25 mg and increase your fluid intake. If you experience trouble breathing, this can be serious. If it is severe call 911 IMMEDIATELY. If it is mild, please call our office. .  We will call to schedule your test 2-4 weeks out understanding that some insurance companies will need an  authorization prior to the service being performed.   For non-scheduling related questions, please contact the cardiac imaging nurse navigator should you have any questions/concerns: Marchia Bond, Cardiac Imaging Nurse Navigator Gordy Clement, Cardiac Imaging Nurse Navigator [facility] Direct Office Dial: [phone]   For scheduling needs, including cancellations and rescheduling, please call [place], [phone].     Leonie Man, MD, MS Glenetta Hew, M.D., M.S. Interventional Cardiologist  [place]  Pager # [phone] Phone # [phone] [address] [place], [place] [address]   Thank you for choosing [facility]!!

## 2022-10-01 ENCOUNTER — Encounter: Payer: Self-pay | Admitting: Cardiology

## 2022-10-01 NOTE — Assessment & Plan Note (Signed)
He does have some orthopnea which may or may not be simply related to his obesity and OSA.  However would like to reassess EF and wall motion/diastolic function.

## 2022-10-01 NOTE — Assessment & Plan Note (Signed)
Borderline pressures today.  He is currently on amlodipine 5 mg, lisinopril  20 mg and high-dose Toprol 100 mg along with spironolactone 50 mg.  Blood pressures are actually still high and normal.  He has room to increase amlodipine for additional benefit.  This may also be helpful for antianginal benefit.

## 2022-10-01 NOTE — Assessment & Plan Note (Signed)
Working on smoking cessation, not probably ready yet.  He says he will few days.

## 2022-10-01 NOTE — Assessment & Plan Note (Signed)
I am sure this is multifactorial.  He had an echocardiogram done a few years ago that was relatively normal but there was not evidence of aortic sclerosis.  He still has a murmur.  Could be stenosis.  He is having some chest tightness which is also concerning.  Plan: Evaluate cardiac risk factors with CMP lipids and A1c Check 2D echocardiogram and Coronary CTA

## 2022-10-01 NOTE — Assessment & Plan Note (Signed)
He has some rare intermittent chest discomfort episodes but they are not associate with him to get activity, however he does notice that if he keeps pushing beyond the level of exertional dyspnea, he will have a high heaviness and tightness in the center of his chest that goes away as soon as he stops.  For baseline evaluation recommend Coronary CTA based on his risk factors and family history.  Plan: Coronary CTA Labs ordered.

## 2022-10-06 ENCOUNTER — Telehealth (HOSPITAL_COMMUNITY): Payer: Self-pay | Admitting: *Deleted

## 2022-10-06 NOTE — Telephone Encounter (Signed)
Attempted to call patient regarding upcoming cardiac CT appointment. Unable to leave a message.  Pace Lamadrid RN Navigator Cardiac Imaging [facility] [phone] Office [phone] Cell  

## 2022-10-10 ENCOUNTER — Ambulatory Visit (HOSPITAL_COMMUNITY): Admission: RE | Admit: 2022-10-10 | Payer: Medicaid Other | Source: Ambulatory Visit

## 2022-10-16 ENCOUNTER — Telehealth (HOSPITAL_COMMUNITY): Payer: Self-pay | Admitting: Emergency Medicine

## 2022-10-16 ENCOUNTER — Telehealth (HOSPITAL_COMMUNITY): Payer: Self-pay | Admitting: *Deleted

## 2022-10-16 DIAGNOSIS — Z79899 Other long term (current) drug therapy: Secondary | ICD-10-CM | POA: Diagnosis not present

## 2022-10-16 DIAGNOSIS — M545 Low back pain, unspecified: Secondary | ICD-10-CM | POA: Diagnosis not present

## 2022-10-16 DIAGNOSIS — R7989 Other specified abnormal findings of blood chemistry: Secondary | ICD-10-CM | POA: Diagnosis not present

## 2022-10-16 DIAGNOSIS — E559 Vitamin D deficiency, unspecified: Secondary | ICD-10-CM | POA: Diagnosis not present

## 2022-10-16 DIAGNOSIS — R079 Chest pain, unspecified: Secondary | ICD-10-CM

## 2022-10-16 DIAGNOSIS — R635 Abnormal weight gain: Secondary | ICD-10-CM | POA: Diagnosis not present

## 2022-10-16 DIAGNOSIS — E119 Type 2 diabetes mellitus without complications: Secondary | ICD-10-CM | POA: Diagnosis not present

## 2022-10-16 DIAGNOSIS — I10 Essential (primary) hypertension: Secondary | ICD-10-CM | POA: Diagnosis not present

## 2022-10-16 DIAGNOSIS — F17210 Nicotine dependence, cigarettes, uncomplicated: Secondary | ICD-10-CM | POA: Diagnosis not present

## 2022-10-16 MED ORDER — METOPROLOL TARTRATE 25 MG PO TABS: 25.0000 mg | ORAL_TABLET | Freq: Once | ORAL | 0 refills | Status: DC

## 2022-10-16 NOTE — Telephone Encounter (Signed)
Reaching out to patient to offer assistance regarding upcoming cardiac imaging study; pt verbalizes understanding of appt date/time, parking situation and where to check in, pre-test NPO status and medications ordered, and verified current allergies; name and call back number provided for further questions should they arise Marchia Bond RN Navigator Cardiac Imaging [facility] [phone] office [phone] cell  Arrival 200 [address] entrance  25mg  metoprolol tart 2 hPTA Difficult IV. Aware contrast/nitro

## 2022-10-16 NOTE — Telephone Encounter (Signed)
Attempted to call patient regarding upcoming cardiac CT appointment. Voicemail not set up on his cell phone.  I called his home phone number and spoke with his mother.  Left name and phone number with her for patient to call back.  Gordy Clement RN Navigator Cardiac Imaging [facility] [phone] Office [phone] Cell

## 2022-10-17 ENCOUNTER — Ambulatory Visit (HOSPITAL_COMMUNITY)
Admission: RE | Admit: 2022-10-17 | Discharge: 2022-10-17 | Disposition: A | Discharge status: Home / Self Care | Payer: Medicaid Other | Source: Ambulatory Visit | Attending: Cardiology | Admitting: Cardiology

## 2022-10-17 ENCOUNTER — Encounter (HOSPITAL_COMMUNITY): Payer: Self-pay

## 2022-10-17 ENCOUNTER — Other Ambulatory Visit: Payer: Self-pay | Admitting: Cardiology

## 2022-10-17 DIAGNOSIS — R072 Precordial pain: Secondary | ICD-10-CM | POA: Insufficient documentation

## 2022-10-17 DIAGNOSIS — R931 Abnormal findings on diagnostic imaging of heart and coronary circulation: Secondary | ICD-10-CM

## 2022-10-17 MED ORDER — METOPROLOL TARTRATE 5 MG/5ML IV SOLN
INTRAVENOUS | Status: AC
  Filled 2022-10-17: qty 10

## 2022-10-17 MED ORDER — DILTIAZEM HCL 25 MG/5ML IV SOLN: 10.0000 mg | Freq: Once | INTRAVENOUS | Status: AC

## 2022-10-17 MED ORDER — NITROGLYCERIN 0.4 MG SL SUBL
SUBLINGUAL_TABLET | SUBLINGUAL | Status: AC
  Filled 2022-10-17: qty 2

## 2022-10-17 MED ORDER — NITROGLYCERIN 0.4 MG SL SUBL
SUBLINGUAL_TABLET | SUBLINGUAL | Status: AC
  Administered 2022-10-17: 0.8 mg via SUBLINGUAL
  Filled 2022-10-17: qty 2

## 2022-10-17 MED ORDER — IOHEXOL 350 MG/ML SOLN
95.0000 mL | Freq: Once | INTRAVENOUS | Status: AC | PRN
  Administered 2022-10-17: 95 mL via INTRAVENOUS

## 2022-10-17 MED ORDER — NITROGLYCERIN 0.4 MG SL SUBL: 0.8000 mg | SUBLINGUAL_TABLET | Freq: Once | SUBLINGUAL | Status: AC

## 2022-10-17 MED ORDER — DILTIAZEM HCL 25 MG/5ML IV SOLN
INTRAVENOUS | Status: AC
  Administered 2022-10-17: 5 mg via INTRAVENOUS
  Filled 2022-10-17: qty 5

## 2022-10-17 MED ORDER — METOPROLOL TARTRATE 5 MG/5ML IV SOLN
10.0000 mg | INTRAVENOUS | Status: DC | PRN
  Administered 2022-10-17: 10 mg via INTRAVENOUS

## 2022-10-17 MED ORDER — NITROGLYCERIN 0.4 MG SL SUBL
0.8000 mg | SUBLINGUAL_TABLET | Freq: Once | SUBLINGUAL | Status: AC
  Administered 2022-10-17: 0.8 mg via SUBLINGUAL

## 2022-10-17 NOTE — Progress Notes (Signed)
This patient has received approx 105 ml's of IV omni350 contrast extravasation into his right antecubital during a CT Coronary exam.  The exam was performed on (date) Tuesday, 10/17/22.  Site / affected area assessed by Soyla Dryer, Radiology NP

## 2022-10-17 NOTE — Progress Notes (Signed)
  Evaluation after Contrast Extravasation  Patient seen and examined immediately after contrast extravasation while in [facility] CT. Approximately 105 ml contrast extravasated into the right upper arm.   Exam: There is moderate swelling and tenderness at the right [facility] area.  There is no erythema. There is no discoloration. There are no blisters. There are no signs of decreased perfusion of the skin.  It is warm to touch.  The patient has full ROM in fingers.  Radial pulse is normal.  Per contrast extravasation protocol, I have instructed the patient to keep an ice pack on the area for 20-60 minutes at a time for about 48 hours.   Keep arm elevated as much as possible.   The patient understands to call the radiology department if there is: - increase in pain or swelling - changed or altered sensation - ulceration or blistering - increasing redness - warmth or increasing firmness - decreased tissue perfusion as noted by decreased capillary refill or discoloration of skin - decreased pulses peripheral to site   Theresa Duty, NP 10/17/2022 3:12 PM

## 2022-10-18 ENCOUNTER — Telehealth: Payer: Self-pay | Admitting: Cardiology

## 2022-10-18 ENCOUNTER — Other Ambulatory Visit: Payer: Self-pay | Admitting: Cardiology

## 2022-10-18 ENCOUNTER — Other Ambulatory Visit (HOSPITAL_COMMUNITY): Payer: Self-pay | Admitting: *Deleted

## 2022-10-18 DIAGNOSIS — R931 Abnormal findings on diagnostic imaging of heart and coronary circulation: Secondary | ICD-10-CM

## 2022-10-18 DIAGNOSIS — I10 Essential (primary) hypertension: Secondary | ICD-10-CM

## 2022-10-18 DIAGNOSIS — R079 Chest pain, unspecified: Secondary | ICD-10-CM

## 2022-10-18 DIAGNOSIS — R0609 Other forms of dyspnea: Secondary | ICD-10-CM

## 2022-10-18 NOTE — Telephone Encounter (Signed)
   Pt is calling to get CT result  

## 2022-10-18 NOTE — Telephone Encounter (Signed)
CT FFR results showed nonobstructive stenosis in the LAD and RCA.  Good news!!!  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  PRN Coronary Calcium Score means that we need to be aggressive with treating cholesterol levels.  What it does mean is that calcification is probably indicative of what we call more stabilization pattern.   Glenetta Hew, MD

## 2022-10-18 NOTE — Telephone Encounter (Signed)
I called and spoke with the patient. I advised him that the results of his Cardiac CT are still pending Dr. Allison Quarry review.   He is aware we will be in touch with him once results are reviewed.  The patient voices understanding and is agreeable.

## 2022-10-19 ENCOUNTER — Ambulatory Visit (HOSPITAL_BASED_OUTPATIENT_CLINIC_OR_DEPARTMENT_OTHER)
Admission: RE | Admit: 2022-10-19 | Discharge: 2022-10-19 | Disposition: A | Discharge status: Home / Self Care | Payer: Medicaid Other | Source: Ambulatory Visit | Attending: Cardiology | Admitting: Cardiology

## 2022-10-19 DIAGNOSIS — R072 Precordial pain: Secondary | ICD-10-CM | POA: Diagnosis not present

## 2022-10-19 DIAGNOSIS — R931 Abnormal findings on diagnostic imaging of heart and coronary circulation: Secondary | ICD-10-CM | POA: Diagnosis not present

## 2022-10-19 DIAGNOSIS — Z79899 Other long term (current) drug therapy: Secondary | ICD-10-CM | POA: Diagnosis not present

## 2022-10-20 NOTE — Telephone Encounter (Signed)
  Pt is calling back to get result  

## 2022-10-20 NOTE — Telephone Encounter (Signed)
I called and spoke with the patient regarding his Cardiac CT report and Dr. Allison Quarry response in regards to his results as stated below.  I reviewed the patient's chart and confirmed he has had intolerances to multiple statins (muscle/ joint aches).   He is currently prescribed Vascepa, but had run out and is getting this refilled.  He has not had his labs drawn per Dr. Ellyn Hack as ordered at his office visit on 09/27/22 (CMP/ Lipid/ LPa/ CBC). I have asked the patient to please come to the [place] at [facility] to have these done at his convenience.   He is aware he will need to be fasting for at lease 8 hours prior to coming in for the lab draw.  The patient voices understanding of the above results and recommendations and is agreeable.

## 2022-11-06 DIAGNOSIS — F17210 Nicotine dependence, cigarettes, uncomplicated: Secondary | ICD-10-CM | POA: Diagnosis not present

## 2022-11-06 DIAGNOSIS — I10 Essential (primary) hypertension: Secondary | ICD-10-CM | POA: Diagnosis not present

## 2022-11-06 DIAGNOSIS — E7800 Pure hypercholesterolemia, unspecified: Secondary | ICD-10-CM | POA: Diagnosis not present

## 2022-11-06 DIAGNOSIS — F17200 Nicotine dependence, unspecified, uncomplicated: Secondary | ICD-10-CM | POA: Diagnosis not present

## 2022-11-06 DIAGNOSIS — R059 Cough, unspecified: Secondary | ICD-10-CM | POA: Diagnosis not present

## 2022-11-06 DIAGNOSIS — J0181 Other acute recurrent sinusitis: Secondary | ICD-10-CM | POA: Diagnosis not present

## 2022-11-06 DIAGNOSIS — Z6841 Body Mass Index (BMI) 40.0 and over, adult: Secondary | ICD-10-CM | POA: Diagnosis not present

## 2022-11-14 DIAGNOSIS — E119 Type 2 diabetes mellitus without complications: Secondary | ICD-10-CM | POA: Diagnosis not present

## 2022-11-14 DIAGNOSIS — F17210 Nicotine dependence, cigarettes, uncomplicated: Secondary | ICD-10-CM | POA: Diagnosis not present

## 2022-11-14 DIAGNOSIS — E559 Vitamin D deficiency, unspecified: Secondary | ICD-10-CM | POA: Diagnosis not present

## 2022-11-14 DIAGNOSIS — Z5320 Procedure and treatment not carried out because of patient's decision for unspecified reasons: Secondary | ICD-10-CM | POA: Diagnosis not present

## 2022-11-14 DIAGNOSIS — M545 Low back pain, unspecified: Secondary | ICD-10-CM | POA: Diagnosis not present

## 2022-11-14 DIAGNOSIS — R7989 Other specified abnormal findings of blood chemistry: Secondary | ICD-10-CM | POA: Diagnosis not present

## 2022-11-14 DIAGNOSIS — Z79899 Other long term (current) drug therapy: Secondary | ICD-10-CM | POA: Diagnosis not present

## 2022-11-14 DIAGNOSIS — I10 Essential (primary) hypertension: Secondary | ICD-10-CM | POA: Diagnosis not present

## 2022-11-16 DIAGNOSIS — Z79899 Other long term (current) drug therapy: Secondary | ICD-10-CM | POA: Diagnosis not present

## 2022-11-17 ENCOUNTER — Ambulatory Visit: Payer: Medicaid Other

## 2022-12-19 DIAGNOSIS — R7989 Other specified abnormal findings of blood chemistry: Secondary | ICD-10-CM | POA: Diagnosis not present

## 2022-12-19 DIAGNOSIS — E559 Vitamin D deficiency, unspecified: Secondary | ICD-10-CM | POA: Diagnosis not present

## 2022-12-19 DIAGNOSIS — I10 Essential (primary) hypertension: Secondary | ICD-10-CM | POA: Diagnosis not present

## 2022-12-19 DIAGNOSIS — Z79899 Other long term (current) drug therapy: Secondary | ICD-10-CM | POA: Diagnosis not present

## 2022-12-19 DIAGNOSIS — E119 Type 2 diabetes mellitus without complications: Secondary | ICD-10-CM | POA: Diagnosis not present

## 2022-12-19 DIAGNOSIS — F17210 Nicotine dependence, cigarettes, uncomplicated: Secondary | ICD-10-CM | POA: Diagnosis not present

## 2022-12-19 DIAGNOSIS — M545 Low back pain, unspecified: Secondary | ICD-10-CM | POA: Diagnosis not present

## 2022-12-21 DIAGNOSIS — Z79899 Other long term (current) drug therapy: Secondary | ICD-10-CM | POA: Diagnosis not present

## 2022-12-26 DIAGNOSIS — I10 Essential (primary) hypertension: Secondary | ICD-10-CM | POA: Diagnosis not present

## 2022-12-26 DIAGNOSIS — F17210 Nicotine dependence, cigarettes, uncomplicated: Secondary | ICD-10-CM | POA: Diagnosis not present

## 2022-12-26 DIAGNOSIS — R7989 Other specified abnormal findings of blood chemistry: Secondary | ICD-10-CM | POA: Diagnosis not present

## 2022-12-26 DIAGNOSIS — E119 Type 2 diabetes mellitus without complications: Secondary | ICD-10-CM | POA: Diagnosis not present

## 2022-12-26 DIAGNOSIS — Z79899 Other long term (current) drug therapy: Secondary | ICD-10-CM | POA: Diagnosis not present

## 2022-12-26 DIAGNOSIS — E559 Vitamin D deficiency, unspecified: Secondary | ICD-10-CM | POA: Diagnosis not present

## 2022-12-26 DIAGNOSIS — M545 Low back pain, unspecified: Secondary | ICD-10-CM | POA: Diagnosis not present

## 2022-12-28 DIAGNOSIS — Z79899 Other long term (current) drug therapy: Secondary | ICD-10-CM | POA: Diagnosis not present

## 2023-01-02 ENCOUNTER — Ambulatory Visit: Payer: Medicaid Other

## 2023-01-02 DIAGNOSIS — F17210 Nicotine dependence, cigarettes, uncomplicated: Secondary | ICD-10-CM | POA: Diagnosis not present

## 2023-01-02 DIAGNOSIS — E559 Vitamin D deficiency, unspecified: Secondary | ICD-10-CM | POA: Diagnosis not present

## 2023-01-02 DIAGNOSIS — E119 Type 2 diabetes mellitus without complications: Secondary | ICD-10-CM | POA: Diagnosis not present

## 2023-01-02 DIAGNOSIS — M545 Low back pain, unspecified: Secondary | ICD-10-CM | POA: Diagnosis not present

## 2023-01-02 DIAGNOSIS — R7989 Other specified abnormal findings of blood chemistry: Secondary | ICD-10-CM | POA: Diagnosis not present

## 2023-01-02 DIAGNOSIS — I10 Essential (primary) hypertension: Secondary | ICD-10-CM | POA: Diagnosis not present

## 2023-01-02 DIAGNOSIS — Z79899 Other long term (current) drug therapy: Secondary | ICD-10-CM | POA: Diagnosis not present

## 2023-01-04 DIAGNOSIS — F17200 Nicotine dependence, unspecified, uncomplicated: Secondary | ICD-10-CM | POA: Diagnosis not present

## 2023-01-04 DIAGNOSIS — E291 Testicular hypofunction: Secondary | ICD-10-CM | POA: Diagnosis not present

## 2023-01-04 DIAGNOSIS — E7800 Pure hypercholesterolemia, unspecified: Secondary | ICD-10-CM | POA: Diagnosis not present

## 2023-01-04 DIAGNOSIS — R030 Elevated blood-pressure reading, without diagnosis of hypertension: Secondary | ICD-10-CM | POA: Diagnosis not present

## 2023-01-04 DIAGNOSIS — F17210 Nicotine dependence, cigarettes, uncomplicated: Secondary | ICD-10-CM | POA: Diagnosis not present

## 2023-01-04 DIAGNOSIS — Z6841 Body Mass Index (BMI) 40.0 and over, adult: Secondary | ICD-10-CM | POA: Diagnosis not present

## 2023-01-04 DIAGNOSIS — Z760 Encounter for issue of repeat prescription: Secondary | ICD-10-CM | POA: Diagnosis not present

## 2023-01-04 DIAGNOSIS — Z79899 Other long term (current) drug therapy: Secondary | ICD-10-CM | POA: Diagnosis not present

## 2023-01-04 DIAGNOSIS — E119 Type 2 diabetes mellitus without complications: Secondary | ICD-10-CM | POA: Diagnosis not present

## 2023-01-04 DIAGNOSIS — I10 Essential (primary) hypertension: Secondary | ICD-10-CM | POA: Diagnosis not present

## 2023-01-11 ENCOUNTER — Telehealth: Payer: Self-pay | Admitting: *Deleted

## 2023-01-11 NOTE — Telephone Encounter (Signed)
-----   Message from Britt Bottom, [place] sent at 01/10/2023 11:13 AM EDT ----- Pt has f/u with Dr. Ellyn Hack 4/11. Pt pending lab work. Please advise if pt should have lab work drawn before appointment. Thank you!

## 2023-01-11 NOTE — Telephone Encounter (Signed)
Attempted to call the patient in regards to having updated lab work. No answer- no voice mail set up.   Will attempt to reach the patient at a later time.  He is scheduled for a follow with Dr. Ellyn Hack on 4/11 & an echo on 4/9.

## 2023-01-16 ENCOUNTER — Ambulatory Visit: Payer: Medicaid Other

## 2023-01-16 NOTE — Telephone Encounter (Signed)
Reviewed the patient's chart. He was scheduled for his echo to be done today 4/9 @ 11:30 am, but has rescheduled this to 5/9. Secure chat sent to [place], Dr. Elissa Hefty scheduler to please reach out to the patient to r/s his 01/18/23 office visit with Dr. Herbie Baltimore until all testing has been completed.

## 2023-01-17 ENCOUNTER — Ambulatory Visit: Payer: Medicaid Other | Admitting: Cardiology

## 2023-01-18 ENCOUNTER — Ambulatory Visit: Payer: Medicaid Other | Admitting: Cardiology

## 2023-01-23 NOTE — Telephone Encounter (Signed)
Patient was unable to be contacted by scheduling to r/s his follow up on 01/18/23. However, he also did not show for this visit.

## 2023-02-01 DIAGNOSIS — M545 Low back pain, unspecified: Secondary | ICD-10-CM | POA: Diagnosis not present

## 2023-02-01 DIAGNOSIS — F17210 Nicotine dependence, cigarettes, uncomplicated: Secondary | ICD-10-CM | POA: Diagnosis not present

## 2023-02-01 DIAGNOSIS — Z79899 Other long term (current) drug therapy: Secondary | ICD-10-CM | POA: Diagnosis not present

## 2023-02-05 DIAGNOSIS — G4733 Obstructive sleep apnea (adult) (pediatric): Secondary | ICD-10-CM | POA: Diagnosis not present

## 2023-02-05 DIAGNOSIS — Z79899 Other long term (current) drug therapy: Secondary | ICD-10-CM | POA: Diagnosis not present

## 2023-02-15 ENCOUNTER — Observation Stay: Payer: Medicaid Other

## 2023-03-02 DIAGNOSIS — Z79899 Other long term (current) drug therapy: Secondary | ICD-10-CM | POA: Diagnosis not present

## 2023-03-02 DIAGNOSIS — M545 Low back pain, unspecified: Secondary | ICD-10-CM | POA: Diagnosis not present

## 2023-03-02 DIAGNOSIS — R7989 Other specified abnormal findings of blood chemistry: Secondary | ICD-10-CM | POA: Diagnosis not present

## 2023-03-02 DIAGNOSIS — E119 Type 2 diabetes mellitus without complications: Secondary | ICD-10-CM | POA: Diagnosis not present

## 2023-03-02 DIAGNOSIS — I10 Essential (primary) hypertension: Secondary | ICD-10-CM | POA: Diagnosis not present

## 2023-03-02 DIAGNOSIS — F17210 Nicotine dependence, cigarettes, uncomplicated: Secondary | ICD-10-CM | POA: Diagnosis not present

## 2023-03-02 DIAGNOSIS — E559 Vitamin D deficiency, unspecified: Secondary | ICD-10-CM | POA: Diagnosis not present

## 2023-03-06 DIAGNOSIS — Z79899 Other long term (current) drug therapy: Secondary | ICD-10-CM | POA: Diagnosis not present

## 2023-03-30 ENCOUNTER — Ambulatory Visit: Payer: Medicaid Other | Attending: Cardiology

## 2023-03-30 DIAGNOSIS — R0609 Other forms of dyspnea: Secondary | ICD-10-CM | POA: Diagnosis not present

## 2023-03-30 DIAGNOSIS — F17210 Nicotine dependence, cigarettes, uncomplicated: Secondary | ICD-10-CM

## 2023-03-30 DIAGNOSIS — R072 Precordial pain: Secondary | ICD-10-CM | POA: Diagnosis not present

## 2023-03-30 MED ORDER — PERFLUTREN LIPID MICROSPHERE
1.0000 mL | INTRAVENOUS | Status: AC | PRN
  Administered 2023-03-30: 2 mL via INTRAVENOUS

## 2023-03-31 LAB — ECHOCARDIOGRAM COMPLETE
AR max vel: 2.8 cm2
AV Area VTI: 2.79 cm2
AV Area mean vel: 2.78 cm2
AV Mean grad: 6 mmHg
AV Peak grad: 11.6 mmHg
Ao pk vel: 1.7 m/s
Area-P 1/2: 4.06 cm2

## 2023-04-02 DIAGNOSIS — M545 Low back pain, unspecified: Secondary | ICD-10-CM | POA: Diagnosis not present

## 2023-04-02 DIAGNOSIS — Z79899 Other long term (current) drug therapy: Secondary | ICD-10-CM | POA: Diagnosis not present

## 2023-04-02 DIAGNOSIS — I10 Essential (primary) hypertension: Secondary | ICD-10-CM | POA: Diagnosis not present

## 2023-04-02 DIAGNOSIS — F17210 Nicotine dependence, cigarettes, uncomplicated: Secondary | ICD-10-CM | POA: Diagnosis not present

## 2023-04-02 DIAGNOSIS — E559 Vitamin D deficiency, unspecified: Secondary | ICD-10-CM | POA: Diagnosis not present

## 2023-04-02 DIAGNOSIS — R7989 Other specified abnormal findings of blood chemistry: Secondary | ICD-10-CM | POA: Diagnosis not present

## 2023-04-02 DIAGNOSIS — E119 Type 2 diabetes mellitus without complications: Secondary | ICD-10-CM | POA: Diagnosis not present

## 2023-04-04 DIAGNOSIS — Z79899 Other long term (current) drug therapy: Secondary | ICD-10-CM | POA: Diagnosis not present

## 2023-04-13 ENCOUNTER — Telehealth: Payer: Self-pay | Admitting: Cardiology

## 2023-04-13 NOTE — Telephone Encounter (Signed)
Bryan Lex, MD 04/07/2023 11:10 PM EDT     Echocardiogram shows normal pump function with an ejection fraction of 60 to 65% and no wall motion abnormalities.  (Ejection fraction hide and EF is the percentage of blood that the heart fills up with that is pumped out; normal range is 50 to 70%.) Ventricle appears to be relaxing okay.  The the right-sided pressures are also normal.  All valves are normal   Essentially normal study.   Bryan Lemma, MD

## 2023-04-13 NOTE — Telephone Encounter (Signed)
Jefferey Pica, RN 04/09/2023  9:56 AM EDT     Results released to MyChart by Dr. Herbie Baltimore on 04/07/2023 11:10 PM EDT.

## 2023-04-13 NOTE — Telephone Encounter (Signed)
The patient has not reviewed his results in MyChart. Attempted to call the patient, no answer & no voice mail set up. Will call back at a later time.   He is also over due for follow up with Dr. Herbie Baltimore.

## 2023-04-16 NOTE — Telephone Encounter (Signed)
Bryna Colander, RN 04/16/2023  3:32 PM EDT Back to Top    No answer/No voicemail

## 2023-04-18 ENCOUNTER — Encounter: Payer: Self-pay | Admitting: *Deleted

## 2023-04-18 NOTE — Telephone Encounter (Signed)
Attempted to call the patient.  No answer- No voice mail set up.  Letter mailed to the patient.

## 2023-05-02 DIAGNOSIS — E119 Type 2 diabetes mellitus without complications: Secondary | ICD-10-CM | POA: Diagnosis not present

## 2023-05-02 DIAGNOSIS — Z79899 Other long term (current) drug therapy: Secondary | ICD-10-CM | POA: Diagnosis not present

## 2023-05-02 DIAGNOSIS — I10 Essential (primary) hypertension: Secondary | ICD-10-CM | POA: Diagnosis not present

## 2023-05-02 DIAGNOSIS — E559 Vitamin D deficiency, unspecified: Secondary | ICD-10-CM | POA: Diagnosis not present

## 2023-05-02 DIAGNOSIS — M545 Low back pain, unspecified: Secondary | ICD-10-CM | POA: Diagnosis not present

## 2023-05-02 DIAGNOSIS — F17210 Nicotine dependence, cigarettes, uncomplicated: Secondary | ICD-10-CM | POA: Diagnosis not present

## 2023-05-02 DIAGNOSIS — R7989 Other specified abnormal findings of blood chemistry: Secondary | ICD-10-CM | POA: Diagnosis not present

## 2023-05-04 DIAGNOSIS — R030 Elevated blood-pressure reading, without diagnosis of hypertension: Secondary | ICD-10-CM | POA: Diagnosis not present

## 2023-05-04 DIAGNOSIS — F17210 Nicotine dependence, cigarettes, uncomplicated: Secondary | ICD-10-CM | POA: Diagnosis not present

## 2023-05-04 DIAGNOSIS — F17200 Nicotine dependence, unspecified, uncomplicated: Secondary | ICD-10-CM | POA: Diagnosis not present

## 2023-05-04 DIAGNOSIS — Z760 Encounter for issue of repeat prescription: Secondary | ICD-10-CM | POA: Diagnosis not present

## 2023-05-04 DIAGNOSIS — E7800 Pure hypercholesterolemia, unspecified: Secondary | ICD-10-CM | POA: Diagnosis not present

## 2023-05-04 DIAGNOSIS — I10 Essential (primary) hypertension: Secondary | ICD-10-CM | POA: Diagnosis not present

## 2023-05-04 DIAGNOSIS — E119 Type 2 diabetes mellitus without complications: Secondary | ICD-10-CM | POA: Diagnosis not present

## 2023-05-04 DIAGNOSIS — E291 Testicular hypofunction: Secondary | ICD-10-CM | POA: Diagnosis not present

## 2023-05-04 DIAGNOSIS — Z6841 Body Mass Index (BMI) 40.0 and over, adult: Secondary | ICD-10-CM | POA: Diagnosis not present

## 2023-05-04 DIAGNOSIS — Z79899 Other long term (current) drug therapy: Secondary | ICD-10-CM | POA: Diagnosis not present

## 2023-06-27 ENCOUNTER — Ambulatory Visit (INDEPENDENT_AMBULATORY_CARE_PROVIDER_SITE_OTHER)
Admission: RE | Admit: 2023-06-27 | Discharge: 2023-06-27 | Disposition: A | Discharge status: Home / Self Care | Payer: Medicaid Other | Source: Ambulatory Visit | Attending: Nurse Practitioner | Admitting: Nurse Practitioner

## 2023-06-27 ENCOUNTER — Ambulatory Visit: Payer: Medicaid Other | Admitting: Nurse Practitioner

## 2023-06-27 ENCOUNTER — Encounter: Payer: Self-pay | Admitting: Nurse Practitioner

## 2023-06-27 DIAGNOSIS — Z125 Encounter for screening for malignant neoplasm of prostate: Secondary | ICD-10-CM

## 2023-06-27 DIAGNOSIS — M79644 Pain in right finger(s): Secondary | ICD-10-CM

## 2023-06-27 DIAGNOSIS — R0602 Shortness of breath: Secondary | ICD-10-CM

## 2023-06-27 DIAGNOSIS — M25572 Pain in left ankle and joints of left foot: Secondary | ICD-10-CM

## 2023-06-27 DIAGNOSIS — R600 Localized edema: Secondary | ICD-10-CM | POA: Insufficient documentation

## 2023-06-27 DIAGNOSIS — I10 Essential (primary) hypertension: Secondary | ICD-10-CM

## 2023-06-27 DIAGNOSIS — R0789 Other chest pain: Secondary | ICD-10-CM

## 2023-06-27 DIAGNOSIS — K219 Gastro-esophageal reflux disease without esophagitis: Secondary | ICD-10-CM | POA: Diagnosis not present

## 2023-06-27 DIAGNOSIS — Z1211 Encounter for screening for malignant neoplasm of colon: Secondary | ICD-10-CM

## 2023-06-27 DIAGNOSIS — Z981 Arthrodesis status: Secondary | ICD-10-CM | POA: Diagnosis not present

## 2023-06-27 DIAGNOSIS — R7989 Other specified abnormal findings of blood chemistry: Secondary | ICD-10-CM | POA: Diagnosis not present

## 2023-06-27 DIAGNOSIS — Z7689 Persons encountering health services in other specified circumstances: Secondary | ICD-10-CM | POA: Insufficient documentation

## 2023-06-27 DIAGNOSIS — S62306A Unspecified fracture of fifth metacarpal bone, right hand, initial encounter for closed fracture: Secondary | ICD-10-CM | POA: Diagnosis not present

## 2023-06-27 DIAGNOSIS — M19041 Primary osteoarthritis, right hand: Secondary | ICD-10-CM | POA: Diagnosis not present

## 2023-06-27 LAB — POCT URINALYSIS DIPSTICK
Bilirubin, UA: NEGATIVE
Blood, UA: NEGATIVE
Glucose, UA: NEGATIVE
Ketones, UA: POSITIVE
Leukocytes, UA: NEGATIVE
Nitrite, UA: NEGATIVE
Protein, UA: POSITIVE — AB
Spec Grav, UA: 1.015 (ref 1.010–1.025)
Urobilinogen, UA: 1 U/dL
pH, UA: 6 (ref 5.0–8.0)
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
## 2023-06-27 NOTE — Assessment & Plan Note (Signed)
Patient new to me.  Unable to review previous records

## 2023-06-27 NOTE — Assessment & Plan Note (Signed)
Patient will use spironolactone as needed.  Pending BNP

## 2023-06-27 NOTE — Assessment & Plan Note (Signed)
Patient currently maintained on Dexilant 60 mg daily.  Symptoms well-controlled with very minimal breakthrough continue
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
## 2023-06-27 NOTE — Assessment & Plan Note (Signed)
Patient states he is doing testosterone 100 mg weekly at home.  To add today to check testosterone level.  Check CBC if breathing does not improve consider ruling out blood clot

## 2023-06-27 NOTE — Assessment & Plan Note (Signed)
Patient is interested in weight loss.  We did discuss a GLP-1 or GLP-1/GIP receptor agonist.  Pending labs no history of medullary thyroid cancer or multiple endocrine neoplasia syndrome type II
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
## 2023-06-27 NOTE — Patient Instructions (Signed)
Nice to see you today I will be in touch with the labs and xrays once I have them Follow up with me in 3 months, sooner if you need me

## 2023-06-28 LAB — CBC
HCT: 40.3 % (ref 39.0–52.0)
Hemoglobin: 12.9 g/dL — ABNORMAL LOW (ref 13.0–17.0)
MCHC: 32.1 g/dL (ref 30.0–36.0)
MCV: 84.7 fl (ref 78.0–100.0)
Platelets: 254 10*3/uL (ref 150.0–400.0)
RBC: 4.76 Mil/uL (ref 4.22–5.81)
RDW: 20 % — ABNORMAL HIGH (ref 11.5–15.5)
WBC: 10.7 10*3/uL — ABNORMAL HIGH (ref 4.0–10.5)

## 2023-06-28 LAB — PSA: PSA: 0.36 ng/mL (ref 0.10–4.00)

## 2023-06-28 LAB — COMPREHENSIVE METABOLIC PANEL
ALT: 48 U/L (ref 0–53)
AST: 113 U/L — ABNORMAL HIGH (ref 0–37)
Albumin: 4.6 g/dL (ref 3.5–5.2)
Alkaline Phosphatase: 79 U/L (ref 39–117)
BUN: 8 mg/dL (ref 6–23)
CO2: 27 mEq/L (ref 19–32)
Calcium: 9.8 mg/dL (ref 8.4–10.5)
Chloride: 91 mEq/L — ABNORMAL LOW (ref 96–112)
Creatinine, Ser: 0.94 mg/dL (ref 0.40–1.50)
GFR: 92.06 mL/min (ref >60.00)
Glucose, Bld: 118 mg/dL — ABNORMAL HIGH (ref 70–99)
Potassium: 3.7 mEq/L (ref 3.5–5.1)
Sodium: 133 mEq/L — ABNORMAL LOW (ref 135–145)
Total Bilirubin: 0.8 mg/dL (ref 0.2–1.2)
Total Protein: 8 g/dL (ref 6.0–8.3)

## 2023-06-28 LAB — LIPID PANEL
Cholesterol: 230 mg/dL — ABNORMAL HIGH (ref 0–200)
HDL: 40.1 mg/dL (ref >39.00)
LDL Cholesterol: 136 mg/dL — ABNORMAL HIGH (ref 0–99)
NonHDL: 190.34
Total CHOL/HDL Ratio: 6
Triglycerides: 274 mg/dL — ABNORMAL HIGH (ref 0.0–149.0)
VLDL: 54.8 mg/dL — ABNORMAL HIGH (ref 0.0–40.0)

## 2023-06-28 LAB — TSH: TSH: 3.3 u[IU]/mL (ref 0.35–5.50)

## 2023-06-28 LAB — HEMOGLOBIN A1C: Hgb A1c MFr Bld: 6.7 % — ABNORMAL HIGH (ref 4.6–6.5)

## 2023-06-28 LAB — BRAIN NATRIURETIC PEPTIDE: Pro B Natriuretic peptide (BNP): 245 pg/mL — ABNORMAL HIGH (ref 0.0–100.0)

## 2023-07-02 ENCOUNTER — Telehealth: Payer: Self-pay | Admitting: Nurse Practitioner

## 2023-07-02 ENCOUNTER — Encounter: Payer: Self-pay | Admitting: *Deleted

## 2023-07-02 ENCOUNTER — Other Ambulatory Visit: Payer: Self-pay | Admitting: Nurse Practitioner

## 2023-07-02 DIAGNOSIS — E1165 Type 2 diabetes mellitus with hyperglycemia: Secondary | ICD-10-CM

## 2023-07-02 DIAGNOSIS — R600 Localized edema: Secondary | ICD-10-CM

## 2023-07-02 DIAGNOSIS — R0602 Shortness of breath: Secondary | ICD-10-CM

## 2023-07-02 DIAGNOSIS — M79644 Pain in right finger(s): Secondary | ICD-10-CM

## 2023-07-02 DIAGNOSIS — E785 Hyperlipidemia, unspecified: Secondary | ICD-10-CM

## 2023-07-02 DIAGNOSIS — Z794 Long term (current) use of insulin: Secondary | ICD-10-CM

## 2023-07-02 DIAGNOSIS — I10 Essential (primary) hypertension: Secondary | ICD-10-CM

## 2023-07-02 MED ORDER — OZEMPIC (0.25 OR 0.5 MG/DOSE) 2 MG/3ML ~~LOC~~ SOPN: 0.2500 mg | PEN_INJECTOR | SUBCUTANEOUS | 0 refills | Status: DC

## 2023-07-02 MED ORDER — FUROSEMIDE 20 MG PO TABS: 20.0000 mg | ORAL_TABLET | Freq: Every day | ORAL | 0 refills | Status: DC
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
## 2023-07-02 NOTE — Telephone Encounter (Signed)
-----   Message from [facility] T sent at 07/02/2023  9:25 AM EDT ----- Called patient reviewed all information and repeated back to me. Will call if any questions.   Patient declines seeing diabetes educator states that the only place that has ever told him that he had any blood sugar issues is when he goes to a [facility] office. He went to [place] every month to the pain clinic and they checked his labs and never told him his blood sugar was high. Advised patient that I am only reviewed Matts recommendations from recent labs. I don't have access to his past labs to give any input on that. He would like to start the weight loss shot that you had talked about with him at visit.  He states he was on Vascepa in the past for his cholesterol but has been out for a few months. Willing to start back on that if you want him.   Patient declined to make lab appointment would like to have done when he is in office Wednesday if he can. Advised he can review with Matt at time of visit. States that he is currently taking spironolactone when his feet swell but has not needed it recently. Advise patient that he needs to take the Lasix you have sent in as directed. Agreed with direction and will pick up today.
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
## 2023-07-02 NOTE — Telephone Encounter (Signed)
I am ok starting ozempic since he is diabetic. I have sent two messages in my pool about the labs and about the xrays.

## 2023-07-02 NOTE — Telephone Encounter (Signed)
Called patient reviewed all information from labs as well as x ray results. His only complaint was that he needs to loose weight. That he is not able to move around much. I didn't see this message until after I had reviewed everything with him. Called back to get more information but patient did not answer and did not have voicemail set up. Weill call back later.

## 2023-07-02 NOTE — Telephone Encounter (Signed)
Will discuss further in office on Wednesday

## 2023-07-02 NOTE — Telephone Encounter (Signed)
-----   Message from [facility] [place] T sent at 07/02/2023  9:08 AM EDT ----- Called patient reviewed all information and repeated back to me. Will call if any questions.  He would like to see someone in [place].

## 2023-07-02 NOTE — Addendum Note (Signed)
Addended by: Eden Emms on: 07/02/2023 01:08 PM   Modules accepted: Orders

## 2023-07-03 ENCOUNTER — Other Ambulatory Visit: Payer: Self-pay

## 2023-07-03 ENCOUNTER — Emergency Department (HOSPITAL_COMMUNITY): Payer: Medicaid Other

## 2023-07-03 ENCOUNTER — Emergency Department (HOSPITAL_COMMUNITY)
Admission: EM | Admit: 2023-07-03 | Discharge: 2023-07-03 | Disposition: A | Discharge status: Home / Self Care | Payer: Medicaid Other | Attending: Emergency Medicine | Admitting: Emergency Medicine

## 2023-07-03 ENCOUNTER — Encounter (HOSPITAL_COMMUNITY): Payer: Self-pay

## 2023-07-03 DIAGNOSIS — J45909 Unspecified asthma, uncomplicated: Secondary | ICD-10-CM | POA: Diagnosis not present

## 2023-07-03 DIAGNOSIS — L03114 Cellulitis of left upper limb: Secondary | ICD-10-CM | POA: Insufficient documentation

## 2023-07-03 DIAGNOSIS — I10 Essential (primary) hypertension: Secondary | ICD-10-CM | POA: Insufficient documentation

## 2023-07-03 DIAGNOSIS — M79632 Pain in left forearm: Secondary | ICD-10-CM | POA: Diagnosis present

## 2023-07-03 DIAGNOSIS — M79602 Pain in left arm: Secondary | ICD-10-CM | POA: Diagnosis not present

## 2023-07-03 DIAGNOSIS — R0789 Other chest pain: Secondary | ICD-10-CM | POA: Insufficient documentation

## 2023-07-03 LAB — BASIC METABOLIC PANEL
Anion gap: 13 (ref 5–15)
BUN: 11 mg/dL (ref 6–20)
CO2: 25 mmol/L (ref 22–32)
Calcium: 9.1 mg/dL (ref 8.9–10.3)
Chloride: 94 mmol/L — ABNORMAL LOW (ref 98–111)
Creatinine, Ser: 0.73 mg/dL (ref 0.61–1.24)
GFR, Estimated: >60 mL/min (ref >60)
Glucose, Bld: 130 mg/dL — ABNORMAL HIGH (ref 70–99)
Potassium: 4.3 mmol/L (ref 3.5–5.1)
Sodium: 132 mmol/L — ABNORMAL LOW (ref 135–145)

## 2023-07-03 LAB — CBC
HCT: 35.9 % — ABNORMAL LOW (ref 39.0–52.0)
Hemoglobin: 11.3 g/dL — ABNORMAL LOW (ref 13.0–17.0)
MCH: 27 pg (ref 26.0–34.0)
MCHC: 31.5 g/dL (ref 30.0–36.0)
MCV: 85.9 fL (ref 80.0–100.0)
Platelets: 237 10*3/uL (ref 150–400)
RBC: 4.18 MIL/uL — ABNORMAL LOW (ref 4.22–5.81)
RDW: 19 % — ABNORMAL HIGH (ref 11.5–15.5)
WBC: 7.9 10*3/uL (ref 4.0–10.5)
nRBC: 0 % (ref 0.0–0.2)

## 2023-07-03 LAB — TROPONIN I (HIGH SENSITIVITY)
Troponin I (High Sensitivity): 10 ng/L (ref <18)
Troponin I (High Sensitivity): 5 ng/L (ref <18)

## 2023-07-03 MED ORDER — OXYCODONE-ACETAMINOPHEN 5-325 MG PO TABS
2.0000 | ORAL_TABLET | Freq: Once | ORAL | Status: AC
  Administered 2023-07-03: 2 via ORAL
  Filled 2023-07-03: qty 2

## 2023-07-03 MED ORDER — SULFAMETHOXAZOLE-TRIMETHOPRIM 800-160 MG PO TABS
1.0000 | ORAL_TABLET | Freq: Once | ORAL | Status: AC
  Administered 2023-07-03: 1 via ORAL
  Filled 2023-07-03: qty 1

## 2023-07-03 MED ORDER — SENNOSIDES-DOCUSATE SODIUM 8.6-50 MG PO TABS: 1.0000 | ORAL_TABLET | Freq: Every evening | ORAL | 0 refills | Status: DC | PRN

## 2023-07-03 MED ORDER — OXYCODONE-ACETAMINOPHEN 10-325 MG PO TABS: 1.0000 | ORAL_TABLET | Freq: Four times a day (QID) | ORAL | 0 refills | Status: DC | PRN

## 2023-07-03 MED ORDER — SULFAMETHOXAZOLE-TRIMETHOPRIM 800-160 MG PO TABS
1.0000 | ORAL_TABLET | Freq: Two times a day (BID) | ORAL | 0 refills | Status: AC
Start: 2023-07-03 — End: 2023-07-10

## 2023-07-03 NOTE — Discharge Instructions (Signed)
You have been seen today in the Emergency Department (ED) for cellulitis, a superficial skin infection. Please take your antibiotics as prescribed for their ENTIRE prescribed duration.  Take Tylenol or Motrin as needed for pain, but only as written on the box.   Please follow up with your doctor or in the ED in 24-48 hours for recheck of your infection if you are not improving.  Call your doctor sooner or return to the ED if you develop worsening signs of infection such as: increased redness, increased pain, pus, fever, or other symptoms that concern you.
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
## 2023-07-03 NOTE — ED Triage Notes (Signed)
Pt c/o pain in left arm. Pt has scabbed wound near left elbow. The area surrounding the scabbed wound is erythematous. Pt c/o left sided chest pain that radiates to mid upper backx2-3d.

## 2023-07-03 NOTE — Telephone Encounter (Signed)
No answer/voicemail not set up.

## 2023-07-04 ENCOUNTER — Ambulatory Visit: Payer: Medicaid Other | Admitting: Nurse Practitioner

## 2023-07-10 ENCOUNTER — Ambulatory Visit: Payer: Medicaid Other | Admitting: Nurse Practitioner

## 2023-07-10 ENCOUNTER — Telehealth: Payer: Self-pay

## 2023-07-10 VITALS — BP 128/80 | HR 62 | Temp 98.0°F | Ht 66.5 in | Wt 266.2 lb

## 2023-07-10 DIAGNOSIS — L03114 Cellulitis of left upper limb: Secondary | ICD-10-CM | POA: Diagnosis not present

## 2023-07-10 DIAGNOSIS — I10 Essential (primary) hypertension: Secondary | ICD-10-CM

## 2023-07-10 DIAGNOSIS — Z09 Encounter for follow-up examination after completed treatment for conditions other than malignant neoplasm: Secondary | ICD-10-CM | POA: Insufficient documentation

## 2023-07-10 MED ORDER — CEFTRIAXONE SODIUM 1 G IJ SOLR
1.0000 g | Freq: Once | INTRAMUSCULAR | Status: AC
Start: 2023-07-10 — End: 2023-07-10
  Administered 2023-07-10: 1 g via INTRAMUSCULAR

## 2023-07-10 MED ORDER — CEPHALEXIN 500 MG PO CAPS: 500.0000 mg | ORAL_CAPSULE | Freq: Three times a day (TID) | ORAL | 0 refills | Status: DC
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
## 2023-07-10 NOTE — Assessment & Plan Note (Signed)
Patient blood pressure better controlled at today's office visit.  He can take spironolactone as needed for lower extremity edema if his blood pressure continues to be elevated outside of office he can continue taking spironolactone daily

## 2023-07-10 NOTE — Assessment & Plan Note (Signed)
Patient recently finished Bactrim DS.  See clinical photos not back to baseline or resolution.  Will give Rocephin 1 g IM in office x 1 dose and start patient on Keflex 500 mg 3 times daily for 10 days patient will have close follow-up with me

## 2023-07-10 NOTE — Telephone Encounter (Signed)
Pharmacy Patient Advocate Encounter   Received notification from CoverMyMeds that prior authorization for Ozempic (0.25 or 0.5 MG/DOSE) 2MG /3ML pen-injectors is required/requested.   Insurance verification completed.   The patient is insured through [facility] .   Per test claim: PA required; PA submitted to [facility] via CoverMyMeds Key/confirmation #/EOC [id number] Status is pending

## 2023-07-10 NOTE — Telephone Encounter (Signed)
Contacted pharmacy in regards to sent In prescription. Pharmacy staff stated that are awaiting Prior Authorization In order to fill prescription.

## 2023-07-10 NOTE — Assessment & Plan Note (Signed)
Did review emergency department note, imaging, labs.

## 2023-07-10 NOTE — Patient Instructions (Signed)
Nice to see you today We gave a does of antibiotics in the office today I want to see you on Friday, sooner If you need me  Start the oral antibiotics tomorrow

## 2023-07-10 NOTE — Telephone Encounter (Signed)
PA request has been Submitted. New Encounter created for follow up. For additional info see Pharmacy Prior Auth telephone encounter from 07-10-2023.

## 2023-07-11 ENCOUNTER — Encounter: Payer: Self-pay | Admitting: *Deleted

## 2023-07-13 ENCOUNTER — Ambulatory Visit: Payer: Medicaid Other | Admitting: Nurse Practitioner

## 2023-07-13 VITALS — BP 134/76 | HR 65 | Temp 97.6°F | Wt 269.8 lb

## 2023-07-13 DIAGNOSIS — L03114 Cellulitis of left upper limb: Secondary | ICD-10-CM

## 2023-07-13 MED ORDER — CEFTRIAXONE SODIUM 1 G IJ SOLR
1.0000 g | Freq: Once | INTRAMUSCULAR | Status: AC
Start: 2023-07-13 — End: 2023-07-13
  Administered 2023-07-13: 1 g via INTRAMUSCULAR
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
## 2023-07-15 NOTE — Assessment & Plan Note (Signed)
Patient has had some improvement with the administration of Rocephin and starting the Keflex oral antibiotics.  We will redose patient with 1 g of Rocephin IM x 1 dose in office today.  He will continue taking the Keflex as prescribed.  Signs and symptoms reviewed when to be reevaluated.  If he does not improve he needs to follow-up.  Given improvement with 2 days of oral antibiotics I do think once he completes the course will have resolution.

## 2023-07-18 ENCOUNTER — Ambulatory Visit: Payer: Medicaid Other | Admitting: Nurse Practitioner

## 2023-07-18 ENCOUNTER — Encounter: Payer: Self-pay | Admitting: Nurse Practitioner

## 2023-07-18 DIAGNOSIS — L03114 Cellulitis of left upper limb: Secondary | ICD-10-CM

## 2023-07-18 MED ORDER — ICOSAPENT ETHYL 0.5 G PO CAPS: 1.0000 g | ORAL_CAPSULE | Freq: Two times a day (BID) | ORAL | 0 refills | Status: DC

## 2023-07-18 MED ORDER — CEPHALEXIN 500 MG PO CAPS: 500.0000 mg | ORAL_CAPSULE | Freq: Three times a day (TID) | ORAL | 0 refills | Status: AC

## 2023-07-18 NOTE — Assessment & Plan Note (Signed)
Improving.  Will extend Keflex 500 mg 3 times daily for an additional 7 days.  Follow-up if no continued improvement

## 2023-07-18 NOTE — Patient Instructions (Signed)
Nice to see you today I have extended the course of the antibiotics Follow up with me in 3 months, sooner if you need me

## 2023-07-18 NOTE — Progress Notes (Signed)
Established Patient Office Visit  Subjective   Patient ID: Bryan Huffman, male    DOB: 09-23-1971  Age: 52 y.o. MRN: [id number]  Chief Complaint  Patient presents with   Follow-up    Pt states still taking antibiotics . Minimal changes to symptoms.     HPI  Cellulitis: patient was seen in the emergency department on  He was place on bactrium and followed up with me on and placed on keflex with roecphin 1g IM in office. He has short term follow up and another dose of rocephin was given IM. He is here today as he is concerned it is not improving like it should.  Patient is not having any fever or chills.  States he is still having some swelling with the elbow is warm to the touch this is concerning to him.  States he has approximately 2-1/2 days left of the Keflex.      Review of Systems  Constitutional:  Negative for chills and fever.  Respiratory:  Negative for shortness of breath.   Cardiovascular:  Negative for chest pain.  Skin:  Positive for rash.  Neurological:  Negative for headaches.      Objective:     BP (!) 136/90   Pulse (!) 59   Temp 98 F (36.7 C) (Oral)   Ht 5' 6.05" (1.678 m)   Wt 275 lb 9.6 oz (125 kg)   SpO2 96%   BMI 44.42 kg/m    Physical Exam Vitals and nursing note reviewed.  Constitutional:      Appearance: Normal appearance.  Cardiovascular:     Rate and Rhythm: Normal rate and regular rhythm.     Heart sounds: Normal heart sounds.  Pulmonary:     Effort: Pulmonary effort is normal.     Breath sounds: Normal breath sounds.  Skin:    Comments: See clinical photo  19.5cm x 7.5cm this is measured at the far reach of proximal elbow at the largest point of induration on the forearm (proximal)  Neurological:     Mental Status: He is alert.         No results found for any visits on 07/18/23.    The 10-year ASCVD risk score (Arnett DK, et al., 2019) is: 17.6%    Assessment & Plan:   Problem List Items Addressed This Visit        Other   Cellulitis of left upper extremity    Improving.  Will extend Keflex 500 mg 3 times daily for an additional 7 days.  Follow-up if no continued improvement      Relevant Medications   cephALEXin (KEFLEX) 500 MG capsule    Return in about 3 months (around 10/18/2023) for DM recheck.    [name], NP

## 2023-07-25 NOTE — Telephone Encounter (Signed)
Carrie Mew from [facility] contacted the office regarding this PA request, states that patient has not heard anything regarding PA for this med. Caller states they show no record of PA being submitted to insurance company, advised that PA was submitted for this med, and last update shows as pending. Caller was confused, and says that she showed nothing was ever submitted. Is there an update on this PA status? Please advise patient if so.   [employer] says pt is anxious to start this medication and would like some advice on what to do until he starts this medication. Please advise patient on what to do.

## 2023-07-26 ENCOUNTER — Other Ambulatory Visit (HOSPITAL_COMMUNITY): Payer: Self-pay

## 2023-07-26 NOTE — Telephone Encounter (Signed)
Contacted pt in regards to approved PA. Pt has been made aware and has no questions or concerns.

## 2023-07-26 NOTE — Telephone Encounter (Signed)
Pharmacy Patient Advocate Encounter  Received notification from [facility] that Prior Authorization for Ozempic (0.25 or 0.5 MG/DOSE) 2MG /3ML pen-injectors  has been APPROVED from 07/26/23 to 07/25/24. Ran test claim, Copay is $4. This test claim was processed through [facility]- copay amounts may vary at other pharmacies due to pharmacy/plan contracts, or as the patient moves through the different stages of their insurance plan.   PA #/Case ID/Reference #:  [id number]

## 2023-07-26 NOTE — Telephone Encounter (Signed)
Pharmacy Patient Advocate Encounter   Received notification from Pt Calls Messages that prior authorization for Ozempic (0.25 or 0.5 MG/DOSE) 2MG /3ML pen-injectors is required/requested.   Insurance verification completed.   The patient is insured through [facility] .   Per test claim: PA required; PA submitted to [facility] via CoverMyMeds Key/confirmation #/EOC [id number] Status is pending    PA resubmitted

## 2023-07-27 DIAGNOSIS — M25552 Pain in left hip: Secondary | ICD-10-CM | POA: Diagnosis not present

## 2023-07-30 DIAGNOSIS — S134XXA Sprain of ligaments of cervical spine, initial encounter: Secondary | ICD-10-CM | POA: Diagnosis not present

## 2023-07-30 DIAGNOSIS — M5451 Vertebrogenic low back pain: Secondary | ICD-10-CM | POA: Diagnosis not present

## 2023-08-02 DIAGNOSIS — M20011 Mallet finger of right finger(s): Secondary | ICD-10-CM | POA: Diagnosis not present

## 2023-08-09 DIAGNOSIS — G4733 Obstructive sleep apnea (adult) (pediatric): Secondary | ICD-10-CM | POA: Diagnosis not present

## 2023-08-14 ENCOUNTER — Telehealth: Payer: Self-pay | Admitting: Nurse Practitioner

## 2023-08-14 NOTE — Telephone Encounter (Signed)
Prescription Request  08/14/2023  LOV: 07/18/2023  What is the name of the medication or equipment? amLODipine (NORVASC) 5 MG tablet & metoprolol succinate (TOPROL-XL) 100 MG 24 hr tablet   Have you contacted your pharmacy to request a refill? No   Which pharmacy would you like this sent to?    [id number] [place], [place] - [address] AT [facility] [address] [place] [address] Phone: [phone] Fax: [phone]    Patient notified that their request is being sent to the clinical staff for review and that they should receive a response within 2 business days.   Please advise at Mobile [phone] (mobile)

## 2023-08-14 NOTE — Telephone Encounter (Signed)
Previously filled through [facility]. Pt states that both medications have been discussed in office. Pt states he is completely out of both. Marland Kitchen   LAST APPOINTMENT DATE: 07/18/2023   NEXT APPOINTMENT DATE: Visit date not found  Amlodipine 5 mg  LAST REFILL: 09/27/2022  QTY: unknown, 0RF   TOPROL-XL 100 mg LAST REFILL: 11/21/2013  QTY: #30, 0RF

## 2023-08-15 MED ORDER — METOPROLOL SUCCINATE ER 100 MG PO TB24: 100.0000 mg | ORAL_TABLET | Freq: Every day | ORAL | 1 refills | Status: DC

## 2023-08-15 MED ORDER — AMLODIPINE BESYLATE 5 MG PO TABS: 5.0000 mg | ORAL_TABLET | Freq: Every day | ORAL | 1 refills | Status: DC

## 2023-08-15 NOTE — Addendum Note (Signed)
Addended by: Eden Emms on: 08/15/2023 01:29 PM   Modules accepted: Orders

## 2023-08-15 NOTE — Telephone Encounter (Signed)
Refill provided

## 2023-08-15 NOTE — Telephone Encounter (Signed)
Contacted pt to inform him of the refills sent in. Pt verbalized understanding. No questions or concerns.

## 2023-08-16 ENCOUNTER — Ambulatory Visit: Payer: Medicaid Other | Admitting: Nurse Practitioner

## 2023-08-20 ENCOUNTER — Telehealth: Payer: Self-pay | Admitting: Nurse Practitioner

## 2023-08-20 NOTE — Telephone Encounter (Signed)
Noted. If we can try and get in touch with him that would be good

## 2023-08-20 NOTE — Telephone Encounter (Signed)
FYI: This call has been transferred to Access Nurse. Once the result note has been entered staff can address the message at that time.  Patient called in with the following symptoms:  Red Word:elevated blood pressure Patient called in to reschedule appt from last week. He stated that his BP is still reading high. Today it is reading 160/100 and he is experiencing some dizziness, blurred vision, fatigue, and nausea.   Please advise at Mobile [phone] (mobile)  Message is routed to Provider Pool and [facility] Triage

## 2023-08-20 NOTE — Telephone Encounter (Signed)
Unable to reach pt by phone. V/m has not been set up.will try call again later. Sending note to lsc triage and Mordecai Maes NP and [place] pool.

## 2023-08-20 NOTE — Telephone Encounter (Addendum)
Unable to reach pt by phone and still no v/m set up. Will try again later.(FYI - pt has appt already scheduled with Bryan Nine NP on 08/21/23 at 11 AM.sending to lsc triage.
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
## 2023-08-21 ENCOUNTER — Ambulatory Visit: Payer: Medicaid Other | Admitting: Nurse Practitioner

## 2023-08-21 VITALS — BP 136/82 | HR 80 | Temp 97.8°F | Ht 66.05 in | Wt 273.6 lb

## 2023-08-21 DIAGNOSIS — Z794 Long term (current) use of insulin: Secondary | ICD-10-CM | POA: Diagnosis not present

## 2023-08-21 DIAGNOSIS — R42 Dizziness and giddiness: Secondary | ICD-10-CM | POA: Diagnosis not present

## 2023-08-21 DIAGNOSIS — R110 Nausea: Secondary | ICD-10-CM | POA: Diagnosis not present

## 2023-08-21 DIAGNOSIS — I10 Essential (primary) hypertension: Secondary | ICD-10-CM

## 2023-08-21 DIAGNOSIS — E1165 Type 2 diabetes mellitus with hyperglycemia: Secondary | ICD-10-CM | POA: Diagnosis not present

## 2023-08-21 MED ORDER — BLOOD GLUCOSE TEST VI STRP
1.0000 | ORAL_STRIP | Freq: Every day | 0 refills | Status: DC
Start: 2023-08-21 — End: 2024-02-18

## 2023-08-21 MED ORDER — LANCETS MISC. MISC
1.0000 | Freq: Every day | 0 refills | Status: DC
Start: 2023-08-21 — End: 2024-02-18

## 2023-08-21 MED ORDER — LANCET DEVICE MISC
1.0000 | Freq: Three times a day (TID) | 0 refills | Status: AC
Start: 2023-08-21 — End: 2023-09-20

## 2023-08-21 MED ORDER — BLOOD GLUCOSE MONITORING SUPPL DEVI
1.0000 | Freq: Three times a day (TID) | 0 refills | Status: DC
Start: 2023-08-21 — End: 2024-02-18

## 2023-08-21 NOTE — Assessment & Plan Note (Signed)
Could be multifactorial could be because of GLP-1 receptor agonist.  Offered to send an antiemetic patient politely declined

## 2023-08-21 NOTE — Patient Instructions (Signed)
Nice to see you today I want to see you in 2 months for the sugar recheck, sooner If you need me
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
## 2023-08-21 NOTE — Assessment & Plan Note (Signed)
Patient currently maintained on lisinopril, metoprolol, amlodipine.  He will take spironolactone for diuretic as needed blood pressure within normal limits here.  Do question if patient's blood pressure cuff is accurate as he uses a wrist cuff.

## 2023-08-21 NOTE — Assessment & Plan Note (Signed)
Currently maintained on Ozempic 0.25 mg once weekly.  Send in glucometer and supplies to check glucose daily continue medication as prescribed

## 2023-08-21 NOTE — Telephone Encounter (Signed)
noted 

## 2023-08-21 NOTE — Assessment & Plan Note (Signed)
Sounds orthostatic in nature.  He was not orthostatic today in office encouraged him to drink electrolyte drink on days he does feel that way and to change positions slowly.  Continue antihypertensives as prescribed

## 2023-08-30 ENCOUNTER — Telehealth: Payer: Self-pay | Admitting: Nurse Practitioner

## 2023-08-30 MED ORDER — DEXLANSOPRAZOLE 60 MG PO CPDR: 60.0000 mg | DELAYED_RELEASE_CAPSULE | Freq: Every day | ORAL | 1 refills | Status: DC

## 2023-08-30 NOTE — Telephone Encounter (Signed)
Prescription Request  08/30/2023  LOV: 08/21/2023  What is the name of the medication or equipment? dexlansoprazole (DEXILANT) 60 MG capsule   Have you contacted your pharmacy to request a refill? No   Which pharmacy would you like this sent to?   [id number] [place], [place] - [address] AT [facility] [address] [place] [address] Phone: [phone] Fax: [phone]    Patient notified that their request is being sent to the clinical staff for review and that they should receive a response within 2 business days.   Please advise at Mobile [phone] (mobile)

## 2023-09-17 ENCOUNTER — Telehealth: Payer: Self-pay | Admitting: Nurse Practitioner

## 2023-09-17 DIAGNOSIS — E1165 Type 2 diabetes mellitus with hyperglycemia: Secondary | ICD-10-CM

## 2023-09-17 MED ORDER — QUETIAPINE FUMARATE 50 MG PO TABS: 50.0000 mg | ORAL_TABLET | Freq: Every day | ORAL | 1 refills | Status: DC

## 2023-09-17 MED ORDER — OZEMPIC (0.25 OR 0.5 MG/DOSE) 2 MG/3ML ~~LOC~~ SOPN: 0.5000 mg | PEN_INJECTOR | SUBCUTANEOUS | 1 refills | Status: DC

## 2023-09-17 NOTE — Telephone Encounter (Signed)
Refills sent to pharmacy requested. 

## 2023-09-17 NOTE — Telephone Encounter (Signed)
LAST APPOINTMENT DATE: 08/30/2023   NEXT APPOINTMENT DATE: Visit date not found   Seroquel 25mg  (pt states that this script should be 50mg ?)   LAST REFILL: 08/29/2022  QTY: #14 0RF    Ozempic 0.25 or 0.5   LAST REFILL: 07/02/2023  QTY: #74mL 0RF

## 2023-09-17 NOTE — Telephone Encounter (Signed)
Prescription Request  09/17/2023  LOV: 08/21/2023  What is the name of the medication or equipment? QUEtiapine (SEROQUEL) 25 MG tablet, should be 50 MG  Semaglutide,0.25 or 0.5MG /DOS, (OZEMPIC, 0.25 OR 0.5 MG/DOSE,) 2 MG/3ML SOPN   Have you contacted your pharmacy to request a refill? No   Which pharmacy would you like this sent to?  [id number] [place], [place] - [address] AT [facility] [address] [place] [address] Phone: [phone] Fax: [phone]    Patient notified that their request is being sent to the clinical staff for review and that they should receive a response within 2 business days.   Please advise at Mobile [phone] (mobile)

## 2023-10-02 ENCOUNTER — Ambulatory Visit: Payer: Self-pay | Admitting: Nurse Practitioner

## 2023-10-02 NOTE — Telephone Encounter (Signed)
Okay---I will see her on Thursday if she doesn't need more urgent evaluation

## 2023-10-02 NOTE — Telephone Encounter (Signed)
I spoke with pt;pt said starting on 09/30/23 with SOB upon exertion, heaviness in chest; pt can not walk more than 4 - 5 feet without giving out of breath and sweating profusely. Pt denies CP or fast heart beat;also on 09/30/23 pt having cramps in hands and legs. Pt is not coughing., advised pt no available appts at [facility] today and pt needs to go to ED for eval and testing. Pt said he is not spending Christmas Eve in [facility] or ED. Pt said he wants appt at office on [place] or Fri. I advised pt he should not wait that long to be seen. Pt scheduled appt with Dr Alphonsus Sias 10/04/23 at 2:30 (pt wanted afternoon appt) with UC & ED precautions given again. Pt voiced understanding and said he cannot walk across parking lot at ED. I advised EMS could take pt to ED. Pt said if he worsened he would call 911. Sending to [name] NP, Dr Alphonsus Sias and [place] pool. Will teams Janae also.

## 2023-10-02 NOTE — Telephone Encounter (Signed)
Copied from CRM [id number]. Topic: Clinical - Pink Word Triage >> Oct 02, 2023 10:48 AM Tiffany H wrote: Reason for Triage: Patient called to advise that his shortness of breath has gotten very weak lately. Patient advised that he's stopped smoking and has switched to vaping. He's having persistent shortness of breath. He's been sweating and weak. He nearly turned on the [facility]. He can't walk more than a few feet without getting winded.   Chief Complaint: SOB with exertion Symptoms: SOB with any standing/activity, dizziness, sweating, heart racing, trouble sleeping, "clear to yellow" phlegm, occasional cough Frequency: Continual, worsening, intermittent Pertinent Negatives: Patient denies chest pain, abnormal breath sounds Disposition: [x] ED /[] Urgent Care (no appt availability in office) / [] Appointment(In office/virtual)/ []  [place] [facility]/ [] Home Care/ [x] Refused Recommended Disposition /[] [place] [facility]/ [x]  Follow-up with PCP Additional Notes: Pt reporting SOB, when he gets up he feels "like someone sitting on me and can't breathe." Pt reporting that even "yesterday had really hard time walking 5 feet to turn tv on," as an example of what he experiences upon exertion. Pt reporting that breathing problem started on Sunday evening. Pt reporting that the SOB is intermittent and occurs only upon any exertion. Pt reporting that when he "get up, go into immediate stress, he "can feel heart racing when get real out of breath," body sweats profusely, then when "sit down and catch breath, feel fine, breathing a little bit harder than usual but goes away in 5 min," reporting that it is scary and "aggravating." Pt confirms no abnormal breathing sounds. Pt currently speaking in full clear and coherent sentences at present since sitting down. Pt reporting that he is unable to participate in his normal activities and that even walking 5 feet is difficult. Pt reporting that yesterday he was "soaking wet  all day long, so hot almost turned AC on" with "sweating" from the labor to exert himself, even though "not very warm outside yesterday." Pt reporting that he has a runny nose "a little bit, blowing nose couple times a day, mainly in morning," and also in morning "cough a big glob of stuff up a couple times," with today being the third day. Pt reporting phlegm is "clear to yellow sorta, not all yellow/green-like but not completely clear," especially when he first gets up in morning. Pt reporting he has felt "dizziness when having real bad SOB, and when get out of bed, weak in legs and dizzy." Pt also reporting body aches and "can feel heart racing when get real out of breath." Pt is having to clear his throat on phone call, reporting that he's "not coughing much, a little hoarse, got so much stuff I need be doing but can't do it, worrying me." Pt reporting that he is also "not sleeping the best, been up all night," reporting hx of trouble sleeping "for years," but had recently been improved "when got off percocet." Pt reporting new med for losing weight as of 6-8 weeks ago.Pt reporting that he last had difficulty breathing in June 2024 and also a couple months ago. Pt reporting that "at the end of smoking, [breathing] got a bit like this but not like it's been the past few days." Pt reporting that his breathing "had been better since quit smoking," but "breathing's been like this a while," had cardio work up with several tests, cardio "told me my heart was fine" but pt concerned "something's causing all this breathing problem." Pt reporting that he has an albuterol inhaler and  a fluticasone inhaler but "didn't feel a difference with that, went from better to way worse." Pt reporting he used his albuterol inhaler twice yesterday, but "don't think" it helped. Pt denies cardiac hx. Pt reporting hx of smoking and vaping. Pt reporting that when he gets up "go into immediate stress" feels like "when used to have panic  attacks," been thinking about that possibility for couple days, feels "like someone sitting on me and can't breathe." Pt reporting that he's "been depressed lately, bunch of stuff going on, lot on my mind," pt wondering if could be anxiety thing, but states "to me seems more like something giving me a fever." Pt reporting he does not have a thermometer to check his temp, but reporting sweating and body aches. Advised pt be examined in ED due to severity of SOB with other symptoms upon exertion, though pt confirms doing okay right now. Pt refusing ED on Christmas Eve. Advised pt be examined today, offered to look to schedule appt today, pt refusing. Pt reporting that he "will call 911 if really bad," but "want to make an appt" for "Thursday or Friday if open." Connected pt to CAL to discuss with pt, pt speaking with CAL now.  Reason for Disposition  [1] MODERATE difficulty breathing (e.g., speaks in phrases, SOB even at rest, pulse 100-120) AND [2] NEW-onset or WORSE than normal  Answer Assessment - Initial Assessment Questions 1. RESPIRATORY STATUS: "Describe your breathing?" (e.g., wheezing, shortness of breath, unable to speak, severe coughing)     Pt reporting that when he gets up he feels "like someone sitting on me and can't breathe." Pt reporting that even "yesterday had really hard time walking 5 feet to turn tv on," as an example of what he experiences upon exertion.  2. ONSET: "When did this breathing problem begin?"      Pt reporting that breathing problem started on Sunday evening.  3. PATTERN "Does the difficult breathing come and go, or has it been constant since it started?"      Pt reporting that the SOB is intermittent and occurs only upon any exertion. Pt reporting that when he "get up, go into immediate stress, he "can feel heart racing when get real out of breath," body sweats profusely, then when "sit down and catch breath, feel fine, breathing a little bit harder than usual but goes  away in 5 min," reporting that it is scary and "aggravating."  4. SEVERITY: "How bad is your breathing?" (e.g., mild, moderate, severe)    - MILD: No SOB at rest, mild SOB with walking, speaks normally in sentences, can lie down, no retractions, pulse < 100.    - MODERATE: SOB at rest, SOB with minimal exertion and prefers to sit, cannot lie down flat, speaks in phrases, mild retractions, audible wheezing, pulse 100-120.    - SEVERE: Very SOB at rest, speaks in single words, struggling to breathe, sitting hunched forward, retractions, pulse > 120      Pt confirms no abnormal breathing sounds. Pt currently speaking in full clear and coherent sentences at present since sitting down. Pt reporting that he is unable to participate in his normal activities and that even walking 5 feet is difficult.  5. RECURRENT SYMPTOM: "Have you had difficulty breathing before?" If Yes, ask: "When was the last time?" and "What happened that time?"      Pt reporting that he last had difficulty breathing in June 2024 and also a couple months ago. Pt reporting that "  at the end of smoking, [breathing] got a bit like this but not like it's been the past few days." Pt reporting that his breathing "had been better since quit smoking," but "breathing's been like this a while," had cardio work up with several tests, cardio "told me my heart was fine" but pt concerned "something's causing all this breathing problem." Pt reporting that he has an albuterol inhaler and a fluticasone inhaler but "didn't feel a difference with that, went from better to way worse." Pt reporting he used his albuterol inhaler twice yesterday, but "don't think" it helped.  6. CARDIAC HISTORY: "Do you have any history of heart disease?" (e.g., heart attack, angina, bypass surgery, angioplasty)      Pt denies cardiac hx.  7. LUNG HISTORY: "Do you have any history of lung disease?"  (e.g., pulmonary embolus, asthma, emphysema)     Pt reporting hx of smoking  and vaping.  8. CAUSE: "What do you think is causing the breathing problem?"      Pt reporting that when he gets up "go into immediate stress" feels like "when used to have panic attacks," been thinking about that possibility for couple days, feels "like someone sitting on me and can't breathe." Pt reporting that he's "been depressed lately, bunch of stuff going on, lot on my mind," pt wondering if could be anxiety thing, but states "to me seems more like something giving me a fever." Pt reporting he does not have a thermometer to check his temp, but reporting sweating and body aches.  9. OTHER SYMPTOMS: "Do you have any other symptoms? (e.g., dizziness, runny nose, cough, chest pain, fever)     Pt reporting that yesterday he was "soaking wet all day long, so hot almost turned AC on" with "sweating" from the labor to exert himself, even though "not very warm outside yesterday." Pt reporting that he has a runny nose "a little bit, blowing nose couple times a day, mainly in morning," and also in morning "cough a big glob of stuff up a couple times," with today being the third day. Pt reporting phlegm is "clear to yellow sorta, not all yellow/green-like but not completely clear," especially when he first gets up in morning. Pt reporting he has felt "dizziness when having real bad SOB, and when get out of bed, weak in legs and dizzy." Pt also reporting body aches and "can feel heart racing when get real out of breath." Pt is having to clear his throat on phone call, reporting that he's "not coughing much, a little hoarse, got so much stuff I need be doing but can't do it, worrying me." Pt reporting that he is also "not sleeping the best, been up all night," reporting hx of trouble sleeping "for years," but had recently been improved "when got off percocet." Pt reporting new med for losing weight as of 6-8 weeks ago.  Protocols used: Breathing Difficulty-A-AH

## 2023-10-04 ENCOUNTER — Ambulatory Visit: Payer: Medicaid Other | Admitting: Internal Medicine

## 2023-10-15 ENCOUNTER — Ambulatory Visit: Payer: Medicaid Other | Admitting: Nurse Practitioner

## 2023-10-17 ENCOUNTER — Ambulatory Visit: Payer: Medicaid Other | Admitting: Nurse Practitioner

## 2023-10-19 ENCOUNTER — Ambulatory Visit: Payer: Medicaid Other | Admitting: Nurse Practitioner

## 2023-10-22 ENCOUNTER — Encounter: Payer: Self-pay | Admitting: Nurse Practitioner

## 2023-12-06 ENCOUNTER — Other Ambulatory Visit: Payer: Self-pay | Admitting: Nurse Practitioner

## 2023-12-06 NOTE — Telephone Encounter (Unsigned)
 Copied from CRM [id number]. Topic: Clinical - Medication Refill >> Dec 06, 2023 12:37 PM Sim Boast F wrote: Most Recent Primary Care Visit:  Provider: Eden Emms  Department: [facility]  Visit Type: OFFICE VISIT  Date: 08/21/2023  Medication: metoprolol, amLODipine, dexlansoprazole  Has the patient contacted their pharmacy? Yes (Agent: If no, request that the patient contact the pharmacy for the refill. If patient does not wish to contact the pharmacy document the reason why and proceed with request.) (Agent: If yes, when and what did the pharmacy advise?)  Is this the correct pharmacy for this prescription? Yes If no, delete pharmacy and type the correct one.  This is the patient's preferred pharmacy:   [id number] [place], [place] - [address] AT [facility] [address] [place] [address] Phone: [phone] Fax: [phone]   Has the prescription been filled recently? Yes  Is the patient out of the medication? Yes  Has the patient been seen for an appointment in the last year OR does the patient have an upcoming appointment? Yes  Can we respond through MyChart? No  Agent: Please be advised that Rx refills may take up to 3 business days. We ask that you follow-up with your pharmacy.

## 2023-12-07 MED ORDER — METOPROLOL SUCCINATE ER 100 MG PO TB24: 100.0000 mg | ORAL_TABLET | Freq: Every day | ORAL | 1 refills | Status: DC

## 2023-12-07 MED ORDER — AMLODIPINE BESYLATE 5 MG PO TABS: 5.0000 mg | ORAL_TABLET | Freq: Every day | ORAL | 1 refills | Status: DC

## 2023-12-07 MED ORDER — DEXLANSOPRAZOLE 60 MG PO CPDR: 60.0000 mg | DELAYED_RELEASE_CAPSULE | Freq: Every day | ORAL | 1 refills | Status: DC

## 2024-01-08 ENCOUNTER — Telehealth: Payer: Self-pay | Admitting: Nurse Practitioner

## 2024-01-08 NOTE — Telephone Encounter (Signed)
 Patient refusing to speak with NT for symptoms. Forwarding to CAL as HP for follow-up.   Copied from CRM [id number]. Topic: Clinical - Red Word Triage >> Jan 08, 2024  9:32 AM Bryan Huffman wrote: Red Word that prompted transfer to Nurse Triage: Breathing is worse, and continuing to get worse and worse. Can barely get around and out of breath going a few feet, no energy and very weak. Heart is pounding. Says he is depressed and drinking more because of this. Does not want to go to the ER, wants to see his doctor. He is refusing to speak with triage and just wants me to schedule an appointment. Says he has already spoken to the nurses a few times. Please follow up.

## 2024-01-08 NOTE — Telephone Encounter (Signed)
 I spoke with pt;pt said having problems breathing for about 1 year;last 30 days breathing has worsened. Pt can only walk few feet and then gets SOB. Pt said he is very weak. Pt is depressed due to being told there is nothing wrong but he still can not breath after moving around room. No SI/HI per pt. No CP,H/A or dizziness. No abd or extremity swelling. Pt last seen 08/21/23.pt refuses to go to [facility] or ED. Offered pt appt on 01/09/24 but pt has something else he has to do so pt declined appt on 01/09/24. Pt cannot take BP at home. Pt scheduled appt with [name] NP 01/10/24 at 11 AM with UC & ED precautions and pt voiced understanding.sending note to [name] NP and [place] pool I have spoken with [facility] CMA.

## 2024-01-08 NOTE — Telephone Encounter (Signed)
 Noted. Will eval in office

## 2024-01-10 ENCOUNTER — Ambulatory Visit: Admitting: Nurse Practitioner

## 2024-01-11 ENCOUNTER — Ambulatory Visit: Admitting: Nurse Practitioner

## 2024-01-11 ENCOUNTER — Ambulatory Visit (INDEPENDENT_AMBULATORY_CARE_PROVIDER_SITE_OTHER)
Admission: RE | Admit: 2024-01-11 | Discharge: 2024-01-11 | Disposition: A | Discharge status: Home / Self Care | Source: Ambulatory Visit | Attending: Nurse Practitioner | Admitting: Nurse Practitioner

## 2024-01-11 ENCOUNTER — Ambulatory Visit: Admitting: Family Medicine

## 2024-01-11 ENCOUNTER — Encounter: Payer: Self-pay | Admitting: Nurse Practitioner

## 2024-01-11 ENCOUNTER — Telehealth: Payer: Self-pay

## 2024-01-11 VITALS — BP 138/82 | HR 66 | Temp 97.4°F | Ht 66.05 in | Wt 251.0 lb

## 2024-01-11 DIAGNOSIS — R058 Other specified cough: Secondary | ICD-10-CM | POA: Diagnosis not present

## 2024-01-11 DIAGNOSIS — R0602 Shortness of breath: Secondary | ICD-10-CM

## 2024-01-11 DIAGNOSIS — Z794 Long term (current) use of insulin: Secondary | ICD-10-CM

## 2024-01-11 DIAGNOSIS — F1010 Alcohol abuse, uncomplicated: Secondary | ICD-10-CM

## 2024-01-11 DIAGNOSIS — F419 Anxiety disorder, unspecified: Secondary | ICD-10-CM | POA: Diagnosis not present

## 2024-01-11 DIAGNOSIS — F332 Major depressive disorder, recurrent severe without psychotic features: Secondary | ICD-10-CM | POA: Diagnosis not present

## 2024-01-11 DIAGNOSIS — E1165 Type 2 diabetes mellitus with hyperglycemia: Secondary | ICD-10-CM

## 2024-01-11 LAB — COMPREHENSIVE METABOLIC PANEL WITH GFR
ALT: 37 U/L (ref 0–53)
AST: 49 U/L — ABNORMAL HIGH (ref 0–37)
Albumin: 4.6 g/dL (ref 3.5–5.2)
Alkaline Phosphatase: 92 U/L (ref 39–117)
BUN: 3 mg/dL — ABNORMAL LOW (ref 6–23)
CO2: 23 meq/L (ref 19–32)
Calcium: 9.6 mg/dL (ref 8.4–10.5)
Chloride: 93 meq/L — ABNORMAL LOW (ref 96–112)
Creatinine, Ser: 0.67 mg/dL (ref 0.40–1.50)
GFR: 105.63 mL/min (ref >60.00)
Glucose, Bld: 126 mg/dL — ABNORMAL HIGH (ref 70–99)
Potassium: 3.4 meq/L — ABNORMAL LOW (ref 3.5–5.1)
Sodium: 135 meq/L (ref 135–145)
Total Bilirubin: 0.5 mg/dL (ref 0.2–1.2)
Total Protein: 7.8 g/dL (ref 6.0–8.3)

## 2024-01-11 LAB — CBC
HCT: 43.3 % (ref 39.0–52.0)
Hemoglobin: 14.2 g/dL (ref 13.0–17.0)
MCHC: 32.7 g/dL (ref 30.0–36.0)
MCV: 86.8 fl (ref 78.0–100.0)
Platelets: 285 10*3/uL (ref 150.0–400.0)
RBC: 4.99 Mil/uL (ref 4.22–5.81)
RDW: 18.3 % — ABNORMAL HIGH (ref 11.5–15.5)
WBC: 6.7 10*3/uL (ref 4.0–10.5)

## 2024-01-11 LAB — HEMOGLOBIN A1C: Hgb A1c MFr Bld: 6.9 % — ABNORMAL HIGH (ref 4.6–6.5)

## 2024-01-11 LAB — BRAIN NATRIURETIC PEPTIDE: Pro B Natriuretic peptide (BNP): 180 pg/mL — ABNORMAL HIGH (ref 0.0–100.0)

## 2024-01-11 MED ORDER — BUSPIRONE HCL 10 MG PO TABS
10.0000 mg | ORAL_TABLET | Freq: Two times a day (BID) | ORAL | 2 refills | Status: DC
Start: 2024-01-11 — End: 2024-02-04

## 2024-01-11 MED ORDER — BUDESONIDE-FORMOTEROL FUMARATE 160-4.5 MCG/ACT IN AERO
2.0000 | INHALATION_SPRAY | Freq: Two times a day (BID) | RESPIRATORY_TRACT | 3 refills | Status: AC
Start: 2024-01-11 — End: ?

## 2024-01-11 MED ORDER — ALBUTEROL SULFATE HFA 108 (90 BASE) MCG/ACT IN AERS: 2.0000 | INHALATION_SPRAY | Freq: Four times a day (QID) | RESPIRATORY_TRACT | 0 refills | Status: DC | PRN

## 2024-01-11 MED ORDER — METFORMIN HCL ER 500 MG PO TB24
500.0000 mg | ORAL_TABLET | Freq: Two times a day (BID) | ORAL | 2 refills | Status: DC
Start: 2024-01-11 — End: 2024-04-09

## 2024-01-11 NOTE — Assessment & Plan Note (Addendum)
 Discussed treatment options and pt expressed desire to go back on Metformin and does not want to try another GLP1 at this time. Will recheck A1C today. Start Metformin ER 500 mg PO BID.   I evaluated patient, was consulted regarding treatment, and agree with assessment and plan per Denice Bors RN, FNP Student   [name], DNP, AGNP-C

## 2024-01-11 NOTE — Assessment & Plan Note (Addendum)
 Ambulatory referral to social work to help facilitate admission to alcohol rehab program. Pt cautioned on stopping abruptly given he reports withdrawal symptoms when he wakes in the AM.  I evaluated patient, was consulted regarding treatment, and agree with assessment and plan per Denice Bors RN, FNP Student   [name], DNP, AGNP-C

## 2024-01-11 NOTE — Progress Notes (Signed)
 Acute Office Visit  Subjective:     Patient ID: Bryan Huffman, male    DOB: 1971/09/11, 53 y.o.   MRN: [id number]  Chief Complaint  Patient presents with   Shortness of Breath    Pt complains of extreme SOB. Pt states that he is unable to get anything done. Ongoing for months. Pt states pollen is making symptoms worse. Pt has started smoking cigarettes.    Medication Management    Pt complains of depression increasing as well as alcohol use. States that Seroquel is not working anymore. Pt complains that Ozempic gives him too many side effects and would like an alternative to start losing more weight.     HPI Patient is in today for [facility] with a hx of DM2, GERD, HTN,  SHOB:Patient states the shortness of breath started around Christmas and is getting worse gradually.  He reports that he started smoking again around that time which has increased shortness of breath.  He does endorse a productive cough with clear thick sputum.  He had been prescribed Breo in the past but has stopped using it because it was ineffective.  The dyspnea is limiting his ability to work.  He claims that he cannot walk down his driveway to the mailbox without getting breathless.  Depression: hx of the same that he was taking seroquel for and sleep. He mentions that is not helping any more.Patient states he reports being depressed because of his health and being alone.  States that just him and his.  Talks to family approximately once a week.  He does feel that his health limits him from going out on occasion.  He has done talk therapy in the past but states it did not help.  He is also seeing psychiatry in the past who told him his bipolar he had tried Cymbalta and bipolar medication and it made it worse.  He started drinking and smoking to help cope with his emotions currently.   DM2: hx of the same that patinet was on ozempic 0.25mg  weekly then titrated up to 0.5mg  weekly. He stopped using the medication as he was  experiencing several side effects.  Side effects the patient experienced for nausea, vomiting, and constipation.  Patient does mention that he has been on metformin in the past and had good glucose control along with added benefit of weight loss.  Review of Systems  Constitutional:  Negative for chills and fever.  Respiratory:  Positive for shortness of breath.   Cardiovascular:  Negative for chest pain.  Neurological:  Negative for headaches.  Psychiatric/Behavioral:  Negative for hallucinations and suicidal ideas. The patient is nervous/anxious and has insomnia.         Objective:    BP 138/82   Pulse 66   Temp (!) 97.4 F (36.3 C) (Oral)   Ht 5' 6.05" (1.678 m)   Wt 251 lb (113.9 kg)   SpO2 93%   BMI 40.45 kg/m  BP Readings from Last 3 Encounters:  01/11/24 138/82  08/21/23 136/82  07/18/23 (!) 136/90   Wt Readings from Last 3 Encounters:  01/11/24 251 lb (113.9 kg)  08/21/23 273 lb 9.6 oz (124.1 kg)  07/18/23 275 lb 9.6 oz (125 kg)      Physical Exam Vitals and nursing note reviewed.  Constitutional:      Appearance: Normal appearance.  Cardiovascular:     Rate and Rhythm: Normal rate and regular rhythm.     Heart sounds: Normal heart sounds.  Pulmonary:     Effort: Pulmonary effort is normal.     Breath sounds: Wheezing and rhonchi present.  Neurological:     Mental Status: He is alert.     No results found for any visits on 01/11/24.      Assessment & Plan:   Problem List Items Addressed This Visit       Endocrine   Type 2 diabetes mellitus with hyperglycemia, with long-term current use of insulin (HCC)   Discussed treatment options and pt expressed desire to go back on Metformin and does not want to try another GLP1 at this time. Will recheck A1C today. Start Metformin ER 500 mg PO BID.   I evaluated patient, was consulted regarding treatment, and agree with assessment and plan per Denice Bors RN, FNP Student   [name], DNP, AGNP-C        Relevant Medications   metFORMIN (GLUCOPHAGE-XR) 500 MG 24 hr tablet   Other Relevant Orders   Hemoglobin A1c     Other   MDD (major depressive disorder)   Multifactorial from social, health, and financial stressors. Discussed psychology and psychiatry options and he did not want to pursue that at this time. Expressed desire to go to rehab for alcohol dependence, which he believes will help with the depression and I agree. Ambulatory referral to social work made to help facilitate process. Start Buspar 10 mg BID to help with depression and anxiety.   I evaluated patient, was consulted regarding treatment, and agree with assessment and plan per Denice Bors RN, FNP Student   [name], DNP, AGNP-C       Relevant Medications   busPIRone (BUSPAR) 10 MG tablet   Shortness of breath - Primary   Recurrent and worsening episodes of ShOB that is chronic in nature. Will perform CXR to rule out infectious or other infiltrative etiology. Educated pt on medication adherence and smoking cessation. He has tried [employer] inhaler in the past, but stopped it because he did not feel like it helped. Will start Symbicort 160-4.5 mcg/act, 2 puffs BID as maintenance and albuterol 108 mcg/act, 2 puffs PRN for wheezing and ShOB. Will consider pulmonary referral on follow up visit. Will check CBC, CMP, BNP labs.   I evaluated patient, was consulted regarding treatment, and agree with assessment and plan per Denice Bors RN, FNP Student   [name], DNP, AGNP-C       Relevant Medications   albuterol (VENTOLIN HFA) 108 (90 Base) MCG/ACT inhaler   budesonide-formoterol (SYMBICORT) 160-4.5 MCG/ACT inhaler   Other Relevant Orders   CBC   Comprehensive metabolic panel with GFR   Brain natriuretic peptide   DG Chest 2 View   Morbid obesity (HCC)   Discussed options for GLP-1, but given that he did not do well on semaglutide he did not want to try another at this time. Educated patient on lifestyle modifications.   I  evaluated patient, was consulted regarding treatment, and agree with assessment and plan per Denice Bors RN, FNP Student   [name], DNP, AGNP-C       Relevant Medications   metFORMIN (GLUCOPHAGE-XR) 500 MG 24 hr tablet   Other Relevant Orders   Hemoglobin A1c   Anxiety   Start Buspar 10 mg BID. Reduce ETOH intake by gradually tapering.  I evaluated patient, was consulted regarding treatment, and agree with assessment and plan per Denice Bors RN, FNP Student   [name], DNP, AGNP-C       Relevant  Medications   busPIRone (BUSPAR) 10 MG tablet   Alcohol abuse   Ambulatory referral to social work to help facilitate admission to alcohol rehab program. Pt cautioned on stopping abruptly given he reports withdrawal symptoms when he wakes in the AM.  I evaluated patient, was consulted regarding treatment, and agree with assessment and plan per Denice Bors RN, FNP Student   [name], DNP, AGNP-C       Relevant Orders   AMB Referral VBCI Care Management    Meds ordered this encounter  Medications   albuterol (VENTOLIN HFA) 108 (90 Base) MCG/ACT inhaler    Sig: Inhale 2 puffs into the lungs every 6 (six) hours as needed for wheezing or shortness of breath.    Dispense:  8 g    Refill:  0   budesonide-formoterol (SYMBICORT) 160-4.5 MCG/ACT inhaler    Sig: Inhale 2 puffs into the lungs 2 (two) times daily.    Dispense:  1 each    Refill:  3   busPIRone (BUSPAR) 10 MG tablet    Sig: Take 1 tablet (10 mg total) by mouth 2 (two) times daily.    Dispense:  60 tablet    Refill:  2   metFORMIN (GLUCOPHAGE-XR) 500 MG 24 hr tablet    Sig: Take 1 tablet (500 mg total) by mouth 2 (two) times daily with a meal.    Dispense:  60 tablet    Refill:  2    Return in about 1 month (around 02/10/2024) for MDD, DM, SOB recheck.  [name], NP

## 2024-01-11 NOTE — Telephone Encounter (Signed)
 Note created and pt called. He will pick up Monday morning on his way to class.  Placed in fill box in front office.   Copied from CRM [id number]. Topic: General - Other >> Jan 11, 2024  2:23 PM Kathryne Eriksson wrote: Reason for CRM: Excuse >> Jan 11, 2024  2:27 PM Kathryne Eriksson wrote: Patient states he's needing an excuse for missing class, due to feeling ill and being seen in office. Patient states it is URGENT that he receives this on Monday morning, as he is to report to class at 10AM. Patient provided his best call back number so that he's aware when the document can be picked up. Patient number is [phone].

## 2024-01-11 NOTE — Assessment & Plan Note (Addendum)
 Recurrent and worsening episodes of ShOB that is chronic in nature. Will perform CXR to rule out infectious or other infiltrative etiology. Educated pt on medication adherence and smoking cessation. He has tried [employer] inhaler in the past, but stopped it because he did not feel like it helped. Will start Symbicort 160-4.5 mcg/act, 2 puffs BID as maintenance and albuterol 108 mcg/act, 2 puffs PRN for wheezing and ShOB. Will consider pulmonary referral on follow up visit. Will check CBC, CMP, BNP labs.   I evaluated patient, was consulted regarding treatment, and agree with assessment and plan per Denice Bors RN, FNP Student   [name], DNP, AGNP-C

## 2024-01-11 NOTE — Patient Instructions (Signed)
 It was a pleasure seeing you today.  Prescriptions were sent to your pharmacy.  Make sure to rinse your mouth out after using the Symbicort.  Follow up in 4-6 weeks, sooner if you need [place].  Social work will reach out to you.

## 2024-01-11 NOTE — Assessment & Plan Note (Addendum)
 Start Buspar 10 mg BID. Reduce ETOH intake by gradually tapering.  I evaluated patient, was consulted regarding treatment, and agree with assessment and plan per Denice Bors RN, FNP Student   [name], DNP, AGNP-C

## 2024-01-11 NOTE — Assessment & Plan Note (Addendum)
 Discussed options for GLP-1, but given that he did not do well on semaglutide he did not want to try another at this time. Educated patient on lifestyle modifications.   I evaluated patient, was consulted regarding treatment, and agree with assessment and plan per Denice Bors RN, FNP Student   [name], DNP, AGNP-C

## 2024-01-11 NOTE — Progress Notes (Signed)
 Acute Office Visit  Subjective:     Patient ID: Bryan Huffman, male    DOB: Dec 06, 1970, 53 y.o.   MRN: [id number]  Chief Complaint  Patient presents with   Shortness of Breath    Pt complains of extreme SOB. Pt states that he is unable to get anything done. Ongoing for months. Pt states pollen is making symptoms worse. Pt has started smoking cigarettes.    Medication Management    Pt complains of depression increasing as well as alcohol use. States that Seroquel is not working anymore. Pt complains that Ozempic gives him too many side effects and would like an alternative to start losing more weight.    HPI  Bryan Huffman is a 3 YOM with a hx of HTN, HLD, DM2, and smoker hx.   SHOB: He presents with ShOB that has been ongoing for months that started before the holidays and has gradually getting worse. He reports he started smoking again around Christmas time and the [facility] became worse. He does have a productive cough with clear thick sputum. He denies fever, chills, or chest pain. The dyspnea is limiting his ability to work and is unable to walk down his drive way to get the mail. Walking to the bathroom causes him to get breathless. He has not taken his Breo inhaler in months because he states it does not help.   DM2: He is unable to check his glucose at home. He was started on Ozempic in September and he took it for 3 weeks. He was reports having every side effect including N/V and constipation. He has tried metformin before and he has had success with it reporting he has had glucose control and weight loss.   Depression: He reports being depressed because of his health and being alone. He states it is just he and his cat. He only talks to his family maybe once a week. He feels sometimes his health limits him from going out. He has done talk therapy in the past but it did not help. He reports seeing psychiatry in the past who told him he was bi-polar. He tried Cymbalta and bi-polar medication and  it made him worse. He has started drinking and smoking to help cope. He is drinking 5 [employer]'s a day and reports the need to drink first thing in the AM. He recently received a DWI.   Review of Systems  Constitutional:  Negative for chills, fever and weight loss.  Respiratory:  Positive for cough, sputum production, shortness of breath and wheezing.   Cardiovascular:  Negative for chest pain and palpitations.  Gastrointestinal:  Negative for abdominal pain, nausea and vomiting.  Musculoskeletal:  Positive for myalgias.  Neurological:  Negative for dizziness and headaches.  Psychiatric/Behavioral:  Positive for depression and substance abuse. Negative for hallucinations and suicidal ideas.         Objective:    BP 138/82   Pulse 66   Temp (!) 97.4 F (36.3 C) (Oral)   Ht 5' 6.05" (1.678 m)   Wt 113.9 kg   SpO2 93%   BMI 40.45 kg/m    Physical Exam Constitutional:      Appearance: He is ill-appearing.  HENT:     Right Ear: Tympanic membrane, ear canal and external ear normal.     Left Ear: Tympanic membrane, ear canal and external ear normal.     Mouth/Throat:     Mouth: Mucous membranes are moist.     Pharynx: Oropharynx is  clear.  Eyes:     Pupils: Pupils are equal, round, and reactive to light.  Cardiovascular:     Rate and Rhythm: Normal rate and regular rhythm.     Pulses: Normal pulses.     Heart sounds: Normal heart sounds. No murmur heard.    No friction rub. No gallop.  Pulmonary:     Effort: Tachypnea and respiratory distress (dyspnea with exhertion) present.     Breath sounds: Examination of the right-upper field reveals rhonchi. Examination of the left-upper field reveals rhonchi. Examination of the right-middle field reveals rhonchi. Examination of the left-middle field reveals rhonchi. Examination of the right-lower field reveals rhonchi. Examination of the left-lower field reveals rhonchi. Rhonchi present.  Abdominal:     General: Bowel sounds are  normal.     Palpations: Abdomen is soft.  Musculoskeletal:     Cervical back: Neck supple.  Lymphadenopathy:     Cervical: No cervical adenopathy.  Skin:    General: Skin is warm and dry.     Coloration: Skin is not cyanotic.  Neurological:     Mental Status: He is alert.  Psychiatric:        Attention and Perception: He does not perceive auditory or visual hallucinations.        Mood and Affect: Mood is anxious and depressed. Affect is tearful.        Thought Content: Thought content does not include homicidal or suicidal ideation.     No results found for any visits on 01/11/24.      Assessment & Plan:   Problem List Items Addressed This Visit     MDD (major depressive disorder)   Multifactorial from social, health, and financial stressors. Discussed psychology and psychiatry options and he did not want to pursue that at this time. Expressed desire to go to rehab for alcohol dependence, which he believes will help with the depression and I agree. Ambulatory referral to social work made to help facilitate process. Start Buspar 10 mg BID to help with depression and anxiety.       Relevant Medications   busPIRone (BUSPAR) 10 MG tablet   Shortness of breath - Primary   Recurrent and worsening episodes of ShOB that is chronic in nature. Will perform CXR to rule out infectious or other infiltrative etiology. Educated pt on medication adherence and smoking cessation. He has tried [employer] inhaler in the past, but stopped it because he did not feel like it helped. Will start Symbicort 160-4.5 mcg/act, 2 puffs BID as maintenance and albuterol 108 mcg/act, 2 puffs PRN for wheezing and ShOB. Will consider pulmonary referral on follow up visit. Will check CBC, CMP, BNP labs.       Relevant Medications   albuterol (VENTOLIN HFA) 108 (90 Base) MCG/ACT inhaler   budesonide-formoterol (SYMBICORT) 160-4.5 MCG/ACT inhaler   Other Relevant Orders   CBC   Comprehensive metabolic panel with GFR    Brain natriuretic peptide   DG Chest 2 View   Morbid obesity (HCC)   Discussed options for GLP-1, but given that he did not do well on semaglutide he did not want to try another at this time. Educated patient on lifestyle modifications.       Relevant Medications   metFORMIN (GLUCOPHAGE-XR) 500 MG 24 hr tablet   Other Relevant Orders   Hemoglobin A1c   Type 2 diabetes mellitus with hyperglycemia, with long-term current use of insulin (HCC)   Discussed treatment options and pt expressed desire to go back  on Metformin and does not want to try another GLP1 at this time. Will recheck A1C today. Start Metformin ER 500 mg PO BID.       Relevant Medications   metFORMIN (GLUCOPHAGE-XR) 500 MG 24 hr tablet   Other Relevant Orders   Hemoglobin A1c   Anxiety   Start Buspar 10 mg BID. Reduce ETOH intake.       Relevant Medications   busPIRone (BUSPAR) 10 MG tablet   Alcohol abuse   Ambulatory referral to social work to help facilitate admission to alcohol rehab program. Pt cautioned on stopping abruptly given he reports withdrawal symptoms when he wakes in the AM.      Relevant Orders   AMB Referral VBCI Care Management    Meds ordered this encounter  Medications   albuterol (VENTOLIN HFA) 108 (90 Base) MCG/ACT inhaler    Sig: Inhale 2 puffs into the lungs every 6 (six) hours as needed for wheezing or shortness of breath.    Dispense:  8 g    Refill:  0   budesonide-formoterol (SYMBICORT) 160-4.5 MCG/ACT inhaler    Sig: Inhale 2 puffs into the lungs 2 (two) times daily.    Dispense:  1 each    Refill:  3   busPIRone (BUSPAR) 10 MG tablet    Sig: Take 1 tablet (10 mg total) by mouth 2 (two) times daily.    Dispense:  60 tablet    Refill:  2   metFORMIN (GLUCOPHAGE-XR) 500 MG 24 hr tablet    Sig: Take 1 tablet (500 mg total) by mouth 2 (two) times daily with a meal.    Dispense:  60 tablet    Refill:  2    Return in about 1 month (around 02/10/2024) for MDD, DM, SOB  recheck.  Murvin Donning, RN

## 2024-01-11 NOTE — Assessment & Plan Note (Addendum)
 Multifactorial from social, health, and financial stressors. Discussed psychology and psychiatry options and he did not want to pursue that at this time. Expressed desire to go to rehab for alcohol dependence, which he believes will help with the depression and I agree. Ambulatory referral to social work made to help facilitate process. Start Buspar 10 mg BID to help with depression and anxiety.   I evaluated patient, was consulted regarding treatment, and agree with assessment and plan per Denice Bors RN, FNP Student   [name], DNP, AGNP-C

## 2024-01-14 ENCOUNTER — Encounter: Payer: Self-pay | Admitting: Nurse Practitioner

## 2024-01-15 ENCOUNTER — Ambulatory Visit: Payer: Self-pay

## 2024-01-15 NOTE — Telephone Encounter (Signed)
 The anxiety medication will take 10-14 days to work. There are no medications that he will feel relief in the first dose  Was he able to get his inhaler?

## 2024-01-15 NOTE — Telephone Encounter (Signed)
 Chief Complaint: SOB Symptoms: anxiety Frequency: constant   Disposition: [x] ED /[] Urgent Care (no appt availability in office) / [] Appointment(In office/virtual)/ []  [facility]/ [] Home Care/ [] Refused Recommended Disposition /[] [facility]/ []  Follow-up with PCP Additional Notes: Pt calling with anxiety and heaviness on chest. During triage, pt could only speak one or two words without gasping for air. Pt has used inhalers and stated, "It's not helping." Pt stated the anxiety is causing the heaviness on chest feeling. Pt denies chest pain. Pt stated he started the Buspar on 4/4 but "it's not working and I need something now. Life has me down. I get $700/month and after a week or tow I'm out of money. I'm overweight, still smoke, I have no social life, my legs are weak. I just need help." Pt wanted PCP to know he hasn't had any alcohol since Friday. Due to breathing concerns, RN advised pt to go to ED. Pt refused. Attempted to notify [facility]-no answer. RN gave care advice and pt stated he will go to ED if it gets worse. Pt is asking for a different anxiety medication to be called in to get some relief. Please advice           Copied from CRM [id number]. Topic: Clinical - Red Word Triage >> Jan 15, 2024 10:22 AM Elizebeth Brooking wrote: [facility] that prompted transfer to Nurse Triage: Patient called in regarding anxiety attack, stating he felt like he has an elephant sitting on his chest, and his whole body is aching. Was prescribed busPIRone (BUSPAR) 10 MG tablet he looked it up online and stated it takes a month to start working needs something that is going to help him now. Reason for Disposition  [1] MODERATE difficulty breathing (e.g., speaks in phrases, SOB even at rest, pulse 100-120) AND [2] NEW-onset or WORSE than normal  Answer Assessment - Initial Assessment Questions 1. RESPIRATORY STATUS: "Describe your breathing?" (e.g., wheezing, shortness of breath, unable to speak,  severe coughing)      SOB, unable to speak 2. ONSET: "When did this breathing problem begin?"      Always  3. PATTERN "Does the difficult breathing come and go, or has it been constant since it started?"      Comes and goes  4. SEVERITY: "How bad is your breathing?" (e.g., mild, moderate, severe)    - MILD: No SOB at rest, mild SOB with walking, speaks normally in sentences, can lie down, no retractions, pulse < 100.    - MODERATE: SOB at rest, SOB with minimal exertion and prefers to sit, cannot lie down flat, speaks in phrases, mild retractions, audible wheezing, pulse 100-120.    - SEVERE: Very SOB at rest, speaks in single words, struggling to breathe, sitting hunched forward, retractions, pulse > 120      Moderate  5. RECURRENT SYMPTOM: "Have you had difficulty breathing before?" If Yes, ask: "When was the last time?" and "What happened that time?"      All the time  6. CARDIAC HISTORY: "Do you have any history of heart disease?" (e.g., heart attack, angina, bypass surgery, angioplasty)      Denies  7. LUNG HISTORY: "Do you have any history of lung disease?"  (e.g., pulmonary embolus, asthma, emphysema)     Denies 8. CAUSE: "What do you think is causing the breathing problem?"      Anxiety  9. OTHER SYMPTOMS: "Do you have any other symptoms? (e.g., dizziness, runny nose, cough, chest pain, fever)  Weakness in legs,  10. O2 SATURATION MONITOR:  "Do you use an oxygen saturation monitor (pulse oximeter) at home?" If Yes, ask: "What is your reading (oxygen level) today?" "What is your usual oxygen saturation reading?" (e.g., 95%)       *No Answer*  Protocols used: Breathing Difficulty-A-AH

## 2024-01-15 NOTE — Telephone Encounter (Signed)
 Returned pt call regarding anxiety medication and inhaler.  Pt stated that he can tell a difference with the inhaler.  States that its been better than the other inhalers he has used in the past.  Advised pt on the 10-14 day window for Buspar to work.  Pt verbalized understanding and stated that he met up with a friend to hang out and vent and explained that it has helped calm him down some.  No further questions or concerns.

## 2024-01-17 ENCOUNTER — Telehealth: Payer: Self-pay

## 2024-01-17 ENCOUNTER — Telehealth: Payer: Self-pay | Admitting: Nurse Practitioner

## 2024-01-17 DIAGNOSIS — R0602 Shortness of breath: Secondary | ICD-10-CM

## 2024-01-17 NOTE — Telephone Encounter (Signed)
-----   Message from [facility] T sent at 01/15/2024 11:41 AM EDT ----- Called patient reviewed all information and repeated back to me.  Pt prefers [place] location.  Pt complains of Buspar medication not working at all.  States that it takes a month for this to work so its not doable for him.  He states the need for more of a immediate relief medication for anxiety, says he is doing bad right now.  States that he has no NO alcohol since Friday but has been struggling and states he just wants to stop feeling like there is an elephant sitting on his chest.

## 2024-01-17 NOTE — Progress Notes (Signed)
 Complex Care Management Note Care Guide Note  01/17/2024 Name: Bryan Huffman MRN: [id number] DOB: 10/14/1970   Complex Care Management Outreach Attempts: An unsuccessful telephone outreach was attempted today to offer the patient information about available complex care management services.  Follow Up Plan:  Additional outreach attempts will be made to offer the patient complex care management information and services.   Encounter Outcome:  No Answer  Penne Lash , RMA     [place]  [facility], [facility] Guide  Direct Dial: [phone]  Website: [email or web address]

## 2024-01-28 ENCOUNTER — Other Ambulatory Visit: Payer: Self-pay | Admitting: Nurse Practitioner

## 2024-01-28 DIAGNOSIS — F419 Anxiety disorder, unspecified: Secondary | ICD-10-CM

## 2024-01-28 NOTE — Telephone Encounter (Signed)
 Copied from CRM [id number]. Topic: Clinical - Prescription Issue >> Jan 28, 2024  1:56 PM [place] wrote: Reason for CRM: BUSPAR  -not doing anything and SEROQUEL  50mg  is not helping him sleep at all. he used to take 100 MG and wants to go back up

## 2024-01-28 NOTE — Telephone Encounter (Signed)
 Copied from CRM [id number]. Topic: Clinical - Medication Refill >> Jan 28, 2024  1:53 PM Dimple Francis wrote: Most Recent Primary Care Visit:  Provider: Dorothe Gaster  Department: [facility]  Visit Type: OFFICE VISIT  Date: 01/11/2024  Medication: QUEtiapine  (SEROQUEL ) 50 MG tablet ;   Has the patient contacted their pharmacy? Yes (Agent: If no, request that the patient contact the pharmacy for the refill. If patient does not wish to contact the pharmacy document the reason why and proceed with request.) (Agent: If yes, when and what did the pharmacy advise?)  Is this the correct pharmacy for this prescription? Yes If no, delete pharmacy and type the correct one.  This is the patient's preferred pharmacy:  [id number] - [place], [place] - [address] AT [facility] [address] [place] [address] Phone: [phone] Fax: [phone]   Has the prescription been filled recently? Yes  Is the patient out of the medication? Yes  Has the patient been seen for an appointment in the last year OR does the patient have an upcoming appointment? Yes  Can we respond through MyChart? Yes  Agent: Please be advised that Rx refills may take up to 3 business days. We ask that you follow-up with your pharmacy.

## 2024-01-29 ENCOUNTER — Telehealth: Payer: Self-pay

## 2024-01-29 MED ORDER — QUETIAPINE FUMARATE 100 MG PO TABS: 100.0000 mg | ORAL_TABLET | Freq: Every day | ORAL | 0 refills | Status: DC

## 2024-01-29 NOTE — Telephone Encounter (Signed)
 Copied from CRM [id number]. Topic: Clinical - Medication Question >> Jan 29, 2024  8:49 AM Adonis Hoot wrote: Reason for CRM: Patient called in to check on status of medication  QUEtiapine  (SEROQUEL ) 50 MG tablet dosage being increased to help him sleep. He said that he's not able to sleep and the 50 mg isnt helping. He said that he wont be available until after 12 pm if someone tries to call him.

## 2024-01-29 NOTE — Addendum Note (Signed)
 Addended by: Dorothe Gaster on: 01/29/2024 12:06 PM   Modules accepted: Orders

## 2024-01-29 NOTE — Telephone Encounter (Signed)
seroquel 100 mg sent in

## 2024-01-30 ENCOUNTER — Telehealth: Payer: Self-pay

## 2024-01-30 NOTE — Telephone Encounter (Signed)
 Copied from CRM [id number]. Topic: Clinical - Medical Advice >> Jan 30, 2024 10:36 AM Adaysia C wrote: Reason for CRM: Patient called because he has still not received a follow up call from a nurse; he would like to discuss current health concerns he is having; please follow up with patient [phone]  Reached out and let know that Seroquel  was called in at higher dose. He wanted to make sure that Jolan Natal was aware of the call he sent in that the Buspar  was not helping. Advised I would send message to him to make him aware.

## 2024-01-30 NOTE — Progress Notes (Unsigned)
 Complex Care Management Note Care Guide Note  01/30/2024 Name: Bryan Huffman MRN: [id number] DOB: [birth year]   Complex Care Management Outreach Attempts: A second unsuccessful outreach was attempted today to offer the patient with information about available complex care management services.  Follow Up Plan:  Additional outreach attempts will be made to offer the patient complex care management information and services.   Encounter Outcome:  No Answer  Lenton Rail , RMA     [place]  [facility], [facility] Guide  Direct Dial: [phone]  Website: [email or web address]

## 2024-01-30 NOTE — Telephone Encounter (Signed)
 Contacted pt back.  Pt states that the buspar  is not helping at all.  Asked pt if he wanted to see matt sooner than later.  Pt declined and would like to keep appointment the same.

## 2024-01-30 NOTE — Telephone Encounter (Signed)
 We have a follow-up scheduled to discuss the BuSpar  use that not soon enough for him?

## 2024-02-01 NOTE — Progress Notes (Signed)
 Complex Care Management Note  Care Guide Note 02/01/2024 Name: Bryan Huffman MRN: [id number] DOB: Apr 11, 1971  [name] is a 53 y.o. year old male who sees Cable, Hoy Mackintosh, NP for primary care. I reached out to [employer] by phone today to offer complex care management services.  [name] was given information about Complex Care Management services today including:   The Complex Care Management services include support from the care team which includes your Nurse Care Manager, Clinical Social Worker, or Pharmacist.  The Complex Care Management team is here to help remove barriers to the health concerns and goals most important to you. Complex Care Management services are voluntary, and the patient may decline or stop services at any time by request to their care team member.   Complex Care Management Consent Status: Patient agreed to services and verbal consent obtained.   Follow up plan:  Telephone appointment with complex care management team member scheduled for:  02/11/2024  Encounter Outcome:  Patient Scheduled  Lenton Rail , RMA     [place]  [facility], [facility] Guide  Direct Dial: [phone]  Website: [email or web address]

## 2024-02-04 ENCOUNTER — Other Ambulatory Visit: Payer: Self-pay | Admitting: Nurse Practitioner

## 2024-02-04 ENCOUNTER — Ambulatory Visit: Payer: Self-pay

## 2024-02-04 DIAGNOSIS — F419 Anxiety disorder, unspecified: Secondary | ICD-10-CM

## 2024-02-04 DIAGNOSIS — R0602 Shortness of breath: Secondary | ICD-10-CM

## 2024-02-04 DIAGNOSIS — F322 Major depressive disorder, single episode, severe without psychotic features: Secondary | ICD-10-CM

## 2024-02-04 MED ORDER — VENLAFAXINE HCL ER 37.5 MG PO CP24: 37.5000 mg | ORAL_CAPSULE | Freq: Every day | ORAL | 0 refills | Status: DC

## 2024-02-04 NOTE — Telephone Encounter (Signed)
  Chief Complaint: cannot function, I keep telling yal about this and I aint getting any better.  Symptoms: severe anxiety, not able to sleep Frequency: ongoing Pertinent Negatives: Patient denies suicidal thoughts Disposition: [] ED /[] Urgent Care (no appt availability in office) / [x] Appointment(In office/virtual)/ []  [facility]/ [] Home Care/ [] Refused Recommended Disposition /[] [facility]/ []  Follow-up with PCP Additional Notes: offfered an appt today but pt stated he cannot come in until Wed. Appt made for Wed. Routing to office.  Copied from CRM [id number]. Topic: Clinical - Red Word Triage >> Feb 04, 2024  8:33 AM Crispin Dolphin wrote: Red Word that prompted transfer to Nurse Triage: Can't hardly breathe, feels like elephant on chest, can't sleep, can't function. Buspar  not working Reason for Disposition  Patient sounds very upset or troubled to the triager  Answer Assessment - Initial Assessment Questions 1. CONCERN: "Did anything happen that prompted you to call today?"      Buspar  is not helping increased anxiety feels like chest is heavy 2. ANXIETY SYMPTOMS: "Can you describe how you (your loved one; patient) have been feeling?" (e.g., tense, restless, panicky, anxious, keyed up, overwhelmed, sense of impending doom).      Anxious, difficulty sleeping,  3. ONSET: "How long have you been feeling this way?" (e.g., hours, days, weeks)     ongoing  5. FUNCTIONAL IMPAIRMENT: "How have these feelings affected your ability to do daily activities?" "Have you had more difficulty than usual doing your normal daily activities?" (e.g., getting better, same, worse; self-care, school, work, interactions)     worse 6. HISTORY: "Have you felt this way before?" "Have you ever been diagnosed with an anxiety problem in the past?" (e.g., generalized anxiety disorder, panic attacks, PTSD). If Yes, ask: "How was this problem treated?" (e.g., medicines, counseling, etc.)     Yes stated  has been seen several times for this but nothing is helping 7. RISK OF HARM - SUICIDAL IDEATION: "Do you ever have thoughts of hurting or killing yourself?" If Yes, ask:  "Do you have these feelings now?" "Do you have a plan on how you would do this?"     no 8. TREATMENT:  "What has been done so far to treat this anxiety?" (e.g., medicines, relaxation strategies). "What has helped?"     medicines 12. OTHER SYMPTOMS: "Do you have any other symptoms?" (e.g., feeling depressed, trouble concentrating, trouble sleeping, trouble breathing, palpitations or fast heartbeat, chest pain, sweating, nausea, or diarrhea)       Trouble breathing, palpitations, irritable "I keep telling yall about this and no one is helping"  Protocols used: Anxiety and Panic Attack-A-AH

## 2024-02-04 NOTE — Addendum Note (Signed)
 Addended by: Dorothe Gaster on: 02/04/2024 01:26 PM   Modules accepted: Orders

## 2024-02-04 NOTE — Telephone Encounter (Signed)
 Patient can stop the buspar  since it is not working. He does have dx of bipolar in the past. I will start him on effexor. If he has bipolar it can make it worse. We had this discussion in office. This medication is not quick acting and can take 4-6 weeks to have full therapeutic effects.   See if he is willing to see a psychiatrist

## 2024-02-04 NOTE — Telephone Encounter (Signed)
 If he does not want to do medication does he want to do therapy?

## 2024-02-04 NOTE — Telephone Encounter (Signed)
 Contacted pt.  Pt declined taking Effexor at this time.  Asked pt about seeing a psychiatrist.  Pt states that he will hold off on both until his appointment on 4/30.  States he wants to talk to matt.  Pt states that he has tried medication in the past and they have all made him feel worse.

## 2024-02-05 DIAGNOSIS — G4733 Obstructive sleep apnea (adult) (pediatric): Secondary | ICD-10-CM | POA: Diagnosis not present

## 2024-02-06 ENCOUNTER — Ambulatory Visit: Admitting: Nurse Practitioner

## 2024-02-06 NOTE — Progress Notes (Deleted)
   Acute Office Visit  Subjective:     Patient ID: Bryan Huffman, male    DOB: [birth year], 53 y.o.   MRN: [id number]  No chief complaint on file.   HPI Patient is in today for shortness of breath with a history of Hypertension, DM2, tobacco use, alcohol  dependence, MDD, shortness of breath, morbid obesity  Patient was seen in clinic on 01/11/2024 for shortness of breath.  At that point he endorsed shortness of breath starting around Christmas and was getting worse gradually.  He reports that he started smoking again around that time which has increased his shortness of breath.  He did endorse a productive cough with clear thick sputum.  He had been prescribed Breo in the past but stopped using it because he felt it was ineffective.  The shortness of breath is limiting his ability to work.  At last office visit he did mention he cannot walk down his driveway to the mailbox without getting breathless.  At that office visit we did prescribe budesonide -formoterol  160-4.5 mcg per actuation along with albuterol  inhaler.  Did obtain a chest x-ray that showed no active cardiopulmonary disease.  Also check a BNP that came back at 180 moderately elevated. Patient has been seen and evaluated by Dr. Randene Bustard he did undergo an echocardiogram on 03/30/2023.  At that juncture he had a normal EF of 60 to 65%.  Patient also had a CT coronary that showed no coronary flow limiting lesion.  Patient has had a chemical stress test done in the year 2020.  Last office visit with cardiology was 09/27/2022 for shortness of breath.  Of note patient is followed by Starr Eddy for OSA  MDD/anxiety: Patient was evaluated for the same on 425.  Patient states he has a psychiatry in the past and diagnosed with bipolar disorder and was placed on medication which made it worse.  To complicate things patient is currently abusing alcohol .  Patient was placed on buspirone  10 mg twice daily without great result.  Patient is called  back several times stating the same.  Did offer to put patient on an SNRI the patient politely declined.  Offered to refer patient to psychiatry and/or therapy.  Patient is using Seroquel  100 mg nightly for sleep but patient stating is not effective currently.  Patient does have a history of suicidal ideation in the past ROS      Objective:    There were no vitals taken for this visit. {Vitals History (Optional):23777}  Physical Exam  No results found for any visits on 02/06/24.      Assessment & Plan:   Problem List Items Addressed This Visit   None   No orders of the defined types were placed in this encounter.   No follow-ups on file.  Margarie Shay, NP

## 2024-02-06 NOTE — Telephone Encounter (Signed)
 Pt being seen in office today. 02/06/24

## 2024-02-11 ENCOUNTER — Telehealth: Payer: Self-pay | Admitting: Licensed Clinical Social Worker

## 2024-02-13 ENCOUNTER — Ambulatory Visit: Admitting: Nurse Practitioner

## 2024-02-15 DIAGNOSIS — G8929 Other chronic pain: Secondary | ICD-10-CM | POA: Diagnosis not present

## 2024-02-15 DIAGNOSIS — K219 Gastro-esophageal reflux disease without esophagitis: Secondary | ICD-10-CM | POA: Diagnosis not present

## 2024-02-15 DIAGNOSIS — Z881 Allergy status to other antibiotic agents status: Secondary | ICD-10-CM | POA: Diagnosis not present

## 2024-02-15 DIAGNOSIS — Z6841 Body Mass Index (BMI) 40.0 and over, adult: Secondary | ICD-10-CM | POA: Diagnosis not present

## 2024-02-15 DIAGNOSIS — E669 Obesity, unspecified: Secondary | ICD-10-CM | POA: Diagnosis not present

## 2024-02-15 DIAGNOSIS — E785 Hyperlipidemia, unspecified: Secondary | ICD-10-CM | POA: Diagnosis not present

## 2024-02-15 DIAGNOSIS — Z87891 Personal history of nicotine dependence: Secondary | ICD-10-CM | POA: Diagnosis not present

## 2024-02-15 DIAGNOSIS — R000 Tachycardia, unspecified: Secondary | ICD-10-CM | POA: Diagnosis not present

## 2024-02-15 DIAGNOSIS — F419 Anxiety disorder, unspecified: Secondary | ICD-10-CM | POA: Diagnosis not present

## 2024-02-15 DIAGNOSIS — R0600 Dyspnea, unspecified: Secondary | ICD-10-CM | POA: Diagnosis not present

## 2024-02-15 DIAGNOSIS — Z7982 Long term (current) use of aspirin: Secondary | ICD-10-CM | POA: Diagnosis not present

## 2024-02-15 DIAGNOSIS — I10 Essential (primary) hypertension: Secondary | ICD-10-CM | POA: Diagnosis not present

## 2024-02-15 DIAGNOSIS — R0602 Shortness of breath: Secondary | ICD-10-CM | POA: Diagnosis not present

## 2024-02-17 ENCOUNTER — Other Ambulatory Visit: Payer: Self-pay

## 2024-02-17 ENCOUNTER — Emergency Department (HOSPITAL_COMMUNITY)
Admission: EM | Admit: 2024-02-17 | Discharge: 2024-02-18 | Disposition: A | Discharge status: Other Acute Inpatient Hospital | Source: Home / Self Care | Attending: Emergency Medicine | Admitting: Emergency Medicine

## 2024-02-17 ENCOUNTER — Encounter (HOSPITAL_COMMUNITY): Payer: Self-pay | Admitting: *Deleted

## 2024-02-17 DIAGNOSIS — Z7984 Long term (current) use of oral hypoglycemic drugs: Secondary | ICD-10-CM | POA: Insufficient documentation

## 2024-02-17 DIAGNOSIS — Z79899 Other long term (current) drug therapy: Secondary | ICD-10-CM | POA: Insufficient documentation

## 2024-02-17 DIAGNOSIS — F332 Major depressive disorder, recurrent severe without psychotic features: Secondary | ICD-10-CM | POA: Insufficient documentation

## 2024-02-17 DIAGNOSIS — E119 Type 2 diabetes mellitus without complications: Secondary | ICD-10-CM | POA: Insufficient documentation

## 2024-02-17 DIAGNOSIS — R45851 Suicidal ideations: Secondary | ICD-10-CM | POA: Diagnosis not present

## 2024-02-17 DIAGNOSIS — I10 Essential (primary) hypertension: Secondary | ICD-10-CM | POA: Insufficient documentation

## 2024-02-17 DIAGNOSIS — E876 Hypokalemia: Secondary | ICD-10-CM | POA: Insufficient documentation

## 2024-02-17 DIAGNOSIS — F10220 Alcohol dependence with intoxication, uncomplicated: Secondary | ICD-10-CM | POA: Insufficient documentation

## 2024-02-17 DIAGNOSIS — F10920 Alcohol use, unspecified with intoxication, uncomplicated: Secondary | ICD-10-CM

## 2024-02-17 LAB — COMPREHENSIVE METABOLIC PANEL WITH GFR
ALT: 87 U/L — ABNORMAL HIGH (ref 0–44)
AST: 201 U/L — ABNORMAL HIGH (ref 15–41)
Albumin: 4.3 g/dL (ref 3.5–5.0)
Alkaline Phosphatase: 71 U/L (ref 38–126)
Anion gap: 18 — ABNORMAL HIGH (ref 5–15)
BUN: 11 mg/dL (ref 6–20)
CO2: 19 mmol/L — ABNORMAL LOW (ref 22–32)
Calcium: 8.9 mg/dL (ref 8.9–10.3)
Chloride: 99 mmol/L (ref 98–111)
Creatinine, Ser: 0.76 mg/dL (ref 0.61–1.24)
GFR, Estimated: >60 mL/min (ref >60)
Glucose, Bld: 146 mg/dL — ABNORMAL HIGH (ref 70–99)
Potassium: 3.2 mmol/L — ABNORMAL LOW (ref 3.5–5.1)
Sodium: 136 mmol/L (ref 135–145)
Total Bilirubin: 0.8 mg/dL (ref 0.0–1.2)
Total Protein: 8.1 g/dL (ref 6.5–8.1)

## 2024-02-17 LAB — CBC WITH DIFFERENTIAL/PLATELET
Abs Immature Granulocytes: 0.04 10*3/uL (ref 0.00–0.07)
Basophils Absolute: 0.1 10*3/uL (ref 0.0–0.1)
Basophils Relative: 1 %
Eosinophils Absolute: 0 10*3/uL (ref 0.0–0.5)
Eosinophils Relative: 1 %
HCT: 40.9 % (ref 39.0–52.0)
Hemoglobin: 13.7 g/dL (ref 13.0–17.0)
Immature Granulocytes: 1 %
Lymphocytes Relative: 21 %
Lymphs Abs: 1.8 10*3/uL (ref 0.7–4.0)
MCH: 28.8 pg (ref 26.0–34.0)
MCHC: 33.5 g/dL (ref 30.0–36.0)
MCV: 86.1 fL (ref 80.0–100.0)
Monocytes Absolute: 0.4 10*3/uL (ref 0.1–1.0)
Monocytes Relative: 5 %
Neutro Abs: 6.3 10*3/uL (ref 1.7–7.7)
Neutrophils Relative %: 71 %
Platelets: 224 10*3/uL (ref 150–400)
RBC: 4.75 MIL/uL (ref 4.22–5.81)
RDW: 18.5 % — ABNORMAL HIGH (ref 11.5–15.5)
WBC: 8.6 10*3/uL (ref 4.0–10.5)
nRBC: 0 % (ref 0.0–0.2)

## 2024-02-17 LAB — ACETAMINOPHEN LEVEL: Acetaminophen (Tylenol), Serum: <10 ug/mL — ABNORMAL LOW (ref 10–30)

## 2024-02-17 LAB — ETHANOL: Alcohol, Ethyl (B): 246 mg/dL — ABNORMAL HIGH (ref <15)

## 2024-02-17 LAB — SALICYLATE LEVEL: Salicylate Lvl: <7 mg/dL — ABNORMAL LOW (ref 7.0–30.0)

## 2024-02-17 NOTE — ED Triage Notes (Addendum)
 Pt told family he "was ready to go be with his brother", but he says he would not harm himself because he has a son to take care of. Reports drinking yesterday so much that he passed out. Several abrasions to the lower extremities from falling last night. Reported chronic back pain. Did have some beers today. He would like to get help for his alcohol  and drug use (marijuana and cocaine). Denies current SI/HI.

## 2024-02-17 NOTE — ED Provider Notes (Signed)
 [place] EMERGENCY DEPARTMENT AT [facility] Provider Note   CSN: [id number] Arrival date & time: 02/17/24  2147     History Chief Complaint  Patient presents with   Alcohol  Problem    Bryan Huffman is a 53 y.o. male.  Patient with past history significant for alcohol  dependence, polysubstance abuse, morbid obesity, hypertension, type 2 diabetes here with concerns of alcohol  intoxication and suicidal ideation.  Patient reports that he has been drinking heavily for several months and is seeking help with detox.  No history of DT or alcohol -related seizures.  He does also endorse some suicidal ideation at this time as he states that it is the anniversary of his brother's passing 9 years ago.  States that he would like to go be with his brother.  He would not elaborate further on this.  Patient's family called PD out of concern for patient's psychiatric state.  No prior history of suicide attempts.   Alcohol  Problem       Home Medications Prior to Admission medications   Medication Sig Start Date End Date Taking? Authorizing Provider  albuterol  (VENTOLIN  HFA) 108 (90 Base) MCG/ACT inhaler TAKE 2 PUFFS BY MOUTH EVERY 6 HOURS AS NEEDED FOR WHEEZE OR SHORTNESS OF BREATH 02/05/24   Dorothe Gaster, NP  amLODipine  (NORVASC ) 5 MG tablet Take 1 tablet (5 mg total) by mouth daily. 12/07/23   Dorothe Gaster, NP  B-D INTEGRA SYRINGE 23G X 1" 3 ML MISC 1 (ONE) APPLICATION APPLICATION AS DIRECTED 05/02/23   [provider]  BD HYPODERMIC NEEDLE 18G X 1" MISC 1 (ONE) APPLICATION APPLICATION AS DIRECTED 01/02/23   [provider]  Blood Glucose Monitoring Suppl DEVI 1 each by Does not apply route in the morning, at noon, and at bedtime. May substitute to any manufacturer covered by patient's insurance. 08/21/23   Dorothe Gaster, NP  budesonide -formoterol  (SYMBICORT ) 160-4.5 MCG/ACT inhaler Inhale 2 puffs into the lungs 2 (two) times daily. 01/11/24   Dorothe Gaster, NP   dexlansoprazole  (DEXILANT ) 60 MG capsule Take 1 capsule (60 mg total) by mouth daily. 12/07/23   Dorothe Gaster, NP  Glucose Blood (BLOOD GLUCOSE TEST STRIPS) STRP 1 each by In Vitro route daily. May substitute to any manufacturer covered by patient's insurance. Patient not taking: Reported on 01/11/2024 08/21/23   Dorothe Gaster, NP  ibuprofen  (ADVIL ) 200 MG tablet Take 800 mg by mouth 3 (three) times daily as needed for moderate pain.    [provider]  Icosapent  Ethyl (VASCEPA ) 0.5 g CAPS Take 2 capsules (1 g total) by mouth 2 (two) times daily. 07/18/23   Dorothe Gaster, NP  Lancets Misc. MISC 1 each by Does not apply route daily. May substitute to any manufacturer covered by patient's insurance. Patient not taking: Reported on 01/11/2024 08/21/23   Dorothe Gaster, NP  lisinopril  (ZESTRIL ) 10 MG tablet Take 2 tablets (20 mg total) by mouth daily. Patient taking differently: Take 10 mg by mouth daily. 07/21/19   Trish Furl, MD  metFORMIN  (GLUCOPHAGE -XR) 500 MG 24 hr tablet Take 1 tablet (500 mg total) by mouth 2 (two) times daily with a meal. 01/11/24   Dorothe Gaster, NP  metoprolol  succinate (TOPROL -XL) 100 MG 24 hr tablet Take 1 tablet (100 mg total) by mouth daily. Take with or immediately following a meal. 12/07/23   Dorothe Gaster, NP  QUEtiapine  (SEROQUEL ) 100 MG tablet Take 1 tablet (100 mg total) by mouth at  bedtime. 01/29/24   Dorothe Gaster, NP  SYRINGE-NEEDLE, DISP, 3 ML (B-D 3CC LUER-LOK SYR 18GX1-1/2) 18G X 1-1/2" 3 ML MISC USE TO DRAW UP TESTOSTERONE  AS DIRECTED    [provider]  testosterone  cypionate (DEPOTESTOSTERONE CYPIONATE) 200 MG/ML injection Inject 100 mg into the muscle every 14 (fourteen) days. 07/05/22   [provider]  venlafaxine  XR (EFFEXOR  XR) 37.5 MG 24 hr capsule Take 1 capsule (37.5 mg total) by mouth daily with breakfast. 02/04/24   Dorothe Gaster, NP      Allergies    Bee venom, Chlorthalidone , and Statins    Review of Systems   Review  of Systems  Psychiatric/Behavioral:  Positive for suicidal ideas.   All other systems reviewed and are negative.   Physical Exam Updated Vital Signs BP 139/88   Pulse (!) 112   Temp 97.9 F (36.6 C) (Oral)   Resp (!) 21   Ht 5\' 6"  (1.676 m)   Wt 113.4 kg   SpO2 97%   BMI 40.35 kg/m  Physical Exam Vitals and nursing note reviewed.  Constitutional:      General: He is not in acute distress.    Appearance: He is well-developed.  HENT:     Head: Normocephalic and atraumatic.  Eyes:     Conjunctiva/sclera: Conjunctivae normal.  Cardiovascular:     Rate and Rhythm: Normal rate and regular rhythm.     Heart sounds: No murmur heard. Pulmonary:     Effort: Pulmonary effort is normal. No respiratory distress.     Breath sounds: Normal breath sounds.  Abdominal:     Palpations: Abdomen is soft.     Tenderness: There is no abdominal tenderness.  Musculoskeletal:        General: No swelling.     Cervical back: Neck supple.  Skin:    General: Skin is warm and dry.     Capillary Refill: Capillary refill takes less than 2 seconds.  Neurological:     Mental Status: He is alert.  Psychiatric:        Mood and Affect: Mood is depressed. Affect is blunt.     ED Results / Procedures / Treatments   Labs (all labs ordered are listed, but only abnormal results are displayed) Labs Reviewed  CBC WITH DIFFERENTIAL/PLATELET - Abnormal; Notable for the following components:      Result Value   RDW 18.5 (*)    All other components within normal limits  COMPREHENSIVE METABOLIC PANEL WITH GFR - Abnormal; Notable for the following components:   Potassium 3.2 (*)    CO2 19 (*)    Glucose, Bld 146 (*)    AST 201 (*)    ALT 87 (*)    Anion gap 18 (*)    All other components within normal limits  ETHANOL - Abnormal; Notable for the following components:   Alcohol , Ethyl (B) 246 (*)    All other components within normal limits  URINALYSIS, ROUTINE W REFLEX MICROSCOPIC - Abnormal; Notable  for the following components:   APPearance HAZY (*)    Glucose, UA 50 (*)    Hgb urine dipstick SMALL (*)    Ketones, ur 20 (*)    Protein, ur 100 (*)    Bacteria, UA RARE (*)    All other components within normal limits  RAPID URINE DRUG SCREEN, HOSP PERFORMED - Abnormal; Notable for the following components:   Cocaine POSITIVE (*)    Benzodiazepines POSITIVE (*)    Tetrahydrocannabinol  POSITIVE (*)    All other components within normal limits  ACETAMINOPHEN  LEVEL - Abnormal; Notable for the following components:   Acetaminophen  (Tylenol ), Serum <10 (*)    All other components within normal limits  SALICYLATE LEVEL - Abnormal; Notable for the following components:   Salicylate Lvl <7.0 (*)    All other components within normal limits    EKG None  Radiology No results found.  Procedures Procedures    Medications Ordered in ED Medications  potassium chloride  SA (KLOR-CON  M) CR tablet 40 mEq (40 mEq Oral Given 02/18/24 0149)    ED Course/ Medical Decision Making/ A&P                                 Medical Decision Making Amount and/or Complexity of Data Reviewed Labs: ordered.  Risk Prescription drug management.   This patient presents to the ED for concern of alcohol  intoxication, depression.  Differential diagnosis includes suicidal ideation, suicide attempt, alcohol  withdrawal, delirium tremens   Lab Tests:  I Ordered, and personally interpreted labs.  The pertinent results include: CBC unremarkable, CMP with mild hypokalemia 3.2, ethanol elevated at 246, acetaminophen  and salicylate levels unremarkable, UDS was positive for cocaine, benzodiazepine, and tetrahydrocannabinol, UA with ketonuria but no obvious signs of infection   Medicines ordered and prescription drug management:  I ordered medication including potassium  for hypokalemia  Reevaluation of the patient after these medicines showed that the patient improved I have reviewed the patients home  medicines and have made adjustments as needed   Problem List / ED Course:  Patient has history significant for alcohol  dependence, polysubstance abuse, morbid obesity, hypertension here with concerns of alcohol  intoxication and suicidal ideation.  He reports that he currently has feelings of suicidal thoughts due to this being the passing of his brother 9 years ago.  States that his drinking heavily for several months.  He endorses pain in his back which is chronic in nature.  States that he took a small fall yesterday with some noted abrasions to the bilateral knees.  Tolerates weightbearing. On exam, patient is somewhat disheveled with a flat and depressed affect and mood.  He would not elaborate further on his thoughts of suicide.  States that he would rather go be with his brother who has now passed.  Denies any specific attempts at suicide recently.  He is seeking assistance with behavioral health urgent care or other facility that can help with his thoughts and his substance use disorder. Labs show alcohol  intoxication. Potassium low, repleted with PO potassium. Will proceed with psychiatric evaluation and medical clearance.  The patient is medically cleared, will place TTS consult for further evaluation.  Patient is currently here voluntarily and IVC is not placed. TTS consult recommending inpatient admission, Albertina Alpers, NP. [facility] full. Currently being reviewed for admission to [facility].   Final Clinical Impression(s) / ED Diagnoses Final diagnoses:  Alcoholic intoxication without complication (HCC)  Suicidal ideation  Hypokalemia    Rx / DC Orders ED Discharge Orders     None         Tita Terhaar A, PA-C 02/18/24 0225    Iva Mariner, MD 02/18/24 [phone]

## 2024-02-17 NOTE — ED Triage Notes (Signed)
 Pt arrives via [facility], with c/o SI today. Today is the Anniversary for his brothers death and he is feeling suicidal, has had 10 beers today, no plan, 152/84, hr 107, glucose 166 97% ra.

## 2024-02-18 ENCOUNTER — Inpatient Hospital Stay (HOSPITAL_COMMUNITY)
Admission: AD | Admit: 2024-02-18 | Discharge: 2024-02-24 | DRG: 885 | Disposition: A | Discharge status: Home / Self Care | Source: Intra-hospital | Attending: Psychiatry | Admitting: Psychiatry

## 2024-02-18 ENCOUNTER — Ambulatory Visit: Admitting: Nurse Practitioner

## 2024-02-18 DIAGNOSIS — Z8249 Family history of ischemic heart disease and other diseases of the circulatory system: Secondary | ICD-10-CM | POA: Diagnosis not present

## 2024-02-18 DIAGNOSIS — E119 Type 2 diabetes mellitus without complications: Secondary | ICD-10-CM | POA: Diagnosis present

## 2024-02-18 DIAGNOSIS — K649 Unspecified hemorrhoids: Secondary | ICD-10-CM | POA: Diagnosis present

## 2024-02-18 DIAGNOSIS — Z560 Unemployment, unspecified: Secondary | ICD-10-CM

## 2024-02-18 DIAGNOSIS — F10239 Alcohol dependence with withdrawal, unspecified: Secondary | ICD-10-CM | POA: Diagnosis not present

## 2024-02-18 DIAGNOSIS — Z96642 Presence of left artificial hip joint: Secondary | ICD-10-CM | POA: Diagnosis not present

## 2024-02-18 DIAGNOSIS — Z79899 Other long term (current) drug therapy: Secondary | ICD-10-CM

## 2024-02-18 DIAGNOSIS — E876 Hypokalemia: Secondary | ICD-10-CM | POA: Diagnosis present

## 2024-02-18 DIAGNOSIS — F411 Generalized anxiety disorder: Secondary | ICD-10-CM | POA: Diagnosis present

## 2024-02-18 DIAGNOSIS — Z7951 Long term (current) use of inhaled steroids: Secondary | ICD-10-CM

## 2024-02-18 DIAGNOSIS — Y908 Blood alcohol level of 240 mg/100 ml or more: Secondary | ICD-10-CM | POA: Diagnosis present

## 2024-02-18 DIAGNOSIS — G8929 Other chronic pain: Secondary | ICD-10-CM | POA: Diagnosis not present

## 2024-02-18 DIAGNOSIS — R45851 Suicidal ideations: Secondary | ICD-10-CM | POA: Diagnosis present

## 2024-02-18 DIAGNOSIS — Z7984 Long term (current) use of oral hypoglycemic drugs: Secondary | ICD-10-CM

## 2024-02-18 DIAGNOSIS — F1910 Other psychoactive substance abuse, uncomplicated: Secondary | ICD-10-CM | POA: Diagnosis present

## 2024-02-18 DIAGNOSIS — Z5941 Food insecurity: Secondary | ICD-10-CM

## 2024-02-18 DIAGNOSIS — Z833 Family history of diabetes mellitus: Secondary | ICD-10-CM

## 2024-02-18 DIAGNOSIS — R519 Headache, unspecified: Secondary | ICD-10-CM | POA: Diagnosis not present

## 2024-02-18 DIAGNOSIS — I10 Essential (primary) hypertension: Secondary | ICD-10-CM | POA: Diagnosis not present

## 2024-02-18 DIAGNOSIS — F1020 Alcohol dependence, uncomplicated: Principal | ICD-10-CM | POA: Diagnosis present

## 2024-02-18 DIAGNOSIS — F332 Major depressive disorder, recurrent severe without psychotic features: Secondary | ICD-10-CM | POA: Diagnosis not present

## 2024-02-18 DIAGNOSIS — F10220 Alcohol dependence with intoxication, uncomplicated: Secondary | ICD-10-CM | POA: Diagnosis present

## 2024-02-18 DIAGNOSIS — G4733 Obstructive sleep apnea (adult) (pediatric): Secondary | ICD-10-CM | POA: Diagnosis not present

## 2024-02-18 DIAGNOSIS — Z9151 Personal history of suicidal behavior: Secondary | ICD-10-CM | POA: Diagnosis not present

## 2024-02-18 DIAGNOSIS — F17290 Nicotine dependence, other tobacco product, uncomplicated: Secondary | ICD-10-CM | POA: Diagnosis not present

## 2024-02-18 DIAGNOSIS — K219 Gastro-esophageal reflux disease without esophagitis: Secondary | ICD-10-CM | POA: Diagnosis present

## 2024-02-18 DIAGNOSIS — F17210 Nicotine dependence, cigarettes, uncomplicated: Secondary | ICD-10-CM | POA: Diagnosis not present

## 2024-02-18 DIAGNOSIS — J45909 Unspecified asthma, uncomplicated: Secondary | ICD-10-CM | POA: Diagnosis present

## 2024-02-18 LAB — RAPID URINE DRUG SCREEN, HOSP PERFORMED
Amphetamines: NOT DETECTED
Barbiturates: NOT DETECTED
Benzodiazepines: POSITIVE — AB
Cocaine: POSITIVE — AB
Opiates: NOT DETECTED
Tetrahydrocannabinol: POSITIVE — AB

## 2024-02-18 LAB — URINALYSIS, ROUTINE W REFLEX MICROSCOPIC
Bilirubin Urine: NEGATIVE
Glucose, UA: 50 mg/dL — AB
Ketones, ur: 20 mg/dL — AB
Leukocytes,Ua: NEGATIVE
Nitrite: NEGATIVE
Protein, ur: 100 mg/dL — AB
Specific Gravity, Urine: 1.021 (ref 1.005–1.030)
pH: 5 (ref 5.0–8.0)

## 2024-02-18 MED ORDER — METFORMIN HCL ER 500 MG PO TB24
500.0000 mg | ORAL_TABLET | Freq: Two times a day (BID) | ORAL | Status: DC
  Administered 2024-02-19 – 2024-02-24 (×11): 500 mg via ORAL
  Filled 2024-02-18 (×14): qty 1

## 2024-02-18 MED ORDER — ALBUTEROL SULFATE HFA 108 (90 BASE) MCG/ACT IN AERS: 1.0000 | INHALATION_SPRAY | Freq: Four times a day (QID) | RESPIRATORY_TRACT | Status: DC | PRN

## 2024-02-18 MED ORDER — FLUTICASONE FUROATE-VILANTEROL 200-25 MCG/ACT IN AEPB
1.0000 | INHALATION_SPRAY | Freq: Every day | RESPIRATORY_TRACT | Status: DC
  Administered 2024-02-19 – 2024-02-24 (×6): 1 via RESPIRATORY_TRACT
  Filled 2024-02-18: qty 28

## 2024-02-18 MED ORDER — POTASSIUM CHLORIDE CRYS ER 20 MEQ PO TBCR
40.0000 meq | EXTENDED_RELEASE_TABLET | Freq: Once | ORAL | Status: AC
  Administered 2024-02-18: 40 meq via ORAL
  Filled 2024-02-18: qty 2

## 2024-02-18 MED ORDER — HALOPERIDOL LACTATE 5 MG/ML IJ SOLN: 5.0000 mg | Freq: Three times a day (TID) | INTRAMUSCULAR | Status: DC | PRN

## 2024-02-18 MED ORDER — FOLIC ACID 1 MG PO TABS: 1.0000 mg | ORAL_TABLET | Freq: Every day | ORAL | Status: DC

## 2024-02-18 MED ORDER — HALOPERIDOL LACTATE 5 MG/ML IJ SOLN: 10.0000 mg | Freq: Three times a day (TID) | INTRAMUSCULAR | Status: DC | PRN

## 2024-02-18 MED ORDER — AMLODIPINE BESYLATE 5 MG PO TABS
5.0000 mg | ORAL_TABLET | Freq: Every day | ORAL | Status: DC
Start: 2024-02-18 — End: 2024-02-18
  Administered 2024-02-18: 5 mg via ORAL
  Filled 2024-02-18: qty 1

## 2024-02-18 MED ORDER — ACETAMINOPHEN 325 MG PO TABS: 650.0000 mg | ORAL_TABLET | Freq: Four times a day (QID) | ORAL | Status: DC | PRN

## 2024-02-18 MED ORDER — HALOPERIDOL 5 MG PO TABS
5.0000 mg | ORAL_TABLET | Freq: Three times a day (TID) | ORAL | Status: DC | PRN
  Administered 2024-02-22: 5 mg via ORAL
  Filled 2024-02-18: qty 1

## 2024-02-18 MED ORDER — DIPHENHYDRAMINE HCL 25 MG PO CAPS
50.0000 mg | ORAL_CAPSULE | Freq: Three times a day (TID) | ORAL | Status: DC | PRN
  Administered 2024-02-22: 50 mg via ORAL
  Filled 2024-02-18: qty 2

## 2024-02-18 MED ORDER — LISINOPRIL 10 MG PO TABS
10.0000 mg | ORAL_TABLET | Freq: Every day | ORAL | Status: DC
  Administered 2024-02-18: 10 mg via ORAL
  Filled 2024-02-18: qty 1

## 2024-02-18 MED ORDER — AMLODIPINE BESYLATE 5 MG PO TABS
5.0000 mg | ORAL_TABLET | Freq: Every day | ORAL | Status: DC
  Administered 2024-02-19 – 2024-02-22 (×4): 5 mg via ORAL
  Filled 2024-02-18 (×6): qty 1

## 2024-02-18 MED ORDER — LORAZEPAM 2 MG/ML IJ SOLN: 2.0000 mg | Freq: Three times a day (TID) | INTRAMUSCULAR | Status: DC | PRN

## 2024-02-18 MED ORDER — LORAZEPAM 1 MG PO TABS
1.0000 mg | ORAL_TABLET | ORAL | Status: DC | PRN
  Administered 2024-02-18: 1 mg via ORAL
  Filled 2024-02-18: qty 1

## 2024-02-18 MED ORDER — VITAMIN B-1 100 MG PO TABS
100.0000 mg | ORAL_TABLET | Freq: Every day | ORAL | Status: DC
  Administered 2024-02-19 – 2024-02-24 (×6): 100 mg via ORAL
  Filled 2024-02-18 (×7): qty 1

## 2024-02-18 MED ORDER — DIPHENHYDRAMINE HCL 50 MG/ML IJ SOLN: 50.0000 mg | Freq: Three times a day (TID) | INTRAMUSCULAR | Status: DC | PRN

## 2024-02-18 MED ORDER — IBUPROFEN 200 MG PO TABS
200.0000 mg | ORAL_TABLET | Freq: Four times a day (QID) | ORAL | Status: DC | PRN
  Administered 2024-02-18: 200 mg via ORAL
  Filled 2024-02-18: qty 1

## 2024-02-18 MED ORDER — LORAZEPAM 2 MG/ML IJ SOLN: 1.0000 mg | INTRAMUSCULAR | Status: DC | PRN

## 2024-02-18 MED ORDER — METOPROLOL SUCCINATE ER 50 MG PO TB24
100.0000 mg | ORAL_TABLET | Freq: Every day | ORAL | Status: DC
  Administered 2024-02-18: 100 mg via ORAL
  Filled 2024-02-18: qty 2

## 2024-02-18 MED ORDER — METFORMIN HCL ER 500 MG PO TB24
500.0000 mg | ORAL_TABLET | Freq: Two times a day (BID) | ORAL | Status: DC
  Administered 2024-02-18 (×2): 500 mg via ORAL
  Filled 2024-02-18 (×3): qty 1

## 2024-02-18 MED ORDER — FLUTICASONE FUROATE-VILANTEROL 200-25 MCG/ACT IN AEPB
1.0000 | INHALATION_SPRAY | Freq: Every day | RESPIRATORY_TRACT | Status: DC
  Administered 2024-02-18: 1 via RESPIRATORY_TRACT
  Filled 2024-02-18: qty 28

## 2024-02-18 MED ORDER — FOLIC ACID 1 MG PO TABS
1.0000 mg | ORAL_TABLET | Freq: Every day | ORAL | Status: DC
  Administered 2024-02-19 – 2024-02-24 (×6): 1 mg via ORAL
  Filled 2024-02-18 (×8): qty 1

## 2024-02-18 MED ORDER — LISINOPRIL 10 MG PO TABS
10.0000 mg | ORAL_TABLET | Freq: Every day | ORAL | Status: DC
  Administered 2024-02-19 – 2024-02-22 (×4): 10 mg via ORAL
  Filled 2024-02-18: qty 1
  Filled 2024-02-18: qty 2
  Filled 2024-02-18 (×3): qty 1
  Filled 2024-02-18: qty 2
  Filled 2024-02-18 (×2): qty 1

## 2024-02-18 MED ORDER — ADULT MULTIVITAMIN W/MINERALS CH
1.0000 | ORAL_TABLET | Freq: Every day | ORAL | Status: DC
  Administered 2024-02-19 – 2024-02-24 (×6): 1 via ORAL
  Filled 2024-02-18 (×7): qty 1

## 2024-02-18 MED ORDER — THIAMINE HCL 100 MG/ML IJ SOLN: 100.0000 mg | Freq: Every day | INTRAMUSCULAR | Status: DC

## 2024-02-18 MED ORDER — METOPROLOL SUCCINATE ER 50 MG PO TB24
100.0000 mg | ORAL_TABLET | Freq: Every day | ORAL | Status: DC
  Administered 2024-02-19 – 2024-02-24 (×6): 100 mg via ORAL
  Filled 2024-02-18 (×7): qty 1

## 2024-02-18 MED ORDER — ALUM & MAG HYDROXIDE-SIMETH 200-200-20 MG/5ML PO SUSP: 30.0000 mL | ORAL | Status: DC | PRN

## 2024-02-18 MED ORDER — THIAMINE MONONITRATE 100 MG PO TABS
100.0000 mg | ORAL_TABLET | Freq: Every day | ORAL | Status: DC
  Administered 2024-02-18: 100 mg via ORAL
  Filled 2024-02-18: qty 1

## 2024-02-18 MED ORDER — LORAZEPAM 1 MG PO TABS
1.0000 mg | ORAL_TABLET | ORAL | Status: AC | PRN
  Administered 2024-02-18 – 2024-02-20 (×4): 1 mg via ORAL
  Filled 2024-02-18 (×4): qty 1

## 2024-02-18 MED ORDER — LORAZEPAM 2 MG/ML IJ SOLN: 1.0000 mg | INTRAMUSCULAR | Status: AC | PRN

## 2024-02-18 MED ORDER — ADULT MULTIVITAMIN W/MINERALS CH
1.0000 | ORAL_TABLET | Freq: Every day | ORAL | Status: DC
  Administered 2024-02-18: 1 via ORAL
  Filled 2024-02-18: qty 1

## 2024-02-18 NOTE — ED Notes (Signed)
 Pt has been cooperative with staff.  Pt has order to go to [facility] and vol papers are signed and in pts folder to go and a bottle of Sterile Water  to go with [facility] to [facility].  Pt on CPAP to sleep and they have got one that needs the sterile water .  Report could not be called prior to shift change. Vol papers were faxed over.

## 2024-02-18 NOTE — ED Notes (Signed)
 Report given to Michael, RN at [facility] and [employer] here to take patient to [facility] bed 400-2.

## 2024-02-18 NOTE — ED Notes (Signed)
 TTS tele consult at bedside at this time.

## 2024-02-18 NOTE — Progress Notes (Signed)
 TOC consulted for substance abuse resources. Resource attached to AVS. Pt is under TTS care at this time. No additional TOC needs. TOC signing off.

## 2024-02-18 NOTE — ED Notes (Signed)
 Notes for pt to go to [facility] tonight at 2000   400-2 tonight at 2000.  2 mins TM Erwin Heck, NP Give me the doctor let me put his orders since we have bed.  1 min Lovett Ruck, RN Can we get voluntary consent into the media tab or faxed to [phone].  1  DR Thirza Fleet MD.   1 min Erwin Heck, NP ok thanks.  1 min Lovett Ruck, RN To call report on next shift: [phone].  JO DR Will need updated VS WNL.
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
## 2024-02-18 NOTE — Progress Notes (Signed)
 LCSW Progress Note:  Rutherford Groves  MRN: [id number]  02/18/2024 7:54 pm  Patient Admission Update - [facility]  Date of Acceptance: 02/18/2024 Bed Assignment: 400-2  Clinical Justification: Patient meets criteria for inpatient psychiatric hospitalization per Arvell Latin, NP.  Attending Physician:  Dr. Zouev, MD  Receiving Unit Contact: Adult Unit [phone]  Admission Timing: Patient may arrive after shift change tonight; [facility] AC will provide further updates.  Care Team Notified: Kathryn Parish, RN [facility]) Arvell Latin, NP Azalea Bolder, Paramedic Jacquelyn Blue, RN

## 2024-02-18 NOTE — Progress Notes (Addendum)
   02/18/24 0232  BiPAP/CPAP/SIPAP  $ Face Mask Small Yes  BiPAP/CPAP/SIPAP Pt Type Adult  BiPAP/CPAP/SIPAP Resmed  Mask Type Nasal mask  Dentures removed? Not applicable  Mask Size Small  Respiratory Rate 20 breaths/min  EPAP 15 cmH2O  FiO2 (%) 21 %  Patient Home Machine No  Patient Home Mask No  Patient Home Tubing No  Auto Titrate No  Device Plugged into RED Power Outlet Yes  BiPAP/CPAP /SiPAP Vitals  Pulse Rate (!) 114  Resp 20  SpO2 98 %  Bilateral Breath Sounds Clear   Only CPAP and tubing related to CPAP in room with patient.  All other extras have been removed from room.

## 2024-02-18 NOTE — Progress Notes (Deleted)
 Established Patient Office Visit  Subjective   Patient ID: Bryan Huffman, male    DOB: 12-21-1970  Age: 53 y.o. MRN: [id number]  No chief complaint on file.   HPI  SHOB:  MDD/anxiety:  {History (Optional):23778}  ROS    Objective:      There were no vitals taken for this visit. {Vitals History (Optional):23777}  Physical Exam   Results for orders placed or performed during the hospital encounter of 02/17/24  CBC with Differential  Result Value Ref Range   WBC 8.6 4.0 - 10.5 K/uL   RBC 4.75 4.22 - 5.81 MIL/uL   Hemoglobin 13.7 13.0 - 17.0 g/dL   HCT 95.6 21.3 - 08.6 %   MCV 86.1 80.0 - 100.0 fL   MCH 28.8 26.0 - 34.0 pg   MCHC 33.5 30.0 - 36.0 g/dL   RDW 57.8 (H) 46.9 - 62.9 %   Platelets 224 150 - 400 K/uL   nRBC 0.0 0.0 - 0.2 %   Neutrophils Relative % 71 %   Neutro Abs 6.3 1.7 - 7.7 K/uL   Lymphocytes Relative 21 %   Lymphs Abs 1.8 0.7 - 4.0 K/uL   Monocytes Relative 5 %   Monocytes Absolute 0.4 0.1 - 1.0 K/uL   Eosinophils Relative 1 %   Eosinophils Absolute 0.0 0.0 - 0.5 K/uL   Basophils Relative 1 %   Basophils Absolute 0.1 0.0 - 0.1 K/uL   Immature Granulocytes 1 %   Abs Immature Granulocytes 0.04 0.00 - 0.07 K/uL  Comprehensive metabolic panel  Result Value Ref Range   Sodium 136 135 - 145 mmol/L   Potassium 3.2 (L) 3.5 - 5.1 mmol/L   Chloride 99 98 - 111 mmol/L   CO2 19 (L) 22 - 32 mmol/L   Glucose, Bld 146 (H) 70 - 99 mg/dL   BUN 11 6 - 20 mg/dL   Creatinine, Ser 5.28 0.61 - 1.24 mg/dL   Calcium 8.9 8.9 - 41.3 mg/dL   Total Protein 8.1 6.5 - 8.1 g/dL   Albumin 4.3 3.5 - 5.0 g/dL   AST 244 (H) 15 - 41 U/L   ALT 87 (H) 0 - 44 U/L   Alkaline Phosphatase 71 38 - 126 U/L   Total Bilirubin 0.8 0.0 - 1.2 mg/dL   GFR, Estimated >01 >02 mL/min   Anion gap 18 (H) 5 - 15  Ethanol  Result Value Ref Range   Alcohol , Ethyl (B) 246 (H) <15 mg/dL  Urinalysis, Routine w reflex microscopic -Urine, Clean Catch  Result Value Ref Range   Color,  Urine YELLOW YELLOW   APPearance HAZY (A) CLEAR   Specific Gravity, Urine 1.021 1.005 - 1.030   pH 5.0 5.0 - 8.0   Glucose, UA 50 (A) NEGATIVE mg/dL   Hgb urine dipstick SMALL (A) NEGATIVE   Bilirubin Urine NEGATIVE NEGATIVE   Ketones, ur 20 (A) NEGATIVE mg/dL   Protein, ur 725 (A) NEGATIVE mg/dL   Nitrite NEGATIVE NEGATIVE   Leukocytes,Ua NEGATIVE NEGATIVE   RBC / HPF 0-5 0 - 5 RBC/hpf   WBC, UA 0-5 0 - 5 WBC/hpf   Bacteria, UA RARE (A) NONE SEEN   Squamous Epithelial / HPF 0-5 0 - 5 /HPF   Mucus PRESENT    Hyaline Casts, UA PRESENT   Rapid urine drug screen (hospital performed)  Result Value Ref Range   Opiates NONE DETECTED NONE DETECTED   Cocaine POSITIVE (A) NONE DETECTED   Benzodiazepines POSITIVE (A) NONE DETECTED  Amphetamines NONE DETECTED NONE DETECTED   Tetrahydrocannabinol POSITIVE (A) NONE DETECTED   Barbiturates NONE DETECTED NONE DETECTED  Acetaminophen  level  Result Value Ref Range   Acetaminophen  (Tylenol ), Serum <10 (L) 10 - 30 ug/mL  Salicylate level  Result Value Ref Range   Salicylate Lvl <7.0 (L) 7.0 - 30.0 mg/dL    {Labs (ZOXWRUEA):54098}  The 10-year ASCVD risk score (Arnett DK, et al., 2019) is: 32.6%    Assessment & Plan:   Problem List Items Addressed This Visit   None   No follow-ups on file.    Margarie Shay, NP

## 2024-02-18 NOTE — ED Provider Notes (Signed)
 Emergency Medicine Observation Re-evaluation Note  Bryan Huffman is a 53 y.o. male, seen on rounds today.  Pt initially presented to the ED for complaints of Alcohol  Problem Currently, the patient is resting.  Physical Exam  BP 139/88   Pulse (!) 114   Temp 97.9 F (36.6 C) (Oral)   Resp 20   Ht 5\' 6"  (1.676 m)   Wt 113.4 kg   SpO2 98%   BMI 40.35 kg/m  Physical Exam General: NAD  ED Course / MDM  EKG:   I have reviewed the labs performed to date as well as medications administered while in observation.  Recent changes in the last 24 hours include no acute events reported.  Plan  Current plan is for placement.    Burnette Carte, MD 02/18/24 [phone]

## 2024-02-18 NOTE — Progress Notes (Signed)
 Pt has been accepted to [facility] on 02/18/2024 Bed assignment: 400-2  Pt meets inpatient criteria per: Arvell Latin NP  Attending Physician will be: Dr. Zouev MD   Report can be called ZO:[id number]: Adult unit: [phone]  Pt can arrive after Shift change tonight [facility] will update   Care Team Notified: [facility] Kathryn Parish RN, Arvell Latin NP, April Wilson paramedic, Jacquelyn Blue RN  [place] Boyce Keltner LCSW-A   02/18/2024 3:43 PM

## 2024-02-18 NOTE — ED Notes (Signed)
 Pt slept on and off throughout the night with CPAP machine in place. Pt up and down to go to the bathroom. Denies discomfort at this time, remains calm and cooperative.

## 2024-02-19 ENCOUNTER — Encounter (HOSPITAL_COMMUNITY): Payer: Self-pay | Admitting: Nurse Practitioner

## 2024-02-19 DIAGNOSIS — F17210 Nicotine dependence, cigarettes, uncomplicated: Secondary | ICD-10-CM | POA: Diagnosis not present

## 2024-02-19 DIAGNOSIS — R45851 Suicidal ideations: Secondary | ICD-10-CM | POA: Diagnosis not present

## 2024-02-19 DIAGNOSIS — E876 Hypokalemia: Secondary | ICD-10-CM | POA: Diagnosis not present

## 2024-02-19 DIAGNOSIS — F411 Generalized anxiety disorder: Secondary | ICD-10-CM | POA: Diagnosis not present

## 2024-02-19 DIAGNOSIS — G8929 Other chronic pain: Secondary | ICD-10-CM | POA: Diagnosis not present

## 2024-02-19 DIAGNOSIS — F332 Major depressive disorder, recurrent severe without psychotic features: Secondary | ICD-10-CM | POA: Diagnosis not present

## 2024-02-19 DIAGNOSIS — J45909 Unspecified asthma, uncomplicated: Secondary | ICD-10-CM | POA: Diagnosis not present

## 2024-02-19 DIAGNOSIS — I10 Essential (primary) hypertension: Secondary | ICD-10-CM | POA: Diagnosis not present

## 2024-02-19 DIAGNOSIS — E119 Type 2 diabetes mellitus without complications: Secondary | ICD-10-CM | POA: Diagnosis not present

## 2024-02-19 LAB — GLUCOSE, CAPILLARY
Glucose-Capillary: 150 mg/dL — ABNORMAL HIGH (ref 70–99)
Glucose-Capillary: 107 mg/dL — ABNORMAL HIGH (ref 70–99)

## 2024-02-19 MED ORDER — INSULIN ASPART 100 UNIT/ML IJ SOLN: 0.0000 [IU] | Freq: Three times a day (TID) | INTRAMUSCULAR | Status: DC

## 2024-02-19 MED ORDER — IBUPROFEN 600 MG PO TABS
600.0000 mg | ORAL_TABLET | Freq: Four times a day (QID) | ORAL | Status: DC | PRN
  Administered 2024-02-19 – 2024-02-24 (×13): 600 mg via ORAL
  Filled 2024-02-19 (×14): qty 1

## 2024-02-19 MED ORDER — QUETIAPINE FUMARATE 100 MG PO TABS
100.0000 mg | ORAL_TABLET | Freq: Every day | ORAL | Status: DC
  Administered 2024-02-19 – 2024-02-21 (×3): 100 mg via ORAL
  Filled 2024-02-19 (×5): qty 1

## 2024-02-19 MED ORDER — INSULIN ASPART 100 UNIT/ML IJ SOLN
0.0000 [IU] | Freq: Every day | INTRAMUSCULAR | Status: DC
Start: 2024-02-19 — End: 2024-02-23

## 2024-02-19 MED ORDER — NICOTINE 14 MG/24HR TD PT24
14.0000 mg | MEDICATED_PATCH | Freq: Every day | TRANSDERMAL | Status: DC
  Administered 2024-02-19: 14 mg via TRANSDERMAL
  Filled 2024-02-19 (×7): qty 1

## 2024-02-19 MED ORDER — VENLAFAXINE HCL ER 37.5 MG PO CP24
37.5000 mg | ORAL_CAPSULE | Freq: Every day | ORAL | Status: DC
  Administered 2024-02-19 – 2024-02-22 (×4): 37.5 mg via ORAL
  Filled 2024-02-19 (×5): qty 1

## 2024-02-19 MED ORDER — PANTOPRAZOLE SODIUM 40 MG PO TBEC
40.0000 mg | DELAYED_RELEASE_TABLET | Freq: Every day | ORAL | Status: DC
  Administered 2024-02-19 – 2024-02-21 (×3): 40 mg via ORAL
  Filled 2024-02-19 (×5): qty 1

## 2024-02-19 NOTE — H&P (Signed)
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 -Continue Metoprolol  100 mg daily   Diabetes: -Continue Metformin  500 mg BID -Start SSI   -Continue PRN's: Maalox, Advil   Observation Level/Precautions:  15 minute checks  Laboratory:  CMP: WNL except CO2: 19,  Anion GAP: 18,  AST: 72,  CBC: WNL except  HGB: 12.7, HCT: 38.5, RDW: 20.1,  EtOH: 246,  UDS: Cocaine, Benzo, THC positive,  EKG:  A1c from 4/4: 6.9 Lipid Panel, TSH, CMP ordered  Psychotherapy:    Medications:  Effexor   Consultations:    Discharge Concerns:    Estimated LOS: 5-7 days  Other:     Physician Treatment Plan for Primary Diagnosis: Alcohol  use disorder, severe, dependence (HCC) Long Term Goal(s): Improvement in symptoms so as ready for discharge  Short Term  Goals: Ability to identify changes in lifestyle to reduce recurrence of condition  will improve, Ability to verbalize feelings will improve, Ability to demonstrate self-control will improve, Ability to identify and develop effective coping behaviors will improve, Compliance with prescribed medications will improve, and Ability to identify triggers associated with substance abuse/mental health issues will improve  Physician Treatment Plan for Secondary Diagnosis: Principal Problem:   Alcohol  use disorder, severe, dependence (HCC) Active Problems:   MDD (major depressive disorder), recurrent severe, without psychosis (HCC)   Polysubstance abuse (HCC)  Long Term Goal(s): Improvement in symptoms so as ready for discharge  Short Term Goals: Ability to identify changes in lifestyle to reduce recurrence of condition will improve, Ability to verbalize feelings will improve, Ability to demonstrate self-control will improve, Ability to identify and develop effective coping behaviors will improve, Compliance with prescribed medications will improve, and Ability to identify triggers associated with substance abuse/mental health issues will improve  I certify that inpatient services furnished can reasonably be expected to improve the patient's condition.    Basilia Bosworth, MD 5/13/20251:53 PM

## 2024-02-19 NOTE — Tx Team (Signed)
 Initial Treatment Plan 02/19/2024 2:15 AM Bryan Huffman BJY:[id number]    PATIENT STRESSORS: Financial difficulties   Health problems   Legal issue   Medication change or noncompliance   Substance abuse     PATIENT STRENGTHS: Capable of independent living  General fund of knowledge  Motivation for treatment/growth  Supportive family/friends    PATIENT IDENTIFIED PROBLEMS: Risk for SI  SA  ETOH  depression  "Get better, get off booze and cigarettes"              DISCHARGE CRITERIA:  Improved stabilization in mood, thinking, and/or behavior Verbal commitment to aftercare and medication compliance  PRELIMINARY DISCHARGE PLAN: Attend aftercare/continuing care group Attend PHP/IOP Attend 12-step recovery group  PATIENT/FAMILY INVOLVEMENT: This treatment plan has been presented to and reviewed with the patient, Bryan Huffman.  The patient and family have been given the opportunity to ask questions and make suggestions.  Silvio Dry, RN 02/19/2024, 2:15 AM

## 2024-02-19 NOTE — Progress Notes (Signed)
 Pt Bp elevated this morning, pt given his morning Bp medications, will evaluate

## 2024-02-19 NOTE — [facility] Counselor (Signed)
 Adult Comprehensive Assessment  Patient ID: Bryan Huffman, male   DOB: 1971/04/18, 53 y.o.   MRN: [id number]  Information Source: Information source: Patient  Current Stressors:  Patient states their primary concerns and needs for treatment are:: "I have been drinking and drugging too much" Patient states their goals for this hospitilization and ongoing recovery are:: "Give up the booze, drugs and cigarettes" Educational / Learning stressors: None reported Employment / Job issues: None reported Family Relationships: "A little bitEngineer, petroleum / Lack of resources (include bankruptcy): "I struggle with money the most" Housing / Lack of housing: None reported Physical health (include injuries & life threatening diseases): None reported Social relationships: None reported Substance abuse: "Yeah drinking and drugs" Bereavement / Loss: "My brother passed away in 2015-03-10 and his birthday was last week that's what set me off"  Living/Environment/Situation:  Living Arrangements: Alone Living conditions (as described by patient or guardian): House Who else lives in the home?: Alone How long has patient lived in current situation?: 5 years What is atmosphere in current home: Comfortable  Family History:  Marital status: Divorced Divorced, when?: 09-Mar-2009 What types of issues is patient dealing with in the relationship?: None reported Are you sexually active?: No What is your sexual orientation?: Straight Has your sexual activity been affected by drugs, alcohol , medication, or emotional stress?: "I don't think so" Does patient have children?: Yes How many children?: 1 How is patient's relationship with their children?: Good - 80 year old son  Childhood History:  By whom was/is the patient raised?: Both parents Description of patient's relationship with caregiver when they were a child: "It was really good" Patient's description of current relationship with people who raised him/her: "It's still  good" How were you disciplined when you got in trouble as a child/adolescent?: UTA Does patient have siblings?: Yes Number of Siblings: 1 Description of patient's current relationship with siblings: 1 younger brother who passed away in Mar 10, 2015 Did patient suffer any verbal/emotional/physical/sexual abuse as a child?: No Did patient suffer from severe childhood neglect?: No Has patient ever been sexually abused/assaulted/raped as an adolescent or adult?: No Was the patient ever a victim of a crime or a disaster?: No Witnessed domestic violence?: No Has patient been affected by domestic violence as an adult?: No  Education:  Highest grade of school patient has completed: 1.5 years of tech school Currently a student?: No Learning disability?: No  Employment/Work Situation:   Employment Situation: On disability Why is Patient on Disability: Medical How Long has Patient Been on Disability: 2 years Patient's Job has Been Impacted by Current Illness: No What is the Longest Time Patient has Held a Job?: 74yrs 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?: No  Financial Resources:   Surveyor, quantity resources: [employer], [employer] ([employer] from [employer] monthly) Does patient have a Lawyer or guardian?: No  Alcohol /Substance Abuse:   What has been your use of drugs/alcohol  within the last 12 months?: "I mainly drink, smoke a little pot, sometimes do coke. I drink booze and smoke cigarettes. I would be hammered by 9 in the morning, every day except on Sundays I had to wait until lunch time" If attempted suicide, did drugs/alcohol  play a role in this?: No Alcohol /Substance Abuse Treatment Hx: Past Tx, Inpatient If yes, describe treatment: Substance use in [place], [facility], [facility]3x, [facility] Has alcohol /substance abuse ever caused legal problems?: Yes (DWI)  Social Support System:  Patient's Community Support  System: Production assistant, radio System: Parents, son, certain friends Type of faith/religion: None How does patient's faith help to cope with current illness?: "I just pray every day"  Leisure/Recreation:   Do You Have Hobbies?: Yes Leisure and Hobbies: Music, motorcycles  Strengths/Needs:   What is the patient's perception of their strengths?: None Patient states they can use these personal strengths during their treatment to contribute to their recovery: None Patient states these barriers may affect/interfere with their treatment: None Patient states these barriers may affect their return to the community: None  Discharge Plan:   Currently receiving community mental health services: No Patient states concerns and preferences for aftercare planning are: No therapist, no psychiatrist - open to appts at discharge Patient states they will know when they are safe and ready for discharge when: "I have to get off the drugs and booze" Does patient have access to transportation?: Yes Does patient have financial barriers related to discharge medications?: No Will patient be returning to same living situation after discharge?: Yes  Summary/Recommendations:   Summary and Recommendations (to be completed by the evaluator): Bryan Huffman is a 53yo male who is voluntarily admitted to [facility] secondary to [facility] due to suicidal thoughts. Stressors include brother's birthday (passed away years ago), substance abuse, and finances. Endorses alcohol  use daily, being drunk by 9:00AM daily, cocaine use, marijuana use, and cigarrettes. Pt gets SSI but feels it is not enough to pay his bills and "stay afloat." Brother was only sibling and pt has trouble coping with grief during major holidays and his brothers birthday. Has been to multiple substance use residential facilities in the past, not interested in residential at discharge. Divorced, has 1 child who is almost 47 years old, and a cat at home. Stable  housing. Does not follow up with therapy or medication management but open to appointments at discharge. Denies AVH, SI and HI. While here, [facility] can benefit from crisis stabilization, medication management, therapeutic milieu, and referrals for services.   Vonzell Guerin. 02/19/2024

## 2024-02-19 NOTE — Group Note (Signed)
 Recreation Therapy Group Note   Group Topic:Animal Assisted Therapy   Group Date: 02/19/2024 Start Time: 0945 End Time: 1030 Facilitators: Kindall Swaby-McCall, LRT,CTRS Location: [address] Dayroom   Animal-Assisted Activity (AAA) Program Checklist/Progress Notes Patient Eligibility Criteria Checklist & Daily Group note for Rec Tx Intervention  AAA/T Program Assumption of Risk Form signed by Patient/ or Parent Legal Guardian Yes  Patient is free of allergies or severe asthma Yes  Patient reports no fear of animals Yes  Patient reports no history of cruelty to animals Yes  Patient understands his/her participation is voluntary Yes  Patient washes hands before animal contact Yes  Patient washes hands after animal contact Yes  Education: Hand Washing, Appropriate Animal Interaction   Education Outcome: Acknowledges education.    Affect/Mood: N/A   Participation Level: Did not attend    Clinical Observations/Individualized Feedback:     Plan: Continue to engage patient in RT group sessions 2-3x/week.   Bryan Huffman, LRT,CTRS 02/19/2024 12:56 PM

## 2024-02-19 NOTE — [facility] Group Notes (Signed)
 Psychoeducational Group Note  Date:  02/19/2024 Time:  2000  Group Topic/Focus:  Wrap up group  Participation Level: Did Not Attend  Participation Quality:  Not Applicable  Affect:  Not Applicable  Cognitive:  Not Applicable  Insight:  Not Applicable  Engagement in Group: Not Applicable  Additional Comments:  Did not attend.   Catharine Clock 02/19/2024, 9:34 PM

## 2024-02-19 NOTE — Group Note (Signed)
 Date:  02/19/2024 Time:  8:36 AM  Group Topic/Focus:  Goals Group:   The focus of this group is to help patients establish daily goals to achieve during treatment and discuss how the patient can incorporate goal setting into their daily lives to aide in recovery.    Participation Level:  Did Not Attend   Bryan Huffman 02/19/2024, 8:36 AM

## 2024-02-19 NOTE — Plan of Care (Signed)
   Problem: Education: Goal: Emotional status will improve Outcome: Progressing   Problem: Coping: Goal: Coping ability will improve Outcome: Progressing

## 2024-02-19 NOTE — Plan of Care (Signed)
  Problem: Education: Goal: Knowledge of [facility] Education information/materials will improve Outcome: Progressing Goal: Emotional status will improve Outcome: Progressing Goal: Mental status will improve Outcome: Progressing Goal: Verbalization of understanding the information provided will improve Outcome: Progressing   Problem: Activity: Goal: Interest or engagement in activities will improve Outcome: Progressing Goal: Sleeping patterns will improve Outcome: Progressing   Problem: Coping: Goal: Ability to verbalize frustrations and anger appropriately will improve Outcome: Progressing Goal: Ability to demonstrate self-control will improve Outcome: Progressing   Problem: Health Behavior/Discharge Planning: Goal: Identification of resources available to assist in meeting health care needs will improve Outcome: Progressing Goal: Compliance with treatment plan for underlying cause of condition will improve Outcome: Progressing   Problem: Physical Regulation: Goal: Ability to maintain clinical measurements within normal limits will improve Outcome: Progressing   Problem: Safety: Goal: Periods of time without injury will increase Outcome: Progressing   Problem: Education: Goal: Knowledge of [facility] Education information/materials will improve Outcome: Progressing Goal: Emotional status will improve Outcome: Progressing Goal: Mental status will improve Outcome: Progressing Goal: Verbalization of understanding the information provided will improve Outcome: Progressing   Problem: Education: Goal: Ability to make informed decisions regarding treatment will improve Outcome: Progressing   Problem: Coping: Goal: Coping ability will improve Outcome: Progressing   Problem: Health Behavior/Discharge Planning: Goal: Identification of resources available to assist in meeting health care needs will improve Outcome: Progressing   Problem:  Medication: Goal: Compliance with prescribed medication regimen will improve Outcome: Progressing   Problem: Self-Concept: Goal: Ability to disclose and discuss suicidal ideas will improve Outcome: Progressing Goal: Will verbalize positive feelings about self Outcome: Progressing Note: Patient is initiating therapy. Patient will work on increased adherence

## 2024-02-19 NOTE — Progress Notes (Signed)
 Pt in bed, presents anxious. Pt states he did not sleep and is requesting his cpap so he can continue to rest in bed this morning. [place] 5. Ativan  administered per physician order. Pt expresses concern over upcoming court date. Social work informed.

## 2024-02-19 NOTE — Group Note (Signed)
 LCSW Group Therapy Note   Group Date: 02/19/2024 Start Time: 1100 End Time: 1200   Participation:  did not attend  Type of Therapy:  Group Therapy  Topic:  Stress Less:  Nurturing Your Mind and Body Through Calm    Objective:  Learn techniques for managing stress through body relaxation, mindfulness, and self-compassion. Goals: Use body relaxation techniques, such as Box Breathing and Progressive Muscle Relaxation, to reduce physical tension. Practice mindfulness to break the cycle of overthinking and mental chatter. Embrace self-compassion to handle stress with kindness and resilience.  Summary:  Today's session focused on calming the body with relaxation techniques, breaking the cycle of stress with mindfulness, and using self-compassion to manage challenges more gracefully. These tools help reduce stress and foster a balanced, peaceful mindset.  Therapeutic Modalities used:  Elements of CBT ( cognitive restructuring)  Elements of DBT (box breathing, progressive body relaxation, mindfulness, acceptance)    Bryan Huffman O Bryan Huffman, LCSWA 02/19/2024  12:15 PM
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
## 2024-02-19 NOTE — Progress Notes (Signed)
 Patient ID: Bryan Huffman, male   DOB: 28-Mar-1971, 53 y.o.   MRN: [id number]  Admission Note:  D:52 yr male who presents [facility] in no acute distress for the treatment of  ETOH, SI, SA and Depression. Pt appears flat and depressed. Pt was calm and cooperative with admission process. Pt stated he was drunk and passed out and his parents called police and he was taken to ED. Pt stated this is around the anniversary of his brother's death March 11, 2015 and he may have made some statements like "I wouldn't mind if I weren't around anymore" , but pt stated he did not mean it . Pt stated he has court this Wed for DWI and the social worker needs to get in contact with his Arts administrator . Pt uses a CPAP machine at night that he brought from home.   Per Assessment: divorced male who presents unaccompanied via [facility] requesting treatment for alcohol  use. Pt says he contacted his parents and asked them to take him to the hospital and they call emergency services. Pt states he was extremely intoxicated, that he drank a half gallon of liquor in the past 24 hours and knows he needs to stop drinking. He says he blacked out. He says he typically drinks 1 pint - a half gallon of liquor daily or drinks 5 tall cans of 12% malt liquor daily. He reports smoking marijuana once per week, with last use yesterday. He states he either smokes or snorts $20-40 worth of cocaine "once in a blue moon" and used $40 worth three days ago. Pt's alcohol  level is 246 and drug screen is positive for cocaine, benzodiazepines, and marijuana. He denies withdrawal symptoms and appears to have not stopped drink long enough to experience withdrawal. He says his longest period of sobriety was 8 months in March 10, 2017 when he was in jail and also receiving treatment through [facility].    Pt describes his mood as "bad", clarifying he feels depressed and anxious. He says his brother died 2015-03-01 and he told his parents he wanted to be with his brother "and  they took it the wrong way." Pt endorses passive suicidal thoughts with no current plan or intent to harm himself. He acknowledges one previous suicide attempt years ago by overdosing on "a bottle of my blood pressure medication." He acknowledges symptoms including social withdrawal, loss of interest in usual pleasures, fatigue, irritability, decreased concentration, decreased sleep, decreased appetite and feelings of guilt, worthlessness and hopelessness. He says he cannot sleep unless he drinks alcohol . He says he has severe anxiety and panic attacks, adding that he recently went to an emergency department due to a panic attack.  He says he receives $700 a month in SSI and cannot afford to live. He states he has already spent this month's available money on alcohol  and drugs. He has been charged with a DWI and has a court date this week. He says he has multiple medical problems including HTN, diabetes, degenerative disk disease, sleep apnea, and a history of multiple surgeries. He lives alone with his cat. Pt identifies his parents, his 14 year old son, and a couple of friends as his primary supports.  A: Skin was assessed and found to be clear of any abnormal marks apart from abrasions: both toes, L-knee, R-shin, L-forearm, pt has multiple tattoos bilateral arms. PT searched and no contraband found, POC and unit policies explained and understanding verbalized. Consents obtained. Food and fluids offered, and fluids accepted.   R:Pt had no  additional questions or concerns.

## 2024-02-19 NOTE — Progress Notes (Signed)
  Tod A Cottrill   Type of Note: Court letters  Letters have been scanned to [facility] for pt's court dates tomorrow morning at 8:15 AM per patient's request.  Signed:  Rolen Conger, LCSW-A 02/19/2024  11:05 AM

## 2024-02-19 NOTE — Progress Notes (Signed)
 Pt EKG was ordered for this morning , pt stated he did not want to do it right now, but stated he would do it later this morning.

## 2024-02-20 ENCOUNTER — Encounter (HOSPITAL_COMMUNITY): Payer: Self-pay

## 2024-02-20 LAB — COMPREHENSIVE METABOLIC PANEL WITH GFR
ALT: 116 U/L — ABNORMAL HIGH (ref 0–44)
AST: 169 U/L — ABNORMAL HIGH (ref 15–41)
Albumin: 3.9 g/dL (ref 3.5–5.0)
Alkaline Phosphatase: 70 U/L (ref 38–126)
Anion gap: 8 (ref 5–15)
BUN: 15 mg/dL (ref 6–20)
CO2: 25 mmol/L (ref 22–32)
Calcium: 9.8 mg/dL (ref 8.9–10.3)
Chloride: 99 mmol/L (ref 98–111)
Creatinine, Ser: 0.66 mg/dL (ref 0.61–1.24)
GFR, Estimated: >60 mL/min (ref >60)
Glucose, Bld: 122 mg/dL — ABNORMAL HIGH (ref 70–99)
Potassium: 4 mmol/L (ref 3.5–5.1)
Sodium: 132 mmol/L — ABNORMAL LOW (ref 135–145)
Total Bilirubin: 0.6 mg/dL (ref 0.0–1.2)
Total Protein: 7.9 g/dL (ref 6.5–8.1)

## 2024-02-20 LAB — TSH: TSH: 8.536 u[IU]/mL — ABNORMAL HIGH (ref 0.350–4.500)

## 2024-02-20 LAB — GLUCOSE, CAPILLARY
Glucose-Capillary: 105 mg/dL — ABNORMAL HIGH (ref 70–99)
Glucose-Capillary: 105 mg/dL — ABNORMAL HIGH (ref 70–99)
Glucose-Capillary: 119 mg/dL — ABNORMAL HIGH (ref 70–99)
Glucose-Capillary: 125 mg/dL — ABNORMAL HIGH (ref 70–99)

## 2024-02-20 LAB — LIPID PANEL
Cholesterol: 232 mg/dL — ABNORMAL HIGH (ref 0–200)
HDL: 51 mg/dL (ref >40)
LDL Cholesterol: 132 mg/dL — ABNORMAL HIGH (ref 0–99)
Total CHOL/HDL Ratio: 4.5 ratio
Triglycerides: 247 mg/dL — ABNORMAL HIGH (ref <150)
VLDL: 49 mg/dL — ABNORMAL HIGH (ref 0–40)

## 2024-02-20 NOTE — [facility] Suicide Risk Assessment (Addendum)
 [facility] INPATIENT:  Family/Significant Other Suicide Prevention Education  Suicide Prevention Education:  Contact Attempts: Earlie Fredericks (dad) [phone], (name of family member/significant other) has been identified by the patient as the family member/significant other with whom the patient will be residing, and identified as the person(s) who will aid the patient in the event of a mental health crisis.    Date and time of first attempt:  02/20/2024 at 1:45 PM  CSW attempted to call twice.  Automatic messages said,   "The call can't be completed as dialed" and  'I'm sorry, but the person you have tried to call has a voicemail box that hasn't been set up yet.  Please try your call again later."   Bryan Huffman 02/20/2024, 1:48 PM

## 2024-02-20 NOTE — BH IP Treatment Plan (Signed)
 Interdisciplinary Treatment and Diagnostic Plan Update  02/20/2024 Time of Session: 1005AM Bryan Huffman MRN: [id number]  Principal Diagnosis: Alcohol  use disorder, severe, dependence (HCC)  Secondary Diagnoses: Principal Problem:   Alcohol  use disorder, severe, dependence (HCC) Active Problems:   MDD (major depressive disorder), recurrent severe, without psychosis (HCC)   Polysubstance abuse (HCC)   Current Medications:  Current Facility-Administered Medications  Medication Dose Route Frequency Provider Last Rate Last Admin   albuterol  (VENTOLIN  HFA) 108 (90 Base) MCG/ACT inhaler 1 puff  1 puff Inhalation Q6H PRN Michel Agreste, Josephine C, NP       alum & mag hydroxide-simeth (MAALOX/MYLANTA) 200-200-20 MG/5ML suspension 30 mL  30 mL Oral Q4H PRN Michel Agreste, Josephine C, NP       amLODipine  (NORVASC ) tablet 5 mg  5 mg Oral Daily Onuoha, Josephine C, NP   5 mg at 02/20/24 9147   haloperidol (HALDOL) tablet 5 mg  5 mg Oral TID PRN Onuoha, Josephine C, NP       And   diphenhydrAMINE  (BENADRYL ) capsule 50 mg  50 mg Oral TID PRN Onuoha, Josephine C, NP       haloperidol lactate (HALDOL) injection 5 mg  5 mg Intramuscular TID PRN Onuoha, Josephine C, NP       And   diphenhydrAMINE  (BENADRYL ) injection 50 mg  50 mg Intramuscular TID PRN Onuoha, Josephine C, NP       And   LORazepam  (ATIVAN ) injection 2 mg  2 mg Intramuscular TID PRN Onuoha, Josephine C, NP       haloperidol lactate (HALDOL) injection 10 mg  10 mg Intramuscular TID PRN Onuoha, Josephine C, NP       And   diphenhydrAMINE  (BENADRYL ) injection 50 mg  50 mg Intramuscular TID PRN Onuoha, Josephine C, NP       And   LORazepam  (ATIVAN ) injection 2 mg  2 mg Intramuscular TID PRN Onuoha, Josephine C, NP       fluticasone furoate-vilanterol (BREO ELLIPTA) 200-25 MCG/ACT 1 puff  1 puff Inhalation Daily Onuoha, Josephine C, NP   1 puff at 02/20/24 0803   folic acid  (FOLVITE ) tablet 1 mg  1 mg Oral Daily Onuoha, Josephine C, NP   1 mg at  02/20/24 8295   ibuprofen  (ADVIL ) tablet 600 mg  600 mg Oral Q6H PRN Pashayan, Alexander S, MD   600 mg at 02/19/24 1716   insulin  aspart (novoLOG ) injection 0-15 Units  0-15 Units Subcutaneous TID WC Pashayan, Knute Perla, MD       insulin  aspart (novoLOG ) injection 0-5 Units  0-5 Units Subcutaneous QHS Pashayan, Alexander S, MD       lisinopril  (ZESTRIL ) tablet 10 mg  10 mg Oral Daily Onuoha, Josephine C, NP   10 mg at 02/20/24 0802   LORazepam  (ATIVAN ) tablet 1-4 mg  1-4 mg Oral Q1H PRN Onuoha, Josephine C, NP   1 mg at 02/19/24 1715   Or   LORazepam  (ATIVAN ) injection 1-4 mg  1-4 mg Intravenous Q1H PRN Onuoha, Josephine C, NP       metFORMIN  (GLUCOPHAGE -XR) 24 hr tablet 500 mg  500 mg Oral BID WC Onuoha, Josephine C, NP   500 mg at 02/20/24 6213   metoprolol  succinate (TOPROL -XL) 24 hr tablet 100 mg  100 mg Oral Daily Onuoha, Josephine C, NP   100 mg at 02/20/24 0803   multivitamin with minerals tablet 1 tablet  1 tablet Oral Daily Onuoha, Josephine C, NP   1 tablet at 02/20/24 0802  nicotine  (NICODERM CQ  - dosed in mg/24 hours) patch 14 mg  14 mg Transdermal Daily Onuoha, Chinwendu V, NP   14 mg at 02/19/24 6045   pantoprazole  (PROTONIX ) EC tablet 40 mg  40 mg Oral Daily Attiah, Nadir, MD   40 mg at 02/20/24 0803   QUEtiapine  (SEROQUEL ) tablet 100 mg  100 mg Oral QHS Pashayan, Alexander S, MD   100 mg at 02/19/24 2141   thiamine  (Vitamin B-1) tablet 100 mg  100 mg Oral Daily Onuoha, Josephine C, NP   100 mg at 02/20/24 4098   Or   thiamine  (VITAMIN B1) injection 100 mg  100 mg Intravenous Daily Onuoha, Josephine C, NP       venlafaxine  XR (EFFEXOR -XR) 24 hr capsule 37.5 mg  37.5 mg Oral Q breakfast Pashayan, Alexander S, MD   37.5 mg at 02/20/24 0803   PTA Medications: Medications Prior to Admission  Medication Sig Dispense Refill Last Dose/Taking   albuterol  (VENTOLIN  HFA) 108 (90 Base) MCG/ACT inhaler TAKE 2 PUFFS BY MOUTH EVERY 6 HOURS AS NEEDED FOR WHEEZE OR SHORTNESS OF BREATH 18  each 1    amLODipine  (NORVASC ) 5 MG tablet Take 1 tablet (5 mg total) by mouth daily. 90 tablet 1    budesonide -formoterol  (SYMBICORT ) 160-4.5 MCG/ACT inhaler Inhale 2 puffs into the lungs 2 (two) times daily. 1 each 3    busPIRone  (BUSPAR ) 10 MG tablet Take 10 mg by mouth 3 (three) times daily. (Patient not taking: Reported on 02/18/2024)      dexlansoprazole  (DEXILANT ) 60 MG capsule Take 1 capsule (60 mg total) by mouth daily. 90 capsule 1    ibuprofen  (ADVIL ) 200 MG tablet Take 800 mg by mouth as needed for moderate pain (pain score 4-6) or mild pain (pain score 1-3).      Icosapent  Ethyl (VASCEPA ) 0.5 g CAPS Take 2 capsules (1 g total) by mouth 2 (two) times daily. (Patient taking differently: Take 1 g by mouth daily.) 240 capsule 0    lisinopril  (ZESTRIL ) 40 MG tablet Take 40 mg by mouth daily.      LORazepam  (ATIVAN ) 1 MG tablet Take 1 mg by mouth every 8 (eight) hours as needed for anxiety.      metFORMIN  (GLUCOPHAGE -XR) 500 MG 24 hr tablet Take 1 tablet (500 mg total) by mouth 2 (two) times daily with a meal. 60 tablet 2    metoprolol  succinate (TOPROL -XL) 100 MG 24 hr tablet Take 1 tablet (100 mg total) by mouth daily. Take with or immediately following a meal. 90 tablet 1    QUEtiapine  (SEROQUEL ) 100 MG tablet Take 1 tablet (100 mg total) by mouth at bedtime. 30 tablet 0    testosterone  cypionate (DEPOTESTOSTERONE CYPIONATE) 200 MG/ML injection Inject 100 mg into the muscle every 14 (fourteen) days. (Patient not taking: Reported on 02/18/2024)       Patient Stressors: Financial difficulties   Health problems   Legal issue   Medication change or noncompliance   Substance abuse    Patient Strengths: Capable of independent living  General fund of knowledge  Motivation for treatment/growth  Supportive family/friends   Treatment Modalities: Medication Management, Group therapy, Case management,  1 to 1 session with clinician, Psychoeducation, Recreational therapy.   Physician  Treatment Plan for Primary Diagnosis: Alcohol  use disorder, severe, dependence (HCC) Long Term Goal(s): Improvement in symptoms so as ready for discharge   Short Term Goals: Ability to identify changes in lifestyle to reduce recurrence of condition will improve Ability to  verbalize feelings will improve Ability to demonstrate self-control will improve Ability to identify and develop effective coping behaviors will improve Compliance with prescribed medications will improve Ability to identify triggers associated with substance abuse/mental health issues will improve  Medication Management: Evaluate patient's response, side effects, and tolerance of medication regimen.  Therapeutic Interventions: 1 to 1 sessions, Unit Group sessions and Medication administration.  Evaluation of Outcomes: Not Progressing  Physician Treatment Plan for Secondary Diagnosis: Principal Problem:   Alcohol  use disorder, severe, dependence (HCC) Active Problems:   MDD (major depressive disorder), recurrent severe, without psychosis (HCC)   Polysubstance abuse (HCC)  Long Term Goal(s): Improvement in symptoms so as ready for discharge   Short Term Goals: Ability to identify changes in lifestyle to reduce recurrence of condition will improve Ability to verbalize feelings will improve Ability to demonstrate self-control will improve Ability to identify and develop effective coping behaviors will improve Compliance with prescribed medications will improve Ability to identify triggers associated with substance abuse/mental health issues will improve     Medication Management: Evaluate patient's response, side effects, and tolerance of medication regimen.  Therapeutic Interventions: 1 to 1 sessions, Unit Group sessions and Medication administration.  Evaluation of Outcomes: Not Progressing   RN Treatment Plan for Primary Diagnosis: Alcohol  use disorder, severe, dependence (HCC) Long Term Goal(s): Knowledge of  disease and therapeutic regimen to maintain health will improve  Short Term Goals: Ability to remain free from injury will improve, Ability to verbalize frustration and anger appropriately will improve, Ability to demonstrate self-control, Ability to participate in decision making will improve, Ability to verbalize feelings will improve, Ability to disclose and discuss suicidal ideas, Ability to identify and develop effective coping behaviors will improve, and Compliance with prescribed medications will improve  Medication Management: RN will administer medications as ordered by provider, will assess and evaluate patient's response and provide education to patient for prescribed medication. RN will report any adverse and/or side effects to prescribing provider.  Therapeutic Interventions: 1 on 1 counseling sessions, Psychoeducation, Medication administration, Evaluate responses to treatment, Monitor vital signs and CBGs as ordered, Perform/monitor CIWA, COWS, AIMS and Fall Risk screenings as ordered, Perform wound care treatments as ordered.  Evaluation of Outcomes: Not Progressing   LCSW Treatment Plan for Primary Diagnosis: Alcohol  use disorder, severe, dependence (HCC) Long Term Goal(s): Safe transition to appropriate next level of care at discharge, Engage patient in therapeutic group addressing interpersonal concerns.  Short Term Goals: Engage patient in aftercare planning with referrals and resources, Increase social support, Increase ability to appropriately verbalize feelings, Increase emotional regulation, Facilitate acceptance of mental health diagnosis and concerns, Facilitate patient progression through stages of change regarding substance use diagnoses and concerns, Identify triggers associated with mental health/substance abuse issues, and Increase skills for wellness and recovery  Therapeutic Interventions: Assess for all discharge needs, 1 to 1 time with Social worker, Explore  available resources and support systems, Assess for adequacy in community support network, Educate family and significant other(s) on suicide prevention, Complete Psychosocial Assessment, Interpersonal group therapy.  Evaluation of Outcomes: Not Progressing   Progress in Treatment: Attending groups: No. Participating in groups: No. Taking medication as prescribed: Yes. Toleration medication: Yes. Family/Significant other contact made: No, will contact:  Sherri Balbuena (dad) [phone] Patient understands diagnosis: Yes. Discussing patient identified problems/goals with staff: Yes. Medical problems stabilized or resolved: Yes. Denies suicidal/homicidal ideation: Yes. Issues/concerns per patient self-inventory: No.  New problem(s) identified: No, Describe:  none  New Short Term/Long Term Goal(s): detox,  medication management for mood stabilization; elimination of SI thoughts; development of comprehensive mental wellness/sobriety plan   Patient Goals:  "Stop smoking cigarettes and get off the alcohol  and adjust my medications"  Discharge Plan or Barriers: Patient recently admitted. CSW will continue to follow and assess for appropriate referrals and possible discharge planning.    Reason for Continuation of Hospitalization: Depression Medication stabilization Suicidal ideation Withdrawal symptoms  Estimated Length of Stay: 5-7 days  Last 3 [place] Suicide Severity Risk Score: Flowsheet Row Ad[facility] (Current) [facility] from [facility]17/2024 in [facility] ED [facility]  C-SSRS RISK CATEGORY Moderate Risk Moderate Risk No Risk       Last [facility] 2/9 Scores:    01/11/2024   12:29 PM 07/10/2023   12:08 PM 06/27/2023    4:41 PM  Depression screen PHQ 2/9  Decreased Interest 2 1 1   Down, Depressed, Hopeless 3 1 1   PHQ - 2  Score 5 2 2   Altered sleeping 3 1 1   Tired, decreased energy 3 1 2   Change in appetite 3 1 1   Feeling bad or failure about yourself  3 0 1  Trouble concentrating 3 0 0  Moving slowly or fidgety/restless 3 0 3  Suicidal thoughts 0 0 0  PHQ-9 Score 23 5 10   Difficult doing work/chores  Somewhat difficult Somewhat difficult    Scribe for Treatment Team: Rheta Celestine 02/20/2024 2:11 PM

## 2024-02-20 NOTE — Progress Notes (Signed)
   02/20/24 0803  Psych Admission Type (Psych Patients Only)  Admission Status Voluntary  Psychosocial Assessment  Patient Complaints Irritability  Eye Contact Fair  Facial Expression Angry  Affect Anxious  Speech Logical/coherent  Interaction Assertive  Motor Activity Slow  Appearance/Hygiene Unremarkable  Behavior Characteristics Irritable  Mood Irritable  Thought Process  Coherency WDL  Content WDL  Delusions None reported or observed  Perception WDL  Hallucination None reported or observed  Judgment Poor  Confusion None  Danger to Self  Current suicidal ideation? Denies  Agreement Not to Harm Self Yes  Description of Agreement verbal  Danger to Others  Danger to Others None reported or observed

## 2024-02-20 NOTE — Plan of Care (Signed)
  Problem: Education: Goal: Emotional status will improve Outcome: Not Progressing   Problem: Activity: Goal: Interest or engagement in activities will improve Outcome: Not Progressing

## 2024-02-20 NOTE — Plan of Care (Signed)
  Problem: Activity: Goal: Sleeping patterns will improve Outcome: Progressing   Problem: Coping: Goal: Ability to verbalize frustrations and anger appropriately will improve Outcome: Progressing   Problem: Safety: Goal: Periods of time without injury will increase Outcome: Progressing

## 2024-02-20 NOTE — Group Note (Signed)
 Date:  02/20/2024 Time:  1:59 PM  Group Topic/Focus:  Emotional Education:   The focus of this group is to discuss unhealthy thought patterns and how they impact mental health.     Participation Level:  Did Not Attend   Sheryl Donna 02/20/2024, 1:59 PM

## 2024-02-20 NOTE — Group Note (Signed)
 Recreation Therapy Group Note   Group Topic:Communication  Group Date: 02/20/2024 Start Time: 0933 End Time: 1013 Facilitators: Alric Geise-McCall, LRT,CTRS Location: [address] Dayroom   Group Topic: Communication, Problem Solving   Goal Area(s) Addresses:  Patient will effectively listen to complete activity.  Patient will identify communication skills used to make activity successful.  Patient will identify how skills used during activity can be used to reach post d/c goals.    Behavioral Response: N/A   Intervention: Building surveyor Activity - Geometric pattern cards, pencils, blank paper    Activity: Geometric Drawings.  Three volunteers from the peer group will be shown an abstract picture with a particular arrangement of geometrical shapes.  Each round, one 'speaker' will describe the pattern, as accurately as possible without revealing the image to the group.  The remaining group members will listen and draw the picture to reflect how it is described to them. Patients with the role of 'listener' cannot ask clarifying questions but, may request that the speaker repeat a direction. Once the drawings are complete, the presenter will show the rest of the group the picture and compare how close each person came to drawing the picture. LRT will facilitate a post-activity discussion regarding effective communication and the importance of planning, listening, and asking for clarification in daily interactions with others.  Education: Environmental consultant, Active listening, Support systems, Discharge planning  Education Outcome: Acknowledges understanding/In group clarification offered/Needs additional education.    Affect/Mood: N/A   Participation Level: Did not attend    Clinical Observations/Individualized Feedback:     Plan: Continue to engage patient in RT group sessions 2-3x/week.   Alvaretta Eisenberger-McCall, LRT,CTRS 02/20/2024 1:10 PM

## 2024-02-20 NOTE — Progress Notes (Signed)
   02/20/24 2300  Psych Admission Type (Psych Patients Only)  Admission Status Voluntary  Psychosocial Assessment  Patient Complaints Irritability  Eye Contact Fair  Facial Expression Anxious  Affect Anxious  Speech Logical/coherent  Interaction Assertive  Motor Activity Slow  Appearance/Hygiene Unremarkable  Behavior Characteristics Restless  Mood Anxious  Thought Process  Coherency WDL  Content WDL  Delusions None reported or observed  Perception WDL  Hallucination None reported or observed  Judgment Poor  Confusion None  Danger to Self  Current suicidal ideation? Denies  Agreement Not to Harm Self Yes  Description of Agreement Verbal  Danger to Others  Danger to Others None reported or observed

## 2024-02-20 NOTE — Progress Notes (Signed)
 [facility] MD Progress Note  02/20/2024 12:18 PM Bryan Huffman  MRN:  [id number] Subjective:   Bryan Huffman is a 53 yr old male who presented on 5/11 to [facility] due to SI and substance abuse, he was admitted to [facility] on 5/13. PPHx is significant for Depression, Anxiety, and Polysubstance Abuse (EtOH, Cocaine), 1 Suicide Attempt (OD and held gun to head- 11/2013), and ~5 Prior Psychiatric Hospitalizations ([place]  few years ago), and no history of Self Injurious Behavior.    Case was discussed in the multidisciplinary team. MAR was reviewed and patient was compliant with medications.  He received PRN Ativan  x2 for CIWA scores.  He received PRN Advil .   Psychiatric Team made the following recommendations yesterday: -Start Effexor  37.5 mg daily for depression and anxiety -Continue Seroquel  100 mg QHS for augmentation and sleep   On interview today patient reports he slept poor last night due to his roommate being up and down all night.  He reports his appetite is doing fair.  He reports no SI, HI, or AVH.  He reports no Paranoia or Ideas of Reference.  He reports no issues with his medications.  Discussed with him that we would work on getting his roommate his own room so that he would be able to get better sleep tonight.  He reports that he is hopeful this will happen because last night was very hard.  Discussed with him that we would not make any changes at this time given his continued withdrawal symptoms and he was agreeable.  He reports having upset stomach from withdrawal but reports no other concerns at present.   Principal Problem: Alcohol  use disorder, severe, dependence (HCC) Diagnosis: Principal Problem:   Alcohol  use disorder, severe, dependence (HCC) Active Problems:   MDD (major depressive disorder), recurrent severe, without psychosis (HCC)   Polysubstance abuse (HCC)  Total Time spent with patient:  I personally spent 35 minutes on the unit in direct patient care. The direct  patient care time included face-to-face time with the patient, reviewing the patient's chart, communicating with other professionals, and coordinating care. Greater than 50% of this time was spent in counseling or coordinating care with the patient regarding goals of hospitalization, psycho-education, and discharge planning needs.   Past Psychiatric History:  Depression, Anxiety, and Polysubstance Abuse (EtOH, Cocaine), 1 Suicide Attempt (OD and held gun to head- 11/2013), and ~5 Prior Psychiatric Hospitalizations ([place]  few years ago), and no history of Self Injurious Behavior.   Past Medical History:  Past Medical History:  Diagnosis Date   Anxiety    Arthritis    Asthma    Back pain    Depression    GERD (gastroesophageal reflux disease)    Heart murmur    aortic sclerosis on Echo   Hemorrhoid    Hypertension    Insomnia    OSA (obstructive sleep apnea)    wears cpap    Past Surgical History:  Procedure Laterality Date   ANTERIOR CERVICAL DECOMP/DISCECTOMY FUSION N/A 09/24/2019   Procedure: ANTERIOR CERVICAL DECOMPRESSION/DISCECTOMY FUSION C5-6;  Surgeon: Mort Ards, MD;  Location: [facility] OR;  Service: Orthopedics;  Laterality: N/A;  3.5 hrs   ANTERIOR LAT LUMBAR FUSION N/A 01/29/2014   Procedure: X LIF L2-L3 WITH LATERAL PLATES -ANTERIOR LATERAL LUMBAR FUSION 1 LEVEL;  Surgeon: Mort Ards, MD;  Location: [facility] OR;  Service: Orthopedics;  Laterality: N/A;   BACK SURGERY  1998,2008   L3 L4 L5 fusion   5 total on spine   HEMORRHOID  SURGERY     I & D EXTREMITY Right 09/07/2014   Procedure: DEBRIDEMENT RIGHT HAND EXTENSOR TENOSYNOVECTOMY;  Surgeon: Sheryl Donna, MD;  Location: [facility];  Service: Orthopedics;  Laterality: Right;   I & D EXTREMITY Right 12/14/2014   Procedure: IRRIGATION AND DEBRIDEMENT EXTREMITY RIGHT HAND;  Surgeon: Rober Chimera, MD;  Location: [facility];  Service: Orthopedics;  Laterality: Right;  RRIGATION AND DEBRIDEMENT  EXTREMITY RIGHT HAND   LUMBAR LAMINECTOMY/DECOMPRESSION MICRODISCECTOMY Left 10/15/2013   Procedure: LUMBAR 2-3 LEFT DISCECTOMY;  Surgeon: Mort Ards, MD;  Location: [facility] OR;  Service: Orthopedics;  Laterality: Left;   LUMBAR SPINE SURGERY  10/15/2013   L 2 L3  DISECTOMY   NM MYOVIEW  LTD  10/2018   NORMAL study. EF > 65% (Hyperdynamic). NO ST segment changes with exercise (Neg EKG ST) with NO ISCHEMIA OR INFARCTION.   SPINE SURGERY     TONSILLECTOMY     TOTAL HIP ARTHROPLASTY Left 10/14/2020   Procedure: TOTAL HIP ARTHROPLASTY ANTERIOR APPROACH;  Surgeon: Adonica Hoose, MD;  Location: [facility];  Service: Orthopedics;  Laterality: Left;   TRANSTHORACIC ECHOCARDIOGRAM  07/2019   EF 55 to 60%.  Normal LV size and function.  No LVH.  GR 1 DD but normal LA size.  Normal RV size and function.  Trivial pericardial effusion.  Mild to moderate AOV calcification/porosis but no stenosis.  Normal mitral and tricuspid valves.  Normal right ventricular pressures and right atrial pressure.   Family History:  Family History  Problem Relation Age of Onset   Cancer Mother        breast   Hyperlipidemia Father    CAD Father 35       Had stents placed for angina.   Diabetes Father    Kidney Stones Father    Other Brother        Suspected suicide   Family Psychiatric  History:  Paternal Grandfather- EtOH Abuse Maternal Grandmother- Unknown diagnosis' Multiple Cousins Paternal Side- EtOH Abuse No Known Suicides  Social History:  Social History   Substance and Sexual Activity  Alcohol  Use Yes   Comment: 2-3 beers a week several times a week     Social History   Substance and Sexual Activity  Drug Use Yes   Types: Marijuana, Cocaine   Comment: CBD edibles    Social History   Socioeconomic History   Marital status: Divorced    Spouse name: Not on file   Number of children: 1   Years of education: Not on file   Highest education level: Not on file  Occupational History   Not on file   Tobacco Use   Smoking status: Every Day    Current packs/day: 0.50    Average packs/day: 0.5 packs/day for 18.0 years (9.0 ttl pk-yrs)    Types: Cigarettes   Smokeless tobacco: Never   Tobacco comments:    cutting back  Vaping Use   Vaping status: Every Day   Substances: Nicotine , Flavoring  Substance and Sexual Activity   Alcohol  use: Yes    Comment: 2-3 beers a week several times a week   Drug use: Yes    Types: Marijuana, Cocaine    Comment: CBD edibles   Sexual activity: Not Currently    Birth control/protection: None  Other Topics Concern   Not on file  Social History Narrative   He is now on disability because of chronic back pain.  His son does not really come around  very much and he is divorced so he does not want.   Not very active.  Trying to quit smoking.         Apparently he was turned down for recurrent disability a is trying to get additional coverage for medication coverage.  (Social Security insurance? coverage)   Social Drivers of Health   Financial Resource Strain: Not on file  Food Insecurity: Food Insecurity Present (02/18/2024)   Hunger Vital Sign    Worried About Running Out of Food in the Last Year: Often true    Ran Out of Food in the Last Year: Often true  Transportation Needs: No Transportation Needs (02/18/2024)   PRAPARE - Administrator, Civil Service (Medical): No    Lack of Transportation (Non-Medical): No  Physical Activity: Not on file  Stress: Not on file  Social Connections: Not on file   Additional Social History:                         Sleep: Poor due to roommate being up and down all night  Appetite:  Fair  Current Medications: Current Facility-Administered Medications  Medication Dose Route Frequency Provider Last Rate Last Admin   albuterol  (VENTOLIN  HFA) 108 (90 Base) MCG/ACT inhaler 1 puff  1 puff Inhalation Q6H PRN Michel Agreste, Josephine C, NP       alum & mag hydroxide-simeth (MAALOX/MYLANTA)  200-200-20 MG/5ML suspension 30 mL  30 mL Oral Q4H PRN Michel Agreste, Josephine C, NP       amLODipine  (NORVASC ) tablet 5 mg  5 mg Oral Daily Onuoha, Josephine C, NP   5 mg at 02/20/24 4098   haloperidol (HALDOL) tablet 5 mg  5 mg Oral TID PRN Onuoha, Josephine C, NP       And   diphenhydrAMINE  (BENADRYL ) capsule 50 mg  50 mg Oral TID PRN Onuoha, Josephine C, NP       haloperidol lactate (HALDOL) injection 5 mg  5 mg Intramuscular TID PRN Onuoha, Josephine C, NP       And   diphenhydrAMINE  (BENADRYL ) injection 50 mg  50 mg Intramuscular TID PRN Onuoha, Josephine C, NP       And   LORazepam  (ATIVAN ) injection 2 mg  2 mg Intramuscular TID PRN Onuoha, Josephine C, NP       haloperidol lactate (HALDOL) injection 10 mg  10 mg Intramuscular TID PRN Onuoha, Josephine C, NP       And   diphenhydrAMINE  (BENADRYL ) injection 50 mg  50 mg Intramuscular TID PRN Onuoha, Josephine C, NP       And   LORazepam  (ATIVAN ) injection 2 mg  2 mg Intramuscular TID PRN Onuoha, Josephine C, NP       fluticasone furoate-vilanterol (BREO ELLIPTA) 200-25 MCG/ACT 1 puff  1 puff Inhalation Daily Onuoha, Josephine C, NP   1 puff at 02/20/24 0803   folic acid  (FOLVITE ) tablet 1 mg  1 mg Oral Daily Onuoha, Josephine C, NP   1 mg at 02/20/24 1191   ibuprofen  (ADVIL ) tablet 600 mg  600 mg Oral Q6H PRN Tamberly Pomplun S, MD   600 mg at 02/19/24 1716   insulin  aspart (novoLOG ) injection 0-15 Units  0-15 Units Subcutaneous TID WC Wilson Dusenbery, Knute Perla, MD       insulin  aspart (novoLOG ) injection 0-5 Units  0-5 Units Subcutaneous QHS Karne Ozga S, MD       lisinopril  (ZESTRIL ) tablet 10 mg  10 mg Oral  Daily Onuoha, Josephine C, NP   10 mg at 02/20/24 0802   LORazepam  (ATIVAN ) tablet 1-4 mg  1-4 mg Oral Q1H PRN Onuoha, Josephine C, NP   1 mg at 02/19/24 1715   Or   LORazepam  (ATIVAN ) injection 1-4 mg  1-4 mg Intravenous Q1H PRN Onuoha, Josephine C, NP       metFORMIN  (GLUCOPHAGE -XR) 24 hr tablet 500 mg  500 mg Oral BID WC  Onuoha, Josephine C, NP   500 mg at 02/20/24 1610   metoprolol  succinate (TOPROL -XL) 24 hr tablet 100 mg  100 mg Oral Daily Onuoha, Josephine C, NP   100 mg at 02/20/24 0803   multivitamin with minerals tablet 1 tablet  1 tablet Oral Daily Onuoha, Josephine C, NP   1 tablet at 02/20/24 0802   nicotine  (NICODERM CQ  - dosed in mg/24 hours) patch 14 mg  14 mg Transdermal Daily Onuoha, Chinwendu V, NP   14 mg at 02/19/24 9604   pantoprazole  (PROTONIX ) EC tablet 40 mg  40 mg Oral Daily Attiah, Nadir, MD   40 mg at 02/20/24 5409   QUEtiapine  (SEROQUEL ) tablet 100 mg  100 mg Oral QHS Jesika Men S, MD   100 mg at 02/19/24 2141   thiamine  (Vitamin B-1) tablet 100 mg  100 mg Oral Daily Onuoha, Josephine C, NP   100 mg at 02/20/24 8119   Or   thiamine  (VITAMIN B1) injection 100 mg  100 mg Intravenous Daily Onuoha, Josephine C, NP       venlafaxine  XR (EFFEXOR -XR) 24 hr capsule 37.5 mg  37.5 mg Oral Q breakfast Denson Niccoli S, MD   37.5 mg at 02/20/24 1478    Lab Results:  Results for orders placed or performed during the hospital encounter of 02/18/24 (from the past 48 hours)  Glucose, capillary     Status: Abnormal   Collection Time: 02/19/24  4:58 PM  Result Value Ref Range   Glucose-Capillary 150 (H) 70 - 99 mg/dL    Comment: Glucose reference range applies only to samples taken after fasting for at least 8 hours.  Glucose, capillary     Status: Abnormal   Collection Time: 02/19/24  9:19 PM  Result Value Ref Range   Glucose-Capillary 107 (H) 70 - 99 mg/dL    Comment: Glucose reference range applies only to samples taken after fasting for at least 8 hours.  Glucose, capillary     Status: Abnormal   Collection Time: 02/20/24  5:52 AM  Result Value Ref Range   Glucose-Capillary 105 (H) 70 - 99 mg/dL    Comment: Glucose reference range applies only to samples taken after fasting for at least 8 hours.   Comment 1 Notify RN    Comment 2 Document in Chart   Glucose, capillary      Status: Abnormal   Collection Time: 02/20/24 11:54 AM  Result Value Ref Range   Glucose-Capillary 105 (H) 70 - 99 mg/dL    Comment: Glucose reference range applies only to samples taken after fasting for at least 8 hours.    Blood Alcohol  level:  Lab Results  Component Value Date   ETH 246 (H) 02/17/2024   ETH 286 (H) 08/28/2022    Metabolic Disorder Labs: Lab Results  Component Value Date   HGBA1C 6.9 (H) 01/11/2024   MPG 154.2 09/25/2019   No results found for: "PROLACTIN" Lab Results  Component Value Date   CHOL 230 (H) 06/27/2023   TRIG 274.0 (H) 06/27/2023  HDL 40.10 06/27/2023   CHOLHDL 6 06/27/2023   VLDL 54.8 (H) 06/27/2023   LDLCALC 136 (H) 06/27/2023   LDLCALC 76 07/25/2019    Physical Findings: AIMS:  , ,  ,  ,    CIWA:  CIWA-Ar Total: 2 COWS:     Musculoskeletal: Strength & Muscle Tone: within normal limits Gait & Station: laying in bed Patient leans: N/A  Psychiatric Specialty Exam:  Presentation  General Appearance:  Disheveled  Eye Contact: Fair  Speech: Clear and Coherent; Normal Rate  Speech Volume: Normal  Handedness:No data recorded  Mood and Affect  Mood: Depressed; Irritable  Affect: Congruent; Depressed; Constricted   Thought Process  Thought Processes: Coherent; Goal Directed  Descriptions of Associations:Intact  Orientation:Full (Time, Place and Person)  Thought Content:Logical; WDL  History of Schizophrenia/Schizoaffective disorder:No  Duration of Psychotic Symptoms:No data recorded Hallucinations:Hallucinations: None  Ideas of Reference:None  Suicidal Thoughts:Suicidal Thoughts: No  Homicidal Thoughts:Homicidal Thoughts: No   Sensorium  Memory: Immediate Fair  Judgment: Intact  Insight: Present   Executive Functions  Concentration: Fair  Attention Span: Fair  Recall: Fair  Fund of Knowledge: Fair  Language: Fair   Psychomotor Activity  Psychomotor Activity: Psychomotor  Activity: Normal   Assets  Assets: Communication Skills; Desire for Improvement; Resilience   Sleep  Sleep: Sleep: Poor (due to roommate) Number of Hours of Sleep: 8.75    Physical Exam: Physical Exam Vitals and nursing note reviewed.  Constitutional:      General: He is not in acute distress.    Appearance: Normal appearance. He is obese. He is not ill-appearing or toxic-appearing.  HENT:     Head: Normocephalic and atraumatic.  Pulmonary:     Effort: Pulmonary effort is normal.  Neurological:     General: No focal deficit present.     Mental Status: He is alert.    Review of Systems  Respiratory:  Negative for cough and shortness of breath.   Cardiovascular:  Negative for chest pain.  Gastrointestinal:  Positive for abdominal pain and nausea. Negative for constipation, diarrhea and vomiting.  Neurological:  Negative for dizziness, weakness and headaches.  Psychiatric/Behavioral:  Positive for depression. Negative for hallucinations and suicidal ideas. The patient is nervous/anxious.    Blood pressure (!) 164/99, pulse 70, temperature 98 F (36.7 C), temperature source Axillary, resp. rate 18, height 5\' 6"  (1.676 m), weight 115.7 kg, SpO2 99%. Body mass index is 41.16 kg/m.   Treatment Plan Summary: Daily contact with patient to assess and evaluate symptoms and progress in treatment and Medication management  Muhammed Strub is a 53 yr old male who presented on 5/11 to [facility] due to [facility] and substance abuse, he was admitted to [facility] on 5/13. PPHx is significant for Depression, Anxiety, and Polysubstance Abuse (EtOH, Cocaine), 1 Suicide Attempt (OD and held gun to head- 11/2013), and ~5 Prior Psychiatric Hospitalizations ([place]  few years ago), and no history of Self Injurious Behavior.    Nichola is irritable this morning due to his roommate being up and down all night.  He tolerated starting the Effexor  and his increase in blood pressure is most likely due to his  withdrawal symptoms.  We will not make any changes to his medications at this time.  We will continue to monitor.    MDD, Recurrent, Severe, w/out Psychosis  GAD: -Continue Effexor  37.5 mg daily for depression and anxiety -Continue Seroquel  100 mg QHS for augmentation and sleep -Continue Agitation Protocol: Haldol/Ativan /Benadryl      Withdrawal: -  Continue CIWA, last score= 2  @ [phone]  5/14 -Continue Ativan  per CIWA scale -Continue Thiamine  100 mg daily for nutritional supplementation -Continue Folic Acid  1 mg daily for nutritional supplementation -Continue Multivitamin daily for nutritional supplementation     Asthma: -Continue Albuterol  1-2 puffs q6 PRN  -Continue Breo Ellipta 200-25 1 puff daily     HTN: -Continue Amlodipine  5 mg daily -Continue Lisinopril  10 mg daily -Continue Metoprolol  100 mg daily     Diabetes: -Continue Metformin  500 mg BID -Continue SSI     -Continue PRN's: Maalox, Advil    --  The risks/benefits/side-effects/alternatives to medications were discussed in detail with the patient and time was given for questions. The patient consents to medication trials.                -- Metabolic profile and EKG monitoring obtained while on an atypical antipsychotic (BMI: Lipid Panel: HbgA1c: QTc:)              -- Encouraged patient to participate in unit milieu and in scheduled group therapies              Safety and Monitoring:             -- Voluntary admission to inpatient psychiatric unit for safety, stabilization and treatment             -- Daily contact with patient to assess and evaluate symptoms and progress in treatment             -- Patient's case to be discussed in multi-disciplinary team meeting             -- Observation Level : q15 minute checks             -- Vital signs:  q12 hours             -- Precautions: suicide, elopement, and assault  Discharge Planning:              -- Social work and case management to assist with discharge  planning and identification of hospital follow-up needs prior to discharge             -- Estimated LOS: 4-6 more days             -- Discharge Concerns: Need to establish a safety plan; Medication compliance and effectiveness             -- Discharge Goals: Return home with outpatient referrals for mental health follow-up including medication management/psychotherapy   Basilia Bosworth, MD 02/20/2024, 12:18 PM

## 2024-02-20 NOTE — Progress Notes (Signed)
   02/19/24 2200  Psychosocial Assessment  Patient Complaints Anxiety;Depression  Eye Contact Fair  Facial Expression Sad  Affect Anxious  Speech Logical/coherent  Interaction Assertive  Motor Activity Other (Comment) (WNL)  Appearance/Hygiene Unremarkable  Behavior Characteristics Cooperative  Mood Anxious;Depressed  Thought Process  Coherency WDL  Content Blaming others  Delusions None reported or observed  Perception WDL  Hallucination None reported or observed  Judgment Impaired  Confusion WDL  Danger to Self  Current suicidal ideation? Denies  Agreement Not to Harm Self Yes  Description of Agreement Verbal  Danger to Others  Danger to Others None reported or observed

## 2024-02-20 NOTE — Group Note (Signed)
 Date:  02/20/2024 Time:  1:44 PM  Group Topic/Focus:  Goals Group:   The focus of this group is to help patients establish daily goals to achieve during treatment and discuss how the patient can incorporate goal setting into their daily lives to aide in recovery. Orientation:   The focus of this group is to educate the patient on the purpose and policies of crisis stabilization and provide a format to answer questions about their admission.  The group details unit policies and expectations of patients while admitted.    Participation Level:  Did Not Attend   Sheryl Donna 02/20/2024, 1:44 PM

## 2024-02-21 LAB — GLUCOSE, CAPILLARY
Glucose-Capillary: 106 mg/dL — ABNORMAL HIGH (ref 70–99)
Glucose-Capillary: 96 mg/dL (ref 70–99)
Glucose-Capillary: 113 mg/dL — ABNORMAL HIGH (ref 70–99)
Glucose-Capillary: 129 mg/dL — ABNORMAL HIGH (ref 70–99)

## 2024-02-21 MED ORDER — PHENYLEPHRINE-MINERAL OIL-PET 0.25-14-74.9 % RE OINT
1.0000 | TOPICAL_OINTMENT | Freq: Two times a day (BID) | RECTAL | Status: DC | PRN
  Administered 2024-02-22: 1 via RECTAL
  Filled 2024-02-21: qty 57

## 2024-02-21 MED ORDER — LORAZEPAM 0.5 MG PO TABS
0.5000 mg | ORAL_TABLET | Freq: Two times a day (BID) | ORAL | Status: AC
  Administered 2024-02-21 – 2024-02-22 (×3): 0.5 mg via ORAL
  Filled 2024-02-21 (×3): qty 1

## 2024-02-21 MED ORDER — DEXLANSOPRAZOLE 60 MG PO CPDR
60.0000 mg | DELAYED_RELEASE_CAPSULE | Freq: Every day | ORAL | Status: DC
  Administered 2024-02-22 – 2024-02-24 (×3): 60 mg via ORAL

## 2024-02-21 MED ORDER — DEXLANSOPRAZOLE 60 MG PO CPDR
60.0000 mg | DELAYED_RELEASE_CAPSULE | Freq: Every day | ORAL | Status: DC
Start: 2024-02-21 — End: 2024-02-21
  Administered 2024-02-21: 60 mg via ORAL

## 2024-02-21 NOTE — [facility] Suicide Risk Assessment (Signed)
 [facility] INPATIENT:  Family/Significant Other Suicide Prevention Education  Suicide Prevention Education:  Education Completed; Bryan Huffman (dad) [phone],  (name of family member/significant other) has been identified by the patient as the family member/significant other with whom the patient will be residing, and identified as the person(s) who will aid the patient in the event of a mental health crisis (suicidal ideations/suicide attempt).  With written consent from the patient, the family member/significant other has been provided the following suicide prevention education, prior to the and/or following the discharge of the patient.  Pt's father would like pt to go to residential substance use treatment at discharge, states this has been an ongoing issue for 35 years. There is a handgun in patients house but father will remove it from the house prior to patient being discharged.   The suicide prevention education provided includes the following: Suicide risk factors Suicide prevention and interventions National Suicide Hotline telephone number [facility] assessment telephone number [facility] Emergency Assistance 911 [facility] and/or Residential Mobile Crisis Unit telephone number  Request made of family/significant other to: Remove weapons (e.g., guns, rifles, knives), all items previously/currently identified as safety concern.   Remove drugs/medications (over-the-counter, prescriptions, illicit drugs), all items previously/currently identified as a safety concern.  The family member/significant other verbalizes understanding of the suicide prevention education information provided.  The family member/significant other agrees to remove the items of safety concern listed above.  Bryan Huffman 02/21/2024, 11:01 AM

## 2024-02-21 NOTE — Plan of Care (Signed)
   Problem: Education: Goal: Emotional status will improve Outcome: Progressing Goal: Mental status will improve Outcome: Progressing   Problem: Activity: Goal: Interest or engagement in activities will improve Outcome: Progressing Goal: Sleeping patterns will improve Outcome: Progressing   Problem: Safety: Goal: Periods of time without injury will increase Outcome: Progressing

## 2024-02-21 NOTE — Progress Notes (Signed)
   02/21/24 0949  Psych Admission Type (Psych Patients Only)  Admission Status Voluntary  Psychosocial Assessment  Patient Complaints Irritability  Eye Contact Fair  Facial Expression Anxious  Affect Angry  Speech Logical/coherent  Interaction Assertive  Motor Activity Slow  Appearance/Hygiene Unremarkable  Behavior Characteristics Cooperative  Mood Anxious;Irritable  Thought Process  Coherency WDL  Content Blaming others  Delusions None reported or observed  Perception WDL  Hallucination None reported or observed  Judgment Impaired  Confusion None  Danger to Self  Current suicidal ideation? Denies  Agreement Not to Harm Self Yes  Description of Agreement Verbal  Danger to Others  Danger to Others None reported or observed

## 2024-02-21 NOTE — Group Note (Addendum)
 LCSW Group Therapy Note   Group Date: 02/21/2024 Start Time: 1100 End Time: 1200  Participation:  patient was present.  Patient listened and was respectful but didn't participate in the conversation.  Type of Therapy:  Group Therapy  Topic:  Understanding your Path to Change   Objective:  The goal is to help individuals understand the stages of change, identify where they currently are in the process, and provide actionable next steps to continue moving forward in their journey of change.  Goals: - Learn about the six stages of change:  Precontemplation, Contemplation, Preparation, Action, Maintenance, and Relapse - Reflect on Current Change Efforts:  Recognize which stage participants are in regarding a personal change. -  Plan Next Steps for Moving Forward:  Create an action plan based on their current stage of change.  Class Summary:  In this session, we explored the Stages of Change as a framework to understand the process of change.  We discussed how each stage helps individuals recognize where they are in their personal journey and used the Stages of Change Worksheet for self-reflection. Participants answered questions to better understand their current stage, challenges, and progress. We also emphasized the importance of moving forward, even if setbacks (Relapse) occur, and created actionable steps to help participants continue progressing. By the end of the session, participants gained a clearer understanding of their path to change and left with a clear plan for next steps.  Modalities:  Elements of CBT (cognitive restructuring, problem solving)  Element of DBT (mindfulness, distress tolerance)   Bryan Huffman O Bryan Huffman, LCSWA 02/21/2024  3:47 PM

## 2024-02-21 NOTE — Group Note (Signed)
 Occupational Therapy Group Note  Group Topic: Sleep Hygiene  Group Date: 02/21/2024 Start Time: 1400 End Time: 1430 Facilitators: Lynnda Sas, OT   Group Description: Group encouraged increased participation and engagement through topic focused on sleep hygiene. Patients reflected on the quality of sleep they typically receive and identified areas that need improvement. Group was given background information on sleep and sleep hygiene, including common sleep disorders. Group members also received information on how to improve one's sleep and introduced a sleep diary as a tool that can be utilized to track sleep quality over a length of time. Group session ended with patients identifying one or more strategies they could utilize or implement into their sleep routine in order to improve overall sleep quality.        Therapeutic Goal(s):  Identify one or more strategies to improve overall sleep hygiene  Identify one or more areas of sleep that are negatively impacted (sleep too much, too little, etc)     Participation Level: Engaged   Participation Quality: Independent   Behavior: Appropriate   Speech/Thought Process: Relevant   Affect/Mood: Appropriate   Insight: Fair   Judgement: Fair      Modes of Intervention: Education  Patient Response to Interventions:  Attentive   Plan: Continue to engage patient in OT groups 2 - 3x/week.  02/21/2024  Lynnda Sas, OT   Bryan Huffman, OT

## 2024-02-21 NOTE — Progress Notes (Addendum)
 [facility] MD Progress Note  02/21/2024 10:27 AM Bryan Huffman  MRN:  [id number] Subjective:   Bryan Huffman is a 53 yr old male who presented on 5/11 to [facility] due to SI and substance abuse, he was admitted to [facility] on 5/13. PPHx is significant for Depression, Anxiety, and Polysubstance Abuse (EtOH, Cocaine), 1 Suicide Attempt (OD and held gun to head- 11/2013), and ~5 Prior Psychiatric Hospitalizations ([place]  few years ago), and no history of Self Injurious Behavior.    Case was discussed in the multidisciplinary team. MAR was reviewed and patient was compliant with medications.  He received PRN Advil  X2 yesterday.   Psychiatric Team made the following recommendations yesterday: -Continue Effexor  37.5 mg daily for depression and anxiety -Continue Seroquel  100 mg QHS for augmentation and sleep    On interview today patient reports he slept good last night.  He reports his appetite is doing good.  He reports no SI, HI, or AVH.  He reports no Paranoia or Ideas of Reference.  He reports no issues with his medications.  He reports he is having significant headaches and agrees it is most likely due to his changes in blood pressure.  He reports having issues with hemorrhoids last night.  Discussed with him that we would order cream for his hemorrhoids and would order his heartburn meds since his mother did bring it yesterday evening.  He reports a significant spike in anxiety due to the Ativan  being stopped.  He reports no other concerns at present.    Principal Problem: Alcohol  use disorder, severe, dependence (HCC) Diagnosis: Principal Problem:   Alcohol  use disorder, severe, dependence (HCC) Active Problems:   MDD (major depressive disorder), recurrent severe, without psychosis (HCC)   Polysubstance abuse (HCC)  Total Time spent with patient:  I personally spent 35 minutes on the unit in direct patient care. The direct patient care time included face-to-face time with the patient, reviewing  the patient's chart, communicating with other professionals, and coordinating care. Greater than 50% of this time was spent in counseling or coordinating care with the patient regarding goals of hospitalization, psycho-education, and discharge planning needs.   Past Psychiatric History:  Depression, Anxiety, and Polysubstance Abuse (EtOH, Cocaine), 1 Suicide Attempt (OD and held gun to head- 11/2013), and ~5 Prior Psychiatric Hospitalizations ([place]  few years ago), and no history of Self Injurious Behavior.   Past Medical History:  Past Medical History:  Diagnosis Date   Anxiety    Arthritis    Asthma    Back pain    Depression    GERD (gastroesophageal reflux disease)    Heart murmur    aortic sclerosis on Echo   Hemorrhoid    Hypertension    Insomnia    OSA (obstructive sleep apnea)    wears cpap    Past Surgical History:  Procedure Laterality Date   ANTERIOR CERVICAL DECOMP/DISCECTOMY FUSION N/A 09/24/2019   Procedure: ANTERIOR CERVICAL DECOMPRESSION/DISCECTOMY FUSION C5-6;  Surgeon: Mort Ards, MD;  Location: [facility] OR;  Service: Orthopedics;  Laterality: N/A;  3.5 hrs   ANTERIOR LAT LUMBAR FUSION N/A 01/29/2014   Procedure: X LIF L2-L3 WITH LATERAL PLATES -ANTERIOR LATERAL LUMBAR FUSION 1 LEVEL;  Surgeon: Mort Ards, MD;  Location: [facility] OR;  Service: Orthopedics;  Laterality: N/A;   BACK SURGERY  1998,2008   L3 L4 L5 fusion   5 total on spine   HEMORRHOID SURGERY     I & D EXTREMITY Right 09/07/2014   Procedure: DEBRIDEMENT RIGHT HAND  EXTENSOR TENOSYNOVECTOMY;  Surgeon: Sheryl Donna, MD;  Location: [facility];  Service: Orthopedics;  Laterality: Right;   I & D EXTREMITY Right 12/14/2014   Procedure: IRRIGATION AND DEBRIDEMENT EXTREMITY RIGHT HAND;  Surgeon: Rober Chimera, MD;  Location: [facility];  Service: Orthopedics;  Laterality: Right;  RRIGATION AND DEBRIDEMENT EXTREMITY RIGHT HAND   LUMBAR LAMINECTOMY/DECOMPRESSION MICRODISCECTOMY  Left 10/15/2013   Procedure: LUMBAR 2-3 LEFT DISCECTOMY;  Surgeon: Mort Ards, MD;  Location: [facility] OR;  Service: Orthopedics;  Laterality: Left;   LUMBAR SPINE SURGERY  10/15/2013   L 2 L3  DISECTOMY   NM MYOVIEW  LTD  10/2018   NORMAL study. EF > 65% (Hyperdynamic). NO ST segment changes with exercise (Neg EKG ST) with NO ISCHEMIA OR INFARCTION.   SPINE SURGERY     TONSILLECTOMY     TOTAL HIP ARTHROPLASTY Left 10/14/2020   Procedure: TOTAL HIP ARTHROPLASTY ANTERIOR APPROACH;  Surgeon: Adonica Hoose, MD;  Location: [facility];  Service: Orthopedics;  Laterality: Left;   TRANSTHORACIC ECHOCARDIOGRAM  07/2019   EF 55 to 60%.  Normal LV size and function.  No LVH.  GR 1 DD but normal LA size.  Normal RV size and function.  Trivial pericardial effusion.  Mild to moderate AOV calcification/porosis but no stenosis.  Normal mitral and tricuspid valves.  Normal right ventricular pressures and right atrial pressure.   Family History:  Family History  Problem Relation Age of Onset   Cancer Mother        breast   Hyperlipidemia Father    CAD Father 71       Had stents placed for angina.   Diabetes Father    Kidney Stones Father    Other Brother        Suspected suicide   Family Psychiatric  History:  Paternal Grandfather- EtOH Abuse Maternal Grandmother- Unknown diagnosis' Multiple Cousins Paternal Side- EtOH Abuse No Known Suicides  Social History:  Social History   Substance and Sexual Activity  Alcohol  Use Yes   Comment: 2-3 beers a week several times a week     Social History   Substance and Sexual Activity  Drug Use Yes   Types: Marijuana, Cocaine   Comment: CBD edibles    Social History   Socioeconomic History   Marital status: Divorced    Spouse name: Not on file   Number of children: 1   Years of education: Not on file   Highest education level: Not on file  Occupational History   Not on file  Tobacco Use   Smoking status: Every Day    Current packs/day: 0.50     Average packs/day: 0.5 packs/day for 18.0 years (9.0 ttl pk-yrs)    Types: Cigarettes   Smokeless tobacco: Never   Tobacco comments:    cutting back  Vaping Use   Vaping status: Every Day   Substances: Nicotine , Flavoring  Substance and Sexual Activity   Alcohol  use: Yes    Comment: 2-3 beers a week several times a week   Drug use: Yes    Types: Marijuana, Cocaine    Comment: CBD edibles   Sexual activity: Not Currently    Birth control/protection: None  Other Topics Concern   Not on file  Social History Narrative   He is now on disability because of chronic back pain.  His son does not really come around very much and he is divorced so he does not want.   Not very active.  Trying to quit smoking.         Apparently he was turned down for recurrent disability a is trying to get additional coverage for medication coverage.  (Social Security insurance? coverage)   Social Drivers of Health   Financial Resource Strain: Not on file  Food Insecurity: Food Insecurity Present (02/18/2024)   Hunger Vital Sign    Worried About Running Out of Food in the Last Year: Often true    Ran Out of Food in the Last Year: Often true  Transportation Needs: No Transportation Needs (02/18/2024)   PRAPARE - Administrator, Civil Service (Medical): No    Lack of Transportation (Non-Medical): No  Physical Activity: Not on file  Stress: Not on file  Social Connections: Not on file   Additional Social History:                         Sleep: Good   Appetite:  Good  Current Medications: Current Facility-Administered Medications  Medication Dose Route Frequency Provider Last Rate Last Admin   albuterol  (VENTOLIN  HFA) 108 (90 Base) MCG/ACT inhaler 1 puff  1 puff Inhalation Q6H PRN Michel Agreste, Josephine C, NP       alum & mag hydroxide-simeth (MAALOX/MYLANTA) 200-200-20 MG/5ML suspension 30 mL  30 mL Oral Q4H PRN Michel Agreste, Josephine C, NP       amLODipine  (NORVASC ) tablet 5 mg  5 mg  Oral Daily Onuoha, Josephine C, NP   5 mg at 02/21/24 0928   [START ON 02/22/2024] dexlansoprazole  (DEXILANT ) capsule 60 mg  60 mg Oral Q0600 Green, Terri L, RPH       haloperidol (HALDOL) tablet 5 mg  5 mg Oral TID PRN Onuoha, Josephine C, NP       And   diphenhydrAMINE  (BENADRYL ) capsule 50 mg  50 mg Oral TID PRN Onuoha, Josephine C, NP       haloperidol lactate (HALDOL) injection 5 mg  5 mg Intramuscular TID PRN Onuoha, Josephine C, NP       And   diphenhydrAMINE  (BENADRYL ) injection 50 mg  50 mg Intramuscular TID PRN Onuoha, Josephine C, NP       And   LORazepam  (ATIVAN ) injection 2 mg  2 mg Intramuscular TID PRN Onuoha, Josephine C, NP       haloperidol lactate (HALDOL) injection 10 mg  10 mg Intramuscular TID PRN Onuoha, Josephine C, NP       And   diphenhydrAMINE  (BENADRYL ) injection 50 mg  50 mg Intramuscular TID PRN Onuoha, Josephine C, NP       And   LORazepam  (ATIVAN ) injection 2 mg  2 mg Intramuscular TID PRN Onuoha, Josephine C, NP       fluticasone furoate-vilanterol (BREO ELLIPTA) 200-25 MCG/ACT 1 puff  1 puff Inhalation Daily Onuoha, Josephine C, NP   1 puff at 02/21/24 0931   folic acid  (FOLVITE ) tablet 1 mg  1 mg Oral Daily Onuoha, Josephine C, NP   1 mg at 02/21/24 7829   ibuprofen  (ADVIL ) tablet 600 mg  600 mg Oral Q6H PRN Kymia Simi S, MD   600 mg at 02/21/24 0932   insulin  aspart (novoLOG ) injection 0-15 Units  0-15 Units Subcutaneous TID WC Secret Kristensen, Knute Perla, MD       insulin  aspart (novoLOG ) injection 0-5 Units  0-5 Units Subcutaneous QHS Daunte Oestreich S, MD       lisinopril  (ZESTRIL ) tablet 10 mg  10 mg Oral Daily Onuoha,  Josephine C, NP   10 mg at 02/21/24 4098   LORazepam  (ATIVAN ) tablet 0.5 mg  0.5 mg Oral BID Scotty Pinder S, MD       metFORMIN  (GLUCOPHAGE -XR) 24 hr tablet 500 mg  500 mg Oral BID WC Onuoha, Josephine C, NP   500 mg at 02/21/24 1191   metoprolol  succinate (TOPROL -XL) 24 hr tablet 100 mg  100 mg Oral Daily Onuoha, Josephine C,  NP   100 mg at 02/21/24 4782   multivitamin with minerals tablet 1 tablet  1 tablet Oral Daily Onuoha, Josephine C, NP   1 tablet at 02/21/24 0928   nicotine  (NICODERM CQ  - dosed in mg/24 hours) patch 14 mg  14 mg Transdermal Daily Onuoha, Chinwendu V, NP   14 mg at 02/19/24 9562   phenylephrine -shark liver oil-mineral oil-petrolatum (PREPARATION H) rectal ointment 1 Application  1 Application Rectal BID PRN Javar Eshbach S, MD       QUEtiapine  (SEROQUEL ) tablet 100 mg  100 mg Oral QHS Maelin Kurkowski S, MD   100 mg at 02/20/24 2110   thiamine  (Vitamin B-1) tablet 100 mg  100 mg Oral Daily Onuoha, Josephine C, NP   100 mg at 02/21/24 1308   Or   thiamine  (VITAMIN B1) injection 100 mg  100 mg Intravenous Daily Onuoha, Josephine C, NP       venlafaxine  XR (EFFEXOR -XR) 24 hr capsule 37.5 mg  37.5 mg Oral Q breakfast Hallie Ishida S, MD   37.5 mg at 02/21/24 6578    Lab Results:  Results for orders placed or performed during the hospital encounter of 02/18/24 (from the past 48 hours)  Glucose, capillary     Status: Abnormal   Collection Time: 02/19/24  4:58 PM  Result Value Ref Range   Glucose-Capillary 150 (H) 70 - 99 mg/dL    Comment: Glucose reference range applies only to samples taken after fasting for at least 8 hours.  Glucose, capillary     Status: Abnormal   Collection Time: 02/19/24  9:19 PM  Result Value Ref Range   Glucose-Capillary 107 (H) 70 - 99 mg/dL    Comment: Glucose reference range applies only to samples taken after fasting for at least 8 hours.  Glucose, capillary     Status: Abnormal   Collection Time: 02/20/24  5:52 AM  Result Value Ref Range   Glucose-Capillary 105 (H) 70 - 99 mg/dL    Comment: Glucose reference range applies only to samples taken after fasting for at least 8 hours.   Comment 1 Notify RN    Comment 2 Document in Chart   Glucose, capillary     Status: Abnormal   Collection Time: 02/20/24 11:54 AM  Result Value Ref Range    Glucose-Capillary 105 (H) 70 - 99 mg/dL    Comment: Glucose reference range applies only to samples taken after fasting for at least 8 hours.  Glucose, capillary     Status: Abnormal   Collection Time: 02/20/24  5:00 PM  Result Value Ref Range   Glucose-Capillary 119 (H) 70 - 99 mg/dL    Comment: Glucose reference range applies only to samples taken after fasting for at least 8 hours.   Comment 1 Notify RN    Comment 2 Document in Chart   Lipid panel     Status: Abnormal   Collection Time: 02/20/24  6:24 PM  Result Value Ref Range   Cholesterol 232 (H) 0 - 200 mg/dL   Triglycerides 469 (H) <150  mg/dL   HDL 51 >42 mg/dL   Total CHOL/HDL Ratio 4.5 RATIO   VLDL 49 (H) 0 - 40 mg/dL   LDL Cholesterol 706 (H) 0 - 99 mg/dL    Comment:        Total Cholesterol/HDL:CHD Risk Coronary Heart Disease Risk Table                     Men   Women  1/2 Average Risk   3.4   3.3  Average Risk       5.0   4.4  2 X Average Risk   9.6   7.1  3 X Average Risk  23.4   11.0        Use the calculated Patient Ratio above and the CHD Risk Table to determine the patient's CHD Risk.        ATP III CLASSIFICATION (LDL):  <100     mg/dL   Optimal  237-628  mg/dL   Near or Above                    Optimal  130-159  mg/dL   Borderline  315-176  mg/dL   High  >160     mg/dL   Very High Performed at [facility], [address], [place], [place] [address]   Comprehensive metabolic panel with GFR     Status: Abnormal   Collection Time: 02/20/24  6:24 PM  Result Value Ref Range   Sodium 132 (L) 135 - 145 mmol/L   Potassium 4.0 3.5 - 5.1 mmol/L   Chloride 99 98 - 111 mmol/L   CO2 25 22 - 32 mmol/L   Glucose, Bld 122 (H) 70 - 99 mg/dL    Comment: Glucose reference range applies only to samples taken after fasting for at least 8 hours.   BUN 15 6 - 20 mg/dL   Creatinine, Ser 6.26 0.61 - 1.24 mg/dL   Calcium 9.8 8.9 - 94.8 mg/dL   Total Protein 7.9 6.5 - 8.1 g/dL   Albumin 3.9 3.5 - 5.0  g/dL   AST 546 (H) 15 - 41 U/L   ALT 116 (H) 0 - 44 U/L   Alkaline Phosphatase 70 38 - 126 U/L   Total Bilirubin 0.6 0.0 - 1.2 mg/dL   GFR, Estimated >27 >03 mL/min    Comment: (NOTE) Calculated using the CKD-EPI Creatinine Equation (2021)    Anion gap 8 5 - 15    Comment: Performed at [facility], [address], [place], [place] [address]  TSH     Status: Abnormal   Collection Time: 02/20/24  6:24 PM  Result Value Ref Range   TSH 8.536 (H) 0.350 - 4.500 uIU/mL    Comment: Performed by a 3rd Generation assay with a functional sensitivity of <=0.01 uIU/mL. Performed at [facility], [address], [place], [place] [address]   Glucose, capillary     Status: Abnormal   Collection Time: 02/20/24  8:08 PM  Result Value Ref Range   Glucose-Capillary 125 (H) 70 - 99 mg/dL    Comment: Glucose reference range applies only to samples taken after fasting for at least 8 hours.   Comment 1 Notify RN   Glucose, capillary     Status: Abnormal   Collection Time: 02/21/24  6:17 AM  Result Value Ref Range   Glucose-Capillary 106 (H) 70 - 99 mg/dL    Comment: Glucose reference range  applies only to samples taken after fasting for at least 8 hours.   Comment 1 Notify RN     Blood Alcohol  level:  Lab Results  Component Value Date   ETH 246 (H) 02/17/2024   ETH 286 (H) 08/28/2022    Metabolic Disorder Labs: Lab Results  Component Value Date   HGBA1C 6.9 (H) 01/11/2024   MPG 154.2 09/25/2019   No results found for: "PROLACTIN" Lab Results  Component Value Date   CHOL 232 (H) 02/20/2024   TRIG 247 (H) 02/20/2024   HDL 51 02/20/2024   CHOLHDL 4.5 02/20/2024   VLDL 49 (H) 02/20/2024   LDLCALC 132 (H) 02/20/2024   LDLCALC 136 (H) 06/27/2023    Physical Findings: AIMS:  , ,  ,  ,    CIWA:  CIWA-Ar Total: 4 COWS:     Musculoskeletal: Strength & Muscle Tone: within normal limits Gait & Station: unsteady Patient leans: N/A  Psychiatric  Specialty Exam:  Presentation  General Appearance:  Disheveled  Eye Contact: Fair  Speech: Clear and Coherent; Normal Rate  Speech Volume: Normal  Handedness:No data recorded  Mood and Affect  Mood: Irritable; Dysphoric  Affect: Congruent   Thought Process  Thought Processes: Coherent; Goal Directed  Descriptions of Associations:Intact  Orientation:Full (Time, Place and Person)  Thought Content:Logical; WDL  History of Schizophrenia/Schizoaffective disorder:No  Duration of Psychotic Symptoms:No data recorded Hallucinations:Hallucinations: None  Ideas of Reference:None  Suicidal Thoughts:Suicidal Thoughts: No  Homicidal Thoughts:Homicidal Thoughts: No   Sensorium  Memory: Immediate Fair; Recent Fair  Judgment: Intact  Insight: Present   Executive Functions  Concentration: Fair  Attention Span: Fair  Recall: [employer] of Knowledge: Fair  Language: Fair   Psychomotor Activity  Psychomotor Activity: Psychomotor Activity: Normal   Assets  Assets: Communication Skills; Desire for Improvement; Resilience   Sleep  Sleep: Sleep: Good Number of Hours of Sleep: 10.5    Physical Exam: Physical Exam Vitals and nursing note reviewed.  Constitutional:      General: He is not in acute distress.    Appearance: Normal appearance. He is obese. He is not ill-appearing or toxic-appearing.  HENT:     Head: Normocephalic and atraumatic.  Pulmonary:     Effort: Pulmonary effort is normal.  Musculoskeletal:        General: Normal range of motion.  Neurological:     General: No focal deficit present.     Mental Status: He is alert.    Review of Systems  Respiratory:  Negative for cough and shortness of breath.   Cardiovascular:  Negative for chest pain.  Gastrointestinal:  Positive for heartburn and nausea. Negative for abdominal pain, constipation, diarrhea and vomiting.  Neurological:  Positive for dizziness and headaches.  Negative for weakness.  Psychiatric/Behavioral:  Positive for depression. Negative for hallucinations and suicidal ideas. The patient is nervous/anxious.    Blood pressure 136/81, pulse 75, temperature 97.6 F (36.4 C), temperature source Oral, resp. rate 16, height 5\' 6"  (1.676 m), weight 115.7 kg, SpO2 98%. Body mass index is 41.16 kg/m.   Treatment Plan Summary: Daily contact with patient to assess and evaluate symptoms and progress in treatment and Medication management  Bryan Huffman is a 53 yr old male who presented on 5/11 to [facility] due to [facility] and substance abuse, he was admitted to [facility] on 5/13. PPHx is significant for Depression, Anxiety, and Polysubstance Abuse (EtOH, Cocaine), 1 Suicide Attempt (OD and held gun to head- 11/2013), and ~5 Prior Psychiatric  Hospitalizations ([place]  few years ago), and no history of Self Injurious Behavior.    Bryan Huffman continues to be irritable due to significant headache from changes in his blood pressure, hemorrhoid, and increase in anxiety.  We will order hemorrhoid cream.  We will start a short taper of ativan  to further help his withdrawal symptoms.  We will continue to monitor his blood pressure.  We will not make any other changes to his medications.  We will continue to monitor.    MDD, Recurrent, Severe, w/out Psychosis  GAD: -Continue Effexor  37.5 mg daily for depression and anxiety -Continue Seroquel  100 mg QHS for augmentation and sleep -Continue Agitation Protocol: Haldol/Ativan /Benadryl      Withdrawal: -Continue CIWA, last score= 4  @ 2200  5/14 -Start Ativan  0.5 mg BID for 3 doses -Continue Thiamine  100 mg daily for nutritional supplementation -Continue Folic Acid  1 mg daily for nutritional supplementation -Continue Multivitamin daily for nutritional supplementation     Asthma: -Continue Albuterol  1-2 puffs q6 PRN  -Continue Breo Ellipta 200-25 1 puff daily     HTN: -Continue Amlodipine  5 mg daily -Continue Lisinopril  10 mg  daily -Continue Metoprolol  100 mg daily     Diabetes: -Continue Metformin  500 mg BID -Continue SSI    Hemorrhoids: -Start Preparation H BID PRN   -Restart home Dexilant  60 mg daily  -Continue PRN's: Maalox, Advil     --  The risks/benefits/side-effects/alternatives to medications were discussed in detail with the patient and time was given for questions. The patient consents to medication trials.                -- Metabolic profile and EKG monitoring obtained while on an atypical antipsychotic (BMI: Lipid Panel: HbgA1c: QTc:)              -- Encouraged patient to participate in unit milieu and in scheduled group therapies              Safety and Monitoring:             -- Voluntary admission to inpatient psychiatric unit for safety, stabilization and treatment             -- Daily contact with patient to assess and evaluate symptoms and progress in treatment             -- Patient's case to be discussed in multi-disciplinary team meeting             -- Observation Level : q15 minute checks             -- Vital signs:  q12 hours             -- Precautions: suicide, elopement, and assault  Discharge Planning:              -- Social work and case management to assist with discharge planning and identification of hospital follow-up needs prior to discharge             -- Estimated LOS: 3-5 more days             -- Discharge Concerns: Need to establish a safety plan; Medication compliance and effectiveness             -- Discharge Goals: Return home with outpatient referrals for mental health follow-up including medication management/psychotherapy   Basilia Bosworth, MD 02/21/2024, 10:27 AM

## 2024-02-21 NOTE — Plan of Care (Signed)
   Problem: Education: Goal: Emotional status will improve Outcome: Progressing Goal: Mental status will improve Outcome: Progressing Goal: Verbalization of understanding the information provided will improve Outcome: Progressing   Problem: Activity: Goal: Interest or engagement in activities will improve Outcome: Progressing

## 2024-02-22 DIAGNOSIS — F1020 Alcohol dependence, uncomplicated: Secondary | ICD-10-CM

## 2024-02-22 LAB — GLUCOSE, CAPILLARY
Glucose-Capillary: 111 mg/dL — ABNORMAL HIGH (ref 70–99)
Glucose-Capillary: 120 mg/dL — ABNORMAL HIGH (ref 70–99)
Glucose-Capillary: 136 mg/dL — ABNORMAL HIGH (ref 70–99)
Glucose-Capillary: 125 mg/dL — ABNORMAL HIGH (ref 70–99)

## 2024-02-22 MED ORDER — HYDROXYZINE HCL 50 MG PO TABS
50.0000 mg | ORAL_TABLET | Freq: Three times a day (TID) | ORAL | Status: DC | PRN
  Administered 2024-02-22 – 2024-02-24 (×5): 50 mg via ORAL
  Filled 2024-02-22 (×6): qty 1

## 2024-02-22 MED ORDER — AMLODIPINE BESYLATE 10 MG PO TABS
10.0000 mg | ORAL_TABLET | Freq: Every day | ORAL | Status: DC
  Filled 2024-02-22 (×2): qty 1

## 2024-02-22 MED ORDER — QUETIAPINE FUMARATE 200 MG PO TABS
200.0000 mg | ORAL_TABLET | Freq: Every day | ORAL | Status: DC
  Administered 2024-02-22 – 2024-02-23 (×2): 200 mg via ORAL
  Filled 2024-02-22 (×3): qty 1

## 2024-02-22 MED ORDER — VENLAFAXINE HCL ER 75 MG PO CP24
75.0000 mg | ORAL_CAPSULE | Freq: Every day | ORAL | Status: DC
  Administered 2024-02-23 – 2024-02-24 (×2): 75 mg via ORAL
  Filled 2024-02-22 (×3): qty 1

## 2024-02-22 MED ORDER — AMLODIPINE BESYLATE 5 MG PO TABS
5.0000 mg | ORAL_TABLET | Freq: Once | ORAL | Status: AC
  Administered 2024-02-22: 5 mg via ORAL
  Filled 2024-02-22 (×2): qty 1

## 2024-02-22 NOTE — Group Note (Signed)
 Recreation Therapy Group Note   Group Topic:Problem Solving  Group Date: 02/22/2024 Start Time: 0935 End Time: 0958 Facilitators: Ka Flammer-McCall, LRT,CTRS Location: [address] Dayroom   Group Topic: Communication, Team Building, Problem Solving  Goal Area(s) Addresses:  Patient will effectively work with peer towards shared goal.  Patient will identify skills used to make activity successful.  Patient will identify how skills used during activity can be used to reach post d/c goals.   Intervention: STEM Activity  Activity: Stage manager. In teams of 3-5, patients were given 12 plastic drinking straws and an equal length of masking tape. Using the materials provided, patients were asked to build a landing pad to catch a golf ball dropped from approximately 5 feet in the air. All materials were required to be used by the team in their design. LRT facilitated post-activity discussion.  Education: Pharmacist, community, Scientist, physiological, Discharge Planning   Education Outcome: Acknowledges education/In group clarification offered/Needs additional education.    Affect/Mood: Appropriate   Participation Level: Moderate   Participation Quality: Independent   Behavior: Appropriate   Speech/Thought Process: Focused   Insight: Moderate   Judgement: Moderate   Modes of Intervention: STEM Activity   Patient Response to Interventions:  Receptive   Education Outcome:  In group clarification offered    Clinical Observations/Individualized Feedback: Pt came in late to group. Pt joined in with one of the groups. Pt observed but then offered some suggestions. Pt was bright and attentive.     Plan: Continue to engage patient in RT group sessions 2-3x/week.   Derrius Furtick-McCall, LRT,CTRS 02/22/2024 12:55 PM

## 2024-02-22 NOTE — Plan of Care (Signed)
  Problem: Education: Goal: Mental status will improve Outcome: Progressing Goal: Verbalization of understanding the information provided will improve Outcome: Progressing   Problem: Activity: Goal: Interest or engagement in activities will improve Outcome: Progressing Goal: Sleeping patterns will improve Outcome: Progressing   Problem: Safety: Goal: Periods of time without injury will increase Outcome: Progressing

## 2024-02-22 NOTE — Group Note (Signed)
 Date:  02/22/2024 Time:  2:22 AM  Group Topic/Focus:  Wrap-Up Group:   The focus of this group is to help patients review their daily goal of treatment and discuss progress on daily workbooks.    Participation Level:  Active  Participation Quality:  Appropriate and Sharing  Affect:  Appropriate  Cognitive:  Appropriate  Insight: Appropriate  Engagement in Group:  Engaged  Modes of Intervention:  Activity and Socialization  Additional Comments:  Patient attended and participated wrap up group. Patient shared about their day and participated in activity.   Dillard Frame 02/22/2024, 2:22 AM

## 2024-02-22 NOTE — Progress Notes (Signed)
   02/22/24 0800  Psych Admission Type (Psych Patients Only)  Admission Status Voluntary  Psychosocial Assessment  Patient Complaints Anxiety;Depression  Eye Contact Fair  Facial Expression Anxious  Affect Angry  Speech Logical/coherent  Interaction Assertive  Motor Activity Slow  Appearance/Hygiene Unremarkable  Behavior Characteristics Cooperative  Mood Anxious  Thought Process  Coherency WDL  Content Blaming others  Delusions None reported or observed  Perception WDL  Hallucination None reported or observed  Judgment Impaired  Confusion None  Danger to Self  Current suicidal ideation? Denies  Agreement Not to Harm Self Yes  Description of Agreement verbal  Danger to Others  Danger to Others None reported or observed

## 2024-02-22 NOTE — Progress Notes (Signed)
   02/22/24 0603  Vital Signs  Temp (!) 97.5 F (36.4 C)  Temp Source Oral  Pulse Rate 73  Pulse Rate Source Monitor  Resp 18  BP (!) 169/106  BP Location Right Arm  BP Method Automatic  Patient Position (if appropriate) Sitting  Oxygen  Therapy  SpO2 99 %   Patient c/o headache with elevated BP. 0800 BP medication and Ibuprofen  given. Will report oncoming RN to reassess.

## 2024-02-22 NOTE — Progress Notes (Signed)
 Adult Psychoeducational Group Note  Date:  02/22/2024 Time:  8:36 PM  Group Topic/Focus:  Wrap-Up Group:   The focus of this group is to help patients review their daily goal of treatment and discuss progress on daily workbooks.  Participation Level:  Active  Participation Quality:  Appropriate  Affect:  Appropriate  Cognitive:  Appropriate  Insight: Appropriate  Engagement in Group:  Engaged  Modes of Intervention:  Discussion  Additional Comments:  Attend wrap up group AA.  Bryan Huffman 02/22/2024, 8:36 PM

## 2024-02-22 NOTE — Progress Notes (Signed)
 [facility] MD Progress Note  02/22/2024 12:35 PM DEMORIAN Huffman  MRN:  [id number] Subjective:   Bryan Huffman is a 53 yr old male who presented on 5/11 to [facility] due to SI and substance abuse, he was admitted to [facility] on 5/13. PPHx is significant for Depression, Anxiety, and Polysubstance Abuse (EtOH, Cocaine), 1 Suicide Attempt (OD and held gun to head- 11/2013), and ~5 Prior Psychiatric Hospitalizations ([place]  few years ago), and no history of Self Injurious Behavior.    Case was discussed in the multidisciplinary team. MAR was reviewed and patient was compliant with medications.  Chart review indicates no as needed medication for anxiety or agitation noted or reported.    Upon assessment today patient was seen in his room he does report interrupted sleep despite staff reported him to have 8 hours of sleep last night.  I discussed with him titrating Seroquel  tonight to 200 mg to help further with sleep as well as anxiety.  He continues to report anxiety during the daytime focusing on receiving Ativan  prior to hospitalization for treatment of panic attacks, I discussed with him risk of using Ativan  long-term causing tolerance and dependence, discussed with him starting hydroxyzine  50 mg as needed for anxiety given he reports history of failing gabapentin  and BuSpar .  He does report improved depression since admission and denies any further passive or active SI intention or plan.  He denies any craving to alcohol .  He denies side effects to current medications.  We discussed titrating Effexor  to help further with depression symptoms.  Patient's blood pressure was elevated today and he had some elevated blood pressure readings even prior to starting Effexor .  We discussed titrating Norvasc  to 10 mg daily with recommendation to follow-up with primary care provider after discharge.   Principal Problem: Alcohol  use disorder, severe, dependence (HCC) Diagnosis: Principal Problem:   Alcohol  use disorder,  severe, dependence (HCC) Active Problems:   MDD (major depressive disorder), recurrent severe, without psychosis (HCC)   Polysubstance abuse (HCC)  Total Time spent with patient:  I personally spent 35 minutes on the unit in direct patient care. The direct patient care time included face-to-face time with the patient, reviewing the patient's chart, communicating with other professionals, and coordinating care. Greater than 50% of this time was spent in counseling or coordinating care with the patient regarding goals of hospitalization, psycho-education, and discharge planning needs.   Past Psychiatric History:  Depression, Anxiety, and Polysubstance Abuse (EtOH, Cocaine), 1 Suicide Attempt (OD and held gun to head- 11/2013), and ~5 Prior Psychiatric Hospitalizations ([place]  few years ago), and no history of Self Injurious Behavior.   Past Medical History:  Past Medical History:  Diagnosis Date   Anxiety    Arthritis    Asthma    Back pain    Depression    GERD (gastroesophageal reflux disease)    Heart murmur    aortic sclerosis on Echo   Hemorrhoid    Hypertension    Insomnia    OSA (obstructive sleep apnea)    wears cpap    Past Surgical History:  Procedure Laterality Date   ANTERIOR CERVICAL DECOMP/DISCECTOMY FUSION N/A 09/24/2019   Procedure: ANTERIOR CERVICAL DECOMPRESSION/DISCECTOMY FUSION C5-6;  Surgeon: Mort Ards, MD;  Location: [facility] OR;  Service: Orthopedics;  Laterality: N/A;  3.5 hrs   ANTERIOR LAT LUMBAR FUSION N/A 01/29/2014   Procedure: X LIF L2-L3 WITH LATERAL PLATES -ANTERIOR LATERAL LUMBAR FUSION 1 LEVEL;  Surgeon: Mort Ards, MD;  Location: [facility] OR;  Service: Orthopedics;  Laterality: N/A;   BACK SURGERY  1998,2008   L3 L4 L5 fusion   5 total on spine   HEMORRHOID SURGERY     I & D EXTREMITY Right 09/07/2014   Procedure: DEBRIDEMENT RIGHT HAND EXTENSOR TENOSYNOVECTOMY;  Surgeon: Sheryl Donna, MD;  Location: [facility];  Service:  Orthopedics;  Laterality: Right;   I & D EXTREMITY Right 12/14/2014   Procedure: IRRIGATION AND DEBRIDEMENT EXTREMITY RIGHT HAND;  Surgeon: Rober Chimera, MD;  Location: [facility];  Service: Orthopedics;  Laterality: Right;  RRIGATION AND DEBRIDEMENT EXTREMITY RIGHT HAND   LUMBAR LAMINECTOMY/DECOMPRESSION MICRODISCECTOMY Left 10/15/2013   Procedure: LUMBAR 2-3 LEFT DISCECTOMY;  Surgeon: Mort Ards, MD;  Location: [facility] OR;  Service: Orthopedics;  Laterality: Left;   LUMBAR SPINE SURGERY  10/15/2013   L 2 L3  DISECTOMY   NM MYOVIEW  LTD  10/2018   NORMAL study. EF > 65% (Hyperdynamic). NO ST segment changes with exercise (Neg EKG ST) with NO ISCHEMIA OR INFARCTION.   SPINE SURGERY     TONSILLECTOMY     TOTAL HIP ARTHROPLASTY Left 10/14/2020   Procedure: TOTAL HIP ARTHROPLASTY ANTERIOR APPROACH;  Surgeon: Adonica Hoose, MD;  Location: [facility];  Service: Orthopedics;  Laterality: Left;   TRANSTHORACIC ECHOCARDIOGRAM  07/2019   EF 55 to 60%.  Normal LV size and function.  No LVH.  GR 1 DD but normal LA size.  Normal RV size and function.  Trivial pericardial effusion.  Mild to moderate AOV calcification/porosis but no stenosis.  Normal mitral and tricuspid valves.  Normal right ventricular pressures and right atrial pressure.   Family History:  Family History  Problem Relation Age of Onset   Cancer Mother        breast   Hyperlipidemia Father    CAD Father 42       Had stents placed for angina.   Diabetes Father    Kidney Stones Father    Other Brother        Suspected suicide   Family Psychiatric  History:  Paternal Grandfather- EtOH Abuse Maternal Grandmother- Unknown diagnosis' Multiple Cousins Paternal Side- EtOH Abuse No Known Suicides  Social History:  Social History   Substance and Sexual Activity  Alcohol  Use Yes   Comment: 2-3 beers a week several times a week     Social History   Substance and Sexual Activity  Drug Use Yes   Types: Marijuana,  Cocaine   Comment: CBD edibles    Social History   Socioeconomic History   Marital status: Divorced    Spouse name: Not on file   Number of children: 1   Years of education: Not on file   Highest education level: Not on file  Occupational History   Not on file  Tobacco Use   Smoking status: Every Day    Current packs/day: 0.50    Average packs/day: 0.5 packs/day for 18.0 years (9.0 ttl pk-yrs)    Types: Cigarettes   Smokeless tobacco: Never   Tobacco comments:    cutting back  Vaping Use   Vaping status: Every Day   Substances: Nicotine , Flavoring  Substance and Sexual Activity   Alcohol  use: Yes    Comment: 2-3 beers a week several times a week   Drug use: Yes    Types: Marijuana, Cocaine    Comment: CBD edibles   Sexual activity: Not Currently    Birth control/protection: None  Other Topics Concern   Not  on file  Social History Narrative   He is now on disability because of chronic back pain.  His son does not really come around very much and he is divorced so he does not want.   Not very active.  Trying to quit smoking.         Apparently he was turned down for recurrent disability a is trying to get additional coverage for medication coverage.  (Social Security insurance? coverage)   Social Drivers of Health   Financial Resource Strain: Not on file  Food Insecurity: Food Insecurity Present (02/18/2024)   Hunger Vital Sign    Worried About Running Out of Food in the Last Year: Often true    Ran Out of Food in the Last Year: Often true  Transportation Needs: No Transportation Needs (02/18/2024)   PRAPARE - Administrator, Civil Service (Medical): No    Lack of Transportation (Non-Medical): No  Physical Activity: Not on file  Stress: Not on file  Social Connections: Not on file   Additional Social History:                         Sleep: Good   Appetite:  Good  Current Medications: Current Facility-Administered Medications   Medication Dose Route Frequency Provider Last Rate Last Admin   albuterol  (VENTOLIN  HFA) 108 (90 Base) MCG/ACT inhaler 1 puff  1 puff Inhalation Q6H PRN Michel Agreste, Josephine C, NP       alum & mag hydroxide-simeth (MAALOX/MYLANTA) 200-200-20 MG/5ML suspension 30 mL  30 mL Oral Q4H PRN Onuoha, Josephine C, NP       [START ON 02/23/2024] amLODipine  (NORVASC ) tablet 10 mg  10 mg Oral Daily Suttyn Cryder, MD       dexlansoprazole  (DEXILANT ) capsule 60 mg  60 mg Oral Q0600 Smiley Dung, RPH   60 mg at 02/22/24 0610   haloperidol (HALDOL) tablet 5 mg  5 mg Oral TID PRN Onuoha, Josephine C, NP       And   diphenhydrAMINE  (BENADRYL ) capsule 50 mg  50 mg Oral TID PRN Onuoha, Josephine C, NP       haloperidol lactate (HALDOL) injection 5 mg  5 mg Intramuscular TID PRN Onuoha, Josephine C, NP       And   diphenhydrAMINE  (BENADRYL ) injection 50 mg  50 mg Intramuscular TID PRN Onuoha, Josephine C, NP       And   LORazepam  (ATIVAN ) injection 2 mg  2 mg Intramuscular TID PRN Onuoha, Josephine C, NP       haloperidol lactate (HALDOL) injection 10 mg  10 mg Intramuscular TID PRN Onuoha, Josephine C, NP       And   diphenhydrAMINE  (BENADRYL ) injection 50 mg  50 mg Intramuscular TID PRN Onuoha, Josephine C, NP       And   LORazepam  (ATIVAN ) injection 2 mg  2 mg Intramuscular TID PRN Onuoha, Josephine C, NP       fluticasone furoate-vilanterol (BREO ELLIPTA) 200-25 MCG/ACT 1 puff  1 puff Inhalation Daily Onuoha, Josephine C, NP   1 puff at 02/22/24 0801   folic acid  (FOLVITE ) tablet 1 mg  1 mg Oral Daily Onuoha, Josephine C, NP   1 mg at 02/22/24 0802   hydrOXYzine  (ATARAX ) tablet 50 mg  50 mg Oral TID PRN Alver Jobs, MD       ibuprofen  (ADVIL ) tablet 600 mg  600 mg Oral Q6H PRN Jann Melody Knute Perla, MD  600 mg at 02/22/24 5188   insulin  aspart (novoLOG ) injection 0-15 Units  0-15 Units Subcutaneous TID WC Pashayan, Knute Perla, MD       insulin  aspart (novoLOG ) injection 0-5 Units  0-5 Units Subcutaneous  QHS Pashayan, Alexander S, MD       lisinopril  (ZESTRIL ) tablet 10 mg  10 mg Oral Daily Onuoha, Josephine C, NP   10 mg at 02/22/24 4166   metFORMIN  (GLUCOPHAGE -XR) 24 hr tablet 500 mg  500 mg Oral BID WC Onuoha, Josephine C, NP   500 mg at 02/22/24 0802   metoprolol  succinate (TOPROL -XL) 24 hr tablet 100 mg  100 mg Oral Daily Onuoha, Josephine C, NP   100 mg at 02/22/24 0630   multivitamin with minerals tablet 1 tablet  1 tablet Oral Daily Onuoha, Josephine C, NP   1 tablet at 02/22/24 0802   nicotine  (NICODERM CQ  - dosed in mg/24 hours) patch 14 mg  14 mg Transdermal Daily Onuoha, Chinwendu V, NP   14 mg at 02/19/24 1601   phenylephrine -shark liver oil-mineral oil-petrolatum (PREPARATION H) rectal ointment 1 Application  1 Application Rectal BID PRN Pashayan, Alexander S, MD       QUEtiapine  (SEROQUEL ) tablet 200 mg  200 mg Oral QHS Linnie Riches, Marca Gadsby, MD       thiamine  (Vitamin B-1) tablet 100 mg  100 mg Oral Daily Onuoha, Josephine C, NP   100 mg at 02/22/24 0802   Or   thiamine  (VITAMIN B1) injection 100 mg  100 mg Intravenous Daily Onuoha, Josephine C, NP       [START ON 02/23/2024] venlafaxine  XR (EFFEXOR -XR) 24 hr capsule 75 mg  75 mg Oral Q breakfast Linnie Riches, Josie Burleigh, MD        Lab Results:  Results for orders placed or performed during the hospital encounter of 02/18/24 (from the past 48 hours)  Glucose, capillary     Status: Abnormal   Collection Time: 02/20/24  5:00 PM  Result Value Ref Range   Glucose-Capillary 119 (H) 70 - 99 mg/dL    Comment: Glucose reference range applies only to samples taken after fasting for at least 8 hours.   Comment 1 Notify RN    Comment 2 Document in Chart   Lipid panel     Status: Abnormal   Collection Time: 02/20/24  6:24 PM  Result Value Ref Range   Cholesterol 232 (H) 0 - 200 mg/dL   Triglycerides 093 (H) <150 mg/dL   HDL 51 >23 mg/dL   Total CHOL/HDL Ratio 4.5 RATIO   VLDL 49 (H) 0 - 40 mg/dL   LDL Cholesterol 557 (H) 0 - 99 mg/dL    Comment:         Total Cholesterol/HDL:CHD Risk Coronary Heart Disease Risk Table                     Men   Women  1/2 Average Risk   3.4   3.3  Average Risk       5.0   4.4  2 X Average Risk   9.6   7.1  3 X Average Risk  23.4   11.0        Use the calculated Patient Ratio above and the CHD Risk Table to determine the patient's CHD Risk.        ATP III CLASSIFICATION (LDL):  <100     mg/dL   Optimal  322-025  mg/dL   Near or Above  Optimal  130-159  mg/dL   Borderline  621-308  mg/dL   High  >657     mg/dL   Very High Performed at [facility], [address], [place], [place] [address]   Comprehensive metabolic panel with GFR     Status: Abnormal   Collection Time: 02/20/24  6:24 PM  Result Value Ref Range   Sodium 132 (L) 135 - 145 mmol/L   Potassium 4.0 3.5 - 5.1 mmol/L   Chloride 99 98 - 111 mmol/L   CO2 25 22 - 32 mmol/L   Glucose, Bld 122 (H) 70 - 99 mg/dL    Comment: Glucose reference range applies only to samples taken after fasting for at least 8 hours.   BUN 15 6 - 20 mg/dL   Creatinine, Ser 2.95 0.61 - 1.24 mg/dL   Calcium 9.8 8.9 - 28.4 mg/dL   Total Protein 7.9 6.5 - 8.1 g/dL   Albumin 3.9 3.5 - 5.0 g/dL   AST 132 (H) 15 - 41 U/L   ALT 116 (H) 0 - 44 U/L   Alkaline Phosphatase 70 38 - 126 U/L   Total Bilirubin 0.6 0.0 - 1.2 mg/dL   GFR, Estimated >44 >01 mL/min    Comment: (NOTE) Calculated using the CKD-EPI Creatinine Equation (2021)    Anion gap 8 5 - 15    Comment: Performed at [facility], [address] [place], [place] [address]  TSH     Status: Abnormal   Collection Time: 02/20/24  6:24 PM  Result Value Ref Range   TSH 8.536 (H) 0.350 - 4.500 uIU/mL    Comment: Performed by a 3rd Generation assay with a functional sensitivity of <=0.01 uIU/mL. Performed at [facility], [address], [place], [place] [address]   Glucose, capillary     Status: Abnormal   Collection Time: 02/20/24   8:08 PM  Result Value Ref Range   Glucose-Capillary 125 (H) 70 - 99 mg/dL    Comment: Glucose reference range applies only to samples taken after fasting for at least 8 hours.   Comment 1 Notify RN   Glucose, capillary     Status: Abnormal   Collection Time: 02/21/24  6:17 AM  Result Value Ref Range   Glucose-Capillary 106 (H) 70 - 99 mg/dL    Comment: Glucose reference range applies only to samples taken after fasting for at least 8 hours.   Comment 1 Notify RN   Glucose, capillary     Status: None   Collection Time: 02/21/24 11:57 AM  Result Value Ref Range   Glucose-Capillary 96 70 - 99 mg/dL    Comment: Glucose reference range applies only to samples taken after fasting for at least 8 hours.  Glucose, capillary     Status: Abnormal   Collection Time: 02/21/24  5:02 PM  Result Value Ref Range   Glucose-Capillary 113 (H) 70 - 99 mg/dL    Comment: Glucose reference range applies only to samples taken after fasting for at least 8 hours.  Glucose, capillary     Status: Abnormal   Collection Time: 02/21/24  7:42 PM  Result Value Ref Range   Glucose-Capillary 129 (H) 70 - 99 mg/dL    Comment: Glucose reference range applies only to samples taken after fasting for at least 8 hours.   Comment 1 Notify RN   Glucose, capillary     Status: Abnormal   Collection Time: 02/22/24  6:02 AM  Result  Value Ref Range   Glucose-Capillary 111 (H) 70 - 99 mg/dL    Comment: Glucose reference range applies only to samples taken after fasting for at least 8 hours.   Comment 1 Notify RN    Comment 2 Document in Chart   Glucose, capillary     Status: Abnormal   Collection Time: 02/22/24 11:42 AM  Result Value Ref Range   Glucose-Capillary 120 (H) 70 - 99 mg/dL    Comment: Glucose reference range applies only to samples taken after fasting for at least 8 hours.    Blood Alcohol  level:  Lab Results  Component Value Date   ETH 246 (H) 02/17/2024   ETH 286 (H) 08/28/2022    Metabolic Disorder  Labs: Lab Results  Component Value Date   HGBA1C 6.9 (H) 01/11/2024   MPG 154.2 09/25/2019   No results found for: "PROLACTIN" Lab Results  Component Value Date   CHOL 232 (H) 02/20/2024   TRIG 247 (H) 02/20/2024   HDL 51 02/20/2024   CHOLHDL 4.5 02/20/2024   VLDL 49 (H) 02/20/2024   LDLCALC 132 (H) 02/20/2024   LDLCALC 136 (H) 06/27/2023    Physical Findings: AIMS:  , ,  ,  ,    CIWA:  CIWA-Ar Total: 0 COWS:     Musculoskeletal: Strength & Muscle Tone: within normal limits Gait & Station: unsteady Patient leans: N/A  Psychiatric Specialty Exam:  Presentation  General Appearance:  Disheveled  Eye Contact: Fair  Speech: Clear and Coherent; Normal Rate  Speech Volume: Normal  Handedness:No data recorded  Mood and Affect  Mood: Irritable; Dysphoric  Affect: Congruent   Thought Process  Thought Processes: Coherent; Goal Directed  Descriptions of Associations:Intact  Orientation:Full (Time, Place and Person)  Thought Content:Logical; WDL  History of Schizophrenia/Schizoaffective disorder:No  Duration of Psychotic Symptoms:No data recorded Hallucinations:Hallucinations: None  Ideas of Reference:None  Suicidal Thoughts:Suicidal Thoughts: No  Homicidal Thoughts:Homicidal Thoughts: No   Sensorium  Memory: Immediate Fair; Recent Fair  Judgment: Intact  Insight: Present   Executive Functions  Concentration: Fair  Attention Span: Fair  Recall: [employer] of Knowledge: Fair  Language: Fair   Psychomotor Activity  Psychomotor Activity: Psychomotor Activity: Normal   Assets  Assets: Communication Skills; Desire for Improvement; Resilience   Sleep  Sleep: Sleep: Good Number of Hours of Sleep: 10.5    Physical Exam: Physical Exam Vitals and nursing note reviewed.  Constitutional:      General: He is not in acute distress.    Appearance: Normal appearance. He is obese. He is not ill-appearing or  toxic-appearing.  HENT:     Head: Normocephalic and atraumatic.  Pulmonary:     Effort: Pulmonary effort is normal.  Musculoskeletal:        General: Normal range of motion.  Neurological:     General: No focal deficit present.     Mental Status: He is alert.    Review of Systems  Respiratory:  Negative for cough and shortness of breath.   Cardiovascular:  Negative for chest pain.  Gastrointestinal:  Positive for heartburn and nausea. Negative for abdominal pain, constipation, diarrhea and vomiting.  Neurological:  Positive for dizziness and headaches. Negative for weakness.  Psychiatric/Behavioral:  Positive for depression. Negative for hallucinations and suicidal ideas. The patient is nervous/anxious.    Blood pressure 132/71, pulse 66, temperature (!) 97.5 F (36.4 C), temperature source Oral, resp. rate 18, height 5\' 6"  (1.676 m), weight 115.7 kg, SpO2 100%. Body mass index is  41.16 kg/m.   Treatment Plan Summary: Daily contact with patient to assess and evaluate symptoms and progress in treatment and Medication management  Bryan Huffman is a 53 yr old male who presented on 5/11 to [facility] due to [facility] and substance abuse, he was admitted to  Hospital on 5/13. PPHx is significant for Depression, Anxiety, and Polysubstance Abuse (EtOH, Cocaine), 1 Suicide Attempt (OD and held gun to head- 11/2013), and ~5 Prior Psychiatric Hospitalizations ([place]  few years ago), and no history of Self Injurious Behavior.    Bryan Huffman continues to be irritable due to significant headache from changes in his blood pressure, hemorrhoid, and increase in anxiety.  We will order hemorrhoid cream.  We will start a short taper of ativan  to further help his withdrawal symptoms.  We will continue to monitor his blood pressure.  We will not make any other changes to his medications.  We will continue to monitor.    MDD, Recurrent, Severe, w/out Psychosis  GAD: -Continue Effexor  37.5 mg daily for depression and  anxiety - 5/16 titrate Seroquel  from 100-200 mg QHS for augmentation and sleep -Continue Agitation Protocol: Haldol/Ativan /Benadryl  5/16 start hydroxyzine  50 mg 3 times daily as needed for anxiety   Withdrawal: -Continue CIWA, last score= 0 on 5/16 at 8 AM - Continue Ativan  0.5 mg BID for 3 doses which was started 5/15 to finish today 5/16 -Continue Thiamine  100 mg daily for nutritional supplementation -Continue Folic Acid  1 mg daily for nutritional supplementation -Continue Multivitamin daily for nutritional supplementation     Asthma: -Continue Albuterol  1-2 puffs q6 PRN  -Continue Breo Ellipta 200-25 1 puff daily     HTN: - 5/16 titrate amlodipine  from 5 to 10 mg daily to address hypertension with recommendation for patient to follow with primary care provider after discharge -Continue Lisinopril  10 mg daily -Continue Metoprolol  100 mg daily     Diabetes: -Continue Metformin  500 mg BID -Continue SSI    Hemorrhoids: - Continue Preparation H BID PRN   -Restart home Dexilant  60 mg daily  -Continue PRN's: Maalox, Advil     --  The risks/benefits/side-effects/alternatives to medications were discussed in detail with the patient and time was given for questions. The patient consents to medication trials.                -- Metabolic profile and EKG monitoring obtained while on an atypical antipsychotic (BMI: Lipid Panel: HbgA1c: QTc:)              -- Encouraged patient to participate in unit milieu and in scheduled group therapies              Safety and Monitoring:             -- Voluntary admission to inpatient psychiatric unit for safety, stabilization and treatment             -- Daily contact with patient to assess and evaluate symptoms and progress in treatment             -- Patient's case to be discussed in multi-disciplinary team meeting             -- Observation Level : q15 minute checks             -- Vital signs:  q12 hours             -- Precautions:  suicide, elopement, and assault  Discharge Planning:              --  Social work and case management to assist with discharge planning and identification of hospital follow-up needs prior to discharge             -- Estimated LOS: 3-5 more days             -- Discharge Concerns: Need to establish a safety plan; Medication compliance and effectiveness             -- Discharge Goals: Return home with outpatient referrals for mental health follow-up including medication management/psychotherapy   Francy Mcilvaine Linnie Riches, MD 02/22/2024, 12:35 PM

## 2024-02-22 NOTE — Progress Notes (Signed)
   02/21/24 2100  Psych Admission Type (Psych Patients Only)  Admission Status Voluntary  Psychosocial Assessment  Patient Complaints Anxiety;Depression (anxiet 6/10, depression 3/10)  Eye Contact Fair  Facial Expression Anxious  Affect Angry  Speech Logical/coherent  Interaction Assertive  Motor Activity Slow  Appearance/Hygiene Unremarkable  Behavior Characteristics Cooperative  Mood Anxious  Thought Process  Coherency WDL  Content Blaming others  Delusions None reported or observed  Perception WDL  Hallucination None reported or observed  Judgment Impaired  Confusion None  Danger to Self  Current suicidal ideation? Denies  Agreement Not to Harm Self Yes  Description of Agreement verbal  Danger to Others  Danger to Others None reported or observed

## 2024-02-22 NOTE — Plan of Care (Signed)
  Problem: Education: Goal: Knowledge of [facility] Education information/materials will improve Outcome: Completed/Met Goal: Mental status will improve Outcome: Progressing   Problem: Activity: Goal: Interest or engagement in activities will improve Outcome: Progressing   Problem: Coping: Goal: Ability to demonstrate self-control will improve Outcome: Progressing

## 2024-02-22 NOTE — Progress Notes (Signed)
 Patient in room rocking, reports anxiety and irritability, states "I feel like I'm about to crawl out of my skin." Patient became increasingly annoyed and irritable as nurse spoke with patient about options to assist patient. Per agitation protocol Benadryl  50mg  and Haldol 5mg  po given. Will continue to monitor/assess patient as we continue with plan of care.

## 2024-02-23 LAB — GLUCOSE, CAPILLARY
Glucose-Capillary: 84 mg/dL (ref 70–99)
Glucose-Capillary: 105 mg/dL — ABNORMAL HIGH (ref 70–99)
Glucose-Capillary: 147 mg/dL — ABNORMAL HIGH (ref 70–99)

## 2024-02-23 MED ORDER — AMLODIPINE BESYLATE 5 MG PO TABS
5.0000 mg | ORAL_TABLET | Freq: Every day | ORAL | Status: DC
  Administered 2024-02-23 – 2024-02-24 (×2): 5 mg via ORAL
  Filled 2024-02-23 (×3): qty 1

## 2024-02-23 MED ORDER — HYDROCORTISONE (PERIANAL) 2.5 % EX CREA
TOPICAL_CREAM | Freq: Two times a day (BID) | CUTANEOUS | Status: DC
  Filled 2024-02-23: qty 28.35

## 2024-02-23 MED ORDER — LISINOPRIL 20 MG PO TABS
40.0000 mg | ORAL_TABLET | Freq: Every day | ORAL | Status: DC
  Administered 2024-02-23 – 2024-02-24 (×2): 40 mg via ORAL
  Filled 2024-02-23 (×2): qty 1
  Filled 2024-02-23 (×2): qty 2

## 2024-02-23 NOTE — Plan of Care (Signed)
  Problem: Education: Goal: Emotional status will improve Outcome: Adequate for Discharge Goal: Mental status will improve Outcome: Adequate for Discharge Goal: Verbalization of understanding the information provided will improve Outcome: Adequate for Discharge   Problem: Activity: Goal: Interest or engagement in activities will improve Outcome: Progressing Goal: Sleeping patterns will improve Outcome: Progressing

## 2024-02-23 NOTE — [facility] Group Notes (Signed)
 [facility] Group Notes:  (Nursing/MHT/Case Management/Adjunct)  Date:  02/23/2024  Time:  2000  Type of Therapy:  Wrap up group  Participation Level:  Active  Participation Quality:  Appropriate, Attentive, Sharing, and Supportive  Affect:  Depressed and Irritable  Cognitive:  Alert  Insight:  Limited  Engagement in Group:  Engaged  Modes of Intervention:  Clarification, Education, and Support  Summary of Progress/Problems: Positive thinking and positive change were discussed.   Catharine Clock 02/23/2024, 9:47 PM

## 2024-02-23 NOTE — Group Note (Signed)
 Date:  02/23/2024 Time:  10:47 AM  Group Topic/Focus:  Goals Group:   The focus of this group is to help patients establish daily goals to achieve during treatment and discuss how the patient can incorporate goal setting into their daily lives to aide in recovery. Orientation:   The focus of this group is to educate the patient on the purpose and policies of crisis stabilization and provide a format to answer questions about their admission.  The group details unit policies and expectations of patients while admitted.    Participation Level:  Active  Rafal Archuleta J Holly Iannaccone 02/23/2024, 10:47 AM

## 2024-02-23 NOTE — Progress Notes (Signed)
   02/23/24 0900  Psych Admission Type (Psych Patients Only)  Admission Status Voluntary  Psychosocial Assessment  Patient Complaints Anxiety  Eye Contact Fair  Facial Expression Anxious  Affect Flat  Speech Logical/coherent  Interaction Assertive  Motor Activity Slow  Appearance/Hygiene Unremarkable  Behavior Characteristics Cooperative  Mood Anxious  Thought Process  Coherency WDL  Content WDL  Delusions None reported or observed  Perception WDL  Hallucination None reported or observed  Judgment Impaired  Confusion None  Danger to Self  Current suicidal ideation? Denies  Agreement Not to Harm Self Yes  Description of Agreement verbal  Danger to Others  Danger to Others None reported or observed

## 2024-02-23 NOTE — Progress Notes (Signed)
 [facility] MD Progress Note  02/23/2024 1:55 PM Bryan Huffman  MRN:  [id number] Subjective:   Bryan Huffman is a 53 yr old male who presented on 5/11 to [facility] due to SI and substance abuse, he was admitted to [facility] on 5/13. PPHx is significant for Depression, Anxiety, and Polysubstance Abuse (EtOH, Cocaine), 1 Suicide Attempt (OD and held gun to head- 11/2013), and ~5 Prior Psychiatric Hospitalizations ([place]  few years ago), and no history of Self Injurious Behavior.    Case was discussed in the multidisciplinary team. MAR was reviewed and patient was compliant with medications.  He received PRN Advil  X2 yesterday.   Psychiatric Team made the following recommendations yesterday: -Continue Effexor  37.5 mg daily for depression and anxiety -Increase Seroquel  to 200 mg QHS for augmentation and sleep    On interview today patient reports he slept fair last night.  He reports his appetite is doing good.  He reports no SI, HI, or AVH.  He reports no Paranoia or Ideas of Reference.  He reports no issues with his medications.  He reports still having headaches due to his blood pressure issues.  He is hopefull that since his lisinopril  is being increased to the actual home dose of 40 mg today that his blood pressure will get under better control and this will resolved.  Discussed with him that we would increase his Effexor  this morning and would plan for discharge tomorrow so that he could attend the funeral of his best friend's mother which is tomorrow afternoon.  Discussed with him that his lipid panel and TSH did come back elevated and encouraged him to discuss this with his PCP after discharge and he was agreeable with this.  He did ask for the insulin  order to be discontinued as he does not want insulin .  He does report his hemorrhoids are not being controlled with the Preparation H.  Discussed with him that we would contact pharmacy to see what other options are available.  He reports no other concerns  at present.    Principal Problem: Alcohol  use disorder, severe, dependence (HCC) Diagnosis: Principal Problem:   Alcohol  use disorder, severe, dependence (HCC) Active Problems:   MDD (major depressive disorder), recurrent severe, without psychosis (HCC)   Polysubstance abuse (HCC)  Total Time spent with patient:  I personally spent 35 minutes on the unit in direct patient care. The direct patient care time included face-to-face time with the patient, reviewing the patient's chart, communicating with other professionals, and coordinating care. Greater than 50% of this time was spent in counseling or coordinating care with the patient regarding goals of hospitalization, psycho-education, and discharge planning needs.   Past Psychiatric History:  Depression, Anxiety, and Polysubstance Abuse (EtOH, Cocaine), 1 Suicide Attempt (OD and held gun to head- 11/2013), and ~5 Prior Psychiatric Hospitalizations ([place]  few years ago), and no history of Self Injurious Behavior.   Past Medical History:  Past Medical History:  Diagnosis Date   Anxiety    Arthritis    Asthma    Back pain    Depression    GERD (gastroesophageal reflux disease)    Heart murmur    aortic sclerosis on Echo   Hemorrhoid    Hypertension    Insomnia    OSA (obstructive sleep apnea)    wears cpap    Past Surgical History:  Procedure Laterality Date   ANTERIOR CERVICAL DECOMP/DISCECTOMY FUSION N/A 09/24/2019   Procedure: ANTERIOR CERVICAL DECOMPRESSION/DISCECTOMY FUSION C5-6;  Surgeon: Mort Ards, MD;  Location: [facility] OR;  Service: Orthopedics;  Laterality: N/A;  3.5 hrs   ANTERIOR LAT LUMBAR FUSION N/A 01/29/2014   Procedure: X LIF L2-L3 WITH LATERAL PLATES -ANTERIOR LATERAL LUMBAR FUSION 1 LEVEL;  Surgeon: Mort Ards, MD;  Location: [facility] OR;  Service: Orthopedics;  Laterality: N/A;   BACK SURGERY  1998,2008   L3 L4 L5 fusion   5 total on spine   HEMORRHOID SURGERY     I & D EXTREMITY Right 09/07/2014    Procedure: DEBRIDEMENT RIGHT HAND EXTENSOR TENOSYNOVECTOMY;  Surgeon: Sheryl Donna, MD;  Location: [facility];  Service: Orthopedics;  Laterality: Right;   I & D EXTREMITY Right 12/14/2014   Procedure: IRRIGATION AND DEBRIDEMENT EXTREMITY RIGHT HAND;  Surgeon: Rober Chimera, MD;  Location: [facility];  Service: Orthopedics;  Laterality: Right;  RRIGATION AND DEBRIDEMENT EXTREMITY RIGHT HAND   LUMBAR LAMINECTOMY/DECOMPRESSION MICRODISCECTOMY Left 10/15/2013   Procedure: LUMBAR 2-3 LEFT DISCECTOMY;  Surgeon: Mort Ards, MD;  Location: [facility] OR;  Service: Orthopedics;  Laterality: Left;   LUMBAR SPINE SURGERY  10/15/2013   L 2 L3  DISECTOMY   NM MYOVIEW  LTD  10/2018   NORMAL study. EF > 65% (Hyperdynamic). NO ST segment changes with exercise (Neg EKG ST) with NO ISCHEMIA OR INFARCTION.   SPINE SURGERY     TONSILLECTOMY     TOTAL HIP ARTHROPLASTY Left 10/14/2020   Procedure: TOTAL HIP ARTHROPLASTY ANTERIOR APPROACH;  Surgeon: Adonica Hoose, MD;  Location: [facility];  Service: Orthopedics;  Laterality: Left;   TRANSTHORACIC ECHOCARDIOGRAM  07/2019   EF 55 to 60%.  Normal LV size and function.  No LVH.  GR 1 DD but normal LA size.  Normal RV size and function.  Trivial pericardial effusion.  Mild to moderate AOV calcification/porosis but no stenosis.  Normal mitral and tricuspid valves.  Normal right ventricular pressures and right atrial pressure.   Family History:  Family History  Problem Relation Age of Onset   Cancer Mother        breast   Hyperlipidemia Father    CAD Father 12       Had stents placed for angina.   Diabetes Father    Kidney Stones Father    Other Brother        Suspected suicide   Family Psychiatric  History:  Paternal Grandfather- EtOH Abuse Maternal Grandmother- Unknown diagnosis' Multiple Cousins Paternal Side- EtOH Abuse No Known Suicides  Social History:  Social History   Substance and Sexual Activity  Alcohol  Use Yes    Comment: 2-3 beers a week several times a week     Social History   Substance and Sexual Activity  Drug Use Yes   Types: Marijuana, Cocaine   Comment: CBD edibles    Social History   Socioeconomic History   Marital status: Divorced    Spouse name: Not on file   Number of children: 1   Years of education: Not on file   Highest education level: Not on file  Occupational History   Not on file  Tobacco Use   Smoking status: Every Day    Current packs/day: 0.50    Average packs/day: 0.5 packs/day for 18.0 years (9.0 ttl pk-yrs)    Types: Cigarettes   Smokeless tobacco: Never   Tobacco comments:    cutting back  Vaping Use   Vaping status: Every Day   Substances: Nicotine , Flavoring  Substance and Sexual Activity   Alcohol  use: Yes  Comment: 2-3 beers a week several times a week   Drug use: Yes    Types: Marijuana, Cocaine    Comment: CBD edibles   Sexual activity: Not Currently    Birth control/protection: None  Other Topics Concern   Not on file  Social History Narrative   He is now on disability because of chronic back pain.  His son does not really come around very much and he is divorced so he does not want.   Not very active.  Trying to quit smoking.         Apparently he was turned down for recurrent disability a is trying to get additional coverage for medication coverage.  (Social Security insurance? coverage)   Social Drivers of Health   Financial Resource Strain: Not on file  Food Insecurity: Food Insecurity Present (02/18/2024)   Hunger Vital Sign    Worried About Running Out of Food in the Last Year: Often true    Ran Out of Food in the Last Year: Often true  Transportation Needs: No Transportation Needs (02/18/2024)   PRAPARE - Administrator, Civil Service (Medical): No    Lack of Transportation (Non-Medical): No  Physical Activity: Not on file  Stress: Not on file  Social Connections: Not on file   Additional Social History:                          Sleep: Fair   Appetite:  Good  Current Medications: Current Facility-Administered Medications  Medication Dose Route Frequency Provider Last Rate Last Admin   albuterol  (VENTOLIN  HFA) 108 (90 Base) MCG/ACT inhaler 1 puff  1 puff Inhalation Q6H PRN Michel Agreste, Josephine C, NP       alum & mag hydroxide-simeth (MAALOX/MYLANTA) 200-200-20 MG/5ML suspension 30 mL  30 mL Oral Q4H PRN Onuoha, Josephine C, NP       amLODipine  (NORVASC ) tablet 5 mg  5 mg Oral Daily Attiah, Nadir, MD   5 mg at 02/23/24 0756   dexlansoprazole  (DEXILANT ) capsule 60 mg  60 mg Oral Q0600 Smiley Dung, RPH   60 mg at 02/23/24 0605   haloperidol  (HALDOL ) tablet 5 mg  5 mg Oral TID PRN Onuoha, Josephine C, NP   5 mg at 02/22/24 2343   And   diphenhydrAMINE  (BENADRYL ) capsule 50 mg  50 mg Oral TID PRN Onuoha, Josephine C, NP   50 mg at 02/22/24 2343   haloperidol  lactate (HALDOL ) injection 5 mg  5 mg Intramuscular TID PRN Onuoha, Josephine C, NP       And   diphenhydrAMINE  (BENADRYL ) injection 50 mg  50 mg Intramuscular TID PRN Onuoha, Josephine C, NP       And   LORazepam  (ATIVAN ) injection 2 mg  2 mg Intramuscular TID PRN Onuoha, Josephine C, NP       haloperidol  lactate (HALDOL ) injection 10 mg  10 mg Intramuscular TID PRN Onuoha, Josephine C, NP       And   diphenhydrAMINE  (BENADRYL ) injection 50 mg  50 mg Intramuscular TID PRN Onuoha, Josephine C, NP       And   LORazepam  (ATIVAN ) injection 2 mg  2 mg Intramuscular TID PRN Onuoha, Josephine C, NP       fluticasone  furoate-vilanterol (BREO ELLIPTA ) 200-25 MCG/ACT 1 puff  1 puff Inhalation Daily Onuoha, Josephine C, NP   1 puff at 02/23/24 0756   folic acid  (FOLVITE ) tablet 1 mg  1 mg  Oral Daily Onuoha, Josephine C, NP   1 mg at 02/23/24 0800   hydrOXYzine  (ATARAX ) tablet 50 mg  50 mg Oral TID PRN Alver Jobs, MD   50 mg at 02/23/24 0438   ibuprofen  (ADVIL ) tablet 600 mg  600 mg Oral Q6H PRN Adaleen Hulgan S, MD   600 mg at 02/23/24 1004    lisinopril  (ZESTRIL ) tablet 40 mg  40 mg Oral Daily Zouev, Dmitri, MD   40 mg at 02/23/24 0757   metFORMIN  (GLUCOPHAGE -XR) 24 hr tablet 500 mg  500 mg Oral BID WC Onuoha, Josephine C, NP   500 mg at 02/23/24 0757   metoprolol  succinate (TOPROL -XL) 24 hr tablet 100 mg  100 mg Oral Daily Onuoha, Josephine C, NP   100 mg at 02/23/24 4098   multivitamin with minerals tablet 1 tablet  1 tablet Oral Daily Onuoha, Josephine C, NP   1 tablet at 02/23/24 0758   nicotine  (NICODERM CQ  - dosed in mg/24 hours) patch 14 mg  14 mg Transdermal Daily Onuoha, Chinwendu V, NP   14 mg at 02/19/24 1191   phenylephrine -shark liver oil-mineral oil-petrolatum  (PREPARATION H) rectal ointment 1 Application  1 Application Rectal BID PRN Arisha Gervais S, MD   1 Application at 02/22/24 1428   QUEtiapine  (SEROQUEL ) tablet 200 mg  200 mg Oral QHS Linnie Riches, Nadir, MD   200 mg at 02/22/24 2122   thiamine  (Vitamin B-1) tablet 100 mg  100 mg Oral Daily Onuoha, Josephine C, NP   100 mg at 02/23/24 4782   Or   thiamine  (VITAMIN B1) injection 100 mg  100 mg Intravenous Daily Onuoha, Josephine C, NP       venlafaxine  XR (EFFEXOR -XR) 24 hr capsule 75 mg  75 mg Oral Q breakfast Linnie Riches, Nadir, MD   75 mg at 02/23/24 0757    Lab Results:  Results for orders placed or performed during the hospital encounter of 02/18/24 (from the past 48 hours)  Glucose, capillary     Status: Abnormal   Collection Time: 02/21/24  5:02 PM  Result Value Ref Range   Glucose-Capillary 113 (H) 70 - 99 mg/dL    Comment: Glucose reference range applies only to samples taken after fasting for at least 8 hours.  Glucose, capillary     Status: Abnormal   Collection Time: 02/21/24  7:42 PM  Result Value Ref Range   Glucose-Capillary 129 (H) 70 - 99 mg/dL    Comment: Glucose reference range applies only to samples taken after fasting for at least 8 hours.   Comment 1 Notify RN   Glucose, capillary     Status: Abnormal   Collection Time: 02/22/24  6:02 AM   Result Value Ref Range   Glucose-Capillary 111 (H) 70 - 99 mg/dL    Comment: Glucose reference range applies only to samples taken after fasting for at least 8 hours.   Comment 1 Notify RN    Comment 2 Document in Chart   Glucose, capillary     Status: Abnormal   Collection Time: 02/22/24 11:42 AM  Result Value Ref Range   Glucose-Capillary 120 (H) 70 - 99 mg/dL    Comment: Glucose reference range applies only to samples taken after fasting for at least 8 hours.  Glucose, capillary     Status: Abnormal   Collection Time: 02/22/24  5:19 PM  Result Value Ref Range   Glucose-Capillary 136 (H) 70 - 99 mg/dL    Comment: Glucose reference range applies only to samples  taken after fasting for at least 8 hours.  Glucose, capillary     Status: Abnormal   Collection Time: 02/22/24  9:08 PM  Result Value Ref Range   Glucose-Capillary 125 (H) 70 - 99 mg/dL    Comment: Glucose reference range applies only to samples taken after fasting for at least 8 hours.  Glucose, capillary     Status: None   Collection Time: 02/23/24 11:46 AM  Result Value Ref Range   Glucose-Capillary 84 70 - 99 mg/dL    Comment: Glucose reference range applies only to samples taken after fasting for at least 8 hours.    Blood Alcohol  level:  Lab Results  Component Value Date   ETH 246 (H) 02/17/2024   ETH 286 (H) 08/28/2022    Metabolic Disorder Labs: Lab Results  Component Value Date   HGBA1C 6.9 (H) 01/11/2024   MPG 154.2 09/25/2019   No results found for: "PROLACTIN" Lab Results  Component Value Date   CHOL 232 (H) 02/20/2024   TRIG 247 (H) 02/20/2024   HDL 51 02/20/2024   CHOLHDL 4.5 02/20/2024   VLDL 49 (H) 02/20/2024   LDLCALC 132 (H) 02/20/2024   LDLCALC 136 (H) 06/27/2023    Physical Findings: AIMS:  , ,  ,  ,    CIWA:  CIWA-Ar Total: 5 COWS:     Musculoskeletal: Strength & Muscle Tone: within normal limits Gait & Station: unsteady Patient leans: N/A  Psychiatric Specialty  Exam:  Presentation  General Appearance:  Casual  Eye Contact: Fair  Speech: Clear and Coherent; Normal Rate  Speech Volume: Normal  Handedness:No data recorded  Mood and Affect  Mood: Dysphoric (due to funeral tomorrow, appropriate)  Affect: Congruent   Thought Process  Thought Processes: Coherent; Goal Directed  Descriptions of Associations:Intact  Orientation:Full (Time, Place and Person)  Thought Content:Logical; WDL  History of Schizophrenia/Schizoaffective disorder:No  Duration of Psychotic Symptoms:No data recorded Hallucinations:Hallucinations: None   Ideas of Reference:None  Suicidal Thoughts:Suicidal Thoughts: No   Homicidal Thoughts:Homicidal Thoughts: No    Sensorium  Memory: Immediate Fair; Recent Fair  Judgment: Fair  Insight: Fair   Art therapist  Concentration: Good  Attention Span: Good  Recall: Good  Fund of Knowledge: Good  Language: Good   Psychomotor Activity  Psychomotor Activity: Psychomotor Activity: Normal    Assets  Assets: Communication Skills; Resilience; Desire for Improvement   Sleep  Sleep: Sleep: Fair Number of Hours of Sleep: 4.75     Physical Exam: Physical Exam Vitals and nursing note reviewed.  Constitutional:      General: He is not in acute distress.    Appearance: Normal appearance. He is obese. He is not ill-appearing or toxic-appearing.  HENT:     Head: Normocephalic and atraumatic.  Pulmonary:     Effort: Pulmonary effort is normal.  Neurological:     General: No focal deficit present.     Mental Status: He is alert.    Review of Systems  Respiratory:  Negative for cough and shortness of breath.   Cardiovascular:  Negative for chest pain.  Gastrointestinal:  Negative for abdominal pain, constipation, diarrhea, nausea and vomiting.  Neurological:  Positive for headaches. Negative for dizziness and weakness.  Psychiatric/Behavioral:  Positive for  depression. Negative for hallucinations and suicidal ideas. The patient is nervous/anxious.    Blood pressure (!) 148/85, pulse 72, temperature (!) 97.5 F (36.4 C), temperature source Oral, resp. rate 18, height 5\' 6"  (1.676 m), weight 115.7 kg, SpO2 98%. Body  mass index is 41.16 kg/m.   Treatment Plan Summary: Daily contact with patient to assess and evaluate symptoms and progress in treatment and Medication management  Olufemi Mofield is a 53 yr old male who presented on 5/11 to [facility] due to [facility] and substance abuse, he was admitted to [facility] on 5/13. PPHx is significant for Depression, Anxiety, and Polysubstance Abuse (EtOH, Cocaine), 1 Suicide Attempt (OD and held gun to head- 11/2013), and ~5 Prior Psychiatric Hospitalizations ([place]  few years ago), and no history of Self Injurious Behavior.    Beauregard is showing what appears to be appropriate levels of sadness given the impending funeral of his friend's mother tomorrow.  We will increase his Effexor  this morning.  His blood pressure does continue to be an issue but we will increase the lisinopril  to his actual home dose which is 40 mg daily.  He will follow up with his PCP about his elevated lipid panel and TSH.  We have contacted pharmacy for alternatives to Preparation H as his hemorrhoids are continuing to be symptomatic.  We will stop the insulin  orders for the sliding scale as he is not interested in insulin  and only wants to continue treatment with metformin .  We will not make any other changes to his medication at this time.  We will continue to monitor.   MDD, Recurrent, Severe, w/out Psychosis  GAD: -Increase Effexor  to 75 mg today for depression and anxiety -Continue Seroquel  200 mg QHS for augmentation and sleep -Continue Agitation Protocol: Haldol /Ativan /Benadryl      Withdrawal: -Continue CIWA, last score= 4  @ 2200  5/14 -Finish Ativan  0.5 mg BID for 3 doses this morning -Continue Thiamine  100 mg daily for nutritional  supplementation -Continue Folic Acid  1 mg daily for nutritional supplementation -Continue Multivitamin daily for nutritional supplementation     Asthma: -Continue Albuterol  1-2 puffs q6 PRN  -Continue Breo Ellipta  200-25 1 puff daily     HTN: -Continue Amlodipine  5 mg daily -Increase Lisinopril  to 40 mg daily -Continue Metoprolol  100 mg daily     Diabetes: -Continue Metformin  500 mg BID -Continue glucose monitoring -Stop sliding scale insulin  per patient's request    Hemorrhoids: -Continue Preparation H BID PRN   -Continue home Dexilant  60 mg daily  -Continue PRN's: Maalox, Advil     --  The risks/benefits/side-effects/alternatives to medications were discussed in detail with the patient and time was given for questions. The patient consents to medication trials.                -- Metabolic profile and EKG monitoring obtained while on an atypical antipsychotic (BMI: Lipid Panel: HbgA1c: QTc:)              -- Encouraged patient to participate in unit milieu and in scheduled group therapies              Safety and Monitoring:             -- Voluntary admission to inpatient psychiatric unit for safety, stabilization and treatment             -- Daily contact with patient to assess and evaluate symptoms and progress in treatment             -- Patient's case to be discussed in multi-disciplinary team meeting             -- Observation Level : q15 minute checks             -- Vital signs:  q12 hours             -- Precautions: suicide, elopement, and assault  Discharge Planning:              -- Social work and case management to assist with discharge planning and identification of hospital follow-up needs prior to discharge             -- Estimated LOS: 3-5 more days             -- Discharge Concerns: Need to establish a safety plan; Medication compliance and effectiveness             -- Discharge Goals: Return home with outpatient referrals for mental health follow-up  including medication management/psychotherapy   Basilia Bosworth, MD 02/23/2024, 1:55 PM

## 2024-02-23 NOTE — Progress Notes (Signed)
   02/22/24 2000  Psych Admission Type (Psych Patients Only)  Admission Status Voluntary  Psychosocial Assessment  Patient Complaints Anxiety;Depression (anxiety 6/10, depression 2/10)  Eye Contact Fair  Facial Expression Anxious  Affect Angry  Speech Logical/coherent  Interaction Assertive  Motor Activity Slow  Appearance/Hygiene Unremarkable  Behavior Characteristics Cooperative  Mood Anxious  Thought Process  Coherency WDL  Content Blaming others  Delusions None reported or observed  Perception WDL  Hallucination None reported or observed  Judgment Impaired  Confusion None  Danger to Self  Current suicidal ideation? Denies  Agreement Not to Harm Self Yes  Description of Agreement verbal  Danger to Others  Danger to Others None reported or observed

## 2024-02-23 NOTE — Group Note (Signed)
 LCSW Group Therapy Note   Type of Therapy and Topic:  Group Therapy - Safety  Participation Level:  Active   Description of Group This process group involved patients discussing the situations or people in their lives that frequently make them safe or unsafe.  Anxiety was a common factor among all group participants and many of them described home situations that keep them on edge and not able to feel completely safe.  Three questions were addressed during the group:  (1) What makes you feel safe (or unsafe)?  (2) Do you feel safe with yourself and why?  (3) If you don't feel safe, what can you do?  A lengthy discussion ensued in which group members empathized with each other, gave suggestions to one another, and expressed their feelings freely.  Therapeutic Goals Patient will describe what makes them feel safe or unsafe in their everyday lives. Patient will think about and discuss whether they feel safe with themselves and what reasons might contribute to feeling safe or unsafe. Patients will participate in planning for what can be done to help themselves feel safer.   Summary of Patient Progress:  The patient was late and engage with group. The patient share that his safety is " stay away from triggers, delete all bad contacts. [name] out with good people and attend church and counseling."   Therapeutic Modalities Cognitive Behavioral Therapy   Bryan Huffman, LCSWA 02/23/2024  4:42 PM

## 2024-02-23 NOTE — Plan of Care (Signed)
   Problem: Education: Goal: Emotional status will improve Outcome: Progressing Goal: Mental status will improve Outcome: Progressing   Problem: Activity: Goal: Interest or engagement in activities will improve Outcome: Progressing Goal: Sleeping patterns will improve Outcome: Progressing   Problem: Safety: Goal: Periods of time without injury will increase Outcome: Progressing

## 2024-02-23 NOTE — Group Note (Signed)
 Date:  02/23/2024 Time:  11:02 AM  Group Topic/Focus:  Emotional Education:   The focus of this group is to discuss what feelings/emotions are, and how they are experienced.    Participation Level:  Active  Bryan Huffman [name] 02/23/2024, 11:02 AM

## 2024-02-24 DIAGNOSIS — F411 Generalized anxiety disorder: Secondary | ICD-10-CM | POA: Insufficient documentation

## 2024-02-24 LAB — GLUCOSE, CAPILLARY: Glucose-Capillary: 121 mg/dL — ABNORMAL HIGH (ref 70–99)

## 2024-02-24 MED ORDER — QUETIAPINE FUMARATE 200 MG PO TABS: 200.0000 mg | ORAL_TABLET | Freq: Every day | ORAL | 0 refills | Status: DC

## 2024-02-24 MED ORDER — ADULT MULTIVITAMIN W/MINERALS CH: 1.0000 | ORAL_TABLET | Freq: Every day | ORAL | Status: AC

## 2024-02-24 MED ORDER — HYDROXYZINE HCL 50 MG PO TABS: 50.0000 mg | ORAL_TABLET | Freq: Three times a day (TID) | ORAL | 0 refills | Status: AC | PRN

## 2024-02-24 MED ORDER — VENLAFAXINE HCL ER 75 MG PO CP24: 75.0000 mg | ORAL_CAPSULE | Freq: Every day | ORAL | 0 refills | Status: DC

## 2024-02-24 MED ORDER — VENLAFAXINE HCL ER 75 MG PO CP24: 75.0000 mg | ORAL_CAPSULE | Freq: Every day | ORAL | 0 refills | Status: AC

## 2024-02-24 MED ORDER — NICOTINE 14 MG/24HR TD PT24: 14.0000 mg | MEDICATED_PATCH | Freq: Every day | TRANSDERMAL | 0 refills | Status: AC

## 2024-02-24 NOTE — Progress Notes (Signed)
   02/23/24 2000  Psych Admission Type (Psych Patients Only)  Admission Status Voluntary  Psychosocial Assessment  Patient Complaints Anxiety  Eye Contact Fair  Facial Expression Anxious  Affect Angry  Speech Logical/coherent  Interaction Assertive  Motor Activity Slow  Appearance/Hygiene Unremarkable  Behavior Characteristics Cooperative  Mood Anxious  Thought Process  Coherency WDL  Content Blaming others  Delusions None reported or observed  Perception WDL  Hallucination None reported or observed  Judgment Impaired  Confusion None  Danger to Self  Current suicidal ideation? Denies  Agreement Not to Harm Self Yes  Description of Agreement verbal  Danger to Others  Danger to Others None reported or observed

## 2024-02-24 NOTE — Progress Notes (Signed)
 Patient discharged from [facility] on 02/24/24 at 1000. Patient denies SI, plan, and intention. Suicide safety plan completed, reviewed with this RN, given to the patient, and a copy in the chart. Patient denies HI/AVH upon discharge. Patient is alert, oriented, and cooperative. RN provided patient with discharge paperwork and reviewed information with patient. Patient expressed that he understood all of the discharge instructions. Pt was satisfied with belongings returned to him from the locker and at bedside. Discharged patient to [facility] waiting room. Pt's father awaiting patient in the lobby.

## 2024-02-24 NOTE — [facility] Suicide Risk Assessment (Signed)
 [facility] Discharge Suicide Risk Assessment   Principal Problem: MDD (major depressive disorder), recurrent severe, without psychosis (HCC) Discharge Diagnoses: Principal Problem:   MDD (major depressive disorder), recurrent severe, without psychosis (HCC) Active Problems:   Polysubstance abuse (HCC)   Alcohol  use disorder, severe, dependence (HCC)   GAD (generalized anxiety disorder)  During the patient's hospitalization, patient had extensive initial psychiatric evaluation, and follow-up psychiatric evaluations every day.  Psychiatric diagnoses provided upon initial assessment: MDD, Recurrent, Severe, w/out Psychosis, Polysubstance Abuse  Patient's psychiatric medications were adjusted on admission: Started on Effexor .  During the hospitalization, other adjustments were made to the patient's psychiatric medication regimen: His Effexor  was titrated.  Gradually, patient started adjusting to milieu.   Patient's care was discussed during the interdisciplinary team meeting every day during the hospitalization.  The patient is not having side effects to prescribed psychiatric medication.  The patient reports their target psychiatric symptoms of depression and SI responded well to the psychiatric medications, and the patient reports overall benefit other psychiatric hospitalization. Supportive psychotherapy was provided to the patient. The patient also participated in regular group therapy while admitted.   Labs were reviewed with the patient, and abnormal results were discussed with the patient.  The patient denied having suicidal thoughts more than 48 hours prior to discharge.  Patient denies having homicidal thoughts.  Patient denies having auditory hallucinations.  Patient denies any visual hallucinations.  Patient denies having paranoid thoughts.  The patient is able to verbalize their individual safety plan to this provider.  It is recommended to the patient to continue psychiatric  medications as prescribed, after discharge from the hospital.    It is recommended to the patient to follow up with your outpatient psychiatric provider and PCP.  Discussed with the patient, the impact of alcohol , drugs, tobacco have been there overall psychiatric and medical wellbeing, and total abstinence from substance use was recommended the patient.  Total Time spent with patient: 20 minutes  Musculoskeletal: Strength & Muscle Tone: within normal limits Gait & Station: unsteady Patient leans: N/A  Psychiatric Specialty Exam  Presentation  General Appearance:  Casual  Eye Contact: Fair  Speech: Clear and Coherent; Normal Rate  Speech Volume: Normal  Handedness:No data recorded  Mood and Affect  Mood: Dysphoric (due to funeral tomorrow, appropriate)  Duration of Depression Symptoms: Greater than two weeks  Affect: Congruent; Appropriate   Thought Process  Thought Processes: Coherent; Goal Directed  Descriptions of Associations:Intact  Orientation:Full (Time, Place and Person)  Thought Content:Logical; WDL  History of Schizophrenia/Schizoaffective disorder:No  Duration of Psychotic Symptoms:No data recorded Hallucinations:Hallucinations: None  Ideas of Reference:None  Suicidal Thoughts:Suicidal Thoughts: No  Homicidal Thoughts:Homicidal Thoughts: No   Sensorium  Memory: Immediate Fair; Recent Fair  Judgment: Fair  Insight: Fair   Art therapist  Concentration: Good  Attention Span: Good  Recall: Good  Fund of Knowledge: Good  Language: Good   Psychomotor Activity  Psychomotor Activity: Psychomotor Activity: Normal   Assets  Assets: Communication Skills; Desire for Improvement; Resilience   Sleep  Sleep: Sleep: Poor (due to chronic pain and funeral) Number of Hours of Sleep: 1.75 (due to chronic pain and funeral)   Physical Exam: Physical Exam Vitals and nursing note reviewed.  Constitutional:       General: He is not in acute distress.    Appearance: Normal appearance. He is obese. He is not ill-appearing or toxic-appearing.  HENT:     Head: Normocephalic and atraumatic.  Pulmonary:     Effort:  Pulmonary effort is normal.  Neurological:     Mental Status: He is alert.    Review of Systems  Respiratory:  Negative for cough and shortness of breath.   Cardiovascular:  Negative for chest pain.  Gastrointestinal:  Negative for abdominal pain, constipation, diarrhea, nausea and vomiting.  Musculoskeletal:        Chronic Pain  Neurological:  Positive for headaches. Negative for dizziness and weakness.  Psychiatric/Behavioral:  Negative for depression, hallucinations and suicidal ideas. The patient is not nervous/anxious.    Blood pressure (!) 166/92, pulse 78, temperature 97.8 F (36.6 C), temperature source Oral, resp. rate 20, height 5\' 6"  (1.676 m), weight 115.7 kg, SpO2 100%. Body mass index is 41.16 kg/m.  Mental Status Per Nursing Assessment::   On Admission:  NA  Demographic Factors:  Male, Caucasian, Low socioeconomic status, Living alone, Unemployed, and Access to firearms  Loss Factors: Legal issues and Financial problems/change in socioeconomic status  Historical Factors: Prior suicide attempts and Anniversary of important loss  Risk Reduction Factors:   Positive social support and Positive therapeutic relationship  Continued Clinical Symptoms:  More than one psychiatric diagnosis Previous Psychiatric Diagnoses and Treatments Medical Diagnoses and Treatments/Surgeries  Cognitive Features That Contribute To Risk:  None    Suicide Risk:  Mild:  No SI.  There are no identifiable plans, no associated intent, mild dysphoria and related symptoms, good self-control (both objective and subjective assessment), few other risk factors, and identifiable protective factors, including available and accessible social support.  He does have a history of prior Suicide Attempts  so there is some chronic risk present.    Follow-up Information     [facility] Go on 02/26/2024.   Why: Please go to this provider on 02/26/24 at 8:30 am for an assessment, to obtain therapy and medication management services.  They are open 24/7. Contact information: [address] [place] [address] [phone]         [facility]. Go on 02/25/2024.   Specialty: Behavioral Health Why: You have an appointment for an assessment for the substance abuse intensive outpatient program, on 02/25/24 at 2:00 pm. Contact information: [address] [place] [address] [phone]                 Plan Of Care/Follow-up recommendations:  Activity: as tolerated  Diet: heart healthy  Other: -Follow-up with your outpatient psychiatric provider -instructions on appointment date, time, and address (location) are provided to you in discharge paperwork.  -Take your psychiatric medications as prescribed at discharge - instructions are provided to you in the discharge paperwork  -Follow-up with outpatient primary care doctor and other specialists -for management of chronic medical disease, including: Elevated Lipid Panel and elevated TSH. Continued care for your diabetes and blood pressure. Chronic health issues. Chronic Pain.  -Testing: Follow-up with outpatient provider for abnormal lab results: Elevated Lipid Panel and elevated TSH.   -Recommend abstinence from alcohol , tobacco, and other illicit drug use at discharge.   -If your psychiatric symptoms recur, worsen, or if you have side effects to your psychiatric medications, call your outpatient psychiatric provider, 911, [phone] or go to the nearest emergency department.  -If suicidal thoughts recur, call your outpatient psychiatric provider, 911, [phone] or go to the nearest emergency department.   Basilia Bosworth, MD 02/24/2024, 8:54 AM

## 2024-02-24 NOTE — Progress Notes (Signed)
   02/24/24 0808  Psych Admission Type (Psych Patients Only)  Admission Status Voluntary  Psychosocial Assessment  Patient Complaints Anxiety;Depression  Eye Contact Fair  Facial Expression Anxious;Sad  Affect Flat  Speech Logical/coherent  Interaction Assertive  Motor Activity Slow  Appearance/Hygiene Disheveled  Behavior Characteristics Cooperative  Mood Depressed;Anxious  Thought Process  Coherency WDL  Content WDL  Delusions None reported or observed  Perception WDL  Hallucination None reported or observed  Judgment Impaired  Confusion None  Danger to Self  Current suicidal ideation? Denies  Agreement Not to Harm Self Yes  Description of Agreement Verbal  Danger to Others  Danger to Others None reported or observed

## 2024-02-24 NOTE — Progress Notes (Signed)
  [facility] Adult Case Management Discharge Plan :  Will you be returning to the same living situation after discharge:  Yes,  the patient will be going home At discharge, do you have transportation home?: Yes,  the patient's parent will be picking him up Do you have the ability to pay for your medications: Yes,  the patient stated that he has insurance  Release of information consent forms completed and in the chart;  Patient's signature needed at discharge.  Patient to Follow up at:  Follow-up Information     [facility] Go on 02/26/2024.   Why: Please go to this provider on 02/26/24 at 8:30 am for an assessment, to obtain therapy and medication management services.  They are open 24/7. Contact information: [address] [place] [address] [phone]         [facility]. Go on 02/25/2024.   Specialty: Behavioral Health Why: You have an appointment for an assessment for the substance abuse intensive outpatient program, on 02/25/24 at 2:00 pm. Contact information: [address] [place] [address] [phone]                 Next level of care provider has access to [facility]:no  Safety Planning and Suicide Prevention discussed: Yes,  CSW spoke to the patient's father on 02/21/2024     Has patient been referred to the Quitline?: Patient refused referral for treatment  Patient has been referred for addiction treatment: Patient refused referral for treatment; referral information given to patient at discharge.  Torrin Crihfield O Maralyn Witherell, LCSWA 02/24/2024, 9:34 AM

## 2024-02-24 NOTE — Discharge Summary (Signed)
 Physician Discharge Summary Note  Patient:  Bryan Huffman is an 53 y.o., male MRN:  [id number] DOB:  Feb 27, 1971 Patient phone:  [phone] (home)  Patient address:   [address] [place] [address],  Total Time spent with patient: 20 minutes  Date of Admission:  02/18/2024 Date of Discharge: 02/24/2024  Reason for Admission:   Bryan Huffman is a 53 yr old male who presented on 5/11 to [facility] due to [facility] and substance abuse, he was admitted to [facility] on 5/13.  PPHx is significant for Depression, Anxiety, and Polysubstance Abuse (EtOH, Cocaine), 1 Suicide Attempt (OD and held gun to head- 11/2013), and ~5 Prior Psychiatric Hospitalizations ([place]  few years ago), and no history of Self Injurious Behavior.   When asked what led to his hospitalization he reports "too much partying."  He also reports issues with depression and reports that it is due to no one coming to see him and that if wants to see anyone he has to go to them.  He reports he can't afford to go anywhere for vacation.  He reports that there have been a lot of deaths in the family and those feelings finally caught up to him.  He reports its the 9th anniversary of his brother passing.  He reports he started drinking at age 50 and that it became a problem when he was 63 or 30.  He reports drinking up to a half gallon of liquor a day.  He reports he has been to multiple residential rehab programs.  He reports his longest stretch of sobriety was 8.5 months.  He reports no history of DT's or withdrawal seizures.  Principal Problem: MDD (major depressive disorder), recurrent severe, without psychosis (HCC) Discharge Diagnoses: Principal Problem:   MDD (major depressive disorder), recurrent severe, without psychosis (HCC) Active Problems:   Polysubstance abuse (HCC)   Alcohol  use disorder, severe, dependence (HCC)   GAD (generalized anxiety disorder)   Past Psychiatric History:  Depression, Anxiety, and Polysubstance Abuse  (EtOH, Cocaine), 1 Suicide Attempt (OD and held gun to head- 11/2013), and ~5 Prior Psychiatric Hospitalizations ([place]  few years ago), and no history of Self Injurious Behavior.   Past Medical History:  Past Medical History:  Diagnosis Date   Anxiety    Arthritis    Asthma    Back pain    Depression    GERD (gastroesophageal reflux disease)    Heart murmur    aortic sclerosis on Echo   Hemorrhoid    Hypertension    Insomnia    OSA (obstructive sleep apnea)    wears cpap    Past Surgical History:  Procedure Laterality Date   ANTERIOR CERVICAL DECOMP/DISCECTOMY FUSION N/A 09/24/2019   Procedure: ANTERIOR CERVICAL DECOMPRESSION/DISCECTOMY FUSION C5-6;  Surgeon: Mort Ards, MD;  Location: [facility];  Service: Orthopedics;  Laterality: N/A;  3.5 hrs   ANTERIOR LAT LUMBAR FUSION N/A 01/29/2014   Procedure: X LIF L2-L3 WITH LATERAL PLATES -ANTERIOR LATERAL LUMBAR FUSION 1 LEVEL;  Surgeon: Mort Ards, MD;  Location: [facility];  Service: Orthopedics;  Laterality: N/A;   BACK SURGERY  1998,2008   L3 L4 L5 fusion   5 total on spine   HEMORRHOID SURGERY     I & D EXTREMITY Right 09/07/2014   Procedure: DEBRIDEMENT RIGHT HAND EXTENSOR TENOSYNOVECTOMY;  Surgeon: Sheryl Donna, MD;  Location: [facility];  Service: Orthopedics;  Laterality: Right;   I & D EXTREMITY Right 12/14/2014   Procedure: IRRIGATION AND  DEBRIDEMENT EXTREMITY RIGHT HAND;  Surgeon: Rober Chimera, MD;  Location: [facility];  Service: Orthopedics;  Laterality: Right;  RRIGATION AND DEBRIDEMENT EXTREMITY RIGHT HAND   LUMBAR LAMINECTOMY/DECOMPRESSION MICRODISCECTOMY Left 10/15/2013   Procedure: LUMBAR 2-3 LEFT DISCECTOMY;  Surgeon: Mort Ards, MD;  Location: [facility];  Service: Orthopedics;  Laterality: Left;   LUMBAR SPINE SURGERY  10/15/2013   L 2 L3  DISECTOMY   NM MYOVIEW  LTD  10/2018   NORMAL study. EF > 65% (Hyperdynamic). NO ST segment changes with exercise (Neg EKG ST) with NO ISCHEMIA  OR INFARCTION.   SPINE SURGERY     TONSILLECTOMY     TOTAL HIP ARTHROPLASTY Left 10/14/2020   Procedure: TOTAL HIP ARTHROPLASTY ANTERIOR APPROACH;  Surgeon: Adonica Hoose, MD;  Location: [facility];  Service: Orthopedics;  Laterality: Left;   TRANSTHORACIC ECHOCARDIOGRAM  07/2019   EF 55 to 60%.  Normal LV size and function.  No LVH.  GR 1 DD but normal LA size.  Normal RV size and function.  Trivial pericardial effusion.  Mild to moderate AOV calcification/porosis but no stenosis.  Normal mitral and tricuspid valves.  Normal right ventricular pressures and right atrial pressure.   Family History:  Family History  Problem Relation Age of Onset   Cancer Mother        breast   Hyperlipidemia Father    CAD Father 34       Had stents placed for angina.   Diabetes Father    Kidney Stones Father    Other Brother        Suspected suicide   Family Psychiatric  History:  Paternal Grandfather- EtOH Abuse Maternal Grandmother- Unknown diagnosis' Multiple Cousins Paternal Side- EtOH Abuse No Known Suicides   Social History:  Social History   Substance and Sexual Activity  Alcohol  Use Yes   Comment: 2-3 beers a week several times a week     Social History   Substance and Sexual Activity  Drug Use Yes   Types: Marijuana, Cocaine   Comment: CBD edibles    Social History   Socioeconomic History   Marital status: Divorced    Spouse name: Not on file   Number of children: 1   Years of education: Not on file   Highest education level: Not on file  Occupational History   Not on file  Tobacco Use   Smoking status: Every Day    Current packs/day: 0.50    Average packs/day: 0.5 packs/day for 18.0 years (9.0 ttl pk-yrs)    Types: Cigarettes   Smokeless tobacco: Never   Tobacco comments:    cutting back  Vaping Use   Vaping status: Every Day   Substances: Nicotine , Flavoring  Substance and Sexual Activity   Alcohol  use: Yes    Comment: 2-3 beers a week several times a week    Drug use: Yes    Types: Marijuana, Cocaine    Comment: CBD edibles   Sexual activity: Not Currently    Birth control/protection: None  Other Topics Concern   Not on file  Social History Narrative   He is now on disability because of chronic back pain.  His son does not really come around very much and he is divorced so he does not want.   Not very active.  Trying to quit smoking.         Apparently he was turned down for recurrent disability a is trying to get additional coverage for medication coverage.  (Social  Security insurance? coverage)   Social Drivers of Health   Financial Resource Strain: Not on file  Food Insecurity: Food Insecurity Present (02/18/2024)   Hunger Vital Sign    Worried About Running Out of Food in the Last Year: Often true    Ran Out of Food in the Last Year: Often true  Transportation Needs: No Transportation Needs (02/18/2024)   PRAPARE - Administrator, Civil Service (Medical): No    Lack of Transportation (Non-Medical): No  Physical Activity: Not on file  Stress: Not on file  Social Connections: Not on file    Hospital Course:   During the patient's hospitalization, patient had extensive initial psychiatric evaluation, and follow-up psychiatric evaluations every day.  Psychiatric diagnoses provided upon initial assessment: MDD, Recurrent, Severe, w/out Psychosis, Polysubstance Abuse   Patient's psychiatric medications were adjusted on admission: Started on Effexor .   During the hospitalization, other adjustments were made to the patient's psychiatric medication regimen: His Effexor  was titrated.   Patient's care was discussed during the interdisciplinary team meeting every day during the hospitalization.  The patient is not having side effects to prescribed psychiatric medication.  Gradually, patient started adjusting to milieu. The patient was evaluated each day by a clinical provider to ascertain response to treatment. Improvement was  noted by the patient's report of decreasing symptoms, improved sleep and appetite, affect, medication tolerance, behavior, and participation in unit programming.  Patient was asked each day to complete a self inventory noting mood, mental status, pain, new symptoms, anxiety and concerns.   Symptoms were reported as significantly decreased or resolved completely by discharge.  The patient reports that their mood is stable.  The patient denied having suicidal thoughts for more than 48 hours prior to discharge.  Patient denies having homicidal thoughts.  Patient denies having auditory hallucinations.  Patient denies any visual hallucinations or other symptoms of psychosis.  The patient was motivated to continue taking medication with a goal of continued improvement in mental health.   The patient reports their target psychiatric symptoms of depression and SI responded well to the psychiatric medications, and the patient reports overall benefit other psychiatric hospitalization. Supportive psychotherapy was provided to the patient. The patient also participated in regular group therapy while hospitalized. Coping skills, problem solving as well as relaxation therapies were also part of the unit programming.  Labs were reviewed with the patient, and abnormal results were discussed with the patient.  The patient is able to verbalize their individual safety plan to this provider.  # It is recommended to the patient to continue psychiatric medications as prescribed, after discharge from the hospital.    # It is recommended to the patient to follow up with your outpatient psychiatric provider and PCP.  # It was discussed with the patient, the impact of alcohol , drugs, tobacco have been there overall psychiatric and medical wellbeing, and total abstinence from substance use was recommended the patient.ed.  # Prescriptions provided or sent directly to preferred pharmacy at discharge. Patient agreeable to plan.  Given opportunity to ask questions. Appears to feel comfortable with discharge.    # In the event of worsening symptoms, the patient is instructed to call the crisis hotline, 911 and or go to the nearest ED for appropriate evaluation and treatment of symptoms. To follow-up with primary care provider for other medical issues, concerns and or health care needs  # Patient was discharged home with a plan to follow up as noted below.  On day of discharge he reports he is feeling ok.  He reports feeling down due to his friends mother's funeral today.  He reports having a mild headache due to his blood pressure issues.  Discussed with him the importance of following up with his PCP to address his health issues.  Discussed that his Lipid Panel and TSH were elevated and need to be followed up with.  Discussed the importance of following up with his blood pressure.  Discussed the importance of continuing to take his medication as prescribed and making his follow up psychiatric appointment.  He reported understanding and had no concerns.  He reports no SI, HI, or AVH.  He reports his sleep last night was poor due to the funeral today and his chronic pain.  He reports his appetite is fair.  He reports no side effects with his medications.  He reports no other concerns at present.  He was discharged home.   Physical Findings: AIMS: Facial and Oral Movements Muscles of Facial Expression: None Lips and Perioral Area: None Jaw: None Tongue: None,Extremity Movements Upper (arms, wrists, hands, fingers): None Lower (legs, knees, ankles, toes): None, Trunk Movements Neck, shoulders, hips: None, Global Judgements Severity of abnormal movements overall : None Incapacitation due to abnormal movements: None Patient's awareness of abnormal movements: No Awareness,    CIWA:  CIWA-Ar Total: 5 COWS:     Musculoskeletal: Strength & Muscle Tone: within normal limits Gait & Station: normal Patient leans:  N/A   Psychiatric Specialty Exam:  Presentation  General Appearance:  Casual  Eye Contact: Fair  Speech: Clear and Coherent; Normal Rate  Speech Volume: Normal  Handedness:No data recorded  Mood and Affect  Mood: Dysphoric (due to funeral tomorrow, appropriate)  Affect: Congruent; Appropriate   Thought Process  Thought Processes: Coherent; Goal Directed  Descriptions of Associations:Intact  Orientation:Full (Time, Place and Person)  Thought Content:Logical; WDL  History of Schizophrenia/Schizoaffective disorder:No  Duration of Psychotic Symptoms:No data recorded Hallucinations:Hallucinations: None  Ideas of Reference:None  Suicidal Thoughts:Suicidal Thoughts: No  Homicidal Thoughts:Homicidal Thoughts: No   Sensorium  Memory: Immediate Fair; Recent Fair  Judgment: Fair  Insight: Fair   Art therapist  Concentration: Good  Attention Span: Good  Recall: Good  Fund of Knowledge: Good  Language: Good   Psychomotor Activity  Psychomotor Activity: Psychomotor Activity: Normal   Assets  Assets: Communication Skills; Desire for Improvement; Resilience   Sleep  Sleep: Sleep: Poor (due to chronic pain and funeral) Number of Hours of Sleep: 1.75 (due to chronic pain and funeral)    Physical Exam: Physical Exam Vitals and nursing note reviewed.  Constitutional:      General: He is not in acute distress.    Appearance: Normal appearance. He is obese. He is not ill-appearing or toxic-appearing.  HENT:     Head: Normocephalic and atraumatic.  Pulmonary:     Effort: Pulmonary effort is normal.  Neurological:     Mental Status: He is alert.    Review of Systems  Respiratory:  Negative for cough and shortness of breath.   Cardiovascular:  Negative for chest pain.  Gastrointestinal:  Negative for abdominal pain, constipation, diarrhea, nausea and vomiting.  Musculoskeletal:        Chronic Pain  Neurological:  Negative  for dizziness, weakness and headaches.  Psychiatric/Behavioral:  Negative for depression, hallucinations and suicidal ideas. The patient is nervous/anxious.    Blood pressure (!) 166/92, pulse 78, temperature 97.8 F (36.6 C), temperature source Oral, resp. rate  20, height 5\' 6"  (1.676 m), weight 115.7 kg, SpO2 100%. Body mass index is 41.16 kg/m.   Social History   Tobacco Use  Smoking Status Every Day   Current packs/day: 0.50   Average packs/day: 0.5 packs/day for 18.0 years (9.0 ttl pk-yrs)   Types: Cigarettes  Smokeless Tobacco Never  Tobacco Comments   cutting back   Tobacco Cessation:  A prescription for an FDA-approved tobacco cessation medication provided at discharge   Blood Alcohol  level:  Lab Results  Component Value Date   ETH 246 (H) 02/17/2024   ETH 286 (H) 08/28/2022    Metabolic Disorder Labs:  Lab Results  Component Value Date   HGBA1C 6.9 (H) 01/11/2024   MPG 154.2 09/25/2019   No results found for: "PROLACTIN" Lab Results  Component Value Date   CHOL 232 (H) 02/20/2024   TRIG 247 (H) 02/20/2024   HDL 51 02/20/2024   CHOLHDL 4.5 02/20/2024   VLDL 49 (H) 02/20/2024   LDLCALC 132 (H) 02/20/2024   LDLCALC 136 (H) 06/27/2023    See Psychiatric Specialty Exam and Suicide Risk Assessment completed by Attending Physician prior to discharge.  Discharge destination:  Home  Is patient on multiple antipsychotic therapies at discharge:  No   Has Patient had three or more failed trials of antipsychotic monotherapy by history:  No  Recommended Plan for Multiple Antipsychotic Therapies: NA  Discharge Instructions     Diet - low sodium heart healthy   Complete by: As directed    Increase activity slowly   Complete by: As directed       Allergies as of 02/24/2024       Reactions   Bee Venom Anaphylaxis, Swelling   Chlorthalidone  Other (See Comments)   Dehydrated/sweating/ passed out   Statins Other (See Comments)   Muscle pain         Medication List     STOP taking these medications    busPIRone  10 MG tablet Commonly known as: BUSPAR    LORazepam  1 MG tablet Commonly known as: ATIVAN    testosterone  cypionate 200 MG/ML injection Commonly known as: DEPOTESTOSTERONE CYPIONATE       TAKE these medications      Indication  amLODipine  5 MG tablet Commonly known as: NORVASC  Take 1 tablet (5 mg total) by mouth daily.  Indication: High Blood Pressure   budesonide -formoterol  160-4.5 MCG/ACT inhaler Commonly known as: SYMBICORT  Inhale 2 puffs into the lungs 2 (two) times daily.  Indication: Chronic Obstructive Lung Disease   dexlansoprazole  60 MG capsule Commonly known as: Dexilant  Take 1 capsule (60 mg total) by mouth daily.  Indication: Gastroesophageal Reflux Disease   hydrOXYzine  50 MG tablet Commonly known as: ATARAX  Take 1 tablet (50 mg total) by mouth 3 (three) times daily as needed for anxiety (sleep).  Indication: Feeling Anxious   ibuprofen  200 MG tablet Commonly known as: ADVIL  Take 800 mg by mouth as needed for moderate pain (pain score 4-6) or mild pain (pain score 1-3).  Indication: Pain   Icosapent  Ethyl 0.5 g Caps Commonly known as: Vascepa  Take 2 capsules (1 g total) by mouth 2 (two) times daily. What changed: when to take this  Indication: High Amount of Triglycerides in the Blood   lisinopril  40 MG tablet Commonly known as: ZESTRIL  Take 40 mg by mouth daily.  Indication: High Blood Pressure   metFORMIN  500 MG 24 hr tablet Commonly known as: GLUCOPHAGE -XR Take 1 tablet (500 mg total) by mouth 2 (two) times daily with a  meal.  Indication: Type 2 Diabetes   metoprolol  succinate 100 MG 24 hr tablet Commonly known as: TOPROL -XL Take 1 tablet (100 mg total) by mouth daily. Take with or immediately following a meal.  Indication: High Blood Pressure   multivitamin with minerals Tabs tablet Take 1 tablet by mouth daily. Start taking on: Feb 25, 2024  Indication: Vitamin  Deficiency   nicotine  14 mg/24hr patch Commonly known as: NICODERM CQ  - dosed in mg/24 hours Place 1 patch (14 mg total) onto the skin daily. Start taking on: Feb 25, 2024  Indication: Nicotine  Addiction   QUEtiapine  200 MG tablet Commonly known as: SEROQUEL  Take 1 tablet (200 mg total) by mouth at bedtime. What changed:  medication strength how much to take  Indication: Generalized Anxiety Disorder, Trouble Sleeping, Major Depressive Disorder   venlafaxine  XR 75 MG 24 hr capsule Commonly known as: EFFEXOR -XR Take 1 capsule (75 mg total) by mouth daily with breakfast. Start taking on: Feb 25, 2024  Indication: Generalized Anxiety Disorder, Major Depressive Disorder   Ventolin  HFA 108 (90 Base) MCG/ACT inhaler Generic drug: albuterol  TAKE 2 PUFFS BY MOUTH EVERY 6 HOURS AS NEEDED FOR WHEEZE OR SHORTNESS OF BREATH  Indication: Asthma        Follow-up Information     [facility] Go on 02/26/2024.   Why: Please go to this provider on 02/26/24 at 8:30 am for an assessment, to obtain therapy and medication management services.  They are open 24/7. Contact information: [address] [place] [address] [phone]         [facility]. Go on 02/25/2024.   Specialty: Behavioral Health Why: You have an appointment for an assessment for the substance abuse intensive outpatient program, on 02/25/24 at 2:00 pm. Contact information: [address] [place] [address] [phone]                 Follow-up recommendations/Comments:   Activity: as tolerated   Diet: heart healthy   Other: -Follow-up with your outpatient psychiatric provider -instructions on appointment date, time, and address (location) are provided to you in discharge paperwork.   -Take your psychiatric medications as prescribed at discharge - instructions are provided to you in the discharge paperwork   -Follow-up with outpatient primary care doctor and other  specialists -for management of chronic medical disease, including: Elevated Lipid Panel and elevated TSH. Continued care for your diabetes and blood pressure. Chronic health issues. Chronic Pain.   -Testing: Follow-up with outpatient provider for abnormal lab results: Elevated Lipid Panel and elevated TSH.    -Recommend abstinence from alcohol , tobacco, and other illicit drug use at discharge.    -If your psychiatric symptoms recur, worsen, or if you have side effects to your psychiatric medications, call your outpatient psychiatric provider, 911, [phone] or go to the nearest emergency department.   -If suicidal thoughts recur, call your outpatient psychiatric provider, 911, [phone] or go to the nearest emergency department.  Signed: Basilia Bosworth, MD 02/24/2024, 3:01 PM

## 2024-02-24 NOTE — Plan of Care (Signed)
   Problem: Education: Goal: Emotional status will improve Outcome: Progressing Goal: Mental status will improve Outcome: Progressing   Problem: Activity: Goal: Interest or engagement in activities will improve Outcome: Progressing Goal: Sleeping patterns will improve Outcome: Progressing

## 2024-02-24 NOTE — Group Note (Signed)
 Date:  02/24/2024 Time:  9:28 AM  Group Topic/Focus:  Goals Group:   The focus of this group is to help patients establish daily goals to achieve during treatment and discuss how the patient can incorporate goal setting into their daily lives to aide in recovery. Orientation:   The focus of this group is to educate the patient on the purpose and policies of crisis stabilization and provide a format to answer questions about their admission.  The group details unit policies and expectations of patients while admitted.    Participation Level:  Did Not Attend

## 2024-02-26 ENCOUNTER — Ambulatory Visit: Admitting: Pulmonary Disease

## 2024-02-26 ENCOUNTER — Other Ambulatory Visit: Payer: Self-pay | Admitting: Nurse Practitioner

## 2024-02-26 NOTE — Telephone Encounter (Signed)
 Copied from CRM [id number]. Topic: Clinical - Medication Refill >> Feb 26, 2024 11:15 AM Emmet Harm C wrote: Medication: lisinopril  (ZESTRIL ) 40 MG tablet  Has the patient contacted their pharmacy? No (Agent: If no, request that the patient contact the pharmacy for the refill. If patient does not wish to contact the pharmacy document the reason why and proceed with request.) (Agent: If yes, when and what did the pharmacy advise?)  This is the patient's preferred pharmacy:  [id number] - [place], [place] - [address] AT [facility] [address] [place] [address] Phone: [phone] Fax: [phone]  Is this the correct pharmacy for this prescription? Yes If no, delete pharmacy and type the correct one.   Has the prescription been filled recently? No  Is the patient out of the medication? Yes  Has the patient been seen for an appointment in the last year OR does the patient have an upcoming appointment? Yes  Can we respond through MyChart? No  Agent: Please be advised that Rx refills may take up to 3 business days. We ask that you follow-up with your pharmacy.

## 2024-02-27 ENCOUNTER — Ambulatory Visit: Admitting: Nurse Practitioner

## 2024-02-27 ENCOUNTER — Encounter: Payer: Self-pay | Admitting: Nurse Practitioner

## 2024-02-27 VITALS — BP 142/94 | HR 58 | Temp 97.5°F | Ht 66.0 in | Wt 257.4 lb

## 2024-02-27 DIAGNOSIS — Z09 Encounter for follow-up examination after completed treatment for conditions other than malignant neoplasm: Secondary | ICD-10-CM

## 2024-02-27 DIAGNOSIS — I10 Essential (primary) hypertension: Secondary | ICD-10-CM | POA: Diagnosis not present

## 2024-02-27 DIAGNOSIS — F332 Major depressive disorder, recurrent severe without psychotic features: Secondary | ICD-10-CM | POA: Diagnosis not present

## 2024-02-27 DIAGNOSIS — E785 Hyperlipidemia, unspecified: Secondary | ICD-10-CM

## 2024-02-27 DIAGNOSIS — F411 Generalized anxiety disorder: Secondary | ICD-10-CM

## 2024-02-27 MED ORDER — LORAZEPAM 0.5 MG PO TABS: 0.5000 mg | ORAL_TABLET | Freq: Two times a day (BID) | ORAL | 0 refills | Status: AC | PRN

## 2024-02-27 MED ORDER — LISINOPRIL 40 MG PO TABS
40.0000 mg | ORAL_TABLET | Freq: Every day | ORAL | 1 refills | Status: DC
Start: 2024-02-27 — End: 2024-08-29

## 2024-02-27 MED ORDER — ICOSAPENT ETHYL 0.5 G PO CAPS: 1.0000 g | ORAL_CAPSULE | Freq: Two times a day (BID) | ORAL | 0 refills | Status: AC

## 2024-02-27 NOTE — Assessment & Plan Note (Signed)
 Reviewed ED note along with inpatient psychiatry evaluation and recent labs.

## 2024-02-27 NOTE — Patient Instructions (Addendum)
 Nice to see you today I have refilled your medications Follow up with me in 4 week. We will recheck your thyroid  at that point  Call  [facility]. Go on 02/26/2024.   Why: Please go to this provider on 02/26/24 at 8:30 am for an assessment, to obtain therapy and medication management services.  They are open 24/7. Contact information: [address] [place] [address] [phone]

## 2024-02-27 NOTE — Assessment & Plan Note (Signed)
 Currently on venlafaxine .  He was written a short course of lorazepam  1 mg 3 times daily as needed from the hospital that was quite beneficial.  I will reapply a prescription to lorazepam  0.5 twice daily as needed.  Patient was warned and counseled to not mix it with alcohol  as he has stopped drinking.  [facility] can continue medication if they felt this is beneficial

## 2024-02-27 NOTE — Progress Notes (Signed)
 Acute Office Visit  Subjective:     Patient ID: Bryan Huffman, male    DOB: 01-18-1971, 53 y.o.   MRN: [id number]  Chief Complaint  Patient presents with   Hospitalization Follow-up    Pt complains of doing better since ER visit. Has not drank alcohol  or smoked in a week and a half.    Medication Refill    Lisinopril  and lcosapent Ethyl. 0.5g. pt has been taking 2 tablets at morning and night    Medication Management    Pt complains that Lorazapem given at hospital in [place]. Pt states this medication has helped a lot with his anxiety. Pt is out of medication and would like refill.     HPI Patient is in today for hospital follow-up  Patient was at the hospital on 02/17/2024 for alcohol  problem.  He does have a history of alcohol  dependence and polysubstance abuse.  He had concerns with alcohol  intoxication along with suicidal ideation.  It was the anniversary of his brother's death passing 9 years ago this was causing patient to have some suicidal ideations.  Eventually would like to be with his brother.  Patient's family called the PD.  Patient was assessed by psychiatry.  After the assessment and recommended inpatient treatment for alcohol  detox and treatment of depression.  Patient was discharged on 02/24/2024 from psychiatry.  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  Of note his lipid panel was elevated along with his TSH.  Patient supposed to have follow-up with [facility] recovery services on 02/26/2024 for therapy and medication management.  Patient also had an appointment 02/25/2024 [facility] for [facility] substance abuse intensive outpatient program.  States that he has been alcohol  free and cigarette for approx 1.5 weeks  He has not followed up with [facility] yet as he is working with the latest over his DUI classes first.  He states he would not go to the  [facility] as he has been evaluated by that place before and will not go back  States that he was not sleeping well at the [facility]. States that he is sleeping well since he got home. He was having pain from the chairs and bed. He slept for a couple hours of sleep on Sunday then 12 hours  Review of Systems  Constitutional:  Negative for chills and fever.  Respiratory:  Positive for shortness of breath (improving).   Cardiovascular:  Negative for chest pain.  Gastrointestinal:  Negative for constipation and diarrhea.  Neurological:  Negative for headaches.  Psychiatric/Behavioral:  Negative for hallucinations and suicidal ideas.         Objective:    BP (!) 142/94   Pulse (!) 58   Temp (!) 97.5 F (36.4 C) (Oral)   Ht 5\' 6"  (1.676 m)   Wt 257 lb 6.4 oz (116.8 kg)   SpO2 97%   BMI 41.55 kg/m  BP Readings from Last 3 Encounters:  02/27/24 (!) 142/94  02/24/24 (!) 166/92  02/18/24 (!) 155/97   Wt Readings from Last 3 Encounters:  02/27/24 257 lb 6.4 oz (116.8 kg)  02/18/24 255 lb (115.7 kg)  02/17/24 250 lb (113.4 kg)   SpO2 Readings from Last 3 Encounters:  02/27/24 97%  02/24/24 100%  02/18/24 99%      Physical Exam Vitals and nursing note reviewed.  Constitutional:  Appearance: Normal appearance.  Cardiovascular:     Rate and Rhythm: Normal rate and regular rhythm.     Heart sounds: Normal heart sounds.  Pulmonary:     Effort: Pulmonary effort is normal.     Breath sounds: Normal breath sounds.  Abdominal:     General: Bowel sounds are normal.  Neurological:     Mental Status: He is alert.     No results found for any visits on 02/27/24.      Assessment & Plan:   Problem List Items Addressed This Visit       Cardiovascular and Mediastinum   Primary hypertension   Patient blood pressure elevated in the inpatient unit and elevated today.  Patient states lisinopril  refill provided will follow-up in 4 weeks for blood pressure recheck      Relevant  Medications   Icosapent  Ethyl (VASCEPA ) 0.5 g CAPS   lisinopril  (ZESTRIL ) 40 MG tablet     Other   MDD (major depressive disorder), recurrent severe, without psychosis (HCC) - Primary   Patient currently maintained on venlafaxine  75 mg daily along with Seroquel  200 mg nightly.  He has not followed up with outpatient behavioral health services yet gave patient information on discharge papers he is not having any HI/SI/AVH.      Relevant Medications   LORazepam  (ATIVAN ) 0.5 MG tablet   Hospital discharge follow-up   Reviewed ED note along with inpatient psychiatry evaluation and recent labs.      GAD (generalized anxiety disorder)   Currently on venlafaxine .  He was written a short course of lorazepam  1 mg 3 times daily as needed from the hospital that was quite beneficial.  I will reapply a prescription to lorazepam  0.5 twice daily as needed.  Patient was warned and counseled to not mix it with alcohol  as he has stopped drinking.  [facility] can continue medication if they felt this is beneficial      Relevant Medications   LORazepam  (ATIVAN ) 0.5 MG tablet   Other Visit Diagnoses       Hyperlipidemia, unspecified hyperlipidemia type       Relevant Medications   Icosapent  Ethyl (VASCEPA ) 0.5 g CAPS   lisinopril  (ZESTRIL ) 40 MG tablet       Meds ordered this encounter  Medications   Icosapent  Ethyl (VASCEPA ) 0.5 g CAPS    Sig: Take 2 capsules (1 g total) by mouth 2 (two) times daily.    Dispense:  240 capsule    Refill:  0    Supervising Provider:   Deri Fleet A [[id number]]   lisinopril  (ZESTRIL ) 40 MG tablet    Sig: Take 1 tablet (40 mg total) by mouth daily.    Dispense:  90 tablet    Refill:  1    Supervising Provider:   Deri Fleet A [[id number]]   LORazepam  (ATIVAN ) 0.5 MG tablet    Sig: Take 1 tablet (0.5 mg total) by mouth 2 (two) times daily as needed for anxiety.    Dispense:  10 tablet    Refill:  0    Supervising Provider:   Deri Fleet A [[id number]]    Return in about 4  weeks (around 03/26/2024) for BP recheck/ TSH/ MDD.  Margarie Shay, NP

## 2024-02-27 NOTE — Assessment & Plan Note (Signed)
 Patient currently maintained on venlafaxine  75 mg daily along with Seroquel  200 mg nightly.  He has not followed up with outpatient behavioral health services yet gave patient information on discharge papers he is not having any HI/SI/AVH.

## 2024-02-27 NOTE — Assessment & Plan Note (Signed)
 Patient blood pressure elevated in the inpatient unit and elevated today.  Patient states lisinopril  refill provided will follow-up in 4 weeks for blood pressure recheck

## 2024-03-03 DIAGNOSIS — R0600 Dyspnea, unspecified: Secondary | ICD-10-CM | POA: Diagnosis not present

## 2024-03-10 ENCOUNTER — Encounter: Payer: Self-pay | Admitting: Licensed Clinical Social Worker

## 2024-03-10 ENCOUNTER — Telehealth: Payer: Self-pay | Admitting: Licensed Clinical Social Worker

## 2024-03-11 ENCOUNTER — Encounter: Payer: Self-pay | Admitting: Student in an Organized Health Care Education/Training Program

## 2024-03-17 ENCOUNTER — Telehealth: Payer: Self-pay

## 2024-03-17 NOTE — Progress Notes (Unsigned)
 Complex Care Management Note Care Guide Note  03/17/2024 Name: Bryan Huffman MRN: [id number] DOB: [birth year]   Complex Care Management Outreach Attempts: An unsuccessful telephone outreach was attempted today to offer the patient information about available complex care management services.  Follow Up Plan:  Additional outreach attempts will be made to offer the patient complex care management information and services.   Encounter Outcome:  No Answer  [facility]  [facility], [facility] Health Care Management Assistant Direct Dial: [phone]  Fax: [phone]

## 2024-03-20 NOTE — Progress Notes (Signed)
 Complex Care Management Note Care Guide Note  03/20/2024 Name: Bryan Huffman MRN: [id number] DOB: 1970-12-30   Complex Care Management Outreach Attempts: A second unsuccessful outreach was attempted today to offer the patient with information about available complex care management services.  Follow Up Plan:  No further outreach attempts will be made at this time. We have been unable to contact the patient to offer or enroll patient in complex care management services.  Encounter Outcome:  No Answer  [facility]  [facility], [facility] Health Care Management Assistant Direct Dial: [phone]  Fax: [phone]

## 2024-03-26 ENCOUNTER — Ambulatory Visit: Admitting: Nurse Practitioner

## 2024-03-26 NOTE — Progress Notes (Deleted)
   Established Patient Office Visit  Subjective   Patient ID: Bryan Huffman, male    DOB: 04/17/1971  Age: 53 y.o. MRN: [id number]  No chief complaint on file.   HPI  MDD: Patient was last seen by me On 02/27/2024 status post hospitalization for suicidal ideation.  At that point patient was maintained on venlafaxine  75 mg daily along with Seroquel  200 mg nightly.  He used to follow-up with outpatient behavioral health but had not at that juncture.  We also gave patient a short supply of lorazepam  0.5 mg to take as needed.  He was warned to not drink alcohol  with this medication  Abnormal TSH: During hospitalization patient noticed to have an abnormal TSH  {History (Optional):23778}  ROS    Objective:     There were no vitals taken for this visit. {Vitals History (Optional):23777}  Physical Exam   No results found for any visits on 03/26/24.  {Labs (Optional):23779}  The 10-year ASCVD risk score (Arnett DK, et al., 2019) is: 16.8%    Assessment & Plan:   Problem List Items Addressed This Visit   None   No follow-ups on file.    Margarie Shay, NP

## 2024-04-04 ENCOUNTER — Telehealth: Payer: Self-pay | Admitting: Nurse Practitioner

## 2024-04-04 ENCOUNTER — Other Ambulatory Visit: Payer: Self-pay | Admitting: Nurse Practitioner

## 2024-04-04 MED ORDER — QUETIAPINE FUMARATE 200 MG PO TABS: 200.0000 mg | ORAL_TABLET | Freq: Every day | ORAL | 2 refills | Status: DC

## 2024-04-04 MED ORDER — DEXLANSOPRAZOLE 60 MG PO CPDR: 60.0000 mg | DELAYED_RELEASE_CAPSULE | Freq: Every day | ORAL | 1 refills | Status: AC

## 2024-04-04 MED ORDER — QUETIAPINE FUMARATE 200 MG PO TABS: 200.0000 mg | ORAL_TABLET | Freq: Every day | ORAL | 0 refills | Status: DC

## 2024-04-04 NOTE — Telephone Encounter (Signed)
 Copied from CRM [id number]. Topic: Clinical - Medication Refill >> Apr 04, 2024  7:41 AM Suzen RAMAN wrote: Medication: QUEtiapine  (SEROQUEL ) 200 MG tablet dexlansoprazole  (DEXILANT ) 60 MG capsule  Has the patient contacted their pharmacy? Yes  This is the patient's preferred pharmacy:  [id number] [place], [place] - [address] AT [facility] [address] [place] [address] Phone: [phone] Fax: [phone]  Is this the correct pharmacy for this prescription? Yes If no, delete pharmacy and type the correct one.   Has the prescription been filled recently? No  Is the patient out of the medication? Yes  Has the patient been seen for an appointment in the last year OR does the patient have an upcoming appointment? Yes  Can we respond through MyChart? Yes  Agent: Please be advised that Rx refills may take up to 3 business days. We ask that you follow-up with your pharmacy.

## 2024-04-09 ENCOUNTER — Telehealth: Payer: Self-pay | Admitting: Nurse Practitioner

## 2024-04-09 ENCOUNTER — Other Ambulatory Visit: Payer: Self-pay | Admitting: Nurse Practitioner

## 2024-04-09 DIAGNOSIS — E1165 Type 2 diabetes mellitus with hyperglycemia: Secondary | ICD-10-CM

## 2024-04-09 NOTE — Telephone Encounter (Unsigned)
 Copied from CRM [id number]. Topic: Clinical - Medication Refill >> Apr 09, 2024  3:55 PM Zenovia J wrote: Medication:  amLODipine  (NORVASC ) 5 MG tablet metoprolol  succinate (TOPROL -XL) 100 MG 24 hr tablet   Has the patient contacted their pharmacy? No (Agent: If no, request that the patient contact the pharmacy for the refill. If patient does not wish to contact the pharmacy document the reason why and proceed with request.) (Agent: If yes, when and what did the pharmacy advise?)  This is the patient's preferred pharmacy:  [id number] - [place], [place] - [address] AT [facility] [address] [place] [address] Phone: [phone] Fax: [phone]  Is this the correct pharmacy for this prescription? Yes If no, delete pharmacy and type the correct one.   Has the prescription been filled recently? No  Is the patient out of the medication? Yes  Has the patient been seen for an appointment in the last year OR does the patient have an upcoming appointment? Yes  Can we respond through MyChart? Yes  Agent: Please be advised that Rx refills may take up to 3 business days. We ask that you follow-up with your pharmacy.

## 2024-04-28 ENCOUNTER — Other Ambulatory Visit: Payer: Self-pay | Admitting: Nurse Practitioner

## 2024-04-28 NOTE — Telephone Encounter (Unsigned)
 Copied from CRM [id number]. Topic: Clinical - Medication Refill >> Apr 28, 2024 12:08 PM Suzen RAMAN wrote: Medication: metoprolol  succinate (TOPROL -XL) 100 MG  Has the patient contacted their pharmacy? Yes  This is the patient's preferred pharmacy:  [id number] - [place], [place] - [address] AT [facility] [address] [place] [address] Phone: [phone] Fax: [phone]  Is this the correct pharmacy for this prescription? Yes If no, delete pharmacy and type the correct one.   Has the prescription been filled recently? No  Is the patient out of the medication? Yes-w/o for 2 weeks   Has the patient been seen for an appointment in the last year OR does the patient have an upcoming appointment? Yes  Can we respond through MyChart? Yes  Agent: Please be advised that Rx refills may take up to 3 business days. We ask that you follow-up with your pharmacy.

## 2024-04-29 ENCOUNTER — Other Ambulatory Visit: Payer: Self-pay | Admitting: Nurse Practitioner

## 2024-04-29 MED ORDER — METOPROLOL SUCCINATE ER 100 MG PO TB24
100.0000 mg | ORAL_TABLET | Freq: Every day | ORAL | 1 refills | Status: AC
Start: 2024-04-29 — End: ?

## 2024-07-21 ENCOUNTER — Other Ambulatory Visit (HOSPITAL_COMMUNITY): Payer: Self-pay

## 2024-07-21 ENCOUNTER — Telehealth: Payer: Self-pay

## 2024-07-21 NOTE — Telephone Encounter (Signed)
 Pharmacy Patient Advocate Encounter   Received notification from Onbase that prior authorization for Ozempic  2 is required/requested.   Insurance verification completed.   The patient is insured through [employer].   Per test claim: The current 28 day co-pay is, $4.00.  No PA needed at this time. This test claim was processed through [facility]- copay amounts may vary at other pharmacies due to pharmacy/plan contracts, or as the patient moves through the different stages of their insurance plan.   Is patient still taking this medication?  If this is a renewal PA, patient chart notes from 01/11/24:

## 2024-07-24 ENCOUNTER — Other Ambulatory Visit (HOSPITAL_COMMUNITY): Payer: Self-pay

## 2024-07-26 ENCOUNTER — Other Ambulatory Visit: Payer: Self-pay | Admitting: Nurse Practitioner

## 2024-08-04 DIAGNOSIS — G4733 Obstructive sleep apnea (adult) (pediatric): Secondary | ICD-10-CM | POA: Diagnosis not present

## 2024-08-25 ENCOUNTER — Encounter: Payer: Self-pay | Admitting: Pharmacist

## 2024-08-25 NOTE — Progress Notes (Signed)
 Pharmacy Quality Measure Review  This patient is appearing on a report for being at risk of failing the Controlling Blood Pressure measure this calendar year.   CBP: does not meet criteria for measure closure (BP <140/90).  Last documented BP  BP Readings from Last 1 Encounters:  02/27/24 (!) 142/94    KED: does not meet criteria for measure closure (BP <140/90).  Last documented UACR: Never Last documented GFR Lab Results  Component Value Date   GFR 105.63 01/11/2024    SUPD:  does not meet criteria for measure closure  Statin: Not prescribed Lab Results  Component Value Date   LDLCALC 132 (H) 02/20/2024  The 10-year ASCVD risk score (Arnett DK, et al., 2019) is: 16.8%   2025 f/u scheduled: NO  Last PCP visit: 02/27/24 Follow up never scheduled Follow up plan documented by PCP: Patient blood pressure elevated in the inpatient unit and elevated today. Patient states lisinopril  refill provided will follow-up in 4 weeks for blood pressure recheck   Open quality measure needing address: Overdue to BP check Overdue for UACR in the setting of Diabetes Not on statin therapy in the setting of diabetes  No future appointments.

## 2024-08-26 NOTE — Progress Notes (Signed)
 Patient is overdue for a follow up for BP. Can we get him scheduled

## 2024-08-26 NOTE — Progress Notes (Signed)
 Lvm to call office

## 2024-08-29 ENCOUNTER — Other Ambulatory Visit: Payer: Self-pay | Admitting: Nurse Practitioner

## 2024-08-29 DIAGNOSIS — E1165 Type 2 diabetes mellitus with hyperglycemia: Secondary | ICD-10-CM

## 2024-08-29 DIAGNOSIS — I10 Essential (primary) hypertension: Secondary | ICD-10-CM

## 2024-08-29 MED ORDER — AMLODIPINE BESYLATE 5 MG PO TABS: 5.0000 mg | ORAL_TABLET | Freq: Every day | ORAL | 0 refills | Status: AC

## 2024-08-29 MED ORDER — LISINOPRIL 40 MG PO TABS: 40.0000 mg | ORAL_TABLET | Freq: Every day | ORAL | 0 refills | Status: AC

## 2024-08-29 MED ORDER — METFORMIN HCL ER 500 MG PO TB24: 500.0000 mg | ORAL_TABLET | Freq: Two times a day (BID) | ORAL | 0 refills | Status: AC

## 2024-08-29 NOTE — Telephone Encounter (Signed)
 Pt called to request a refill for Blood Pressure medication.  States he is scheduled for 09/03/24 but says he might not make it due to copay. $$$

## 2024-08-29 NOTE — Progress Notes (Signed)
 Called and schedule pt for bp/fu

## 2024-08-29 NOTE — Progress Notes (Signed)
 Called pt to inform him of refills for amlodipine  and lisinopril .  States he already has enough lisinopril .  Needs Metoprolol  and Metformin  refilled.

## 2024-09-01 ENCOUNTER — Other Ambulatory Visit: Payer: Self-pay | Admitting: Nurse Practitioner

## 2024-09-01 NOTE — Telephone Encounter (Unsigned)
 Copied from CRM #[id number]. Topic: Clinical - Medication Refill >> Sep 01, 2024 11:59 AM Tanazia G wrote: Medication: metoprolol  succinate (TOPROL -XL) 100 MG 24 hr tablet  Has the patient contacted their pharmacy? Yes (Agent: If no, request that the patient contact the pharmacy for the refill. If patient does not wish to contact the pharmacy document the reason why and proceed with request.) (Agent: If yes, when and what did the pharmacy advise?)  This is the patient's preferred pharmacy:  [id number] - [place], [place] - [address] AT [facility] [address] [place] [address] Phone: [phone] Fax: [phone]  Is this the correct pharmacy for this prescription? Yes If no, delete pharmacy and type the correct one.   Has the prescription been filled recently? Yes  Is the patient out of the medication? Yes  Has the patient been seen for an appointment in the last year OR does the patient have an upcoming appointment? Yes  Can we respond through MyChart? Yes  Agent: Please be advised that Rx refills may take up to 3 business days. We ask that you follow-up with your pharmacy.

## 2024-09-03 ENCOUNTER — Ambulatory Visit: Admitting: Nurse Practitioner

## 2024-10-15 ENCOUNTER — Telehealth: Payer: Self-pay | Admitting: *Deleted

## 2024-10-15 NOTE — Transitions of Care (Post Inpatient/ED Visit) (Signed)
 "  10/15/2024  Name: Bryan Huffman MRN: [id number] DOB: [birth year]  Today's TOC FU Call Status: Today's TOC FU Call Status:: Successful TOC FU Call Completed TOC FU Call Complete Date: 10/15/24  Patient's Name and Date of Birth confirmed. Name, DOB  Transition Care Management Follow-up Telephone Call Date of Discharge: 10/14/24 Discharge Facility: Other [facility]) Name of Other (Non[facility]) Discharge Facility: [facility] Type of Discharge: Inpatient Admission Primary Inpatient Discharge Diagnosis:: DKA/ Hyperkalemia How have you been since you were released from the hospital?: Better Any questions or concerns?: No  Items Reviewed: Did you receive and understand the discharge instructions provided?: Yes Medications obtained,verified, and reconciled?: Yes (Medications Reviewed) Any new allergies since your discharge?: No Dietary orders reviewed?: No Do you have support at home?: Yes People in Home [RPT]: alone Name of Support/Comfort Primary Source: father Rodgers  Medications Reviewed Today: Medications Reviewed Today     Reviewed by Kennieth Cathlean DEL, RN (Case Manager) on 10/15/24 at 1325  Med List Status: <None>   Medication Order Taking? Sig Documenting Provider Last Dose Status Informant  albuterol  (VENTOLIN  HFA) 108 (90 Base) MCG/ACT inhaler [id number]  TAKE 2 PUFFS BY MOUTH EVERY 6 HOURS AS NEEDED FOR WHEEZE OR SHORTNESS OF SHERIDA Wendee Lynwood CHRISTELLA, NP  Active Self, Pharmacy Records  amLODipine  (NORVASC ) 5 MG tablet [id number] Yes Take 1 tablet (5 mg total) by mouth daily. Wendee Lynwood CHRISTELLA, NP  Active   aspirin  EC 81 MG tablet [id number] Yes Take 81 mg by mouth daily. Swallow whole. [provider]  Active   budesonide -formoterol  (SYMBICORT ) 160-4.5 MCG/ACT inhaler [id number]  Inhale 2 puffs into the lungs 2 (two) times daily. Wendee Lynwood CHRISTELLA, NP  Active Self, Pharmacy Records  dexlansoprazole  (DEXILANT ) 60 MG capsule [id number] Yes Take 1 capsule (60 mg total) by  mouth daily. Wendee Lynwood CHRISTELLA, NP  Active   HYDROcodone -acetaminophen  (NORCO/VICODIN) 5-325 MG tablet [id number] Yes Take 1 tablet by mouth every 6 (six) hours as needed for moderate pain (pain score 4-6). as needed for up to 5 days. [provider]  Active   hydrOXYzine  (ATARAX ) 50 MG tablet [id number]  Take 1 tablet (50 mg total) by mouth 3 (three) times daily as needed for anxiety (sleep). Raliegh Marsa RAMAN, DO  Active   ibuprofen  (ADVIL ) 200 MG tablet [id number]  Take 800 mg by mouth as needed for moderate pain (pain score 4-6) or mild pain (pain score 1-3). [provider]  Active Self, Pharmacy Records  Icosapent  Ethyl (VASCEPA ) 0.5 g CAPS [id number]  Take 2 capsules (1 g total) by mouth 2 (two) times daily. Wendee Lynwood CHRISTELLA, NP  Active   insulin  glargine (LANTUS) 100 UNIT/ML injection [id number] Yes Inject 33 Units into the skin at bedtime. [provider]  Active   lisinopril  (ZESTRIL ) 40 MG tablet [id number]  Take 1 tablet (40 mg total) by mouth daily. Wendee Lynwood CHRISTELLA, NP  Active   LORazepam  (ATIVAN ) 0.5 MG tablet [id number]  Take 1 tablet (0.5 mg total) by mouth 2 (two) times daily as needed for anxiety. Wendee Lynwood CHRISTELLA, NP  Active   metFORMIN  (GLUCOPHAGE -XR) 500 MG 24 hr tablet [id number] Yes Take 1 tablet (500 mg total) by mouth 2 (two) times daily with a meal. Wendee Lynwood CHRISTELLA, NP  Active   metoprolol  succinate (TOPROL -XL) 100 MG 24 hr tablet [id number]  Take 1 tablet (100 mg total) by mouth daily. Take with or immediately following a meal.  Patient not taking: Reported on 10/15/2024   Wendee,  Lynwood HERO, NP  Active   Multiple Vitamin (MULTIVITAMIN WITH MINERALS) TABS tablet [id number] Yes Take 1 tablet by mouth daily. Raliegh Marsa RAMAN, DO  Active   naltrexone (DEPADE) 50 MG tablet [id number] Yes Take 50 mg by mouth daily. Do not start until after you have run out of the hydrocodone  pills [provider]  Active   nicotine  (NICODERM CQ  - DOSED IN MG/24 HOURS) 14 mg/24hr  patch [id number] Yes Place 1 patch (14 mg total) onto the skin daily. Raliegh Marsa RAMAN, DO  Active   QUEtiapine  (SEROQUEL ) 200 MG tablet [id number] Yes TAKE 1 TABLET BY MOUTH EVERYDAY AT BEDTIME Wendee Lynwood HERO, NP  Active   venlafaxine  XR (EFFEXOR -XR) 75 MG 24 hr capsule [id number]  Take 1 capsule (75 mg total) by mouth daily with breakfast. Raliegh Marsa RAMAN, DO  Active             Home Care and Equipment/Supplies: Were Home Health Services Ordered?: NA Any new equipment or medical supplies ordered?: NA  Functional Questionnaire: Do you need assistance with bathing/showering or dressing?: No Do you need assistance with meal preparation?: No Do you need assistance with eating?: No Do you have difficulty maintaining continence: No Do you need assistance with getting out of bed/getting out of a chair/moving?: No Do you have difficulty managing or taking your medications?: No  Follow up appointments reviewed: PCP Follow-up appointment confirmed?: Yes Date of PCP follow-up appointment?: 10/23/24 Follow-up Provider: Lynwood Sites (Per patient he is changing to [facility] in [place]. He was waiting on call back for appt.) Specialist Hospital Follow-up appointment confirmed?: NA Do you need transportation to your follow-up appointment?: No Do you understand care options if your condition(s) worsen?: Yes-patient verbalized understanding  SDOH Interventions Today    Flowsheet Row Most Recent Value  SDOH Interventions   Food Insecurity Interventions Intervention Not Indicated  Housing Interventions Intervention Not Indicated  Transportation Interventions Intervention Not Indicated  Utilities Interventions Intervention Not Indicated    Goals Addressed             This Visit's Progress    [facility]I Transitions of Care (TOC) Care Plan       Problems:  Recent Hospitalization for treatment of Diabetic ketoacidosis without coma associated with type 2 diabetes  mellitus Knowledge Deficit Related to Diabetic ketoacidosis/ Hyperkalemia  Goal:  Over the next 30 days, the patient will not experience hospital readmission  Interventions:  Transitions of Care: Doctor Visits  - discussed the importance of doctor visits Referral to Longitudinal Nurse Case Manager for Ongoing follow-up  Patient Self Care Activities:  Attend all scheduled provider appointments Call pharmacy for medication refills 3-7 days in advance of running out of medications Call provider office for new concerns or questions  Notify RN Care Manager of TOC call rescheduling needs Participate in Transition of Care Program/Attend TOC scheduled calls Perform all self care activities independently  Perform IADL's (shopping, preparing meals, housekeeping, managing finances) independently Take medications as prescribed   check blood sugar at prescribed times: before meals and at bedtime and when you have symptoms of low or high blood sugar check feet daily for cuts, sores or redness take the blood sugar log to all doctor visits set a realistic goal Abstain from alcohol , drugs  Plan:  An initial telephone outreach has been scheduled for: [id number] Next PCP appointment scheduled for: Patient has an appointment with Dr Wendee for  [phone] He is changing PCP and awaiting call back from [facility] [place].  Telephone follow up appointment with care management team member scheduled for:  [id number] Davina Green 2:15       Discussed and offered 30 day TOC program.  Patient  consented.  The patient has been provided with contact information for the care management team and has been advised to call with any health -related questions or concerns.  The patient verbalized understanding with current plan of care.  The patient is directed to their insurance card regarding availability of benefits coverage   Cathlean Headland BSN RN [facility] Health Care Management  Coordinator [email or web address] Direct Dial: [phone]  Fax: [phone] Website: [email or web address]  "

## 2024-10-22 ENCOUNTER — Telehealth: Payer: Self-pay

## 2024-10-22 NOTE — Transitions of Care (Post Inpatient/ED Visit) (Unsigned)
 " Transition of Care week 2  Visit Note  10/22/2024  Name: Bryan Huffman MRN: [id number]          DOB: June 25, 1971  Situation: Patient enrolled in [facility] 30-day program. Visit completed with patient by telephone.   Background:   Initial Transition Care Management Follow-up Telephone Call Discharge Date and Diagnosis: 10/14/24, DKA/ Hyperkalemia   Past Medical History:  Diagnosis Date   Anxiety    Arthritis    Asthma    Back pain    Depression    GERD (gastroesophageal reflux disease)    Heart murmur    aortic sclerosis on Echo   Hemorrhoid    Hypertension    Insomnia    OSA (obstructive sleep apnea)    wears cpap    Assessment: Patient Reported Symptoms: Cognitive Cognitive Status: No symptoms reported, Alert and oriented to person, place, and time, Insightful and able to interpret abstract concepts, Normal speech and language skills      Neurological Neurological Review of Symptoms: No symptoms reported    HEENT HEENT Symptoms Reported: No symptoms reported      Cardiovascular Cardiovascular Symptoms Reported: No symptoms reported Cardiovascular Comment: patient reports seeing his new primary care provider on 10/16/24. He states his provider increased his amlodipine .  Respiratory Respiratory Symptoms Reported: Shortness of breath Additional Respiratory Details: patient reports SOB with exertion. He states he contributes this to his weight. Patient states he is starting to work on exercise. Respiratory Management Strategies: Routine screening  Endocrine Endocrine Symptoms Reported: No symptoms reported Is patient diabetic?: Yes Is patient checking blood sugars at home?: Yes List most recent blood sugar readings, include date and time of day: patient reports his fasting blood sugar today was 172. He reports checking his blood sugars daily.  Patient reports having an appointment with his new primary care provider on 10/16/24 with upcoming visit 11/2024.    Gastrointestinal  Gastrointestinal Symptoms Reported: No symptoms reported      Genitourinary Genitourinary Symptoms Reported: No symptoms reported    Integumentary Integumentary Symptoms Reported: No symptoms reported    Musculoskeletal Musculoskelatal Symptoms Reviewed: No symptoms reported        Psychosocial Psychosocial Symptoms Reported: Anxiety - if selected complete GAD Behavioral Management Strategies: Medication therapy Major Change/Loss/Stressor/Fears (CP): Medical condition, self     There were no vitals filed for this visit. Pain Scale: 0-10 Pain Score: 0-No pain  Medications Reviewed Today     Reviewed by Kyia Rhude E, RN (Registered Nurse) on 10/22/24 at 1454  Med List Status: <None>   Medication Order Taking? Sig Documenting Provider Last Dose Status Informant  albuterol  (VENTOLIN  HFA) 108 (90 Base) MCG/ACT inhaler [id number] Yes TAKE 2 PUFFS BY MOUTH EVERY 6 HOURS AS NEEDED FOR WHEEZE OR SHORTNESS OF SHERIDA Wendee Lynwood CHRISTELLA, NP  Active Self, Pharmacy Records  amLODipine  (NORVASC ) 5 MG tablet [id number] Yes Take 1 tablet (5 mg total) by mouth daily.  Patient taking differently: Take 5 mg by mouth daily. Patient states taking 10 mg daily   Wendee Lynwood CHRISTELLA, NP  Active   aspirin  EC 81 MG tablet [id number] Yes Take 81 mg by mouth daily. Swallow whole. [provider]  Active   budesonide -formoterol  (SYMBICORT ) 160-4.5 MCG/ACT inhaler [id number]  Inhale 2 puffs into the lungs 2 (two) times daily.  Patient not taking: Reported on 10/22/2024   Wendee Lynwood CHRISTELLA, NP  Active Self, Pharmacy Records  dexlansoprazole  (DEXILANT ) 60 MG capsule [id number] Yes Take 1 capsule (60 mg total)  by mouth daily. Wendee Lynwood HERO, NP  Active   HYDROcodone -acetaminophen  (NORCO/VICODIN) 5-325 MG tablet [id number]  Take 1 tablet by mouth every 6 (six) hours as needed for moderate pain (pain score 4-6). as needed for up to 5 days.  Patient not taking: Reported on 10/22/2024   [provider]  Active    hydrOXYzine  (ATARAX ) 50 MG tablet [id number]  Take 1 tablet (50 mg total) by mouth 3 (three) times daily as needed for anxiety (sleep).  Patient not taking: Reported on 10/22/2024   Pashayan, Alexander S, DO  Active   ibuprofen  (ADVIL ) 200 MG tablet [id number] Yes Take 800 mg by mouth as needed for moderate pain (pain score 4-6) or mild pain (pain score 1-3). [provider]  Active Self, Pharmacy Records  Icosapent  Ethyl (VASCEPA ) 0.5 g CAPS [id number] Yes Take 2 capsules (1 g total) by mouth 2 (two) times daily. Wendee Lynwood HERO, NP  Active   insulin  glargine (LANTUS) 100 UNIT/ML injection [id number] Yes Inject 33 Units into the skin at bedtime. [provider]  Active   lisinopril  (ZESTRIL ) 40 MG tablet [id number]  Take 1 tablet (40 mg total) by mouth daily.  Patient not taking: Reported on 10/22/2024   Wendee Lynwood HERO, NP  Active   LORazepam  (ATIVAN ) 0.5 MG tablet [id number]  Take 1 tablet (0.5 mg total) by mouth 2 (two) times daily as needed for anxiety.  Patient not taking: Reported on 10/22/2024   Wendee Lynwood HERO, NP  Active   metFORMIN  (GLUCOPHAGE -XR) 500 MG 24 hr tablet [id number] Yes Take 1 tablet (500 mg total) by mouth 2 (two) times daily with a meal.  Patient taking differently: Take 500 mg by mouth daily with breakfast. Patient states he takes 1 tablet daily   Wendee Lynwood HERO, NP  Active   metoprolol  succinate (TOPROL -XL) 100 MG 24 hr tablet [id number]  Take 1 tablet (100 mg total) by mouth daily. Take with or immediately following a meal.  Patient not taking: Reported on 10/22/2024   Wendee Lynwood HERO, NP  Active   Multiple Vitamin (MULTIVITAMIN WITH MINERALS) TABS tablet [id number]  Take 1 tablet by mouth daily.  Patient not taking: Reported on 10/22/2024   Pashayan, Alexander S, DO  Active   naltrexone (DEPADE) 50 MG tablet [id number]  Take 50 mg by mouth daily. Do not start until after you have run out of the hydrocodone  pills  Patient not taking: Reported on 10/22/2024    [provider]  Active   nicotine  (NICODERM CQ  - DOSED IN MG/24 HOURS) 14 mg/24hr patch [id number]  Place 1 patch (14 mg total) onto the skin daily.  Patient not taking: Reported on 10/22/2024   Raliegh Marsa RAMAN, DO  Active   QUEtiapine  (SEROQUEL ) 200 MG tablet [id number] Yes TAKE 1 TABLET BY MOUTH EVERYDAY AT BEDTIME Wendee Lynwood HERO, NP  Active   venlafaxine  XR (EFFEXOR -XR) 75 MG 24 hr capsule [id number]  Take 1 capsule (75 mg total) by mouth daily with breakfast.  Patient not taking: Reported on 10/22/2024   Pashayan, Alexander S, DO  Active             Goals Addressed             This Visit's Progress    [facility] Transitions of Care (TOC) Care Plan       Problems:  Recent Hospitalization for treatment of Diabetic ketoacidosis without coma associated with type 2 diabetes mellitus Knowledge Deficit Related to Diabetic ketoacidosis/ Hyperkalemia  Goal:  Over the next 30 days, the patient will not experience hospital readmission  Interventions:  Transitions of Care: Doctor Visits  - discussed the importance of doctor visits Referral to Longitudinal Nurse Case Manager for Ongoing follow-up Medications reviewed and compliance discussed and advised  Diabetes Rule of 15 management for low blood sugars discussed and understanding confirmed using teach back method.  Confirmed patient has glucometer and discussed appropriate use  Discussed management of hyperglycemia Educate on the importance of frequent glucose monitoring, especially during illness or stress. Assessed for recent blood sugar readings  Encourage maintaining adequate hydration daily. Review signs/symptoms of DKA and when to seek urgent care (e.g., persistent vomiting, fruity breath, rapid breathing, glucose > 300 mg/dL). Reinforce importance of attending follow-up with PCP within 1 week. SDOH assessed GAD 7 completed Discussed upcoming provider visits Patients chart update with new primary care provider.   Advised to call provider for frequent blood sugars <70 and / or >250.     Patient Self Care Activities:  Attend all scheduled provider appointments Call pharmacy for medication refills 3-7 days in advance of running out of medications Call provider office for new concerns or questions  Notify RN Care Manager of TOC call rescheduling needs Participate in Transition of Care Program/Attend TOC scheduled calls Perform all self care activities independently  Perform IADL's (shopping, preparing meals, housekeeping, managing finances) independently Take medications as prescribed   check blood sugar at prescribed times: before meals and at bedtime and when you have symptoms of low or high blood sugar check feet daily for cuts, sores or redness take the blood sugar log to all doctor visits set a realistic goal Abstain from alcohol , drugs  Plan:  An initial telephone outreach has been scheduled for: [id number] Next PCP appointment scheduled for: Patient has an appointment with Dr Wendee for  [phone] He is changing PCP and awaiting call back from [facility] [place].  Telephone follow up appointment with care management team member scheduled for:  [id number] Shellby Schlink 2:15        Recommendation:   Continue Current Plan of Care  Follow Up Plan:   Closing From:  Transitions of Care Program.  Patient no longer attributed. Patient changed to non [facility] provider.   Arvin Seip RN, BSN, CCM [employer], Population Health Case Manager Phone: [phone]     "

## 2024-10-23 ENCOUNTER — Inpatient Hospital Stay: Admitting: Nurse Practitioner

## 2024-10-26 ENCOUNTER — Other Ambulatory Visit: Payer: Self-pay | Admitting: Nurse Practitioner
